# Patient Record
Sex: Female | Born: 1980 | Race: Black or African American | Hispanic: No | Marital: Single | Smoking: Never smoker
Health system: Southern US, Community
[De-identification: ages and names within clinical notes are randomized; demographics above are authoritative.]

## PROBLEM LIST (undated history)

## (undated) DIAGNOSIS — E119 Type 2 diabetes mellitus without complications: Secondary | ICD-10-CM

## (undated) DIAGNOSIS — I509 Heart failure, unspecified: Secondary | ICD-10-CM

## (undated) DIAGNOSIS — N183 Chronic kidney disease, stage 3 unspecified: Secondary | ICD-10-CM

## (undated) DIAGNOSIS — D649 Anemia, unspecified: Secondary | ICD-10-CM

## (undated) DIAGNOSIS — I10 Essential (primary) hypertension: Secondary | ICD-10-CM

## (undated) HISTORY — PX: BREAST SURGERY: SHX581

---

## 2016-06-24 HISTORY — PX: RENAL BIOPSY: SHX156

## 2018-05-24 ENCOUNTER — Other Ambulatory Visit: Payer: Self-pay

## 2018-05-24 ENCOUNTER — Inpatient Hospital Stay (HOSPITAL_COMMUNITY)
Admission: AD | Admit: 2018-05-24 | Discharge: 2018-05-30 | DRG: 291 | Disposition: A | Discharge status: Home / Self Care | Payer: BLUE CROSS/BLUE SHIELD | Source: Other Acute Inpatient Hospital | Attending: Internal Medicine | Admitting: Internal Medicine

## 2018-05-24 ENCOUNTER — Encounter (HOSPITAL_COMMUNITY): Payer: Self-pay

## 2018-05-24 DIAGNOSIS — N269 Renal sclerosis, unspecified: Secondary | ICD-10-CM | POA: Diagnosis present

## 2018-05-24 DIAGNOSIS — Z794 Long term (current) use of insulin: Secondary | ICD-10-CM | POA: Diagnosis not present

## 2018-05-24 DIAGNOSIS — E1322 Other specified diabetes mellitus with diabetic chronic kidney disease: Secondary | ICD-10-CM | POA: Diagnosis present

## 2018-05-24 DIAGNOSIS — M069 Rheumatoid arthritis, unspecified: Secondary | ICD-10-CM | POA: Diagnosis present

## 2018-05-24 DIAGNOSIS — D631 Anemia in chronic kidney disease: Secondary | ICD-10-CM | POA: Diagnosis present

## 2018-05-24 DIAGNOSIS — I130 Hypertensive heart and chronic kidney disease with heart failure and stage 1 through stage 4 chronic kidney disease, or unspecified chronic kidney disease: Secondary | ICD-10-CM | POA: Diagnosis present

## 2018-05-24 DIAGNOSIS — Z8249 Family history of ischemic heart disease and other diseases of the circulatory system: Secondary | ICD-10-CM

## 2018-05-24 DIAGNOSIS — N184 Chronic kidney disease, stage 4 (severe): Secondary | ICD-10-CM | POA: Diagnosis present

## 2018-05-24 DIAGNOSIS — R748 Abnormal levels of other serum enzymes: Secondary | ICD-10-CM | POA: Diagnosis present

## 2018-05-24 DIAGNOSIS — E1122 Type 2 diabetes mellitus with diabetic chronic kidney disease: Secondary | ICD-10-CM | POA: Diagnosis present

## 2018-05-24 DIAGNOSIS — I428 Other cardiomyopathies: Secondary | ICD-10-CM | POA: Diagnosis present

## 2018-05-24 DIAGNOSIS — L97521 Non-pressure chronic ulcer of other part of left foot limited to breakdown of skin: Secondary | ICD-10-CM | POA: Diagnosis present

## 2018-05-24 DIAGNOSIS — D649 Anemia, unspecified: Secondary | ICD-10-CM | POA: Diagnosis present

## 2018-05-24 DIAGNOSIS — E1165 Type 2 diabetes mellitus with hyperglycemia: Secondary | ICD-10-CM | POA: Diagnosis present

## 2018-05-24 DIAGNOSIS — Z833 Family history of diabetes mellitus: Secondary | ICD-10-CM

## 2018-05-24 DIAGNOSIS — Z79899 Other long term (current) drug therapy: Secondary | ICD-10-CM

## 2018-05-24 DIAGNOSIS — I5021 Acute systolic (congestive) heart failure: Secondary | ICD-10-CM | POA: Diagnosis present

## 2018-05-24 DIAGNOSIS — E785 Hyperlipidemia, unspecified: Secondary | ICD-10-CM | POA: Diagnosis present

## 2018-05-24 DIAGNOSIS — N049 Nephrotic syndrome with unspecified morphologic changes: Secondary | ICD-10-CM | POA: Diagnosis present

## 2018-05-24 DIAGNOSIS — Z91041 Radiographic dye allergy status: Secondary | ICD-10-CM

## 2018-05-24 DIAGNOSIS — I890 Lymphedema, not elsewhere classified: Secondary | ICD-10-CM | POA: Diagnosis present

## 2018-05-24 DIAGNOSIS — N179 Acute kidney failure, unspecified: Secondary | ICD-10-CM | POA: Diagnosis present

## 2018-05-24 DIAGNOSIS — K219 Gastro-esophageal reflux disease without esophagitis: Secondary | ICD-10-CM | POA: Diagnosis present

## 2018-05-24 DIAGNOSIS — I509 Heart failure, unspecified: Secondary | ICD-10-CM

## 2018-05-24 DIAGNOSIS — IMO0002 Reserved for concepts with insufficient information to code with codable children: Secondary | ICD-10-CM | POA: Insufficient documentation

## 2018-05-24 DIAGNOSIS — Z6841 Body Mass Index (BMI) 40.0 and over, adult: Secondary | ICD-10-CM | POA: Diagnosis not present

## 2018-05-24 DIAGNOSIS — Z713 Dietary counseling and surveillance: Secondary | ICD-10-CM | POA: Diagnosis not present

## 2018-05-24 DIAGNOSIS — S92919A Unspecified fracture of unspecified toe(s), initial encounter for closed fracture: Secondary | ICD-10-CM

## 2018-05-24 DIAGNOSIS — E039 Hypothyroidism, unspecified: Secondary | ICD-10-CM | POA: Diagnosis present

## 2018-05-24 DIAGNOSIS — I129 Hypertensive chronic kidney disease with stage 1 through stage 4 chronic kidney disease, or unspecified chronic kidney disease: Secondary | ICD-10-CM

## 2018-05-24 DIAGNOSIS — E8809 Other disorders of plasma-protein metabolism, not elsewhere classified: Secondary | ICD-10-CM | POA: Diagnosis present

## 2018-05-24 DIAGNOSIS — E1365 Other specified diabetes mellitus with hyperglycemia: Secondary | ICD-10-CM

## 2018-05-24 HISTORY — DX: Chronic kidney disease, stage 3 unspecified: N18.30

## 2018-05-24 HISTORY — DX: Type 2 diabetes mellitus without complications: E11.9

## 2018-05-24 HISTORY — DX: Chronic kidney disease, stage 3 (moderate): N18.3

## 2018-05-24 HISTORY — DX: Essential (primary) hypertension: I10

## 2018-05-24 LAB — BASIC METABOLIC PANEL
Anion gap: 9 (ref 5–15)
BUN: 33 mg/dL — ABNORMAL HIGH (ref 6–20)
CO2: 18 mmol/L — ABNORMAL LOW (ref 22–32)
Calcium: 8 mg/dL — ABNORMAL LOW (ref 8.9–10.3)
Chloride: 112 mmol/L — ABNORMAL HIGH (ref 98–111)
Creatinine, Ser: 3.05 mg/dL — ABNORMAL HIGH (ref 0.44–1.00)
GFR, EST AFRICAN AMERICAN: 22 mL/min — AB (ref >60)
GFR, EST NON AFRICAN AMERICAN: 19 mL/min — AB (ref >60)
Glucose, Bld: 96 mg/dL (ref 70–99)
Potassium: 4.6 mmol/L (ref 3.5–5.1)
SODIUM: 139 mmol/L (ref 135–145)

## 2018-05-24 LAB — GLUCOSE, CAPILLARY: Glucose-Capillary: 124 mg/dL — ABNORMAL HIGH (ref 70–99)

## 2018-05-24 LAB — TSH: TSH: 5.567 u[IU]/mL — ABNORMAL HIGH (ref 0.350–4.500)

## 2018-05-24 LAB — CBC WITH DIFFERENTIAL/PLATELET
Abs Immature Granulocytes: 0.01 10*3/uL (ref 0.00–0.07)
Basophils Absolute: 0 10*3/uL (ref 0.0–0.1)
Basophils Relative: 1 %
Eosinophils Absolute: 0.5 10*3/uL (ref 0.0–0.5)
Eosinophils Relative: 8 %
HCT: 28.9 % — ABNORMAL LOW (ref 36.0–46.0)
Hemoglobin: 8.8 g/dL — ABNORMAL LOW (ref 12.0–15.0)
IMMATURE GRANULOCYTES: 0 %
Lymphocytes Relative: 36 %
Lymphs Abs: 2.1 10*3/uL (ref 0.7–4.0)
MCH: 27.8 pg (ref 26.0–34.0)
MCHC: 30.4 g/dL (ref 30.0–36.0)
MCV: 91.2 fL (ref 80.0–100.0)
Monocytes Absolute: 0.4 10*3/uL (ref 0.1–1.0)
Monocytes Relative: 7 %
NEUTROS ABS: 2.7 10*3/uL (ref 1.7–7.7)
NRBC: 0 % (ref 0.0–0.2)
Neutrophils Relative %: 48 %
Platelets: 326 10*3/uL (ref 150–400)
RBC: 3.17 MIL/uL — ABNORMAL LOW (ref 3.87–5.11)
RDW: 14.8 % (ref 11.5–15.5)
WBC: 5.7 10*3/uL (ref 4.0–10.5)

## 2018-05-24 LAB — TROPONIN I

## 2018-05-24 MED ORDER — SODIUM CHLORIDE 0.9% FLUSH: 3.0000 mL | INTRAVENOUS | Status: DC | PRN

## 2018-05-24 MED ORDER — SODIUM CHLORIDE 0.9% FLUSH
3.0000 mL | Freq: Two times a day (BID) | INTRAVENOUS | Status: DC
  Administered 2018-05-24 – 2018-05-30 (×12): 3 mL via INTRAVENOUS

## 2018-05-24 MED ORDER — FUROSEMIDE 10 MG/ML IJ SOLN
20.0000 mg | Freq: Two times a day (BID) | INTRAMUSCULAR | Status: DC
  Administered 2018-05-25 – 2018-05-27 (×6): 20 mg via INTRAVENOUS
  Filled 2018-05-24 (×6): qty 2

## 2018-05-24 MED ORDER — ACETAMINOPHEN 325 MG PO TABS
650.0000 mg | ORAL_TABLET | ORAL | Status: DC | PRN
  Administered 2018-05-28 – 2018-05-29 (×3): 650 mg via ORAL
  Filled 2018-05-24 (×3): qty 2

## 2018-05-24 MED ORDER — ONDANSETRON HCL 4 MG/2ML IJ SOLN: 4.0000 mg | Freq: Four times a day (QID) | INTRAMUSCULAR | Status: DC | PRN

## 2018-05-24 MED ORDER — CARVEDILOL 3.125 MG PO TABS
3.1250 mg | ORAL_TABLET | Freq: Two times a day (BID) | ORAL | Status: DC
  Administered 2018-05-25 – 2018-05-26 (×3): 3.125 mg via ORAL
  Filled 2018-05-24 (×3): qty 1

## 2018-05-24 MED ORDER — INSULIN ASPART 100 UNIT/ML ~~LOC~~ SOLN: 0.0000 [IU] | Freq: Every day | SUBCUTANEOUS | Status: DC

## 2018-05-24 MED ORDER — INSULIN ASPART 100 UNIT/ML ~~LOC~~ SOLN
0.0000 [IU] | Freq: Three times a day (TID) | SUBCUTANEOUS | Status: DC
  Administered 2018-05-26 – 2018-05-28 (×4): 1 [IU] via SUBCUTANEOUS
  Administered 2018-05-29 (×2): 2 [IU] via SUBCUTANEOUS

## 2018-05-24 MED ORDER — HEPARIN SODIUM (PORCINE) 5000 UNIT/ML IJ SOLN
5000.0000 [IU] | Freq: Three times a day (TID) | INTRAMUSCULAR | Status: DC
  Administered 2018-05-24 – 2018-05-30 (×16): 5000 [IU] via SUBCUTANEOUS
  Filled 2018-05-24 (×16): qty 1

## 2018-05-24 MED ORDER — SODIUM CHLORIDE 0.9 % IV SOLN: 250.0000 mL | INTRAVENOUS | Status: DC | PRN

## 2018-05-24 NOTE — Progress Notes (Signed)
Patient admitted from [facility]. via [place] for chf and DM. Patient alert and oriented.  Patient oriented to room and call bell. Tele box # [id number] placed to patient, call and verified with Tele and [place]asha, NA.  On room air, no distress noted. Was not transported with 02.  MD notified of pt arrival.

## 2018-05-24 NOTE — H&P (Signed)
History and Physical   Carla Compton PJA:[id number] DOB: 1980-09-29 DOA: 05/24/2018  Referring MD/NP/PA: Benson Norway PCP: No primary care provider on file.    Patient coming from: [facility]  Chief Complaint: Notes of breath  HPI: Carla Compton is a 37 y.o. female with medical history significant of diabetes, hypertension, morbid obesity, chronic kidney disease stage III, who went to the ER at [facility] with complaint of 2 days of progressive shortness of breath, cough and lower extremity edema.  Associated with weakness.  Patient also has some chest heaviness.  Patient was seen and evaluated.  Her creatinine was normally in the twos at baseline but was up to 3.3 this time around.  Associated with new onset congestive heart failure it appears on chest x-ray.  Patient has not had any known cardiac history.  Her BNP was 3518.  She was also noted to have troponin of 0.29.  She has hemoglobin 8.8.  Patient's therefore was diagnosed with new onset CHF and worsening renal function.  With no cardiologist or nephrologist available there she was transferred here for treatment of new onset CHF and worsening renal function..  Review of Systems: As per HPI otherwise 10 point review of systems negative.    Past Medical History:  Diagnosis Date  . Benign essential HTN   . CKD (chronic kidney disease) stage 3, GFR 30-59 ml/min (HCC)   . Diabetes ([place])     History reviewed. No pertinent surgical history.   reports that she has never smoked. She has never used smokeless tobacco. She reports that she does not drink alcohol or use drugs.  Allergies  Allergen Reactions  . Contrast Media [Iodinated Diagnostic Agents]     No family history on file.   Prior to Admission medications   Not on File    Physical Exam: Vitals:   05/24/18 1812 05/24/18 2132  BP: (!) 150/99 (!) 152/87  Pulse: (!) 101 94  Resp: 20 18  Temp: 97.7 F (36.5 C) 98.4 F (36.9 C)  TempSrc: Oral Oral    SpO2: 100% 97%  Weight: 125.1 kg   Height: 5\' 2"  (1.575 m)       Constitutional: NAD, calm, comfortable, morbidly obese Vitals:   05/24/18 1812 05/24/18 2132  BP: (!) 150/99 (!) 152/87  Pulse: (!) 101 94  Resp: 20 18  Temp: 97.7 F (36.5 C) 98.4 F (36.9 C)  TempSrc: Oral Oral  SpO2: 100% 97%  Weight: 125.1 kg   Height: 5\' 2"  (1.575 m)    Eyes: PERRL, lids and conjunctivae normal ENMT: Mucous membranes are moist. Posterior pharynx clear of any exudate or lesions.Normal dentition.  Neck: normal, supple, no masses, no thyromegaly Respiratory: Bilateral lower extremity crackles. Normal respiratory effort. No accessory muscle use.  Cardiovascular: Regular rate and rhythm, no murmurs / rubs / gallops. 1+extremity edema. 2+ pedal pulses. No carotid bruits.  Abdomen: no tenderness, no masses palpated. No hepatosplenomegaly. Bowel sounds positive.  Musculoskeletal: no clubbing / cyanosis. No joint deformity upper and lower extremities. Good ROM, no contractures. Normal muscle tone.  Skin: no rashes, lesions, ulcers. No induration Neurologic: CN 2-12 grossly intact. Sensation intact, DTR normal. Strength 5/5 in all 4.  Psychiatric: Normal judgment and insight. Alert and oriented x 3. Normal mood.     Labs on Admission: I have personally reviewed following labs and imaging studies  CBC: Recent Labs  Lab 05/24/18 1928  WBC 5.7  NEUTROABS 2.7  HGB 8.8*  HCT 28.9*  MCV 91.2  PLT 161   Basic Metabolic Panel: Recent Labs  Lab 05/24/18 1928  NA 139  K 4.6  CL 112*  CO2 18*  GLUCOSE 96  BUN 33*  CREATININE 3.05*  CALCIUM 8.0*   GFR: Estimated Creatinine Clearance: 32.6 mL/min (A) (by C-G formula based on SCr of 3.05 mg/dL (H)). Liver Function Tests: No results for input(s): AST, ALT, ALKPHOS, BILITOT, PROT, ALBUMIN in the last 168 hours. No results for input(s): LIPASE, AMYLASE in the last 168 hours. No results for input(s): AMMONIA in the last 168  hours. Coagulation Profile: No results for input(s): INR, PROTIME in the last 168 hours. Cardiac Enzymes: Recent Labs  Lab 05/24/18 1928  TROPONINI <0.03   BNP (last 3 results) No results for input(s): PROBNP in the last 8760 hours. HbA1C: No results for input(s): HGBA1C in the last 72 hours. CBG: Recent Labs  Lab 05/24/18 2136  GLUCAP 124*   Lipid Profile: No results for input(s): CHOL, HDL, LDLCALC, TRIG, CHOLHDL, LDLDIRECT in the last 72 hours. Thyroid Function Tests: Recent Labs    05/24/18 1928  TSH 5.567*   Anemia Panel: No results for input(s): VITAMINB12, FOLATE, FERRITIN, TIBC, IRON, RETICCTPCT in the last 72 hours. Urine analysis: No results found for: COLORURINE, APPEARANCEUR, LABSPEC, PHURINE, GLUCOSEU, HGBUR, BILIRUBINUR, KETONESUR, PROTEINUR, UROBILINOGEN, NITRITE, LEUKOCYTESUR Sepsis Labs: @LABRCNTIP (procalcitonin:4,lacticidven:4) )No results found for this or any previous visit (from the past 240 hour(s)).   Radiological Exams on Admission: No results found.    Assessment/Plan Active Problems:   CHF (congestive heart failure) (HCC)   Diabetes mellitus without complication (HCC)   Benign essential HTN   ARF (acute renal failure) (HCC)   Normocytic anemia   Acute CHF (congestive heart failure) ([place])     #1 new onset congestive heart failure: Patient will be admitted to telemetry bed.  With her renal function being elevated will be careful with IV diuresis.  Get echocardiogram to evaluate her LV EF.  Consider cardiac consultation after results are obtained.  Cycle her enzymes.  #2 diabetes: Sliding scale insulin with home regimen.  #3 hypertension: Continue with diuresis.  Add beta-blockers.  Resume some home medications nontoxic.  #4 acute on chronic kidney failure: Patient reported known history of chronic kidney disease stage III.  We will therefore not walk it off for now.  Renal function will be monitored if it got worse we will get  nephrology consult.  #5 morbid obesity: Dietary counseling provided.  #6 normocytic anemia: Hemoglobin around 8.8.  Probably due to chronic disease.   DVT prophylaxis: Heparin Code Status: Full code Family Communication: Mother at bedside Disposition Plan: Home most likely Consults called: None but cardiology can be called in the morning Admission status: Inpatient  Severity of Illness: The appropriate patient status for this patient is INPATIENT. Inpatient status is judged to be reasonable and necessary in order to provide the required intensity of service to ensure the patient's safety. The patient's presenting symptoms, physical exam findings, and initial radiographic and laboratory data in the context of their chronic comorbidities is felt to place them at high risk for further clinical deterioration. Furthermore, it is not anticipated that the patient will be medically stable for discharge from the hospital within 2 midnights of admission. The following factors support the patient status of inpatient.   " The patient's presenting symptoms include loss of breath and weakness. " The worrisome physical exam findings include bilateral lung crackles. " The initial radiographic and laboratory data are worrisome because of chest  x-ray showing pulmonary edema. " The chronic co-morbidities include diabetes with hypertension.   * I certify that at the point of admission it is my clinical judgment that the patient will require inpatient hospital care spanning beyond 2 midnights from the point of admission due to high intensity of service, high risk for further deterioration and high frequency of surveillance required.Barbette Merino MD [facility] Pager [phone]  If 7PM-7AM, please contact night-coverage [email or web address] Password [email or web address]  05/24/2018, 11:20 PM

## 2018-05-25 ENCOUNTER — Other Ambulatory Visit (HOSPITAL_COMMUNITY): Payer: Self-pay

## 2018-05-25 ENCOUNTER — Inpatient Hospital Stay (HOSPITAL_COMMUNITY): Payer: BLUE CROSS/BLUE SHIELD

## 2018-05-25 ENCOUNTER — Encounter (HOSPITAL_COMMUNITY): Payer: Self-pay | Admitting: Cardiology

## 2018-05-25 DIAGNOSIS — I10 Essential (primary) hypertension: Secondary | ICD-10-CM

## 2018-05-25 DIAGNOSIS — I5031 Acute diastolic (congestive) heart failure: Secondary | ICD-10-CM

## 2018-05-25 DIAGNOSIS — I509 Heart failure, unspecified: Secondary | ICD-10-CM

## 2018-05-25 DIAGNOSIS — E119 Type 2 diabetes mellitus without complications: Secondary | ICD-10-CM

## 2018-05-25 DIAGNOSIS — N179 Acute kidney failure, unspecified: Secondary | ICD-10-CM

## 2018-05-25 LAB — COMPREHENSIVE METABOLIC PANEL
ALT: 18 U/L (ref 0–44)
AST: 20 U/L (ref 15–41)
Albumin: 2.2 g/dL — ABNORMAL LOW (ref 3.5–5.0)
Alkaline Phosphatase: 136 U/L — ABNORMAL HIGH (ref 38–126)
Anion gap: 7 (ref 5–15)
BUN: 34 mg/dL — ABNORMAL HIGH (ref 6–20)
CHLORIDE: 110 mmol/L (ref 98–111)
CO2: 21 mmol/L — ABNORMAL LOW (ref 22–32)
Calcium: 8 mg/dL — ABNORMAL LOW (ref 8.9–10.3)
Creatinine, Ser: 3.18 mg/dL — ABNORMAL HIGH (ref 0.44–1.00)
GFR calc Af Amer: 21 mL/min — ABNORMAL LOW (ref >60)
GFR calc non Af Amer: 18 mL/min — ABNORMAL LOW (ref >60)
Glucose, Bld: 85 mg/dL (ref 70–99)
Potassium: 4.8 mmol/L (ref 3.5–5.1)
Sodium: 138 mmol/L (ref 135–145)
Total Bilirubin: 0.5 mg/dL (ref 0.3–1.2)
Total Protein: 6.5 g/dL (ref 6.5–8.1)

## 2018-05-25 LAB — GLUCOSE, CAPILLARY
Glucose-Capillary: 87 mg/dL (ref 70–99)
Glucose-Capillary: 108 mg/dL — ABNORMAL HIGH (ref 70–99)
Glucose-Capillary: 98 mg/dL (ref 70–99)
Glucose-Capillary: 107 mg/dL — ABNORMAL HIGH (ref 70–99)

## 2018-05-25 LAB — ECHOCARDIOGRAM COMPLETE
Height: 62 in
Weight: 4385.6 oz

## 2018-05-25 LAB — TROPONIN I

## 2018-05-25 MED ORDER — HYDRALAZINE HCL 25 MG PO TABS
25.0000 mg | ORAL_TABLET | Freq: Three times a day (TID) | ORAL | Status: DC
  Administered 2018-05-25 – 2018-05-28 (×9): 25 mg via ORAL
  Filled 2018-05-25 (×9): qty 1

## 2018-05-25 MED ORDER — ROSUVASTATIN CALCIUM 20 MG PO TABS
40.0000 mg | ORAL_TABLET | Freq: Every day | ORAL | Status: DC
  Administered 2018-05-25 – 2018-05-29 (×5): 40 mg via ORAL
  Filled 2018-05-25 (×5): qty 2

## 2018-05-25 MED ORDER — DM-GUAIFENESIN ER 30-600 MG PO TB12
1.0000 | ORAL_TABLET | Freq: Two times a day (BID) | ORAL | Status: DC
  Administered 2018-05-25 – 2018-05-30 (×11): 1 via ORAL
  Filled 2018-05-25 (×11): qty 1

## 2018-05-25 MED ORDER — CALCITRIOL 0.25 MCG PO CAPS
0.2500 ug | ORAL_CAPSULE | ORAL | Status: DC
  Administered 2018-05-25 – 2018-05-29 (×3): 0.25 ug via ORAL
  Filled 2018-05-25 (×3): qty 1

## 2018-05-25 MED ORDER — ALBUTEROL SULFATE (2.5 MG/3ML) 0.083% IN NEBU: 3.0000 mL | INHALATION_SOLUTION | RESPIRATORY_TRACT | Status: DC | PRN

## 2018-05-25 NOTE — Progress Notes (Signed)
PROGRESS NOTE  Carla Compton  HER:[id number] DOB: 1980/10/24 DOA: 05/24/2018 PCP: No primary care provider on file.   Brief Narrative: Carla Compton is a 37 y.o. female with a history of IDDM, morbid obesity, HTN, and stage III CKD who presented to [facility] ED with progressive swelling of the body, mostly legs for weeks, and dyspnea worsening over a couple days. Per records, creatinine was above baseline (?2-3) at 3.3, and CXR was consistent with CHF, felt to be a new diagnosis. BNP 3518, troponin 0.29, hgb 8.8. She was transferred to [facility] for cardiology consultation.   Assessment & Plan: Active Problems:   CHF (congestive heart failure) (HCC)   Diabetes mellitus without complication (HCC)   Benign essential HTN   ARF (acute renal failure) (HCC)   Normocytic anemia   Acute CHF (congestive heart failure) (HCC) Acute CHF:  - Await echocardiogram to help direct care - Cardiology consulted - Continue diuresis as ordered, had good UOP with 20mg  IV of lasix (on home torsemide and once weekly metolazone), strict I/O, daily weights.  - Started beta blocker, not ACE/ARB/ARNI with renal failure - Anticipate difficult balance with renal failure and CHF, may require nephrology consultation.   AKI on stage III CKD: Followed by nephrology who has discussed eventual HD, though no indications currently.  - Monitor BMP with diuresis - Avoid nephrotoxins as able. - Needs aggressive control of HTN, DM, obesity - Continue calcitriol  IDDM: Dx in 20's, initially on po meds, now on basal-bolus insulin.  - Check HbA1c - Continue bolus insulin with sliding scale qAC/HS - Restart home statin  HTN:  - Resumed home hydralazine since BPs up, defer to cardiology for further management. - Added coreg - Lasix as above  Morbid obesity: BMI 50. - Dietitian consulted  Anemia of chronic disease: No active bleeding noted. - Monitor hgb.   Elevated troponin: Reported at [facility]. Cycled and  undetectable here with no chest pain. Nonischemic ECG (diffuse low voltage t waves, no STE/D on my personal review).  - Per cardiology.   Elevated TSH: Mild, 5.567 - Check free T3, T4  Alkaline phosphatase elevation: Modest, suspect FLD with DM, obesity.  - Monitor  DVT prophylaxis: Heparin Code Status: Full Family Communication: None at bedside Disposition Plan: Uncertain  Consultants:   Cardiology  Procedures:   Echocardiogram ordered  Antimicrobials:  None   Subjective: Feels swelling has gone down some, still very swollen mostly in legs. Short of breath with exertion which is not baseline. No chest pain.   Objective: Vitals:   05/25/18 0051 05/25/18 0100 05/25/18 0617 05/25/18 1245  BP: (!) 167/99  (!) 170/99 (!) 145/75  Pulse: 98  93 94  Resp: 18  18 20   Temp: 98.1 F (36.7 C)  98.7 F (37.1 C) 98.6 F (37 C)  TempSrc: Oral  Oral Oral  SpO2: 94%  96% 96%  Weight:  124.3 kg    Height:        Intake/Output Summary (Last 24 hours) at 05/25/2018 1336 Last data filed at 05/25/2018 0600 Gross per 24 hour  Intake 240 ml  Output 1650 ml  Net -1410 ml   Filed Weights   05/24/18 1812 05/25/18 0100  Weight: 125.1 kg 124.3 kg    Gen: Pleasant, obese female in no distress Pulm: Non-labored tachypnea. Crackles at bases.  CV: Regular rate and rhythm. No murmur, rub, or gallop. Uncertain if JVD, +pitting LE > UE edema. GI: Abdomen soft, non-tender, non-distended, with normoactive bowel sounds. No  organomegaly or masses felt. Ext: Warm, no deformities Skin: No rashes, lesions or ulcers Neuro: Alert and oriented. No focal neurological deficits. Psych: Judgement and insight appear normal. Mood & affect appropriate.   Data Reviewed: I have personally reviewed following labs and imaging studies  CBC: Recent Labs  Lab 05/24/18 1928  WBC 5.7  NEUTROABS 2.7  HGB 8.8*  HCT 28.9*  MCV 91.2  PLT 510   Basic Metabolic Panel: Recent Labs  Lab 05/24/18 1928  05/25/18 0618  NA 139 138  K 4.6 4.8  CL 112* 110  CO2 18* 21*  GLUCOSE 96 85  BUN 33* 34*  CREATININE 3.05* 3.18*  CALCIUM 8.0* 8.0*   GFR: Estimated Creatinine Clearance: 31.1 mL/min (A) (by C-G formula based on SCr of 3.18 mg/dL (H)). Liver Function Tests: Recent Labs  Lab 05/25/18 0618  AST 20  ALT 18  ALKPHOS 136*  BILITOT 0.5  PROT 6.5  ALBUMIN 2.2*   No results for input(s): LIPASE, AMYLASE in the last 168 hours. No results for input(s): AMMONIA in the last 168 hours. Coagulation Profile: No results for input(s): INR, PROTIME in the last 168 hours. Cardiac Enzymes: Recent Labs  Lab 05/24/18 1928 05/25/18 0009 05/25/18 0618  TROPONINI <0.03 <0.03 <0.03   BNP (last 3 results) No results for input(s): PROBNP in the last 8760 hours. HbA1C: No results for input(s): HGBA1C in the last 72 hours. CBG: Recent Labs  Lab 05/24/18 2136 05/25/18 0740 05/25/18 1247  GLUCAP 124* 87 108*   Lipid Profile: No results for input(s): CHOL, HDL, LDLCALC, TRIG, CHOLHDL, LDLDIRECT in the last 72 hours. Thyroid Function Tests: Recent Labs    05/24/18 1928  TSH 5.567*   Anemia Panel: No results for input(s): VITAMINB12, FOLATE, FERRITIN, TIBC, IRON, RETICCTPCT in the last 72 hours. Urine analysis: No results found for: COLORURINE, APPEARANCEUR, LABSPEC, PHURINE, GLUCOSEU, HGBUR, BILIRUBINUR, KETONESUR, PROTEINUR, UROBILINOGEN, NITRITE, LEUKOCYTESUR No results found for this or any previous visit (from the past 240 hour(s)).    Radiology Studies: Dg Chest 2 View  Result Date: 05/25/2018 CLINICAL DATA:  Congestive heart failure. EXAM: CHEST - 2 VIEW COMPARISON:  None. FINDINGS: Heart size is normal. There is pulmonary venous hypertension with mild interstitial edema, fluid in the fissures and a small amount of fluid in the posterior costophrenic angles. No consolidation or collapse. No significant bone finding. IMPRESSION: Congestive heart failure pattern with pulmonary  venous hypertension, interstitial edema and a small pleural fluid. Electronically Signed   By: Nelson Chimes M.D.   On: 05/25/2018 07:05    Scheduled Meds: . carvedilol  3.125 mg Oral BID WC  . furosemide  20 mg Intravenous BID  . heparin  5,000 Units Subcutaneous Q8H  . insulin aspart  0-5 Units Subcutaneous QHS  . insulin aspart  0-9 Units Subcutaneous TID WC  . sodium chloride flush  3 mL Intravenous Q12H   Continuous Infusions: . sodium chloride       LOS: 1 day   Time spent: 25 minutes.  Patrecia Pour, MD [facility] [email or web address] Password [email or web address] 05/25/2018, 1:36 PM

## 2018-05-25 NOTE — Progress Notes (Signed)
*  PRELIMINARY RESULTS* Echocardiogram 2D Echocardiogram has been performed. Definity was offered. Patient wanted reader to determine if it was needed or not?  Carla Compton 05/25/2018, 3:55 PM

## 2018-05-25 NOTE — Care Management Note (Signed)
Case Management Note  Patient Details  Name: Carla Compton MRN: [id number] Date of Birth: 1980-10-03  Subjective/Objective:    CHF               Action/Plan: Patient is independent of all of her ADL's; works full time at [employer]; PCP - [facility] in [place], [place]; has private insurance with [employer]; pharmacy of choice is [facility] on [address] in [place], [place]; pt is agreeable to have the Dietitian talk to her about preparing meals; she has scales at home; CM talked to her about the importance of weighing herself daily. CM will continue to follow for progression of care.  Expected Discharge Date:    possibly 05/28/2018              Expected Discharge Plan:  Home/Self Care  In-House Referral:   Dietitian   Discharge planning Services  CM Consult  Choice offered to:  Patient  Status of Service:  In process, will continue to follow  Sherrilyn Rist [phone] 05/25/2018, 10:21 AM

## 2018-05-25 NOTE — Plan of Care (Signed)

## 2018-05-25 NOTE — Consult Note (Signed)
Cardiology Consultation:   Patient ID: Carla Compton MRN: [id number]; DOB: [birth year]  Admit date: 05/24/2018 Date of Consult: 05/25/2018  Primary Care Provider: No primary care provider on file. Primary Cardiologist: Pixie Casino, MD new Primary Electrophysiologist:  None    Patient Profile:   Carla Compton is a 37 y.o. female with a hx of DM, HTN, RA,  morbid obesity, GERD, lymphedema, CKD-3 who is being seen today for the evaluation of chf at the request of Dr. Bonner Puna.  History of Present Illness:   Carla Compton has hx of DM-2 insulin dependent, RA, HTN, HLD, GERD, lymphedema, CKD-3 and morbid obesity. Pt presented to ER 05/24/18 with progressive dyspnea along with cough and lower ext. Edema.  She did have some chest heaviness.  She had new CHF, Cr was elevated at 3.3 and notmally in the twos at baseline.  Her Pro-BNP was 3518, and troponin T there was 0.29.  Her Hgb was 8.8 --she was transferred to [facility] for further management.    Here her troponins are <0.03 X 3, Cr 3.05, CO2 18, on arrival and today Cr 3.18 and CO2 now 21.  LFTs normal.  Hgb 8.8.  TSH 5.567   EKG SR at 97 and non specific T wave abnormalities.  I personally reviewed.  Telemetry:  Telemetry was personally reviewed and demonstrates:  SR    CXR 2 V with Congestive heart failure pattern with pulmonary venous hypertension, interstitial edema and a small pleural fluid. Echo is pending.   Placed on lasix 20 mg BID and she is negative 1140 and wt down from 275 lbs to 273 lbs.    Home meds of hydralazine 25 mg TID, metolazone 2.5 weekly 30 min prior to torsemide 2 tabs BID and she does not know dose.  And crestor for HLD along with insulin.    Her Hgb in August was 9 but a year ago was normal.      Past Medical History:  Diagnosis Date  . Benign essential HTN   . CKD (chronic kidney disease) stage 3, GFR 30-59 ml/min (HCC)   . Diabetes ([place])     History reviewed. No pertinent surgical history.   Home  Medications:  Prior to Admission medications   Medication Sig Start Date End Date Taking? Authorizing Provider  acetaminophen (TYLENOL) 500 MG tablet Take 1,000 mg by mouth as needed for mild pain or headache.   Yes [provider]  calcitRIOL (ROCALTROL) 0.25 MCG capsule Take 0.25 mcg by mouth 3 (three) times a week. Monday, Wednesday, Friday 04/20/18  Yes [provider]  hydrALAZINE (APRESOLINE) 25 MG tablet Take 25 mg by mouth 3 (three) times daily. 01/22/18  Yes [provider]  Insulin Glargine (LANTUS SOLOSTAR) 100 UNIT/ML Solostar Pen Inject 10-100 Units into the skin daily. Based on BS sliding scale per Patient   Yes [provider]  insulin lispro (HUMALOG) 100 UNIT/ML injection Inject 5 Units into the skin See admin instructions. Taking 5 units twice daily before and after a meal daily   Yes [provider]  metolazone (ZAROXOLYN) 2.5 MG tablet Take 2.5 mg by mouth See admin instructions. Take one tablet once weekly 30 minutes prior to dose of torsemide in th AM 04/30/18  Yes [provider]  PROAIR HFA 108 (90 Base) MCG/ACT inhaler Inhale 2 puffs into the lungs every 4 (four) hours as needed for wheezing. 02/24/18  Yes [provider]  rosuvastatin (CRESTOR) 40 MG tablet Take 40 mg by mouth at  bedtime. 03/23/18  Yes [provider]    Inpatient Medications: Scheduled Meds: . calcitRIOL  0.25 mcg Oral Once per day on Mon Wed Fri  . carvedilol  3.125 mg Oral BID WC  . dextromethorphan-guaiFENesin  1 tablet Oral BID  . furosemide  20 mg Intravenous BID  . heparin  5,000 Units Subcutaneous Q8H  . hydrALAZINE  25 mg Oral TID  . insulin aspart  0-5 Units Subcutaneous QHS  . insulin aspart  0-9 Units Subcutaneous TID WC  . rosuvastatin  40 mg Oral QHS  . sodium chloride flush  3 mL Intravenous Q12H   Continuous Infusions: . sodium chloride     PRN Meds: sodium chloride, acetaminophen, albuterol, ondansetron (ZOFRAN)  IV, sodium chloride flush  Allergies:    Allergies  Allergen Reactions  . Contrast Media [Iodinated Diagnostic Agents]     Social History:   Social History   Socioeconomic History  . Marital status: Single    Spouse name: Not on file  . Number of children: Not on file  . Years of education: Not on file  . Highest education level: Not on file  Occupational History  . Not on file  Social Needs  . Financial resource strain: Not on file  . Food insecurity:    Worry: Not on file    Inability: Not on file  . Transportation needs:    Medical: Not on file    Non-medical: Not on file  Tobacco Use  . Smoking status: Never Smoker  . Smokeless tobacco: Never Used  Substance and Sexual Activity  . Alcohol use: Never    Frequency: Never  . Drug use: Never  . Sexual activity: Not on file  Lifestyle  . Physical activity:    Days per week: Not on file    Minutes per session: Not on file  . Stress: Not on file  Relationships  . Social connections:    Talks on phone: Not on file    Gets together: Not on file    Attends religious service: Not on file    Active member of club or organization: Not on file    Attends meetings of clubs or organizations: Not on file    Relationship status: Not on file  . Intimate partner violence:    Fear of current or ex partner: Not on file    Emotionally abused: Not on file    Physically abused: Not on file    Forced sexual activity: Not on file  Other Topics Concern  . Not on file  Social History Narrative  . Not on file    Family History:    Family History  Problem Relation Age of Onset  . Mitral valve prolapse Mother   . Hypertension Mother   . Hypertension Father   . Diabetes Father   . Diabetes Maternal Grandmother      ROS:  Please see the history of present illness.  General:no colds or fevers, no weight changes chronic lower ext edema Skin:no rashes or ulcers HEENT:no blurred vision, no congestion CV:see HPI PUL:see  HPI GI:no diarrhea constipation or melena, no indigestion GU:no hematuria, no dysuria MS:+ joint pain with RA, no claudication Neuro:no syncope, no lightheadedness Endo:+ diabetes, no thyroid disease  All other ROS reviewed and negative.     Physical Exam/Data:   Vitals:   05/25/18 0051 05/25/18 0100 05/25/18 0617 05/25/18 1245  BP: (!) 167/99  (!) 170/99 (!) 145/75  Pulse: 98  93 94  Resp:  18  18 20   Temp: 98.1 F (36.7 C)  98.7 F (37.1 C) 98.6 F (37 C)  TempSrc: Oral  Oral Oral  SpO2: 94%  96% 96%  Weight:  124.3 kg    Height:        Intake/Output Summary (Last 24 hours) at 05/25/2018 1533 Last data filed at 05/25/2018 1452 Gross per 24 hour  Intake 510 ml  Output 1650 ml  Net -1140 ml   Filed Weights   05/24/18 1812 05/25/18 0100  Weight: 125.1 kg 124.3 kg   Body mass index is 50.13 kg/m.  General:  Well nourished, well developed, in no acute distress HEENT: normal Lymph: no adenopathy Neck: no JVD at 30 degrees Endocrine:  No thryomegaly Vascular: No carotid bruits; pedal pulses 1+ bilaterally  Cardiac:  normal S1, S2; RRR; no murmur gallup rub or click Lungs:  clear to auscultation bilaterally, ant., no wheezing, rhonchi or rales  Abd: obese,soft, nontender, no hepatomegaly  Ext: 2+ lower ext edema with 1+ at hips  Musculoskeletal:  No deformities, BUE and BLE strength normal and equal Skin: warm and dry  Neuro:  CNs 2-12 intact, no focal abnormalities noted Psych:  Normal affect   Relevant CV Studies: Echo pending  Laboratory Data:  Chemistry Recent Labs  Lab 05/24/18 1928 05/25/18 0618  NA 139 138  K 4.6 4.8  CL 112* 110  CO2 18* 21*  GLUCOSE 96 85  BUN 33* 34*  CREATININE 3.05* 3.18*  CALCIUM 8.0* 8.0*  GFRNONAA 19* 18*  GFRAA 22* 21*  ANIONGAP 9 7    Recent Labs  Lab 05/25/18 0618  PROT 6.5  ALBUMIN 2.2*  AST 20  ALT 18  ALKPHOS 136*  BILITOT 0.5   Hematology Recent Labs  Lab 05/24/18 1928  WBC 5.7  RBC 3.17*  HGB  8.8*  HCT 28.9*  MCV 91.2  MCH 27.8  MCHC 30.4  RDW 14.8  PLT 326   Cardiac Enzymes Recent Labs  Lab 05/24/18 1928 05/25/18 0009 05/25/18 0618  TROPONINI <0.03 <0.03 <0.03   No results for input(s): TROPIPOC in the last 168 hours.  BNPNo results for input(s): BNP, PROBNP in the last 168 hours.  DDimer No results for input(s): DDIMER in the last 168 hours.  Radiology/Studies:  Dg Chest 2 View  Result Date: 05/25/2018 CLINICAL DATA:  Congestive heart failure. EXAM: CHEST - 2 VIEW COMPARISON:  None. FINDINGS: Heart size is normal. There is pulmonary venous hypertension with mild interstitial edema, fluid in the fissures and a small amount of fluid in the posterior costophrenic angles. No consolidation or collapse. No significant bone finding. IMPRESSION: Congestive heart failure pattern with pulmonary venous hypertension, interstitial edema and a small pleural fluid. Electronically Signed   By: Nelson Chimes M.D.   On: 05/25/2018 07:05    Assessment and Plan:   1. Acute CHF, echo pending, was more sudden SOB yesterday.  She is neg 1 L but continues with edema but SOB is improved.  May need to increase diuretics was on 2 tabs torsemide BID, unknown dose. Dr. Debara Pickett to see. 2. Elevated troponin T all other troponins neg. 3. DM per IM 4. RA per IM 5. HTN  Elevated to 170/99 to 145/75.        For questions or updates, please contact [place] Please consult [email or web address] for contact info under     Signed, Cecilie Kicks, NP  05/25/2018 3:33 PM

## 2018-05-26 DIAGNOSIS — N184 Chronic kidney disease, stage 4 (severe): Secondary | ICD-10-CM

## 2018-05-26 DIAGNOSIS — E1322 Other specified diabetes mellitus with diabetic chronic kidney disease: Secondary | ICD-10-CM

## 2018-05-26 DIAGNOSIS — I5021 Acute systolic (congestive) heart failure: Secondary | ICD-10-CM

## 2018-05-26 DIAGNOSIS — E1365 Other specified diabetes mellitus with hyperglycemia: Secondary | ICD-10-CM

## 2018-05-26 LAB — BASIC METABOLIC PANEL
Anion gap: 8 (ref 5–15)
BUN: 35 mg/dL — ABNORMAL HIGH (ref 6–20)
CO2: 21 mmol/L — ABNORMAL LOW (ref 22–32)
CREATININE: 3.28 mg/dL — AB (ref 0.44–1.00)
Calcium: 8 mg/dL — ABNORMAL LOW (ref 8.9–10.3)
Chloride: 107 mmol/L (ref 98–111)
GFR calc Af Amer: 20 mL/min — ABNORMAL LOW (ref >60)
GFR calc non Af Amer: 17 mL/min — ABNORMAL LOW (ref >60)
Glucose, Bld: 99 mg/dL (ref 70–99)
Potassium: 4.8 mmol/L (ref 3.5–5.1)
Sodium: 136 mmol/L (ref 135–145)

## 2018-05-26 LAB — HEMOGLOBIN A1C
Hgb A1c MFr Bld: 7.2 % — ABNORMAL HIGH (ref 4.8–5.6)
Mean Plasma Glucose: 159.94 mg/dL

## 2018-05-26 LAB — PROTEIN / CREATININE RATIO, URINE
Creatinine, Urine: 61.55 mg/dL
Protein Creatinine Ratio: 10.4 mg/mg{Cre} — ABNORMAL HIGH (ref 0.00–0.15)
Total Protein, Urine: 640 mg/dL

## 2018-05-26 LAB — GLUCOSE, CAPILLARY
Glucose-Capillary: 80 mg/dL (ref 70–99)
Glucose-Capillary: 134 mg/dL — ABNORMAL HIGH (ref 70–99)
Glucose-Capillary: 96 mg/dL (ref 70–99)
Glucose-Capillary: 136 mg/dL — ABNORMAL HIGH (ref 70–99)

## 2018-05-26 LAB — T4, FREE: Free T4: 0.71 ng/dL — ABNORMAL LOW (ref 0.82–1.77)

## 2018-05-26 MED ORDER — CARVEDILOL 6.25 MG PO TABS
6.2500 mg | ORAL_TABLET | ORAL | Status: AC
  Administered 2018-05-26: 6.25 mg via ORAL
  Filled 2018-05-26: qty 1

## 2018-05-26 MED ORDER — CARVEDILOL 12.5 MG PO TABS
12.5000 mg | ORAL_TABLET | Freq: Two times a day (BID) | ORAL | Status: DC
  Administered 2018-05-26 – 2018-05-30 (×8): 12.5 mg via ORAL
  Filled 2018-05-26 (×8): qty 1

## 2018-05-26 NOTE — Progress Notes (Signed)
Bladder Scan 0 ml

## 2018-05-26 NOTE — Consult Note (Signed)
[name]  HISTORY AND PHYSICAL  Carla Compton is an 37 y.o. female.    Chief Complaint:  SOB and weight gain  HPI: Pt is a 35F with a PMH significant for HTN, HLD, RA, morbid obesity, DM, and CKD with baseline creatinine 2.11 as of 01/2018 who presented with increased LE edema, SOB and weight gain.  Was found to have an acute CHF exacerbation and have AoCKD with creatinine of 3.  We are asked to see to help with management of CKD in the setting of CHF exacerbation.    Since arriving, pt has received Lasix 20 IV BID with 1.6L urine output daily.  Was on torsemide and aldactone as OP.  Does not appear to have been on ACEi/ARB.  TTE with EF 45-50% and G2DD.  Cr 3.0 on admission 12/1, now is 3.2 today.   Weights are decreasing.  She is feeling better.      PMH: Past Medical History:  Diagnosis Date  . Benign essential HTN   . CKD (chronic kidney disease) stage 3, GFR 30-59 ml/min (HCC)   . Diabetes ([place])    PSH: History reviewed. No pertinent surgical history.   Past Medical History:  Diagnosis Date  . Benign essential HTN   . CKD (chronic kidney disease) stage 3, GFR 30-59 ml/min (HCC)   . Diabetes ([place])     Medications:   Scheduled: . calcitRIOL  0.25 mcg Oral Once per day on Mon Wed Fri  . carvedilol  12.5 mg Oral BID WC  . dextromethorphan-guaiFENesin  1 tablet Oral BID  . furosemide  20 mg Intravenous BID  . heparin  5,000 Units Subcutaneous Q8H  . hydrALAZINE  25 mg Oral TID  . insulin aspart  0-5 Units Subcutaneous QHS  . insulin aspart  0-9 Units Subcutaneous TID WC  . rosuvastatin  40 mg Oral QHS  . sodium chloride flush  3 mL Intravenous Q12H    Medications Prior to Admission  Medication Sig Dispense Refill  . acetaminophen (TYLENOL) 500 MG tablet Take 1,000 mg by mouth as needed for mild pain or headache.    . albuterol (PROAIR HFA) 108 (90 Base) MCG/ACT inhaler Inhale 2 puffs into the lungs every 4 (four) hours as needed for wheezing or  shortness of breath.    . hydrALAZINE (APRESOLINE) 25 MG tablet Take 25 mg by mouth 3 (three) times daily.    . metolazone (ZAROXOLYN) 2.5 MG tablet Take 2.5 mg by mouth See admin instructions. Take one tablet once weekly 30 minutes prior to dose of torsemide in th AM  0  . rosuvastatin (CRESTOR) 40 MG tablet Take 40 mg by mouth at bedtime.  1    ALLERGIES:   Allergies  Allergen Reactions  . Contrast Media [Iodinated Diagnostic Agents]     FAM HX: Family History  Problem Relation Age of Onset  . Mitral valve prolapse Mother   . Hypertension Mother   . Hypertension Father   . Diabetes Father   . Diabetes Maternal Grandmother     Social History:   reports that she has never smoked. She has never used smokeless tobacco. She reports that she does not drink alcohol or use drugs.  ROS: ROS: all other systems reviewed and are negative except as per HPI  Blood pressure (!) 171/109, pulse 90, temperature 98.3 F (36.8 C), temperature source Oral, resp. rate 20, height 5\' 2"  (1.575 m), weight 123.2 kg, last menstrual period 04/29/2018, SpO2 99 %. PHYSICAL EXAM: Physical  Exam  GEN morbidly obese, NAD HEENT EOMI PERRL NECK thick neck, unable to appreciate JVD PULM normal WOB, muffled at bases CV RRR no m/r/g ABD obese EXT 3+ LE edema NEURO AAO x 3 nonfocal    Results for orders placed or performed during the hospital encounter of 05/24/18 (from the past 48 hour(s))  CBC WITH DIFFERENTIAL     Status: Abnormal   Collection Time: 05/24/18  7:28 PM  Result Value Ref Range   WBC 5.7 4.0 - 10.5 K/uL   RBC 3.17 (L) 3.87 - 5.11 MIL/uL   Hemoglobin 8.8 (L) 12.0 - 15.0 g/dL   HCT 28.9 (L) 36.0 - 46.0 %   MCV 91.2 80.0 - 100.0 fL   MCH 27.8 26.0 - 34.0 pg   MCHC 30.4 30.0 - 36.0 g/dL   RDW 14.8 11.5 - 15.5 %   Platelets 326 150 - 400 K/uL   nRBC 0.0 0.0 - 0.2 %   Neutrophils Relative % 48 %   Neutro Abs 2.7 1.7 - 7.7 K/uL   Lymphocytes Relative 36 %   Lymphs Abs 2.1 0.7 - 4.0  K/uL   Monocytes Relative 7 %   Monocytes Absolute 0.4 0.1 - 1.0 K/uL   Eosinophils Relative 8 %   Eosinophils Absolute 0.5 0.0 - 0.5 K/uL   Basophils Relative 1 %   Basophils Absolute 0.0 0.0 - 0.1 K/uL   Immature Granulocytes 0 %   Abs Immature Granulocytes 0.01 0.00 - 0.07 K/uL    Comment: Performed at [facility], [address] [place], [place] [address]  TSH     Status: Abnormal   Collection Time: 05/24/18  7:28 PM  Result Value Ref Range   TSH 5.567 (H) 0.350 - 4.500 uIU/mL    Comment: Performed by a 3rd Generation assay with a functional sensitivity of <=0.01 uIU/mL. Performed at [facility], [place] [address] [place], [place] [address]   Troponin I - Now Then Q6H     Status: None   Collection Time: 05/24/18  7:28 PM  Result Value Ref Range   Troponin I <0.03 <0.03 ng/mL    Comment: Performed at [facility] [address], [place], [place] [address]  Basic metabolic panel     Status: Abnormal   Collection Time: 05/24/18  7:28 PM  Result Value Ref Range   Sodium 139 135 - 145 mmol/L   Potassium 4.6 3.5 - 5.1 mmol/L   Chloride 112 (H) 98 - 111 mmol/L   CO2 18 (L) 22 - 32 mmol/L   Glucose, Bld 96 70 - 99 mg/dL   BUN 33 (H) 6 - 20 mg/dL   Creatinine, Ser 3.05 (H) 0.44 - 1.00 mg/dL   Calcium 8.0 (L) 8.9 - 10.3 mg/dL   GFR calc non Af Amer 19 (L) >60 mL/min   GFR calc Af Amer 22 (L) >60 mL/min   Anion gap 9 5 - 15    Comment: Performed at [facility] [address], [place], [place] [address]  Glucose, capillary     Status: Abnormal   Collection Time: 05/24/18  9:36 PM  Result Value Ref Range   Glucose-Capillary 124 (H) 70 - 99 mg/dL  Troponin I - Now Then Q6H     Status: None   Collection Time: 05/25/18 12:09 AM  Result Value Ref Range   Troponin I <0.03 <0.03 ng/mL    Comment: Performed at [facility]  [address] [place], [place] [address]  Comprehensive metabolic panel     Status: Abnormal   Collection Time: 05/25/18  6:18  AM  Result Value Ref Range   Sodium 138 135 - 145 mmol/L   Potassium 4.8 3.5 - 5.1 mmol/L   Chloride 110 98 - 111 mmol/L   CO2 21 (L) 22 - 32 mmol/L   Glucose, Bld 85 70 - 99 mg/dL   BUN 34 (H) 6 - 20 mg/dL   Creatinine, Ser 3.18 (H) 0.44 - 1.00 mg/dL   Calcium 8.0 (L) 8.9 - 10.3 mg/dL   Total Protein 6.5 6.5 - 8.1 g/dL   Albumin 2.2 (L) 3.5 - 5.0 g/dL   AST 20 15 - 41 U/L   ALT 18 0 - 44 U/L   Alkaline Phosphatase 136 (H) 38 - 126 U/L   Total Bilirubin 0.5 0.3 - 1.2 mg/dL   GFR calc non Af Amer 18 (L) >60 mL/min   GFR calc Af Amer 21 (L) >60 mL/min   Anion gap 7 5 - 15    Comment: Performed at [facility], [place] [address] [place], [place] [address]  Troponin I - Now Then Q6H     Status: None   Collection Time: 05/25/18  6:18 AM  Result Value Ref Range   Troponin I <0.03 <0.03 ng/mL    Comment: Performed at [facility] [address] [place], [place] [address]  Glucose, capillary     Status: None   Collection Time: 05/25/18  7:40 AM  Result Value Ref Range   Glucose-Capillary 87 70 - 99 mg/dL  Glucose, capillary     Status: Abnormal   Collection Time: 05/25/18 12:47 PM  Result Value Ref Range   Glucose-Capillary 108 (H) 70 - 99 mg/dL  Glucose, capillary     Status: None   Collection Time: 05/25/18  4:37 PM  Result Value Ref Range   Glucose-Capillary 98 70 - 99 mg/dL  Glucose, capillary     Status: Abnormal   Collection Time: 05/25/18  9:44 PM  Result Value Ref Range   Glucose-Capillary 107 (H) 70 - 99 mg/dL   Comment 1 Notify RN    Comment 2 Document in Chart   Basic metabolic panel     Status: Abnormal   Collection Time: 05/26/18  4:57 AM  Result Value Ref Range   Sodium 136 135 - 145 mmol/L   Potassium 4.8 3.5 - 5.1 mmol/L   Chloride 107 98 - 111 mmol/L   CO2 21 (L) 22 - 32 mmol/L   Glucose, Bld 99 70 - 99 mg/dL   BUN 35 (H) 6 - 20 mg/dL   Creatinine, Ser 3.28 (H) 0.44 - 1.00 mg/dL   Calcium 8.0 (L) 8.9 - 10.3 mg/dL   GFR calc non Af Amer 17 (L)  >60 mL/min   GFR calc Af Amer 20 (L) >60 mL/min   Anion gap 8 5 - 15    Comment: Performed at [facility], [place] [address] [place], [place] [address]  T4, free     Status: Abnormal   Collection Time: 05/26/18  4:57 AM  Result Value Ref Range   Free T4 0.71 (L) 0.82 - 1.77 ng/dL    Comment: (NOTE) Biotin ingestion may interfere with free T4 tests. If the results are inconsistent with the TSH level, previous test results, or the clinical presentation, then consider biotin interference. If needed, order repeat testing after stopping biotin. Performed at [facility], [place] [address], [place], [place] [address]   Hemoglobin A1c     Status: Abnormal   Collection Time: 05/26/18  4:57 AM  Result Value Ref Range   Hgb A1c MFr Bld 7.2 (H) 4.8 - 5.6 %    Comment: (NOTE) Pre diabetes:          5.7%-6.4% Diabetes:              >6.4% Glycemic control for   <7.0% adults with diabetes    Mean Plasma Glucose 159.94 mg/dL    Comment: Performed at [facility] [address] [place], [place] [address]  Glucose, capillary     Status: None   Collection Time: 05/26/18  7:33 AM  Result Value Ref Range   Glucose-Capillary 80 70 - 99 mg/dL  Glucose, capillary     Status: Abnormal   Collection Time: 05/26/18 11:37 AM  Result Value Ref Range   Glucose-Capillary 134 (H) 70 - 99 mg/dL    Dg Chest 2 View  Result Date: 05/25/2018 CLINICAL DATA:  Congestive heart failure. EXAM: CHEST - 2 VIEW COMPARISON:  None. FINDINGS: Heart size is normal. There is pulmonary venous hypertension with mild interstitial edema, fluid in the fissures and a small amount of fluid in the posterior costophrenic angles. No consolidation or collapse. No significant bone finding. IMPRESSION: Congestive heart failure pattern with pulmonary venous hypertension, interstitial edema and a small pleural fluid. Electronically Signed   By: Nelson Chimes M.D.   On: 05/25/2018 07:05    Assessment/Plan 1.  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  She has had evidence before of glomerular hyperfiltration (Scr 0.4-0.6 several years ago).  Most recent episode of AKI likely hemodynamically mediated in setting of acute CHF exacerbation.   Albumin is 2.2 and therefore will likely have third spacing.  I agree with diuretics, if desired can increase to furosemide 40 IV BID for a more robust diuresis-- not imperative as pt's weights are decreasing appropriately with current dosing.  Will likely require dialysis in the near future but I do not anticipate during this hospitalization.    2.  Acute on chronic systolic and diastolic CHF exacerbation: mildy elevated trops, not consistent with ACS, cardiology following, TTE with EF 45-50% and G2DD  3.  HTN: would not start ACEi/ARB in setting of AKI on CKD  4.  Dispo: pending improvement in vol status, Cr  Keena Heesch 05/26/2018, 3:06 PM

## 2018-05-26 NOTE — Progress Notes (Signed)
PROGRESS NOTE  Carla Compton  TUU:[id number] DOB: 09-17-1980 DOA: 05/24/2018 PCP: No primary care provider on file.   Brief Narrative: Carla Compton is a 37 y.o. female with a history of IDDM, morbid obesity, HTN, and stage III CKD who presented to [facility] ED with progressive swelling of the body, mostly legs for weeks, and dyspnea worsening over a couple days. Per records, creatinine was above baseline (?2-3) at 3.3, and CXR was consistent with CHF, felt to be a new diagnosis. BNP 3518, troponin 0.29, hgb 8.8. She was transferred to [facility] for cardiology consultation.   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 Note BUN:Cr remains only about 10:1. Note also some element of edema will be due to hypoalbuminuria. 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  - Nephrology consulted, appreciate their recommendations.  - Monitor BMP with diuresis - Avoid nephrotoxins as able. - Needs aggressive control of HTN, DM, obesity - Continue calcitriol  IDDM: Dx in 20's, initially on po meds, now on basal-bolus insulin. Fair control with HbA1c 7.2%. - Continue bolus insulin with sliding scale qAC/HS. At inpatient goal. - Restart home statin  HTN:  - Resumed home hydralazine since BPs up, defer to cardiology for further management. - Added coreg - Lasix as above  Morbid obesity: BMI 50. -  Dietitian consulted, education provided.  Anemia of chronic disease: No active bleeding noted. - Monitor hgb.   Elevated troponin: Reported at [facility]. Cycled and undetectable here with no chest pain. Nonischemic ECG (diffuse low voltage t waves, no STE/D on my personal review).  - Per cardiology.   Elevated TSH: Mild, 5.567 with slightly low T4. - T3 pending - Could consider low dose synthroid, though this could also be euthyroid sick syndrome. With severity of IDDM, there is a higher change of autoimmune thyroid disease, though she is not displaying symptoms attributable solely to hypothyroidism. Will currently opt to recheck TFTs in about 4 weeks to inform further decision making.    Alkaline phosphatase elevation: Modest, suspect FLD with DM, obesity.  - Monitor  DVT prophylaxis: Heparin Code Status: Full Family Communication: Many Aunt's and uncle at bedside, father updated by speakerphone as well. Disposition Plan: Uncertain, home once euvolemic and renal function stable.  Consultants:   Cardiology  Nephrology  Procedures:   Echocardiogram: - Left ventricle: The cavity size was mildly dilated. There was mild concentric hypertrophy. Systolic function was mildly reduced. The estimated ejection fraction was in the range of 45% to 50%. Diffuse hypokinesis. Features are consistent with a pseudonormal left ventricular filling pattern, with concomitant abnormal relaxation and increased filling pressure (grade 2 diastolic dysfunction). - Left atrium: The atrium was mildly dilated.  Antimicrobials:  None   Subjective: Breathing slightly better, has been urinating a lot, swelling in legs down "about 30%." Short of breath with a few steps. At baseline has no dyspnea on exertion.   Objective: Vitals:   05/26/18  0055 05/26/18 0420 05/26/18 0424 05/26/18 0826  BP: (!) 159/88 (!) 151/79  (!) 171/109  Pulse: 95 97  90  Resp: 18 20    Temp: 98.7 F (37.1 C) 98.3 F  (36.8 C)    TempSrc: Oral Oral    SpO2: 96% 98%  99%  Weight:   123.2 kg   Height:        Intake/Output Summary (Last 24 hours) at 05/26/2018 1704 Last data filed at 05/26/2018 1611 Gross per 24 hour  Intake 440 ml  Output 2500 ml  Net -2060 ml   Filed Weights   05/24/18 1812 05/25/18 0100 05/26/18 0424  Weight: 125.1 kg 124.3 kg 123.2 kg   Gen: Pleasant, obese female in no distress Pulm: Nonlabored breathing room air. Clear, diminished. CV: Regular rate and rhythm. No murmur, rub, or gallop. Unable to determine JVD, 3+ LE and 1+ pitting UE edema. GI: Abdomen soft, non-tender, non-distended, with normoactive bowel sounds.  Ext: Warm, no deformities Skin: No rashes, lesions or ulcers on visualized skin. No definite acanthosis.  Neuro: Alert and oriented. No focal neurological deficits. Psych: Judgement and insight appear fair. Mood euthymic & affect congruent. Behavior is appropriate.    CBC: Recent Labs  Lab 05/24/18 1928  WBC 5.7  NEUTROABS 2.7  HGB 8.8*  HCT 28.9*  MCV 91.2  PLT 878   Basic Metabolic Panel: Recent Labs  Lab 05/24/18 1928 05/25/18 0618 05/26/18 0457  NA 139 138 136  K 4.6 4.8 4.8  CL 112* 110 107  CO2 18* 21* 21*  GLUCOSE 96 85 99  BUN 33* 34* 35*  CREATININE 3.05* 3.18* 3.28*  CALCIUM 8.0* 8.0* 8.0*   Liver Function Tests: Recent Labs  Lab 05/25/18 0618  AST 20  ALT 18  ALKPHOS 136*  BILITOT 0.5  PROT 6.5  ALBUMIN 2.2*   Cardiac Enzymes: Recent Labs  Lab 05/24/18 1928 05/25/18 0009 05/25/18 0618  TROPONINI <0.03 <0.03 <0.03   HbA1C: Recent Labs    05/26/18 0457  HGBA1C 7.2*   CBG: Recent Labs  Lab 05/25/18 1247 05/25/18 1637 05/25/18 2144 05/26/18 0733 05/26/18 1137  GLUCAP 108* 98 107* 80 134*   Thyroid Function Tests: Recent Labs    05/24/18 1928 05/26/18 0457  TSH 5.567*  --   FREET4  --  0.71*   Time spent: 25 minutes.  Patrecia Pour, MD [facility] [email or web address] Password [email or web address] 05/26/2018,  5:04 PM

## 2018-05-26 NOTE — Plan of Care (Signed)
Nutrition Education Note  RD consulted for nutrition education regarding CHF and diabetes.  Lab Results  Component Value Date   HGBA1C 7.2 (H) 05/26/2018     RD provided "Heart Healthy, Consistent Carbohydrate Nutrition Therapy" handout from the Academy of Nutrition and Dietetics. Reviewed patient's dietary recall. Provided examples on ways to decrease sodium intake in diet. Discouraged intake of processed foods and use of salt shaker. Encouraged fresh fruits and vegetables as well as whole grain sources of carbohydrates to maximize fiber intake.   RD discussed why it is important for patient to adhere to diet recommendations, and emphasized the role of fluids, foods to avoid, and importance of weighing self daily.   Discussed different food groups and their effects on blood sugar, emphasizing carbohydrate-containing foods. Provided list of carbohydrates and recommended serving sizes of common foods.  Discussed importance of controlled and consistent carbohydrate intake throughout the day. Provided examples of ways to balance meals/snacks and encouraged intake of high-fiber, whole grain complex carbohydrates. Teach back method used.  Expect  fair to good compliance.  Body mass index is 49.69 kg/m. Pt meets criteria for extreme obesity, class III based on current BMI.  Current diet order is Heart Healthy/ carb Modified, patient is consuming approximately 100% of meals at this time. Labs and medications reviewed. No further nutrition interventions warranted at this time. RD contact information provided. If additional nutrition issues arise, please re-consult RD.  Zubayr Bednarczyk A. Jimmye Norman, RD, LDN, CDE Pager: [phone] After hours Pager: [phone]

## 2018-05-26 NOTE — Progress Notes (Signed)
DAILY PROGRESS NOTE   Patient Name: Carla Compton Date of Encounter: 05/26/2018  Chief Complaint   Breathing is better  Patient Profile   Carla Compton is a 37 y.o. female with a hx of DM, HTN, RA,  morbid obesity, GERD, lymphedema, CKD-3 who is being seen today for the evaluation of chf at the request of Dr. Bonner Puna  Subjective   Diuresed over 1L overnight. Echo yesterday shows mild cardiomyopathy with LVEF 45-50%, global hypokinesis and grade 2 DD. Blood pressure remains elevated.  Objective   Vitals:   05/26/18 0055 05/26/18 0420 05/26/18 0424 05/26/18 0826  BP: (!) 159/88 (!) 151/79  (!) 171/109  Pulse: 95 97  90  Resp: 18 20    Temp: 98.7 F (37.1 C) 98.3 F (36.8 C)    TempSrc: Oral Oral    SpO2: 96% 98%  99%  Weight:   123.2 kg   Height:        Intake/Output Summary (Last 24 hours) at 05/26/2018 0932 Last data filed at 05/26/2018 0800 Gross per 24 hour  Intake 710 ml  Output 2000 ml  Net -1290 ml   Filed Weights   05/24/18 1812 05/25/18 0100 05/26/18 0424  Weight: 125.1 kg 124.3 kg 123.2 kg    Physical Exam   General appearance: alert and no distress Lungs: diminished breath sounds bibasilar Heart: regular rate and rhythm Extremities: edema 1+ ede,a Neurologic: Grossly normal  Inpatient Medications    Scheduled Meds: . calcitRIOL  0.25 mcg Oral Once per day on Mon Wed Fri  . carvedilol  3.125 mg Oral BID WC  . dextromethorphan-guaiFENesin  1 tablet Oral BID  . furosemide  20 mg Intravenous BID  . heparin  5,000 Units Subcutaneous Q8H  . hydrALAZINE  25 mg Oral TID  . insulin aspart  0-5 Units Subcutaneous QHS  . insulin aspart  0-9 Units Subcutaneous TID WC  . rosuvastatin  40 mg Oral QHS  . sodium chloride flush  3 mL Intravenous Q12H    Continuous Infusions: . sodium chloride      PRN Meds: sodium chloride, acetaminophen, albuterol, ondansetron (ZOFRAN) IV, sodium chloride flush   Labs   Results for orders placed or  performed during the hospital encounter of 05/24/18 (from the past 48 hour(s))  CBC WITH DIFFERENTIAL     Status: Abnormal   Collection Time: 05/24/18  7:28 PM  Result Value Ref Range   WBC 5.7 4.0 - 10.5 K/uL   RBC 3.17 (L) 3.87 - 5.11 MIL/uL   Hemoglobin 8.8 (L) 12.0 - 15.0 g/dL   HCT 28.9 (L) 36.0 - 46.0 %   MCV 91.2 80.0 - 100.0 fL   MCH 27.8 26.0 - 34.0 pg   MCHC 30.4 30.0 - 36.0 g/dL   RDW 14.8 11.5 - 15.5 %   Platelets 326 150 - 400 K/uL   nRBC 0.0 0.0 - 0.2 %   Neutrophils Relative % 48 %   Neutro Abs 2.7 1.7 - 7.7 K/uL   Lymphocytes Relative 36 %   Lymphs Abs 2.1 0.7 - 4.0 K/uL   Monocytes Relative 7 %   Monocytes Absolute 0.4 0.1 - 1.0 K/uL   Eosinophils Relative 8 %   Eosinophils Absolute 0.5 0.0 - 0.5 K/uL   Basophils Relative 1 %   Basophils Absolute 0.0 0.0 - 0.1 K/uL   Immature Granulocytes 0 %   Abs Immature Granulocytes 0.01 0.00 - 0.07 K/uL    Comment: Performed at [facility],  [address], [place], [place] [address]  TSH     Status: Abnormal   Collection Time: 05/24/18  7:28 PM  Result Value Ref Range   TSH 5.567 (H) 0.350 - 4.500 uIU/mL    Comment: Performed by a 3rd Generation assay with a functional sensitivity of <=0.01 uIU/mL. Performed at [facility], [place] [address], [place], [place] [address]   Troponin I - Now Then Q6H     Status: None   Collection Time: 05/24/18  7:28 PM  Result Value Ref Range   Troponin I <0.03 <0.03 ng/mL    Comment: Performed at [facility] [address] [place], [place] [address]  Basic metabolic panel     Status: Abnormal   Collection Time: 05/24/18  7:28 PM  Result Value Ref Range   Sodium 139 135 - 145 mmol/L   Potassium 4.6 3.5 - 5.1 mmol/L   Chloride 112 (H) 98 - 111 mmol/L   CO2 18 (L) 22 - 32 mmol/L   Glucose, Bld 96 70 - 99 mg/dL   BUN 33 (H) 6 - 20 mg/dL   Creatinine, Ser 3.05 (H) 0.44 - 1.00 mg/dL   Calcium 8.0 (L) 8.9 - 10.3 mg/dL   GFR calc non Af Amer 19 (L) >60 mL/min   GFR  calc Af Amer 22 (L) >60 mL/min   Anion gap 9 5 - 15    Comment: Performed at [facility] [address] [place], [place] [address]  Glucose, capillary     Status: Abnormal   Collection Time: 05/24/18  9:36 PM  Result Value Ref Range   Glucose-Capillary 124 (H) 70 - 99 mg/dL  Troponin I - Now Then Q6H     Status: None   Collection Time: 05/25/18 12:09 AM  Result Value Ref Range   Troponin I <0.03 <0.03 ng/mL    Comment: Performed at [facility] [address] [place], [place] [address]  Comprehensive metabolic panel     Status: Abnormal   Collection Time: 05/25/18  6:18 AM  Result Value Ref Range   Sodium 138 135 - 145 mmol/L   Potassium 4.8 3.5 - 5.1 mmol/L   Chloride 110 98 - 111 mmol/L   CO2 21 (L) 22 - 32 mmol/L   Glucose, Bld 85 70 - 99 mg/dL   BUN 34 (H) 6 - 20 mg/dL   Creatinine, Ser 3.18 (H) 0.44 - 1.00 mg/dL   Calcium 8.0 (L) 8.9 - 10.3 mg/dL   Total Protein 6.5 6.5 - 8.1 g/dL   Albumin 2.2 (L) 3.5 - 5.0 g/dL   AST 20 15 - 41 U/L   ALT 18 0 - 44 U/L   Alkaline Phosphatase 136 (H) 38 - 126 U/L   Total Bilirubin 0.5 0.3 - 1.2 mg/dL   GFR calc non Af Amer 18 (L) >60 mL/min   GFR calc Af Amer 21 (L) >60 mL/min   Anion gap 7 5 - 15    Comment: Performed at [facility], [place] [address], [place],  [address]  Troponin I - Now Then Q6H     Status: None   Collection Time: 05/25/18  6:18 AM  Result Value Ref Range   Troponin I <0.03 <0.03 ng/mL    Comment: Performed at [facility] [address], [place],  [address]  Glucose, capillary     Status: None   Collection Time: 05/25/18  7:40 AM  Result  Value Ref Range   Glucose-Capillary 87 70 - 99 mg/dL  Glucose, capillary     Status: Abnormal   Collection Time: 05/25/18 12:47 PM  Result Value Ref Range   Glucose-Capillary 108 (H) 70 - 99 mg/dL  Glucose, capillary     Status: None   Collection Time: 05/25/18  4:37 PM  Result Value Ref Range   Glucose-Capillary 98 70 - 99 mg/dL    Glucose, capillary     Status: Abnormal   Collection Time: 05/25/18  9:44 PM  Result Value Ref Range   Glucose-Capillary 107 (H) 70 - 99 mg/dL   Comment 1 Notify RN    Comment 2 Document in Chart   Basic metabolic panel     Status: Abnormal   Collection Time: 05/26/18  4:57 AM  Result Value Ref Range   Sodium 136 135 - 145 mmol/L   Potassium 4.8 3.5 - 5.1 mmol/L   Chloride 107 98 - 111 mmol/L   CO2 21 (L) 22 - 32 mmol/L   Glucose, Bld 99 70 - 99 mg/dL   BUN 35 (H) 6 - 20 mg/dL   Creatinine, Ser 3.28 (H) 0.44 - 1.00 mg/dL   Calcium 8.0 (L) 8.9 - 10.3 mg/dL   GFR calc non Af Amer 17 (L) >60 mL/min   GFR calc Af Amer 20 (L) >60 mL/min   Anion gap 8 5 - 15    Comment: Performed at [facility], [address], [place], [place] [address]  T4, free     Status: Abnormal   Collection Time: 05/26/18  4:57 AM  Result Value Ref Range   Free T4 0.71 (L) 0.82 - 1.77 ng/dL    Comment: (NOTE) Biotin ingestion may interfere with free T4 tests. If the results are inconsistent with the TSH level, previous test results, or the clinical presentation, then consider biotin interference. If needed, order repeat testing after stopping biotin. Performed at [facility], [place] [address] [place], [place] [address]   Hemoglobin A1c     Status: Abnormal   Collection Time: 05/26/18  4:57 AM  Result Value Ref Range   Hgb A1c MFr Bld 7.2 (H) 4.8 - 5.6 %    Comment: (NOTE) Pre diabetes:          5.7%-6.4% Diabetes:              >6.4% Glycemic control for   <7.0% adults with diabetes    Mean Plasma Glucose 159.94 mg/dL    Comment: Performed at [facility] [address], [place], [place] [address]  Glucose, capillary     Status: None   Collection Time: 05/26/18  7:33 AM  Result Value Ref Range   Glucose-Capillary 80 70 - 99 mg/dL    ECG   N/A  Telemetry   (Telemetry system is being serviced)  Radiology    Dg Chest 2 View  Result Date: 05/25/2018 CLINICAL DATA:   Congestive heart failure. EXAM: CHEST - 2 VIEW COMPARISON:  None. FINDINGS: Heart size is normal. There is pulmonary venous hypertension with mild interstitial edema, fluid in the fissures and a small amount of fluid in the posterior costophrenic angles. No consolidation or collapse. No significant bone finding. IMPRESSION: Congestive heart failure pattern with pulmonary venous hypertension, interstitial edema and a small pleural fluid. Electronically Signed   By: Nelson Chimes M.D.   On: 05/25/2018 07:05    Cardiac Studies   LV EF: 45% -   50%  -------------------------------------------------------------------  Indications:      CHF - 428.0.  ------------------------------------------------------------------- History:   PMH:  ARF (acute renal failure) Acquired from the patient and from the patient&'s chart.  PMH:  Morbid Obesity.  Risk factors:  Hypertension. Diabetes mellitus.  ------------------------------------------------------------------- Study Conclusions  - Left ventricle: The cavity size was mildly dilated. There was   mild concentric hypertrophy. Systolic function was mildly   reduced. The estimated ejection fraction was in the range of 45%   to 50%. Diffuse hypokinesis. Features are consistent with a   pseudonormal left ventricular filling pattern, with concomitant   abnormal relaxation and increased filling pressure (grade 2   diastolic dysfunction). - Left atrium: The atrium was mildly dilated.  Assessment   1. Active Problems: 2.   CHF (congestive heart failure) ([place]) 3.   Diabetes mellitus without complication ([place]) 4.   Benign essential HTN 5.   ARF (acute renal failure) ([place]) 6.   Normocytic anemia 7.   Acute CHF (congestive heart failure) ([place]) 8.   Plan   1. Good diuresis overnight - GFR stable. May benefit from renal evaluation - she sees a nephrologist in [place] - no fistula yet, but told she would likely go on dialysis at some point. LVEF  45-50%, global hypokinesis, suspect hypertensive cardiomyopathy. BP not well-controlled- will increase coreg additionally today. Check FLP in am tomorrow - on Crestor, goal LDL <70.  Time Spent Directly with Patient:  I have spent a total of 25 minutes with the patient reviewing hospital notes, telemetry, EKGs, labs and examining the patient as well as establishing an assessment and plan that was discussed personally with the patient.  > 50% of time was spent in direct patient care.  Length of Stay:  LOS: 2 days   Pixie Casino, MD, [facility], [place] Director of the Advanced Lipid Disorders &  Cardiovascular Risk Reduction [facility] Diplomate of the American Board of Clinical Lipidology Attending Cardiologist  Direct Dial: [phone]  Fax: [phone]  Website:  [email or web address] 05/26/2018, 9:32 AM

## 2018-05-27 ENCOUNTER — Inpatient Hospital Stay (HOSPITAL_COMMUNITY): Payer: BLUE CROSS/BLUE SHIELD

## 2018-05-27 LAB — T3, FREE: T3 FREE: 1.5 pg/mL — AB (ref 2.0–4.4)

## 2018-05-27 LAB — BASIC METABOLIC PANEL
Anion gap: 11 (ref 5–15)
BUN: 36 mg/dL — ABNORMAL HIGH (ref 6–20)
CO2: 20 mmol/L — ABNORMAL LOW (ref 22–32)
Calcium: 8.1 mg/dL — ABNORMAL LOW (ref 8.9–10.3)
Chloride: 107 mmol/L (ref 98–111)
Creatinine, Ser: 3.29 mg/dL — ABNORMAL HIGH (ref 0.44–1.00)
GFR calc Af Amer: 20 mL/min — ABNORMAL LOW (ref >60)
GFR calc non Af Amer: 17 mL/min — ABNORMAL LOW (ref >60)
Glucose, Bld: 136 mg/dL — ABNORMAL HIGH (ref 70–99)
Potassium: 4.6 mmol/L (ref 3.5–5.1)
Sodium: 138 mmol/L (ref 135–145)

## 2018-05-27 LAB — GLUCOSE, CAPILLARY
Glucose-Capillary: 85 mg/dL (ref 70–99)
Glucose-Capillary: 124 mg/dL — ABNORMAL HIGH (ref 70–99)
Glucose-Capillary: 119 mg/dL — ABNORMAL HIGH (ref 70–99)
Glucose-Capillary: 192 mg/dL — ABNORMAL HIGH (ref 70–99)

## 2018-05-27 LAB — LIPID PANEL
Cholesterol: 194 mg/dL (ref 0–200)
HDL: 40 mg/dL — ABNORMAL LOW (ref >40)
LDL Cholesterol: 116 mg/dL — ABNORMAL HIGH (ref 0–99)
Total CHOL/HDL Ratio: 4.9 RATIO
Triglycerides: 190 mg/dL — ABNORMAL HIGH (ref <150)
VLDL: 38 mg/dL (ref 0–40)

## 2018-05-27 MED ORDER — FUROSEMIDE 10 MG/ML IJ SOLN
40.0000 mg | Freq: Two times a day (BID) | INTRAMUSCULAR | Status: DC
  Administered 2018-05-27 – 2018-05-28 (×2): 40 mg via INTRAVENOUS
  Filled 2018-05-27 (×2): qty 4

## 2018-05-27 MED ORDER — FUROSEMIDE 10 MG/ML IJ SOLN
20.0000 mg | Freq: Once | INTRAMUSCULAR | Status: AC
  Administered 2018-05-27: 20 mg via INTRAVENOUS
  Filled 2018-05-27: qty 2

## 2018-05-27 MED ORDER — MUPIROCIN CALCIUM 2 % EX CREA
TOPICAL_CREAM | Freq: Every day | CUTANEOUS | Status: DC
  Administered 2018-05-27 – 2018-05-30 (×4): via TOPICAL
  Filled 2018-05-27: qty 15

## 2018-05-27 MED ORDER — MUPIROCIN 2 % EX OINT
TOPICAL_OINTMENT | CUTANEOUS | Status: AC
  Administered 2018-05-27: 1
  Filled 2018-05-27: qty 22

## 2018-05-27 NOTE — Progress Notes (Signed)
PROGRESS NOTE    Carla Compton  SFK:[id number] DOB: 1980/08/01 DOA: 05/24/2018 PCP: No primary care provider on file.    Brief Narrative:  37 y.o. female with a history of IDDM, morbid obesity, HTN, and stage III CKD who presented to [facility] ED with progressive swelling of the body, mostly legs for weeks, and dyspnea worsening over a couple days. Per records, creatinine was above baseline (?2-3) at 3.3, and CXR was consistent with CHF, felt to be a new diagnosis. BNP 3518, troponin 0.29, hgb 8.8. She was transferred to [facility] for cardiology consultation  Assessment & Plan:   Principal Problem:   Acute systolic (congestive) heart failure (HCC) Active Problems:   Uncontrolled secondary diabetes mellitus with stage 4 CKD (GFR 15-29) (HCC)   Secondary DM with CKD stage 4 and hypertension (HCC)   ARF (acute renal failure) (HCC)   Normocytic anemia   CKD (chronic kidney disease) stage 4, GFR 15-29 ml/min (HCC)  Acute HFpEF:  - Cardiology following - Continue lasix as ordered, weights down, having good diuresis with modest bump in creatinine. Note BUN:Cr remains only about 10:1. Note also some element of edema will be due to hypoalbuminuria. 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  - Nephrology consulted, appreciate their recommendations.  - Monitor BMP with diuresis - Avoid nephrotoxins as able. - Needs aggressive control of HTN, DM, obesity - Continue calcitriol -Repeat bmet in AM. Cr remains stable at this time  IDDM: Dx in 20's, initially on po meds, now on basal-bolus insulin. Fair control with HbA1c 7.2%. - Continue bolus insulin with sliding scale qAC/HS. At inpatient goal. - Continue on home statin  HTN:  - Resumed home hydralazine since BPs up,  defer to cardiology for further management. - Continue coreg - Continue with Lasix as above  Morbid obesity: BMI 50. - Dietitian consulted, education provided.  Anemia of chronic disease: No active bleeding noted. - Monitor hgb.   Elevated troponin: Reported at [facility]. Cycled and undetectable here with no chest pain. Nonischemic ECG (diffuse low voltage t waves, no STE/D on my personal review).  - Cardiology following   Elevated TSH: Mild, 5.567 with slightly low T4. - T3 pending - Could consider low dose synthroid, though this could also be euthyroid sick syndrome. -Recommend repeat TSH in 4 weeks from last check. If still c/w hypothyroid, would begin tx at that time  Alkaline phosphatase elevation: Modest, suspect FLD with DM, obesity.  - seems stable at this time  L 5th digit ulcer -Xray ordered and reviewed, no osseous findings -Consulted WOC, input appreciated    DVT prophylaxis: Heparin subQ Code Status: Full Family Communication: Pt in room, family not at bedside Disposition Plan: D/c when fully diuresed  Consultants:   Nephrology  Cardiology  Procedures:     Antimicrobials: Anti-infectives (From admission, onward)   None       Subjective: Feels better, however still with LE swelling  Objective: Vitals:   05/26/18 2035 05/27/18 0340 05/27/18 0830 05/27/18 1208  BP: (!) 147/88 137/86 139/90 126/80  Pulse: 88 84 88 85  Resp: 20 20 18 20   Temp: 97.8 F (36.6 C) 97.8 F (36.6 C)  98.1 F (36.7 C)  TempSrc: Oral Oral  Oral  SpO2: 97% 97% 100% 98%  Weight:  122.5 kg  Height:        Intake/Output Summary (Last 24 hours) at 05/27/2018 1544 Last data filed at 05/27/2018 1500 Gross per 24 hour  Intake 1160 ml  Output 1950 ml  Net -790 ml   Filed Weights   05/25/18 0100 05/26/18 0424 05/27/18 0340  Weight: 124.3 kg 123.2 kg 122.5 kg    Examination:  General exam: Appears calm and comfortable  Respiratory system: Clear to auscultation.  Respiratory effort normal. Cardiovascular system: S1 & S2 heard, RRR Gastrointestinal system: Abdomen is nondistended, soft and nontender. No organomegaly or masses felt. Normal bowel sounds heard. obese Central nervous system: Alert and oriented. No focal neurological deficits. Extremities: Symmetric 5 x 5 power, 1-2+ BLE edema Skin: Perfused, no rash Psychiatry: Judgement and insight appear normal. Mood & affect appropriate.   Data Reviewed: I have personally reviewed following labs and imaging studies  CBC: Recent Labs  Lab 05/24/18 1928  WBC 5.7  NEUTROABS 2.7  HGB 8.8*  HCT 28.9*  MCV 91.2  PLT 295   Basic Metabolic Panel: Recent Labs  Lab 05/24/18 1928 05/25/18 0618 05/26/18 0457 05/27/18 0349  NA 139 138 136 138  K 4.6 4.8 4.8 4.6  CL 112* 110 107 107  CO2 18* 21* 21* 20*  GLUCOSE 96 85 99 136*  BUN 33* 34* 35* 36*  CREATININE 3.05* 3.18* 3.28* 3.29*  CALCIUM 8.0* 8.0* 8.0* 8.1*   GFR: Estimated Creatinine Clearance: 29.8 mL/min (A) (by C-G formula based on SCr of 3.29 mg/dL (H)). Liver Function Tests: Recent Labs  Lab 05/25/18 0618  AST 20  ALT 18  ALKPHOS 136*  BILITOT 0.5  PROT 6.5  ALBUMIN 2.2*   No results for input(s): LIPASE, AMYLASE in the last 168 hours. No results for input(s): AMMONIA in the last 168 hours. Coagulation Profile: No results for input(s): INR, PROTIME in the last 168 hours. Cardiac Enzymes: Recent Labs  Lab 05/24/18 1928 05/25/18 0009 05/25/18 0618  TROPONINI <0.03 <0.03 <0.03   BNP (last 3 results) No results for input(s): PROBNP in the last 8760 hours. HbA1C: Recent Labs    05/26/18 0457  HGBA1C 7.2*   CBG: Recent Labs  Lab 05/26/18 1137 05/26/18 1748 05/26/18 2057 05/27/18 0727 05/27/18 1204  GLUCAP 134* 96 136* 85 124*   Lipid Profile: Recent Labs    05/27/18 0349  CHOL 194  HDL 40*  LDLCALC 116*  TRIG 190*  CHOLHDL 4.9   Thyroid Function Tests: Recent Labs    05/24/18 1928 05/26/18 0457    TSH 5.567*  --   FREET4  --  0.71*  T3FREE  --  1.5*   Anemia Panel: No results for input(s): VITAMINB12, FOLATE, FERRITIN, TIBC, IRON, RETICCTPCT in the last 72 hours. Sepsis Labs: No results for input(s): PROCALCITON, LATICACIDVEN in the last 168 hours.  No results found for this or any previous visit (from the past 240 hour(s)).   Radiology Studies: Dg Foot Complete Left  Result Date: 05/27/2018 CLINICAL DATA:  Possible fifth digit fracture with pain. EXAM: LEFT FOOT - COMPLETE 3+ VIEW COMPARISON:  None. FINDINGS: Irregularity of the fifth digit skin which may be related to history of bandage. There is no soft tissue emphysema, opaque foreign body, erosion, or fracture. Dorsal soft tissue swelling of the forefoot. Osteopenia IMPRESSION: No acute osseous finding. Electronically Signed   By: Monte Fantasia M.D.   On: 05/27/2018 14:39    Scheduled Meds: . calcitRIOL  0.25 mcg Oral Once per day on Mon Wed  Fri  . carvedilol  12.5 mg Oral BID WC  . dextromethorphan-guaiFENesin  1 tablet Oral BID  . furosemide  40 mg Intravenous BID  . heparin  5,000 Units Subcutaneous Q8H  . hydrALAZINE  25 mg Oral TID  . insulin aspart  0-5 Units Subcutaneous QHS  . insulin aspart  0-9 Units Subcutaneous TID WC  . mupirocin cream   Topical Daily  . rosuvastatin  40 mg Oral QHS  . sodium chloride flush  3 mL Intravenous Q12H   Continuous Infusions: . sodium chloride       LOS: 3 days   Marylu Lund, MD [facility] Pager On Amion  If 7PM-7AM, please contact night-coverage 05/27/2018, 3:44 PM

## 2018-05-27 NOTE — Progress Notes (Signed)
DAILY PROGRESS NOTE   Patient Name: Carla Compton Date of Encounter: 05/27/2018  Chief Complaint   Feels better  Patient Profile   Carla Compton is a 37 y.o. female with a hx of DM, HTN, RA,  morbid obesity, GERD, lymphedema, CKD-3 who is being seen today for the evaluation of chf at the request of Dr. Bonner Puna  Subjective   Diuresed another 600 ml overnight - creatinine stable. Lipids are poorly controlled- LDL 116, trigs 190 fasting. A1C 7.2.  Objective   Vitals:   05/26/18 1736 05/26/18 2035 05/27/18 0340 05/27/18 0830  BP: (!) 149/87 (!) 147/88 137/86 139/90  Pulse: 88 88 84 88  Resp: 18 20 20 18   Temp: 98.4 F (36.9 C) 97.8 F (36.6 C) 97.8 F (36.6 C)   TempSrc: Oral Oral Oral   SpO2: 98% 97% 97% 100%  Weight:   122.5 kg   Height:        Intake/Output Summary (Last 24 hours) at 05/27/2018 0850 Last data filed at 05/27/2018 0800 Gross per 24 hour  Intake 680 ml  Output 950 ml  Net -270 ml   Filed Weights   05/25/18 0100 05/26/18 0424 05/27/18 0340  Weight: 124.3 kg 123.2 kg 122.5 kg    Physical Exam   General appearance: alert and no distress Lungs: diminished breath sounds bibasilar Heart: regular rate and rhythm Extremities: edema 1+ edema pitting bilaterally Neurologic: Grossly normal  Inpatient Medications    Scheduled Meds: . calcitRIOL  0.25 mcg Oral Once per day on Mon Wed Fri  . carvedilol  12.5 mg Oral BID WC  . dextromethorphan-guaiFENesin  1 tablet Oral BID  . furosemide  20 mg Intravenous BID  . heparin  5,000 Units Subcutaneous Q8H  . hydrALAZINE  25 mg Oral TID  . insulin aspart  0-5 Units Subcutaneous QHS  . insulin aspart  0-9 Units Subcutaneous TID WC  . rosuvastatin  40 mg Oral QHS  . sodium chloride flush  3 mL Intravenous Q12H    Continuous Infusions: . sodium chloride      PRN Meds: sodium chloride, acetaminophen, albuterol, ondansetron (ZOFRAN) IV, sodium chloride flush   Labs   Results for orders placed or  performed during the hospital encounter of 05/24/18 (from the past 48 hour(s))  Glucose, capillary     Status: Abnormal   Collection Time: 05/25/18 12:47 PM  Result Value Ref Range   Glucose-Capillary 108 (H) 70 - 99 mg/dL  Glucose, capillary     Status: None   Collection Time: 05/25/18  4:37 PM  Result Value Ref Range   Glucose-Capillary 98 70 - 99 mg/dL  Glucose, capillary     Status: Abnormal   Collection Time: 05/25/18  9:44 PM  Result Value Ref Range   Glucose-Capillary 107 (H) 70 - 99 mg/dL   Comment 1 Notify RN    Comment 2 Document in Chart   Basic metabolic panel     Status: Abnormal   Collection Time: 05/26/18  4:57 AM  Result Value Ref Range   Sodium 136 135 - 145 mmol/L   Potassium 4.8 3.5 - 5.1 mmol/L   Chloride 107 98 - 111 mmol/L   CO2 21 (L) 22 - 32 mmol/L   Glucose, Bld 99 70 - 99 mg/dL   BUN 35 (H) 6 - 20 mg/dL   Creatinine, Ser 3.28 (H) 0.44 - 1.00 mg/dL   Calcium 8.0 (L) 8.9 - 10.3 mg/dL   GFR calc non Af Amer 17 (L) >  60 mL/min   GFR calc Af Amer 20 (L) >60 mL/min   Anion gap 8 5 - 15    Comment: Performed at [facility] [address], [place], [place] [address]  T4, free     Status: Abnormal   Collection Time: 05/26/18  4:57 AM  Result Value Ref Range   Free T4 0.71 (L) 0.82 - 1.77 ng/dL    Comment: (NOTE) Biotin ingestion may interfere with free T4 tests. If the results are inconsistent with the TSH level, previous test results, or the clinical presentation, then consider biotin interference. If needed, order repeat testing after stopping biotin. Performed at [facility], [place] [address] [place], [place] [address]   T3, free     Status: Abnormal   Collection Time: 05/26/18  4:57 AM  Result Value Ref Range   T3, Free 1.5 (L) 2.0 - 4.4 pg/mL    Comment: (NOTE) Performed At: [facility] [place], [place] [id number] Rush Farmer MD QQ:[id number]   Hemoglobin A1c     Status: Abnormal   Collection Time:  05/26/18  4:57 AM  Result Value Ref Range   Hgb A1c MFr Bld 7.2 (H) 4.8 - 5.6 %    Comment: (NOTE) Pre diabetes:          5.7%-6.4% Diabetes:              >6.4% Glycemic control for   <7.0% adults with diabetes    Mean Plasma Glucose 159.94 mg/dL    Comment: Performed at [facility] [address], [place], [place] [address]  Glucose, capillary     Status: None   Collection Time: 05/26/18  7:33 AM  Result Value Ref Range   Glucose-Capillary 80 70 - 99 mg/dL  Glucose, capillary     Status: Abnormal   Collection Time: 05/26/18 11:37 AM  Result Value Ref Range   Glucose-Capillary 134 (H) 70 - 99 mg/dL  Protein / creatinine ratio, urine     Status: Abnormal   Collection Time: 05/26/18  3:25 PM  Result Value Ref Range   Creatinine, Urine 61.55 mg/dL   Total Protein, Urine 640 mg/dL    Comment: RESULTS CONFIRMED BY MANUAL DILUTION NO NORMAL RANGE ESTABLISHED FOR THIS TEST    Protein Creatinine Ratio 10.40 (H) 0.00 - 0.15 mg/mg[Cre]    Comment: Performed at [facility], [place] [address] [place], [place] [address]  Glucose, capillary     Status: None   Collection Time: 05/26/18  5:48 PM  Result Value Ref Range   Glucose-Capillary 96 70 - 99 mg/dL  Glucose, capillary     Status: Abnormal   Collection Time: 05/26/18  8:57 PM  Result Value Ref Range   Glucose-Capillary 136 (H) 70 - 99 mg/dL  Basic metabolic panel     Status: Abnormal   Collection Time: 05/27/18  3:49 AM  Result Value Ref Range   Sodium 138 135 - 145 mmol/L   Potassium 4.6 3.5 - 5.1 mmol/L   Chloride 107 98 - 111 mmol/L   CO2 20 (L) 22 - 32 mmol/L   Glucose, Bld 136 (H) 70 - 99 mg/dL   BUN 36 (H) 6 - 20 mg/dL   Creatinine, Ser 3.29 (H) 0.44 - 1.00 mg/dL   Calcium 8.1 (L) 8.9 - 10.3 mg/dL   GFR calc non Af Amer 17 (L) >60 mL/min   GFR calc Af Amer 20 (L) >60 mL/min   Anion  gap 11 5 - 15    Comment: Performed at [facility], [place] [address] [place], [place] [address]  Lipid panel     Status:  Abnormal   Collection Time: 05/27/18  3:49 AM  Result Value Ref Range   Cholesterol 194 0 - 200 mg/dL   Triglycerides 190 (H) <150 mg/dL   HDL 40 (L) >40 mg/dL   Total CHOL/HDL Ratio 4.9 RATIO   VLDL 38 0 - 40 mg/dL   LDL Cholesterol 116 (H) 0 - 99 mg/dL    Comment:        Total Cholesterol/HDL:CHD Risk Coronary Heart Disease Risk Table                     Men   Women  1/2 Average Risk   3.4   3.3  Average Risk       5.0   4.4  2 X Average Risk   9.6   7.1  3 X Average Risk  23.4   11.0        Use the calculated Patient Ratio above and the CHD Risk Table to determine the patient's CHD Risk.        ATP III CLASSIFICATION (LDL):  <100     mg/dL   Optimal  100-129  mg/dL   Near or Above                    Optimal  130-159  mg/dL   Borderline  160-189  mg/dL   High  >190     mg/dL   Very High Performed at [facility] [address] [place],  [address]   Glucose, capillary     Status: None   Collection Time: 05/27/18  7:27 AM  Result Value Ref Range   Glucose-Capillary 85 70 - 99 mg/dL   Comment 1 Notify RN    Comment 2 Document in Chart     ECG   N/A  Telemetry   Sinus rhythm - personally reviewed  Radiology    No results found.  Cardiac Studies   LV EF: 45% -   50%  ------------------------------------------------------------------- Indications:      CHF - 428.0.  ------------------------------------------------------------------- History:   PMH:  ARF (acute renal failure) Acquired from the patient and from the patient&'s chart.  PMH:  Morbid Obesity.  Risk factors:  Hypertension. Diabetes mellitus.  ------------------------------------------------------------------- Study Conclusions  - Left ventricle: The cavity size was mildly dilated. There was   mild concentric hypertrophy. Systolic function was mildly   reduced. The estimated ejection fraction was in the range of 45%   to 50%. Diffuse hypokinesis. Features are consistent with  a   pseudonormal left ventricular filling pattern, with concomitant   abnormal relaxation and increased filling pressure (grade 2   diastolic dysfunction). - Left atrium: The atrium was mildly dilated.  Assessment   Principal Problem:   Acute systolic (congestive) heart failure (HCC) Active Problems:   Uncontrolled secondary diabetes mellitus with stage 4 CKD (GFR 15-29) (HCC)   Secondary DM with CKD stage 4 and hypertension (HCC)   ARF (acute renal failure) (HCC)   Normocytic anemia   CKD (chronic kidney disease) stage 4, GFR 15-29 ml/min ([place])   Plan   1. Diuresis is slowing, but remains volume overloaded. Appreciate nephrology input - will increase lasix to 40 mg IV BID. Reports wound on the left 5th toe - will order WOC evaluation, especially since she  is diabetic.  Time Spent Directly with Patient:  I have spent a total of 25 minutes with the patient reviewing hospital notes, telemetry, EKGs, labs and examining the patient as well as establishing an assessment and plan that was discussed personally with the patient.  > 50% of time was spent in direct patient care.  Length of Stay:  LOS: 3 days   Pixie Casino, MD, [facility], [place] Director of the [facility] Lipid Disorders &  Cardiovascular Risk Reduction Clinic Diplomate of the American Board of Clinical Lipidology Attending Cardiologist  Direct Dial: [phone]  Fax: [phone]  Website:  [email or web address] Osgood Saysha Menta 05/27/2018, 8:50 AM

## 2018-05-27 NOTE — Consult Note (Addendum)
[facility] Nurse wound consult note Reason for Consult: Consult requested for left 5th toe.  Pt states she previously had a callous which developed blistering, then ruptured and drained mod amt bloody liquid. Wound type: wound to left 5th toe, trimmed outer loose peeling skin with scissors, revealing .3X.3X.2cm full thickness wound. Wound bed: red and dry, surrounded by dry peeling calloused skin Drainage (amount, consistency, odor) scant amt yellow drainage, no odor Periwound: Generalized edema and dark purple skin to entire toe Dressing procedure/placement/frequency: Bactroban to promote moist healing and provide antimicrobial benefits, foam dressing to protect from further injury.  Recommend x-ray to r/o fracture since toe is swollen and painful to touch.  Paged primary team to discuss plan of care. Please re-consult if further assistance is needed.  Thank-you,  Julien Girt MSN, [place], [place], [place], [place]

## 2018-05-27 NOTE — Progress Notes (Signed)
  [facility] Progress Note    Assessment/ Plan:   1. 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  Baseline creatinine 2.2 as of 01/2018.  She has had evidence before of glomerular hyperfiltration (Scr 0.4-0.6 several years ago).  Most recent episode of AKI likely hemodynamically mediated in setting of acute CHF exacerbation.   Albumin is 2.2 and therefore will likely have third spacing, and she has nephrotic range proteinruria of 10g on UP/C.  Lasix increased to 40 IV BID.  Will likely require dialysis in the near future but I do not anticipate during this hospitalization.  Of note, gliflozins (SGLT2i) are gaining popularity as agents to slow the progression of diabetic glomerular sclerosis--> however I do not recommend this now as her eGFR is too low and her diuresis is in flux.  2.  Acute on chronic systolic and diastolic CHF exacerbation: mildy elevated trops, not consistent with ACS, cardiology following, TTE with EF 45-50% and G2DD  3.  HTN: would not start ACEi/ARB in setting of AKI on CKD  4.  Dispo: pending improvement in vol status, Cr  Subjective:    Diuresis is slow but progressing, Lasix increased today.  Pt reports EDW is around 255 lbs.   Objective:   BP 139/90   Pulse 88   Temp 97.8 F (36.6 C) (Oral)   Resp 18   Ht '5\' 2"'$  (1.575 m)   Wt 122.5 kg   LMP 04/29/2018   SpO2 100%   BMI 49.40 kg/m   Intake/Output Summary (Last 24 hours) at 05/27/2018 1154 Last data filed at 05/27/2018 1012 Gross per 24 hour  Intake 680 ml  Output 1950 ml  Net -1270 ml   Weight change: -0.726 kg  Physical Exam: GEN morbidly obese, NAD HEENT EOMI PERRL NECK thick neck, unable to appreciate JVD PULM normal WOB, muffled at bases CV RRR no m/r/g ABD obese EXT 3+ LE edema NEURO AAO x 3 nonfocal  Imaging: No results found.  Labs: BMET Recent Labs  Lab 05/24/18 1928 05/25/18 0618 05/26/18 0457  05/27/18 0349  NA 139 138 136 138  K 4.6 4.8 4.8 4.6  CL 112* 110 107 107  CO2 18* 21* 21* 20*  GLUCOSE 96 85 99 136*  BUN 33* 34* 35* 36*  CREATININE 3.05* 3.18* 3.28* 3.29*  CALCIUM 8.0* 8.0* 8.0* 8.1*   CBC Recent Labs  Lab 05/24/18 1928  WBC 5.7  NEUTROABS 2.7  HGB 8.8*  HCT 28.9*  MCV 91.2  PLT 326    Medications:    . calcitRIOL  0.25 mcg Oral Once per day on Mon Wed Fri  . carvedilol  12.5 mg Oral BID WC  . dextromethorphan-guaiFENesin  1 tablet Oral BID  . furosemide  40 mg Intravenous BID  . heparin  5,000 Units Subcutaneous Q8H  . hydrALAZINE  25 mg Oral TID  . insulin aspart  0-5 Units Subcutaneous QHS  . insulin aspart  0-9 Units Subcutaneous TID WC  . mupirocin cream   Topical Daily  . rosuvastatin  40 mg Oral QHS  . sodium chloride flush  3 mL Intravenous Q12H      Madelon Lips, MD 05/27/2018, 11:54 AM

## 2018-05-28 LAB — BASIC METABOLIC PANEL
Anion gap: 11 (ref 5–15)
BUN: 38 mg/dL — ABNORMAL HIGH (ref 6–20)
CALCIUM: 7.8 mg/dL — AB (ref 8.9–10.3)
CO2: 20 mmol/L — AB (ref 22–32)
Chloride: 105 mmol/L (ref 98–111)
Creatinine, Ser: 3.27 mg/dL — ABNORMAL HIGH (ref 0.44–1.00)
GFR calc Af Amer: 20 mL/min — ABNORMAL LOW (ref >60)
GFR calc non Af Amer: 17 mL/min — ABNORMAL LOW (ref >60)
Glucose, Bld: 163 mg/dL — ABNORMAL HIGH (ref 70–99)
Potassium: 4.6 mmol/L (ref 3.5–5.1)
Sodium: 136 mmol/L (ref 135–145)

## 2018-05-28 LAB — GLUCOSE, CAPILLARY
Glucose-Capillary: 124 mg/dL — ABNORMAL HIGH (ref 70–99)
Glucose-Capillary: 121 mg/dL — ABNORMAL HIGH (ref 70–99)
Glucose-Capillary: 114 mg/dL — ABNORMAL HIGH (ref 70–99)
Glucose-Capillary: 138 mg/dL — ABNORMAL HIGH (ref 70–99)

## 2018-05-28 MED ORDER — ALUM & MAG HYDROXIDE-SIMETH 200-200-20 MG/5ML PO SUSP
30.0000 mL | Freq: Once | ORAL | Status: AC
  Administered 2018-05-29: 30 mL via ORAL
  Filled 2018-05-28: qty 30

## 2018-05-28 MED ORDER — HYDRALAZINE HCL 50 MG PO TABS
50.0000 mg | ORAL_TABLET | Freq: Three times a day (TID) | ORAL | Status: DC
  Administered 2018-05-28 – 2018-05-30 (×6): 50 mg via ORAL
  Filled 2018-05-28 (×6): qty 1

## 2018-05-28 MED ORDER — AMOXICILLIN-POT CLAVULANATE 875-125 MG PO TABS
1.0000 | ORAL_TABLET | Freq: Two times a day (BID) | ORAL | Status: DC
  Administered 2018-05-28 – 2018-05-30 (×5): 1 via ORAL
  Filled 2018-05-28 (×5): qty 1

## 2018-05-28 MED ORDER — FUROSEMIDE 10 MG/ML IJ SOLN
40.0000 mg | Freq: Three times a day (TID) | INTRAMUSCULAR | Status: DC
  Administered 2018-05-28 – 2018-05-29 (×3): 40 mg via INTRAVENOUS
  Filled 2018-05-28 (×3): qty 4

## 2018-05-28 NOTE — Plan of Care (Signed)
  Problem: Education: Goal: Knowledge of General Education information will improve Description Including pain rating scale, medication(s)/side effects and non-pharmacologic comfort measures Outcome: Progressing   Problem: Clinical Measurements: Goal: Ability to maintain clinical measurements within normal limits will improve Outcome: Progressing   Problem: Clinical Measurements: Goal: Cardiovascular complication will be avoided Outcome: Progressing   Problem: Activity: Goal: Capacity to carry out activities will improve Outcome: Progressing   Problem: Cardiac: Goal: Ability to achieve and maintain adequate cardiopulmonary perfusion will improve Outcome: Progressing

## 2018-05-28 NOTE — Progress Notes (Signed)
PROGRESS NOTE    Carla Compton  ZOX:[id number] DOB: 11-20-1980 DOA: 05/24/2018 PCP: No primary care provider on file.    Brief Narrative:  37 y.o. female with a history of IDDM, morbid obesity, HTN, and stage III CKD who presented to [facility] ED with progressive swelling of the body, mostly legs for weeks, and dyspnea worsening over a couple days. Per records, creatinine was above baseline (?2-3) at 3.3, and CXR was consistent with CHF, felt to be a new diagnosis. BNP 3518, troponin 0.29, hgb 8.8. She was transferred to [facility] for cardiology consultation  Assessment & Plan:   Principal Problem:   Acute systolic (congestive) heart failure (HCC) Active Problems:   Uncontrolled secondary diabetes mellitus with stage 4 CKD (GFR 15-29) (HCC)   Secondary DM with CKD stage 4 and hypertension (HCC)   ARF (acute renal failure) (HCC)   Normocytic anemia   CKD (chronic kidney disease) stage 4, GFR 15-29 ml/min (HCC)  Acute HFpEF:  - Cardiology following - Continue lasix as ordered, weights down, having good diuresis with modest bump in creatinine. Note BUN:Cr remains only about 10:1. Note also some element of edema will be due to hypoalbuminuria. 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  - Nephrology consulted, appreciate their recommendations.  - Monitor BMP with diuresis - Avoid nephrotoxins as able. - Needs aggressive control of HTN, DM, obesity - Continue calcitriol as tolerated - Repeat bmet in AM. Cr remains stable at this time  IDDM: Dx in 20's, initially on po meds, now on basal-bolus insulin. Fair control with HbA1c 7.2%. - Continue bolus insulin with sliding scale qAC/HS. At inpatient goal. - Continue on home statin  HTN:  - Resumed  home hydralazine since BPs up, defer to cardiology for further management. - Continue coreg - Continue with Lasix as above  Morbid obesity: BMI 50. - Dietitian consulted, education provided.  Anemia of chronic disease: No active bleeding noted. - Monitor hgb.   Elevated troponin: Reported at [facility]. Cycled and undetectable here with no chest pain. Nonischemic ECG (diffuse low voltage t waves, no STE/D on my personal review).  - Cardiology following   Elevated TSH: Mild, 5.567 with slightly low T4. - T3 pending - Could consider low dose synthroid, though this could also be euthyroid sick syndrome. -Recommend repeat TSH in 4 weeks from last check. If still c/w hypothyroid, would begin tx at that time  Alkaline phosphatase elevation: Modest, suspect FLD with DM, obesity.  - seems stable at this time  L 5th digit ulcer -Xray ordered and reviewed, no osseous findings -Consulted WOC, input appreciated   DVT prophylaxis: Heparin subQ Code Status: Full Family Communication: Pt in room, family not at bedside Disposition Plan: D/c when fully diuresed  Consultants:   Nephrology  Cardiology  Procedures:     Antimicrobials: Anti-infectives (From admission, onward)   Start     Dose/Rate Route Frequency Ordered Stop   05/28/18 1045  amoxicillin-clavulanate (AUGMENTIN) 875-125 MG per tablet 1 tablet     1 tablet Oral Every 12 hours 05/28/18 1037        Subjective: Without complaints however still complaining of continued LE edema  Objective: Vitals:   05/27/18 2111 05/28/18 0420 05/28/18 0800 05/28/18 1200  BP: (!) 136/91 (!) 144/85 135/79 127/72  Pulse: 91 93  90 88  Resp: 18 18 (!) 24 19  Temp: 98.4 F (36.9 C) 98.5 F (36.9 C) 98.7 F (37.1 C) 98.1 F (36.7 C)  TempSrc: Oral Oral Oral Oral  SpO2: 94% 96% 97% 98%  Weight:  122.8 kg    Height:        Intake/Output Summary (Last 24 hours) at 05/28/2018 1613 Last data filed at 05/28/2018 1300 Gross per 24 hour    Intake 1259 ml  Output 1550 ml  Net -291 ml   Filed Weights   05/26/18 0424 05/27/18 0340 05/28/18 0420  Weight: 123.2 kg 122.5 kg 122.8 kg    Examination: General exam: Conversant, in no acute distress Respiratory system: normal chest rise, clear, no audible wheezing Cardiovascular system: regular rhythm, s1-s2 Gastrointestinal system: Nondistended, nontender, pos BS Central nervous system: No seizures, no tremors Extremities: No cyanosis, no joint deformities Skin: No rashes, no pallor Psychiatry: Affect normal // no auditory hallucinations   Data Reviewed: I have personally reviewed following labs and imaging studies  CBC: Recent Labs  Lab 05/24/18 1928  WBC 5.7  NEUTROABS 2.7  HGB 8.8*  HCT 28.9*  MCV 91.2  PLT 416   Basic Metabolic Panel: Recent Labs  Lab 05/24/18 1928 05/25/18 0618 05/26/18 0457 05/27/18 0349 05/28/18 0442  NA 139 138 136 138 136  K 4.6 4.8 4.8 4.6 4.6  CL 112* 110 107 107 105  CO2 18* 21* 21* 20* 20*  GLUCOSE 96 85 99 136* 163*  BUN 33* 34* 35* 36* 38*  CREATININE 3.05* 3.18* 3.28* 3.29* 3.27*  CALCIUM 8.0* 8.0* 8.0* 8.1* 7.8*   GFR: Estimated Creatinine Clearance: 30 mL/min (A) (by C-G formula based on SCr of 3.27 mg/dL (H)). Liver Function Tests: Recent Labs  Lab 05/25/18 0618  AST 20  ALT 18  ALKPHOS 136*  BILITOT 0.5  PROT 6.5  ALBUMIN 2.2*   No results for input(s): LIPASE, AMYLASE in the last 168 hours. No results for input(s): AMMONIA in the last 168 hours. Coagulation Profile: No results for input(s): INR, PROTIME in the last 168 hours. Cardiac Enzymes: Recent Labs  Lab 05/24/18 1928 05/25/18 0009 05/25/18 0618  TROPONINI <0.03 <0.03 <0.03   BNP (last 3 results) No results for input(s): PROBNP in the last 8760 hours. HbA1C: Recent Labs    05/26/18 0457  HGBA1C 7.2*   CBG: Recent Labs  Lab 05/27/18 1204 05/27/18 1715 05/27/18 2131 05/28/18 0732 05/28/18 1135  GLUCAP 124* 119* 192* 124* 121*    Lipid Profile: Recent Labs    05/27/18 0349  CHOL 194  HDL 40*  LDLCALC 116*  TRIG 190*  CHOLHDL 4.9   Thyroid Function Tests: Recent Labs    05/26/18 0457  FREET4 0.71*  T3FREE 1.5*   Anemia Panel: No results for input(s): VITAMINB12, FOLATE, FERRITIN, TIBC, IRON, RETICCTPCT in the last 72 hours. Sepsis Labs: No results for input(s): PROCALCITON, LATICACIDVEN in the last 168 hours.  No results found for this or any previous visit (from the past 240 hour(s)).   Radiology Studies: Dg Foot Complete Left  Result Date: 05/27/2018 CLINICAL DATA:  Possible fifth digit fracture with pain. EXAM: LEFT FOOT - COMPLETE 3+ VIEW COMPARISON:  None. FINDINGS: Irregularity of the fifth digit skin which may be related to history of bandage. There is no soft tissue emphysema, opaque foreign body, erosion, or fracture. Dorsal soft tissue swelling of the forefoot. Osteopenia IMPRESSION: No acute osseous finding. Electronically Signed   By: Neva Seat.D.  On: 05/27/2018 14:39    Scheduled Meds: . amoxicillin-clavulanate  1 tablet Oral Q12H  . calcitRIOL  0.25 mcg Oral Once per day on Mon Wed Fri  . carvedilol  12.5 mg Oral BID WC  . dextromethorphan-guaiFENesin  1 tablet Oral BID  . furosemide  40 mg Intravenous TID  . heparin  5,000 Units Subcutaneous Q8H  . hydrALAZINE  50 mg Oral TID  . insulin aspart  0-5 Units Subcutaneous QHS  . insulin aspart  0-9 Units Subcutaneous TID WC  . mupirocin cream   Topical Daily  . rosuvastatin  40 mg Oral QHS  . sodium chloride flush  3 mL Intravenous Q12H   Continuous Infusions: . sodium chloride       LOS: 4 days   Marylu Lund, MD [facility] Pager On Amion  If 7PM-7AM, please contact night-coverage 05/28/2018, 4:13 PM

## 2018-05-28 NOTE — Progress Notes (Signed)
Progress Note  Patient Name: Carla Compton Date of Encounter: 05/28/2018  Primary Cardiologist: Pixie Casino, MD   Subjective   Continues to note improvement in breathing. No complaints of chest pain or palpitations.   Inpatient Medications    Scheduled Meds: . amoxicillin-clavulanate  1 tablet Oral Q12H  . calcitRIOL  0.25 mcg Oral Once per day on Mon Wed Fri  . carvedilol  12.5 mg Oral BID WC  . dextromethorphan-guaiFENesin  1 tablet Oral BID  . furosemide  40 mg Intravenous BID  . heparin  5,000 Units Subcutaneous Q8H  . hydrALAZINE  25 mg Oral TID  . insulin aspart  0-5 Units Subcutaneous QHS  . insulin aspart  0-9 Units Subcutaneous TID WC  . mupirocin cream   Topical Daily  . rosuvastatin  40 mg Oral QHS  . sodium chloride flush  3 mL Intravenous Q12H   Continuous Infusions: . sodium chloride     PRN Meds: sodium chloride, acetaminophen, albuterol, ondansetron (ZOFRAN) IV, sodium chloride flush   Vital Signs    Vitals:   05/27/18 1709 05/27/18 2111 05/28/18 0420 05/28/18 0800  BP: 138/90 (!) 136/91 (!) 144/85 135/79  Pulse: 86 91 93 90  Resp: 18 18 18  (!) 24  Temp:  98.4 F (36.9 C) 98.5 F (36.9 C) 98.7 F (37.1 C)  TempSrc:  Oral Oral Oral  SpO2: 100% 94% 96% 97%  Weight:   122.8 kg   Height:        Intake/Output Summary (Last 24 hours) at 05/28/2018 1058 Last data filed at 05/28/2018 0420 Gross per 24 hour  Intake 720 ml  Output 900 ml  Net -180 ml   Filed Weights   05/26/18 0424 05/27/18 0340 05/28/18 0420  Weight: 123.2 kg 122.5 kg 122.8 kg    Telemetry    NSR - Personally Reviewed  Physical Exam   GEN: Sitting on the edge of the hospital bed in no acute distress.   Neck: difficult to assess JVD due to body habitus, no carotid bruits Cardiac: RRR, no murmurs, rubs, or gallops.  Respiratory: crackles at lung bases GI: NABS, Soft, obese, nontender, non-distended  MS: 2-3+ b/l LE edema; No deformity. Neuro:  Nonfocal, moving  all extremities spontaneously Psych: Normal affect   Labs    Chemistry Recent Labs  Lab 05/25/18 0618 05/26/18 0457 05/27/18 0349 05/28/18 0442  NA 138 136 138 136  K 4.8 4.8 4.6 4.6  CL 110 107 107 105  CO2 21* 21* 20* 20*  GLUCOSE 85 99 136* 163*  BUN 34* 35* 36* 38*  CREATININE 3.18* 3.28* 3.29* 3.27*  CALCIUM 8.0* 8.0* 8.1* 7.8*  PROT 6.5  --   --   --   ALBUMIN 2.2*  --   --   --   AST 20  --   --   --   ALT 18  --   --   --   ALKPHOS 136*  --   --   --   BILITOT 0.5  --   --   --   GFRNONAA 18* 17* 17* 17*  GFRAA 21* 20* 20* 20*  ANIONGAP 7 8 11 11      Hematology Recent Labs  Lab 05/24/18 1928  WBC 5.7  RBC 3.17*  HGB 8.8*  HCT 28.9*  MCV 91.2  MCH 27.8  MCHC 30.4  RDW 14.8  PLT 326    Cardiac Enzymes Recent Labs  Lab 05/24/18 1928 05/25/18 0009 05/25/18 4696  TROPONINI <0.03 <0.03 <0.03   No results for input(s): TROPIPOC in the last 168 hours.   BNPNo results for input(s): BNP, PROBNP in the last 168 hours.   DDimer No results for input(s): DDIMER in the last 168 hours.   Radiology    Dg Foot Complete Left  Result Date: 05/27/2018 CLINICAL DATA:  Possible fifth digit fracture with pain. EXAM: LEFT FOOT - COMPLETE 3+ VIEW COMPARISON:  None. FINDINGS: Irregularity of the fifth digit skin which may be related to history of bandage. There is no soft tissue emphysema, opaque foreign body, erosion, or fracture. Dorsal soft tissue swelling of the forefoot. Osteopenia IMPRESSION: No acute osseous finding. Electronically Signed   By: Monte Fantasia M.D.   On: 05/27/2018 14:39    Cardiac Studies   Echocardiogram 05/25/18: Study Conclusions  - Left ventricle: The cavity size was mildly dilated. There was mild concentric hypertrophy. Systolic function was mildly reduced. The estimated ejection fraction was in the range of 45% to 50%. Diffuse hypokinesis. Features are consistent with a pseudonormal left ventricular filling pattern, with  concomitant abnormal relaxation and increased filling pressure (grade 2 diastolic dysfunction). - Left atrium: The atrium was mildly dilated.  Patient Profile     37 y.o.femalewith a hx of DM, HTN,RA,morbid obesity, GERD, lymphedema,CKD-3who is being followed by cardiology for the evaluation of CHF.  Assessment & Plan    1. Acute systolic CHF: patient presented with SOB. CXR with pulmonary edema. BNP 3500. Echo with EF 45-50%, G2DD, and diffuse hypokinesis. She was started on IV lasix 40mg  BID with UOP net -85mL in the past 24 hours and net -3.9L this admission. Weight 275lbs on admission to 270lbs today.  She was started on a carvedilol but unable to add ACEi/ARB due to CKD. Hypoalbuminemia likely contributing to edema as well. Cr stable at 3.2 today. Still with crackles at lung bases and 2+ LE edema on exam today - Continue IV lasix 40mg  BID - Continue to monitor strict I&Os and daily weights - Continue carvedilol 12.5mg  BID  2. HTN: BP stable but not at goal of <130/80 despite addition of carvedilol this admission. Likely poorly controlled HTN is contributing to #1 - Will increase hydralazine to 50mg  TID for better BP control - Continue carvedilol 12.5mg  BID  3. DM type 2: Hgb A1C 7.2 on insulin - Continue management per primary team   4. Acute on chronic renal insufficiency stage 3: Cr stable at 3.2 today. Baseline appears to be about 2 as of 01/2018.  - Continue to monitor closely with IV diuresis.   For questions or updates, please contact [place] Please consult [email or web address] for contact info under Cardiology/STEMI.      Signed, Abigail Butts, PA-C  05/28/2018, 10:58 AM   [phone]

## 2018-05-28 NOTE — Progress Notes (Signed)
[facility] Progress Note    Assessment/ Plan:   1. 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  Baseline creatinine 2.2 as of 01/2018.  She has had evidence before of glomerular hyperfiltration (Scr 0.4-0.6 several years ago).  Most recent episode of AKI likely hemodynamically mediated in setting of acute CHF exacerbation.   Albumin is 2.2 and therefore will likely have third spacing, and she has nephrotic range proteinruria of 10g on UP/C.  Lasix increased to 40 IV BID, would continue and ensure she's adhering to fluid restriction.  Will likely require dialysis in the near future but I do not anticipate during this hospitalization.  Of note, gliflozins (SGLT2i) are gaining popularity as agents to slow the progression of diabetic glomerular sclerosis--> however I do not recommend this now as her eGFR is too low and her diuresis is in flux.  2.  Acute on chronic systolic and diastolic CHF exacerbation: mildy elevated trops, not consistent with ACS, cardiology following, TTE with EF 45-50% and G2DD  3.  HTN: would not start ACEi/ARB in setting of AKI on CKD  4.  Dispo: pending improvement in vol status, Cr  Subjective:    Continues diuresis   Objective:   BP 127/72 (BP Location: Left Arm)   Pulse 88   Temp 98.1 F (36.7 C) (Oral)   Resp 19   Ht _0  (1.575 m)   Wt 122.8 kg   LMP 04/29/2018   SpO2 98%   BMI 49.53 kg/m   Intake/Output Summary (Last 24 hours) at 05/28/2018 1313 Last data filed at 05/28/2018 1300 Gross per 24 hour  Intake 1259 ml  Output 1550 ml  Net -291 ml   Weight change: 0.318 kg  Physical Exam: GEN morbidly obese, NAD HEENT EOMI PERRL NECK thick neck, unable to appreciate JVD PULM normal WOB, muffled at bases with few crackles CV RRR no m/r/g ABD obese EXT 3+ LE edema NEURO AAO x 3 nonfocal  Imaging: Dg Foot Complete Left  Result Date: 05/27/2018 CLINICAL DATA:  Possible  fifth digit fracture with pain. EXAM: LEFT FOOT - COMPLETE 3+ VIEW COMPARISON:  None. FINDINGS: Irregularity of the fifth digit skin which may be related to history of bandage. There is no soft tissue emphysema, opaque foreign body, erosion, or fracture. Dorsal soft tissue swelling of the forefoot. Osteopenia IMPRESSION: No acute osseous finding. Electronically Signed   By: Monte Fantasia M.D.   On: 05/27/2018 14:39    Labs: BMET Recent Labs  Lab 05/24/18 1928 05/25/18 0618 05/26/18 0457 05/27/18 0349 05/28/18 0442  NA 139 138 136 138 136  K 4.6 4.8 4.8 4.6 4.6  CL 112* 110 107 107 105  CO2 18* 21* 21* 20* 20*  GLUCOSE 96 85 99 136* 163*  BUN 33* 34* 35* 36* 38*  CREATININE 3.05* 3.18* 3.28* 3.29* 3.27*  CALCIUM 8.0* 8.0* 8.0* 8.1* 7.8*   CBC Recent Labs  Lab 05/24/18 1928  WBC 5.7  NEUTROABS 2.7  HGB 8.8*  HCT 28.9*  MCV 91.2  PLT 326    Medications:    . amoxicillin-clavulanate  1 tablet Oral Q12H  . calcitRIOL  0.25 mcg Oral Once per day on Mon Wed Fri  . carvedilol  12.5 mg Oral BID WC  . dextromethorphan-guaiFENesin  1 tablet Oral BID  . furosemide  40 mg Intravenous TID  . heparin  5,000 Units Subcutaneous Q8H  . hydrALAZINE  50 mg Oral TID  .  insulin aspart  0-5 Units Subcutaneous QHS  . insulin aspart  0-9 Units Subcutaneous TID WC  . mupirocin cream   Topical Daily  . rosuvastatin  40 mg Oral QHS  . sodium chloride flush  3 mL Intravenous Q12H      Madelon Lips, MD 05/28/2018, 1:13 PM

## 2018-05-29 DIAGNOSIS — I129 Hypertensive chronic kidney disease with stage 1 through stage 4 chronic kidney disease, or unspecified chronic kidney disease: Secondary | ICD-10-CM

## 2018-05-29 LAB — BASIC METABOLIC PANEL
Anion gap: 10 (ref 5–15)
BUN: 38 mg/dL — ABNORMAL HIGH (ref 6–20)
CO2: 21 mmol/L — ABNORMAL LOW (ref 22–32)
Calcium: 7.9 mg/dL — ABNORMAL LOW (ref 8.9–10.3)
Chloride: 107 mmol/L (ref 98–111)
Creatinine, Ser: 3.88 mg/dL — ABNORMAL HIGH (ref 0.44–1.00)
GFR calc Af Amer: 16 mL/min — ABNORMAL LOW (ref >60)
GFR calc non Af Amer: 14 mL/min — ABNORMAL LOW (ref >60)
GLUCOSE: 112 mg/dL — AB (ref 70–99)
Potassium: 4.5 mmol/L (ref 3.5–5.1)
Sodium: 138 mmol/L (ref 135–145)

## 2018-05-29 LAB — GLUCOSE, CAPILLARY
Glucose-Capillary: 115 mg/dL — ABNORMAL HIGH (ref 70–99)
Glucose-Capillary: 123 mg/dL — ABNORMAL HIGH (ref 70–99)
Glucose-Capillary: 124 mg/dL — ABNORMAL HIGH (ref 70–99)
Glucose-Capillary: 112 mg/dL — ABNORMAL HIGH (ref 70–99)

## 2018-05-29 MED ORDER — FUROSEMIDE 10 MG/ML IJ SOLN: 40.0000 mg | Freq: Two times a day (BID) | INTRAMUSCULAR | Status: DC

## 2018-05-29 MED ORDER — TORSEMIDE 20 MG PO TABS
40.0000 mg | ORAL_TABLET | Freq: Every day | ORAL | Status: DC
  Administered 2018-05-30: 40 mg via ORAL
  Filled 2018-05-29: qty 2

## 2018-05-29 NOTE — Progress Notes (Signed)
Progress Note  Patient Name: Carla Compton Date of Encounter: 05/29/2018  Primary Cardiologist: Pixie Casino, MD   Subjective   No complaints this morning. She feels like she has more energy to move around today. Denies chest pain, SOB, or palpitations.   Inpatient Medications    Scheduled Meds: . amoxicillin-clavulanate  1 tablet Oral Q12H  . calcitRIOL  0.25 mcg Oral Once per day on Mon Wed Fri  . carvedilol  12.5 mg Oral BID WC  . dextromethorphan-guaiFENesin  1 tablet Oral BID  . furosemide  40 mg Intravenous BID  . heparin  5,000 Units Subcutaneous Q8H  . hydrALAZINE  50 mg Oral TID  . insulin aspart  0-5 Units Subcutaneous QHS  . insulin aspart  0-9 Units Subcutaneous TID WC  . mupirocin cream   Topical Daily  . rosuvastatin  40 mg Oral QHS  . sodium chloride flush  3 mL Intravenous Q12H   Continuous Infusions: . sodium chloride     PRN Meds: sodium chloride, acetaminophen, albuterol, ondansetron (ZOFRAN) IV, sodium chloride flush   Vital Signs    Vitals:   05/28/18 2059 05/28/18 2142 05/29/18 0215 05/29/18 0613  BP: (!) 146/93 131/78  (!) 144/86  Pulse: 89   84  Resp: 19   18  Temp: 98.4 F (36.9 C)   97.9 F (36.6 C)  TempSrc: Oral   Oral  SpO2: 96%   99%  Weight:   121.7 kg   Height:        Intake/Output Summary (Last 24 hours) at 05/29/2018 0940 Last data filed at 05/29/2018 0900 Gross per 24 hour  Intake 1600 ml  Output 3325 ml  Net -1725 ml   Filed Weights   05/27/18 0340 05/28/18 0420 05/29/18 0215  Weight: 122.5 kg 122.8 kg 121.7 kg    Telemetry    NSR - Personally Reviewed  Physical Exam   GEN: Sitting on the edge of the hospital bed in no acute distress.   Neck:  no carotid bruits Cardiac: RRR, no murmurs, rubs, or gallops.  Respiratory: mild crackles at lung bases GI: NABS, Soft, obese, nontender, non-distended  MS: 2+ L>R LE edema; No deformity. Neuro:  Nonfocal, moving all extremities spontaneously Psych: Normal  affect   Labs    Chemistry Recent Labs  Lab 05/25/18 0618  05/27/18 0349 05/28/18 0442 05/29/18 0550  NA 138   < > 138 136 138  K 4.8   < > 4.6 4.6 4.5  CL 110   < > 107 105 107  CO2 21*   < > 20* 20* 21*  GLUCOSE 85   < > 136* 163* 112*  BUN 34*   < > 36* 38* 38*  CREATININE 3.18*   < > 3.29* 3.27* 3.88*  CALCIUM 8.0*   < > 8.1* 7.8* 7.9*  PROT 6.5  --   --   --   --   ALBUMIN 2.2*  --   --   --   --   AST 20  --   --   --   --   ALT 18  --   --   --   --   ALKPHOS 136*  --   --   --   --   BILITOT 0.5  --   --   --   --   GFRNONAA 18*   < > 17* 17* 14*  GFRAA 21*   < > 20* 20* 16*  ANIONGAP 7   < >  11 11 10    < > = values in this interval not displayed.     Hematology Recent Labs  Lab 05/24/18 1928  WBC 5.7  RBC 3.17*  HGB 8.8*  HCT 28.9*  MCV 91.2  MCH 27.8  MCHC 30.4  RDW 14.8  PLT 326    Cardiac Enzymes Recent Labs  Lab 05/24/18 1928 05/25/18 0009 05/25/18 0618  TROPONINI <0.03 <0.03 <0.03   No results for input(s): TROPIPOC in the last 168 hours.   BNPNo results for input(s): BNP, PROBNP in the last 168 hours.   DDimer No results for input(s): DDIMER in the last 168 hours.   Radiology    Dg Foot Complete Left  Result Date: 05/27/2018 CLINICAL DATA:  Possible fifth digit fracture with pain. EXAM: LEFT FOOT - COMPLETE 3+ VIEW COMPARISON:  None. FINDINGS: Irregularity of the fifth digit skin which may be related to history of bandage. There is no soft tissue emphysema, opaque foreign body, erosion, or fracture. Dorsal soft tissue swelling of the forefoot. Osteopenia IMPRESSION: No acute osseous finding. Electronically Signed   By: Monte Fantasia M.D.   On: 05/27/2018 14:39    Cardiac Studies   Echocardiogram 05/25/18: Study Conclusions  - Left ventricle: The cavity size was mildly dilated. There was mild concentric hypertrophy. Systolic function was mildly reduced. The estimated ejection fraction was in the range of 45% to 50%.  Diffuse hypokinesis. Features are consistent with a pseudonormal left ventricular filling pattern, with concomitant abnormal relaxation and increased filling pressure (grade 2 diastolic dysfunction). - Left atrium: The atrium was mildly dilated.  Patient Profile     37 y.o.femalewith a hx of DM, HTN,RA,morbid obesity, GERD, lymphedema,CKD-3who is being followed by cardiology for the evaluation of CHF.  Assessment & Plan    1. Acute systolic CHF: patient presented with SOB. CXR with pulmonary edema. BNP 3500. Echo with EF 45-50%, G2DD, and diffuse hypokinesis. Lasix was increased to 40mg  TID with UOP net -1.5L in the past 24 hours and net -5.4L this admission. Weight 275lbs on admission to 268lbs today.  She was started on a carvedilol but unable to add ACEi/ARB due to CKD. Hypoalbuminemia likely contributing to edema as well. Cr bumped to 3.88 today. Crackles improved from yesterday but still present - Will decrease lasix to 40mg  BID given bump in Cr - possible transition to po tomorrow - continue to monitor strict I&Os and daily weights. - Limit fluid intake to 1.5L  - Continue carvedilol 12.5mg  BID  2. HTN: BP continues to be elevated but is overall stable after increasing hydralazine to 50mg  TID yesterday.  - Continue hydralazine and carvedilol at current dose for now  3. DM type 2: Hgb A1C 7.2. On insulin - Continue management per primary team  4. Acute on chronic renal insufficiency stage 3: Cr up to 3.88 today (baseline ~2). Nephrology following for nephrotic syndrome 2/2 diabetic glomerulosclerosis. Nephro anticipates need for dialysis in the near future but likely not this admission.  - Continue to monitor Cr closely with IV diuresis.   For questions or updates, please contact [place] Please consult [email or web address] for contact info under Cardiology/STEMI.      Signed, Abigail Butts, PA-C  05/29/2018, 9:40 AM   [phone]

## 2018-05-29 NOTE — Progress Notes (Signed)
PROGRESS NOTE    Carla Compton  XLK:[id number] DOB: [birth year] DOA: 05/24/2018 PCP: No primary care provider on file.    Brief Narrative:  37 y.o. female with a history of IDDM, morbid obesity, HTN, and stage III CKD who presented to [facility] ED with progressive swelling of the body, mostly legs for weeks, and dyspnea worsening over a couple days. Per records, creatinine was above baseline (?2-3) at 3.3, and CXR was consistent with CHF, felt to be a new diagnosis. BNP 3518, troponin 0.29, hgb 8.8. She was transferred to [facility] for cardiology consultation  Assessment & Plan:   Principal Problem:   Acute systolic (congestive) heart failure (HCC) Active Problems:   Uncontrolled secondary diabetes mellitus with stage 4 CKD (GFR 15-29) (HCC)   Secondary DM with CKD stage 4 and hypertension (HCC)   ARF (acute renal failure) (HCC)   Normocytic anemia   CKD (chronic kidney disease) stage 4, GFR 15-29 ml/min (HCC)  Acute HFpEF:  - Cardiology following - Continue lasix as ordered, weights down, having good diuresis with modest bump in creatinine. Note BUN:Cr remains only about 10:1. Note also some element of edema will be due to hypoalbuminuria. 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  - Nephrology consulted, appreciate their recommendations.  - Monitor BMP with diuresis - Avoid nephrotoxins as able. - Needs aggressive control of HTN, DM, obesity - Continue calcitriol as tolerated - Repeat bmet in AM. Cr remains stable at this time  IDDM: Dx in 20's, initially on po meds, now on basal-bolus insulin. Fair control with HbA1c 7.2%. - Continue bolus insulin with sliding scale qAC/HS. At inpatient goal. - Continue on home statin  HTN:  - Resumed home hydralazine  since BPs up, defer to cardiology for further management. - Continue coreg - Continue with Lasix as above  Morbid obesity: BMI 50. - Dietitian consulted, education provided.  Anemia of chronic disease: No active bleeding noted. - Monitor hgb.   Elevated troponin: Reported at [facility]. Cycled and undetectable here with no chest pain. Nonischemic ECG (diffuse low voltage t waves, no STE/D on my personal review).  - Cardiology following   Elevated TSH: Mild, 5.567 with slightly low T4. - T3 pending - Could consider low dose synthroid, though this could also be euthyroid sick syndrome. -Recommend repeat TSH in 4 weeks from last check. If still c/w hypothyroid, would begin tx at that time  Alkaline phosphatase elevation: Modest, suspect FLD with DM, obesity.  - seems stable at this time  L 5th digit ulcer -Xray ordered and reviewed, no osseous findings -Consulted WOC, input appreciated -Patient reports one month hx of recurring ulcer -Would treat with augmentin x 7-10 days. If no improvement, consider imaging at that time. Patient has outpatient wound clinic visit scheduled for later this week   DVT prophylaxis: Heparin subQ Code Status: Full Family Communication: Pt in room, family not at bedside Disposition Plan: D/c when fully diuresed  Consultants:   Nephrology  Cardiology  Procedures:     Antimicrobials: Anti-infectives (From admission, onward)   Start     Dose/Rate Route Frequency Ordered Stop   05/28/18 1045  amoxicillin-clavulanate (AUGMENTIN) 875-125 MG per tablet 1 tablet     1 tablet Oral Every 12 hours 05/28/18 1037        Subjective: No complaints  this AM. Eager to go home soon  Objective: Vitals:   05/28/18 2142 05/29/18 0215 05/29/18 0613 05/29/18 1327  BP: 131/78  (!) 144/86 (!) 158/99  Pulse:   84 81  Resp:   18 18  Temp:   97.9 F (36.6 C) 97.6 F (36.4 C)  TempSrc:   Oral Oral  SpO2:   99% 97%  Weight:  121.7 kg    Height:         Intake/Output Summary (Last 24 hours) at 05/29/2018 1449 Last data filed at 05/29/2018 1300 Gross per 24 hour  Intake 1240 ml  Output 2675 ml  Net -1435 ml   Filed Weights   05/27/18 0340 05/28/18 0420 05/29/18 0215  Weight: 122.5 kg 122.8 kg 121.7 kg    Examination: General exam: Awake, laying in bed, in nad Respiratory system: Normal respiratory effort, no wheezing Cardiovascular system: regular rate, s1, s2 Gastrointestinal system: Soft, nondistended, positive BS Central nervous system: CN2-12 grossly intact, strength intact Extremities: Perfused, no clubbing Skin: Normal skin turgor, no rashes Psychiatry: Mood normal // no visual hallucinations   Data Reviewed: I have personally reviewed following labs and imaging studies  CBC: Recent Labs  Lab 05/24/18 1928  WBC 5.7  NEUTROABS 2.7  HGB 8.8*  HCT 28.9*  MCV 91.2  PLT 262   Basic Metabolic Panel: Recent Labs  Lab 05/25/18 0618 05/26/18 0457 05/27/18 0349 05/28/18 0442 05/29/18 0550  NA 138 136 138 136 138  K 4.8 4.8 4.6 4.6 4.5  CL 110 107 107 105 107  CO2 21* 21* 20* 20* 21*  GLUCOSE 85 99 136* 163* 112*  BUN 34* 35* 36* 38* 38*  CREATININE 3.18* 3.28* 3.29* 3.27* 3.88*  CALCIUM 8.0* 8.0* 8.1* 7.8* 7.9*   GFR: Estimated Creatinine Clearance: 25.1 mL/min (A) (by C-G formula based on SCr of 3.88 mg/dL (H)). Liver Function Tests: Recent Labs  Lab 05/25/18 0618  AST 20  ALT 18  ALKPHOS 136*  BILITOT 0.5  PROT 6.5  ALBUMIN 2.2*   No results for input(s): LIPASE, AMYLASE in the last 168 hours. No results for input(s): AMMONIA in the last 168 hours. Coagulation Profile: No results for input(s): INR, PROTIME in the last 168 hours. Cardiac Enzymes: Recent Labs  Lab 05/24/18 1928 05/25/18 0009 05/25/18 0618  TROPONINI <0.03 <0.03 <0.03   BNP (last 3 results) No results for input(s): PROBNP in the last 8760 hours. HbA1C: No results for input(s): HGBA1C in the last 72 hours. CBG: Recent  Labs  Lab 05/28/18 1135 05/28/18 1703 05/28/18 2107 05/29/18 0804 05/29/18 1123  GLUCAP 121* 114* 138* 115* 123*   Lipid Profile: Recent Labs    05/27/18 0349  CHOL 194  HDL 40*  LDLCALC 116*  TRIG 190*  CHOLHDL 4.9   Thyroid Function Tests: No results for input(s): TSH, T4TOTAL, FREET4, T3FREE, THYROIDAB in the last 72 hours. Anemia Panel: No results for input(s): VITAMINB12, FOLATE, FERRITIN, TIBC, IRON, RETICCTPCT in the last 72 hours. Sepsis Labs: No results for input(s): PROCALCITON, LATICACIDVEN in the last 168 hours.  No results found for this or any previous visit (from the past 240 hour(s)).   Radiology Studies: No results found.  Scheduled Meds: . amoxicillin-clavulanate  1 tablet Oral Q12H  . calcitRIOL  0.25 mcg Oral Once per day on Mon Wed Fri  . carvedilol  12.5 mg Oral BID WC  . dextromethorphan-guaiFENesin  1 tablet Oral BID  . heparin  5,000 Units Subcutaneous Q8H  . hydrALAZINE  50 mg Oral TID  . insulin aspart  0-5 Units Subcutaneous QHS  . insulin aspart  0-9 Units Subcutaneous TID WC  . mupirocin cream   Topical Daily  . rosuvastatin  40 mg Oral QHS  . sodium chloride flush  3 mL Intravenous Q12H  . [START ON 05/30/2018] torsemide  40 mg Oral Daily   Continuous Infusions: . sodium chloride       LOS: 5 days   Marylu Lund, MD [facility] Pager On Amion  If 7PM-7AM, please contact night-coverage 05/29/2018, 2:49 PM

## 2018-05-29 NOTE — Progress Notes (Signed)
[facility] Progress Note    Assessment/ Plan:   1. 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  Baseline creatinine 2.2 as of 01/2018.  She has had evidence before of glomerular hyperfiltration (Scr 0.4-0.6 several years ago).  Most recent episode of AKI likely hemodynamically mediated in setting of acute CHF exacerbation.   Albumin is 2.2 and therefore will likely have third spacing, and she has nephrotic range proteinruria of 10g on UP/C.  Lasix was increased to 40 IV BID--> weights are down and I agree with transition to torsemide tomorrow.     Will likely require dialysis in the near future but I do not anticipate during this hospitalization.  Of note, gliflozins (SGLT2i) are gaining popularity as agents to slow the progression of diabetic glomerular sclerosis--> however I do not recommend this now as her eGFR is too low and her diuresis is in flux.  I expect Cr to fluctuate a bit as we diurese.  2.  Acute on chronic systolic and diastolic CHF exacerbation: mildy elevated trops, not consistent with ACS, cardiology following, TTE with EF 45-50% and G2DD  3.  HTN: would not start ACEi/ARB in setting of AKI on CKD  4.  Dispo: pending improvement in vol status, Cr  Subjective:    No complaints this AM.  Had 3.3L urine out with IV Lasix.     Objective:   BP (!) 158/99 (BP Location: Left Arm)   Pulse 81   Temp 97.6 F (36.4 C) (Oral)   Resp 18   Ht _0  (1.575 m)   Wt 121.7 kg   LMP 04/29/2018   SpO2 97%   BMI 49.05 kg/m   Intake/Output Summary (Last 24 hours) at 05/29/2018 1401 Last data filed at 05/29/2018 1300 Gross per 24 hour  Intake 1240 ml  Output 2675 ml  Net -1435 ml   Weight change: -1.179 kg  Physical Exam: GEN morbidly obese, NAD HEENT EOMI PERRL NECK thick neck, unable to appreciate JVD PULM normal WOB, crackles improved CV RRR no m/r/g ABD obese EXT 2+ LE edema, improving NEURO AAO  x 3 nonfocal  Imaging: No results found.  Labs: BMET Recent Labs  Lab 05/24/18 1928 05/25/18 0618 05/26/18 0457 05/27/18 0349 05/28/18 0442 05/29/18 0550  NA 139 138 136 138 136 138  K 4.6 4.8 4.8 4.6 4.6 4.5  CL 112* 110 107 107 105 107  CO2 18* 21* 21* 20* 20* 21*  GLUCOSE 96 85 99 136* 163* 112*  BUN 33* 34* 35* 36* 38* 38*  CREATININE 3.05* 3.18* 3.28* 3.29* 3.27* 3.88*  CALCIUM 8.0* 8.0* 8.0* 8.1* 7.8* 7.9*   CBC Recent Labs  Lab 05/24/18 1928  WBC 5.7  NEUTROABS 2.7  HGB 8.8*  HCT 28.9*  MCV 91.2  PLT 326    Medications:    . amoxicillin-clavulanate  1 tablet Oral Q12H  . calcitRIOL  0.25 mcg Oral Once per day on Mon Wed Fri  . carvedilol  12.5 mg Oral BID WC  . dextromethorphan-guaiFENesin  1 tablet Oral BID  . heparin  5,000 Units Subcutaneous Q8H  . hydrALAZINE  50 mg Oral TID  . insulin aspart  0-5 Units Subcutaneous QHS  . insulin aspart  0-9 Units Subcutaneous TID WC  . mupirocin cream   Topical Daily  . rosuvastatin  40 mg Oral QHS  . sodium chloride flush  3 mL Intravenous Q12H  . [START ON 05/30/2018] torsemide  40 mg Oral Daily      Madelon Lips, MD 05/29/2018, 2:01 PM

## 2018-05-30 DIAGNOSIS — N171 Acute kidney failure with acute cortical necrosis: Secondary | ICD-10-CM

## 2018-05-30 LAB — BASIC METABOLIC PANEL
ANION GAP: 11 (ref 5–15)
BUN: 39 mg/dL — ABNORMAL HIGH (ref 6–20)
CHLORIDE: 107 mmol/L (ref 98–111)
CO2: 20 mmol/L — ABNORMAL LOW (ref 22–32)
Calcium: 8 mg/dL — ABNORMAL LOW (ref 8.9–10.3)
Creatinine, Ser: 3.98 mg/dL — ABNORMAL HIGH (ref 0.44–1.00)
GFR calc Af Amer: 16 mL/min — ABNORMAL LOW (ref >60)
GFR calc non Af Amer: 14 mL/min — ABNORMAL LOW (ref >60)
Glucose, Bld: 90 mg/dL (ref 70–99)
Potassium: 4.6 mmol/L (ref 3.5–5.1)
Sodium: 138 mmol/L (ref 135–145)

## 2018-05-30 LAB — RNA QUALITATIVE: HIV 1 RNA Qualitative: 1

## 2018-05-30 LAB — GLUCOSE, CAPILLARY
GLUCOSE-CAPILLARY: 76 mg/dL (ref 70–99)
Glucose-Capillary: 92 mg/dL (ref 70–99)

## 2018-05-30 LAB — HIV 1/2 AB DIFFERENTIATION
HIV 1 Ab: NEGATIVE
HIV 2 Ab: NEGATIVE
Note: NEGATIVE

## 2018-05-30 LAB — HIV ANTIBODY (ROUTINE TESTING W REFLEX): HIV Screen 4th Generation wRfx: REACTIVE — AB

## 2018-05-30 MED ORDER — AMOXICILLIN-POT CLAVULANATE 875-125 MG PO TABS: 1.0000 | ORAL_TABLET | Freq: Two times a day (BID) | ORAL | 0 refills | Status: DC

## 2018-05-30 MED ORDER — TORSEMIDE 20 MG PO TABS: 40.0000 mg | ORAL_TABLET | Freq: Every day | ORAL | 0 refills | Status: DC

## 2018-05-30 MED ORDER — CALCITRIOL 0.25 MCG PO CAPS: 0.2500 ug | ORAL_CAPSULE | ORAL | 0 refills | Status: AC

## 2018-05-30 MED ORDER — CARVEDILOL 12.5 MG PO TABS: 12.5000 mg | ORAL_TABLET | Freq: Two times a day (BID) | ORAL | 0 refills | Status: DC

## 2018-05-30 MED ORDER — HYDRALAZINE HCL 50 MG PO TABS: 50.0000 mg | ORAL_TABLET | Freq: Three times a day (TID) | ORAL | 0 refills | Status: DC

## 2018-05-30 NOTE — Progress Notes (Signed)
[facility] Progress Note    Assessment/ Plan:   1. 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  Baseline creatinine 2.2 as of 01/2018.  She has had evidence before of glomerular hyperfiltration (Scr 0.4-0.6 several years ago).  Most recent episode of AKI likely hemodynamically mediated in setting of acute CHF exacerbation.   Albumin is 2.2 and therefore will likely have third spacing, and she has nephrotic range proteinruria of 10g on UP/C.  Switched to torsemide 40 mg daily.   Will likely require dialysis in the near future, encouraged her to have close followup with her nephrologist.  Of note, gliflozins (SGLT2i) are gaining popularity as agents to slow the progression of diabetic glomerular sclerosis--> however I do not recommend this now as her eGFR is too low.  2.  Acute on chronic systolic and diastolic CHF exacerbation: mildy elevated trops, not consistent with ACS, cardiology following, TTE with EF 45-50% and G2DD  3.  HTN: would not start ACEi/ARB in setting of AKI on CKD  4.  Dispo: on oral torsemide, OK for d/c today from renal perspective.  Subjective:    For d/c today but hasn't walked in the halls yet.     Objective:   BP (!) 149/90 (BP Location: Left Arm)   Pulse 87   Temp 98.4 F (36.9 C) (Oral)   Resp 18   Ht '5\' 2"'  (1.575 m)   Wt 121.1 kg Comment: Scale C  LMP 04/29/2018   SpO2 98%   BMI 48.82 kg/m   Intake/Output Summary (Last 24 hours) at 05/30/2018 1245 Last data filed at 05/30/2018 1003 Gross per 24 hour  Intake 723 ml  Output 1400 ml  Net -677 ml   Weight change: -0.59 kg  Physical Exam: GEN morbidly obese, NAD HEENT EOMI PERRL NECK thick neck, unable to appreciate JVD PULM normal WOB, crackles improved and nearly gone CV RRR no m/r/g ABD obese EXT 1+ LE edema, improving NEURO AAO x 3 nonfocal  Imaging: No results found.  Labs: BMET Recent Labs  Lab 05/24/18 1928  05/25/18 0618 05/26/18 0457 05/27/18 0349 05/28/18 0442 05/29/18 0550 05/30/18 0521  NA 139 138 136 138 136 138 138  K 4.6 4.8 4.8 4.6 4.6 4.5 4.6  CL 112* 110 107 107 105 107 107  CO2 18* 21* 21* 20* 20* 21* 20*  GLUCOSE 96 85 99 136* 163* 112* 90  BUN 33* 34* 35* 36* 38* 38* 39*  CREATININE 3.05* 3.18* 3.28* 3.29* 3.27* 3.88* 3.98*  CALCIUM 8.0* 8.0* 8.0* 8.1* 7.8* 7.9* 8.0*   CBC Recent Labs  Lab 05/24/18 1928  WBC 5.7  NEUTROABS 2.7  HGB 8.8*  HCT 28.9*  MCV 91.2  PLT 326    Medications:    . amoxicillin-clavulanate  1 tablet Oral Q12H  . calcitRIOL  0.25 mcg Oral Once per day on Mon Wed Fri  . carvedilol  12.5 mg Oral BID WC  . dextromethorphan-guaiFENesin  1 tablet Oral BID  . heparin  5,000 Units Subcutaneous Q8H  . hydrALAZINE  50 mg Oral TID  . insulin aspart  0-5 Units Subcutaneous QHS  . insulin aspart  0-9 Units Subcutaneous TID WC  . mupirocin cream   Topical Daily  . rosuvastatin  40 mg Oral QHS  . sodium chloride flush  3 mL Intravenous Q12H  . torsemide  40 mg Oral Daily      Madelon Lips, MD 05/30/2018, 12:45 PM

## 2018-05-30 NOTE — Progress Notes (Signed)
Progress Note  Patient Name: Carla Compton Date of Encounter: 05/30/2018  Primary Cardiologist: Pixie Casino, MD   Subjective   Feels like a lot of fluid has come off, feels much better than when she came in. Wants to know if she can go home.  Inpatient Medications    Scheduled Meds: . amoxicillin-clavulanate  1 tablet Oral Q12H  . calcitRIOL  0.25 mcg Oral Once per day on Mon Wed Fri  . carvedilol  12.5 mg Oral BID WC  . dextromethorphan-guaiFENesin  1 tablet Oral BID  . heparin  5,000 Units Subcutaneous Q8H  . hydrALAZINE  50 mg Oral TID  . insulin aspart  0-5 Units Subcutaneous QHS  . insulin aspart  0-9 Units Subcutaneous TID WC  . mupirocin cream   Topical Daily  . rosuvastatin  40 mg Oral QHS  . sodium chloride flush  3 mL Intravenous Q12H  . torsemide  40 mg Oral Daily   Continuous Infusions: . sodium chloride     PRN Meds: sodium chloride, acetaminophen, albuterol, ondansetron (ZOFRAN) IV, sodium chloride flush   Vital Signs    Vitals:   05/29/18 0613 05/29/18 1327 05/29/18 2017 05/30/18 0539  BP: (!) 144/86 (!) 158/99 133/77 139/75  Pulse: 84 81 86 83  Resp: 18 18 19 14   Temp: 97.9 F (36.6 C) 97.6 F (36.4 C) 97.8 F (36.6 C) 98 F (36.7 C)  TempSrc: Oral Oral Oral Oral  SpO2: 99% 97% 98% 98%  Weight:    121.1 kg  Height:        Intake/Output Summary (Last 24 hours) at 05/30/2018 0929 Last data filed at 05/29/2018 2248 Gross per 24 hour  Intake 483 ml  Output 1400 ml  Net -917 ml   Filed Weights   05/28/18 0420 05/29/18 0215 05/30/18 0539  Weight: 122.8 kg 121.7 kg 121.1 kg    Telemetry    Sinus rhythm - Personally Reviewed  Physical Exam   GEN: Resting in bed, breathing comfortably, in NAD Neck:  JVD difficult to appreciate, no carotid bruits Cardiac: regular S1 and S2, no murmurs, rubs, or gallops.  Respiratory: quiet breat sounds throughout, faint crackles at bilateral bases GI: NABS, Soft, obese, nontender, non-distended    MS: 1+ bilateral LE edema; R lateral toe/foot wound dresses Neuro:  Nonfocal, moving all extremities spontaneously Psych: Normal affect   Labs    Chemistry Recent Labs  Lab 05/25/18 0618  05/28/18 0442 05/29/18 0550 05/30/18 0521  NA 138   < > 136 138 138  K 4.8   < > 4.6 4.5 4.6  CL 110   < > 105 107 107  CO2 21*   < > 20* 21* 20*  GLUCOSE 85   < > 163* 112* 90  BUN 34*   < > 38* 38* 39*  CREATININE 3.18*   < > 3.27* 3.88* 3.98*  CALCIUM 8.0*   < > 7.8* 7.9* 8.0*  PROT 6.5  --   --   --   --   ALBUMIN 2.2*  --   --   --   --   AST 20  --   --   --   --   ALT 18  --   --   --   --   ALKPHOS 136*  --   --   --   --   BILITOT 0.5  --   --   --   --   GFRNONAA 18*   < >  17* 14* 14*  GFRAA 21*   < > 20* 16* 16*  ANIONGAP 7   < > 11 10 11    < > = values in this interval not displayed.     Hematology Recent Labs  Lab 05/24/18 1928  WBC 5.7  RBC 3.17*  HGB 8.8*  HCT 28.9*  MCV 91.2  MCH 27.8  MCHC 30.4  RDW 14.8  PLT 326    Cardiac Enzymes Recent Labs  Lab 05/24/18 1928 05/25/18 0009 05/25/18 0618  TROPONINI <0.03 <0.03 <0.03   No results for input(s): TROPIPOC in the last 168 hours.   BNPNo results for input(s): BNP, PROBNP in the last 168 hours.   DDimer No results for input(s): DDIMER in the last 168 hours.   Radiology    No results found.  Cardiac Studies   Echocardiogram 05/25/18: Study Conclusions  - Left ventricle: The cavity size was mildly dilated. There was mild concentric hypertrophy. Systolic function was mildly reduced. The estimated ejection fraction was in the range of 45% to 50%. Diffuse hypokinesis. Features are consistent with a pseudonormal left ventricular filling pattern, with concomitant abnormal relaxation and increased filling pressure (grade 2 diastolic dysfunction). - Left atrium: The atrium was mildly dilated.  Patient Profile     37 y.o.femalewith a hx of DM, HTN,RA,morbid obesity, GERD,  lymphedema,CKD-3who is being followed by cardiology for the evaluation of heart failure.  Assessment & Plan    1. Acute systolic CHF: patient presented with SOB. CXR with pulmonary edema. BNP 3500. Echo with EF 45-50%, G2DD, and diffuse hypokinesis.  -she has some fluid remaining, but reports significant improvement.  -net negative 6.2 L, weight from 125.1 kg to 121.1 kg -now on torsemide PO, had elevations in creatinine with diuresis. Will need BMET followed as outpatient.  -on carvedilol 12.5 mg BID -cannot do ACEi/ARB/ARNI/MRA due to acute on chronic renal insufficiency - continue to monitor strict I&Os and daily weights. - Limit fluid intake to 1.5L, low sodium diet - on hydralazine, consider adding isordil as outpatient if she cannot be on ACEi/ARB long term  2. HTN: BP improving. - Continue hydralazine and carvedilol, medication titration as above  3. DM type 2: Hgb A1C 7.2. On insulin - Continue management per primary team  4. Acute on chronic renal insufficiency stage 3: Cr up to 3.98 today (baseline ~2). Nephrology following for nephrotic syndrome 2/2 diabetic glomerulosclerosis. Nephro anticipates need for dialysis in the near future but likely not this admission.  - Continue to monitor Cr closely with IV diuresis.   [facility] will sign off in anticipation of discharge.   Medication Recommendations:  Carvedilol, hydralazine, torsemide as above. Would hold home metolazone until her creatinine stabilizes as an outpatient Other recommendations (labs, testing, etc):  BMET within a week Follow up as an outpatient:  Dr. Debara Pickett, will need nephrology assistance with diuretic management  TIME SPENT WITH PATIENT: 25 minutes of direct patient care. More than 50% of that time was spent on coordination of care and counseling regarding heart failure education, medication review.  Buford Dresser, MD, PhD [facility]   For questions or updates, please contact  Mark Please consult [email or web address] for contact info under Cardiology/STEMI.     Signed, Buford Dresser, MD  05/30/2018, 9:29 AM   [phone]

## 2018-05-30 NOTE — Discharge Summary (Signed)
Physician Discharge Summary  Carla Compton [id number]:[id number] DOB: 10-Mar-1981 DOA: 05/24/2018  PCP: No primary care provider on file.  Admit date: 05/24/2018 Discharge date: 05/30/2018  Admitted From: Home Disposition:  Home  Recommendations for Outpatient Follow-up:  1. Follow up with PCP in 1-2 weeks 2. Follow up with Nephrology as scheduled 3. Recommend repeat bmet in one week 4. Recommend repeat TSH in 4 weeks  Discharge Condition:Improved CODE STATUS:Full Diet recommendation:  Diabetic, heart healthy  Brief/Interim Summary: 37 y.o.femalewith a history of IDDM, morbid obesity, HTN, and stage III CKD who presented to [facility] ED with progressive swelling of the body, mostly legs for weeks, and dyspnea worsening over a couple days. Per records, creatinine was above baseline (?2-3) at 3.3, and CXR was consistent with CHF, felt to be a new diagnosis. BNP 3518, troponin 0.29, hgb 8.8. 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 Note BUN:Cr remains only about 10:1. Note also some element of edema will be due to hypoalbuminuria. - Started beta blocker, not ACE/ARB/ARNI with renal failure -Good urine output documented, transitioned to PO torsemide 12/7  AKI on stage III FGH:WEXHB diabetic nephrosclerosis followed by nephrology who has discussed eventual HD, though no indications currently. - Nephrology consulted, appreciate their recommendations. - Monitor BMP with diuresis -  Avoid nephrotoxins as able. - Needs aggressive control of HTN, DM, obesity - Continue calcitriol as tolerated - Repeat bmet in AM. Cr remains stable at this time  IDDM: Dx in 20's, initially on po meds, now on basal-bolus insulin.Fair control with HbA1c 7.2%. - Continue bolus insulin with sliding scale qAC/HS. At inpatient goal. - Continue on home statin  HTN:  - Resumed home hydralazine since BPs up, defer to cardiology for further management. - Continue coreg - Continued with Lasix as above  Morbid obesity: BMI 50. - Dietitian consulted, education provided.  Anemia of chronic disease: No active bleeding noted.  Elevated troponin: Reported at [facility]. Cycled and undetectable here with no chest pain. Nonischemic ECG noted - Cardiology had been following   Elevated TSH: Mild, 5.567with slightly low T4. - T3 pending -Could consider low dose synthroid, though this could also be euthyroid sick syndrome. -Recommend repeat TSH in 4 weeks from last check. If still c/w hypothyroid, would begin tx at that time  Alkaline phosphatase elevation: Modest, suspect FLD with DM, obesity.  - seems stable at this time  L 5th digit ulcer -Xray ordered and reviewed, no osseous findings -Consulted WOC, input appreciated -Patient reports one month hx of recurring ulcer -Would treat with augmentin x 7-10 days. If no improvement, consider imaging at that time. Patient has outpatient wound clinic visit scheduled for later this week   Discharge Instructions   Allergies as of 05/30/2018      Reactions   Contrast Media [iodinated Diagnostic Agents]       Medication List    STOP taking these medications   metolazone 2.5 MG tablet Commonly known as:  ZAROXOLYN     TAKE these medications  acetaminophen 500 MG tablet Commonly known as:  TYLENOL Take 1,000 mg by mouth as needed for mild pain or headache.   amoxicillin-clavulanate 875-125 MG tablet Commonly known as:  AUGMENTIN Take 1  tablet by mouth every 12 (twelve) hours for 8 days.   calcitRIOL 0.25 MCG capsule Commonly known as:  ROCALTROL Take 1 capsule (0.25 mcg total) by mouth 3 (three) times a week. Start taking on:  06/01/2018 What changed:  additional instructions   carvedilol 12.5 MG tablet Commonly known as:  COREG Take 1 tablet (12.5 mg total) by mouth 2 (two) times daily with a meal.   hydrALAZINE 50 MG tablet Commonly known as:  APRESOLINE Take 1 tablet (50 mg total) by mouth 3 (three) times daily. What changed:    medication strength  how much to take   PROAIR HFA 108 (90 Base) MCG/ACT inhaler Generic drug:  albuterol Inhale 2 puffs into the lungs every 4 (four) hours as needed for wheezing or shortness of breath.   rosuvastatin 40 MG tablet Commonly known as:  CRESTOR Take 40 mg by mouth at bedtime.   torsemide 20 MG tablet Commonly known as:  DEMADEX Take 2 tablets (40 mg total) by mouth daily. Start taking on:  05/31/2018      Follow-up Information    Hilty, Nadean Corwin, MD. Schedule an appointment as soon as possible for a visit in 1 week(s).   Specialty:  Cardiology Contact information: [place] [address] [phone]        Follow up with your Nephrologist as scheduled. Schedule an appointment as soon as possible for a visit.          Allergies  Allergen Reactions  . Contrast Media [Iodinated Diagnostic Agents]     Consultations:  Cardiology  Nephrology  Procedures/Studies: Dg Chest 2 View  Result Date: 05/25/2018 CLINICAL DATA:  Congestive heart failure. EXAM: CHEST - 2 VIEW COMPARISON:  None. FINDINGS: Heart size is normal. There is pulmonary venous hypertension with mild interstitial edema, fluid in the fissures and a small amount of fluid in the posterior costophrenic angles. No consolidation or collapse. No significant bone finding. IMPRESSION: Congestive heart failure pattern with pulmonary venous hypertension, interstitial  edema and a small pleural fluid. Electronically Signed   By: Nelson Chimes M.D.   On: 05/25/2018 07:05   Dg Foot Complete Left  Result Date: 05/27/2018 CLINICAL DATA:  Possible fifth digit fracture with pain. EXAM: LEFT FOOT - COMPLETE 3+ VIEW COMPARISON:  None. FINDINGS: Irregularity of the fifth digit skin which may be related to history of bandage. There is no soft tissue emphysema, opaque foreign body, erosion, or fracture. Dorsal soft tissue swelling of the forefoot. Osteopenia IMPRESSION: No acute osseous finding. Electronically Signed   By: Monte Fantasia M.D.   On: 05/27/2018 14:39    Subjective: Eager to go home today  Discharge Exam: Vitals:   05/30/18 0539 05/30/18 1231  BP: 139/75 (!) 149/90  Pulse: 83 87  Resp: 14 18  Temp: 98 F (36.7 C) 98.4 F (36.9 C)  SpO2: 98% 98%   Vitals:   05/29/18 1327 05/29/18 2017 05/30/18 0539 05/30/18 1231  BP: (!) 158/99 133/77 139/75 (!) 149/90  Pulse: 81 86 83 87  Resp: 18 19 14 18   Temp: 97.6 F (36.4 C) 97.8 F (36.6 C) 98 F (36.7 C) 98.4 F (36.9 C)  TempSrc: Oral Oral Oral Oral  SpO2: 97% 98% 98% 98%  Weight:   121.1 kg  Height:        General: Pt is alert, awake, not in acute distress Cardiovascular: RRR, S1/S2 +, no rubs, no gallops Respiratory: CTA bilaterally, no wheezing, no rhonchi Abdominal: Soft, NT, ND, bowel sounds + Extremities: no edema, no cyanosis   The results of significant diagnostics from this hospitalization (including imaging, microbiology, ancillary and laboratory) are listed below for reference.     Microbiology: No results found for this or any previous visit (from the past 240 hour(s)).   Labs: BNP (last 3 results) No results for input(s): BNP in the last 8760 hours. Basic Metabolic Panel: Recent Labs  Lab 05/26/18 0457 05/27/18 0349 05/28/18 0442 05/29/18 0550 05/30/18 0521  NA 136 138 136 138 138  K 4.8 4.6 4.6 4.5 4.6  CL 107 107 105 107 107  CO2 21* 20* 20* 21* 20*   GLUCOSE 99 136* 163* 112* 90  BUN 35* 36* 38* 38* 39*  CREATININE 3.28* 3.29* 3.27* 3.88* 3.98*  CALCIUM 8.0* 8.1* 7.8* 7.9* 8.0*   Liver Function Tests: Recent Labs  Lab 05/25/18 0618  AST 20  ALT 18  ALKPHOS 136*  BILITOT 0.5  PROT 6.5  ALBUMIN 2.2*   No results for input(s): LIPASE, AMYLASE in the last 168 hours. No results for input(s): AMMONIA in the last 168 hours. CBC: Recent Labs  Lab 05/24/18 1928  WBC 5.7  NEUTROABS 2.7  HGB 8.8*  HCT 28.9*  MCV 91.2  PLT 326   Cardiac Enzymes: Recent Labs  Lab 05/24/18 1928 05/25/18 0009 05/25/18 0618  TROPONINI <0.03 <0.03 <0.03   BNP: Invalid input(s): POCBNP CBG: Recent Labs  Lab 05/29/18 1123 05/29/18 1630 05/29/18 2104 05/30/18 0734 05/30/18 1152  GLUCAP 123* 124* 112* 76 92   D-Dimer No results for input(s): DDIMER in the last 72 hours. Hgb A1c No results for input(s): HGBA1C in the last 72 hours. Lipid Profile No results for input(s): CHOL, HDL, LDLCALC, TRIG, CHOLHDL, LDLDIRECT in the last 72 hours. Thyroid function studies No results for input(s): TSH, T4TOTAL, T3FREE, THYROIDAB in the last 72 hours.  Invalid input(s): FREET3 Anemia work up No results for input(s): VITAMINB12, FOLATE, FERRITIN, TIBC, IRON, RETICCTPCT in the last 72 hours. Urinalysis No results found for: COLORURINE, APPEARANCEUR, LABSPEC, [place], GLUCOSEU, HGBUR, BILIRUBINUR, KETONESUR, PROTEINUR, UROBILINOGEN, NITRITE, LEUKOCYTESUR Sepsis Labs Invalid input(s): PROCALCITONIN,  WBC,  LACTICIDVEN Microbiology No results found for this or any previous visit (from the past 240 hour(s)).  Time spent: 30 min  SIGNED:   Marylu Lund, MD  [facility] 05/30/2018, 12:32 PM  If 7PM-7AM, please contact night-coverage

## 2018-05-30 NOTE — Plan of Care (Signed)
  Problem: Education: Goal: Knowledge of General Education information will improve Description Including pain rating scale, medication(s)/side effects and non-pharmacologic comfort measures Outcome: Progressing   Problem: Clinical Measurements: Goal: Ability to maintain clinical measurements within normal limits will improve Outcome: Progressing   Problem: Clinical Measurements: Goal: Cardiovascular complication will be avoided Outcome: Progressing   Problem: Education: Goal: Individualized Educational Video(s) Outcome: Progressing   Problem: Activity: Goal: Capacity to carry out activities will improve Outcome: Progressing   Problem: Cardiac: Goal: Ability to achieve and maintain adequate cardiopulmonary perfusion will improve Outcome: Progressing

## 2018-05-30 NOTE — Progress Notes (Signed)
Discharge instructions reviewed with the patient using teach back. All questions answered. A discharge packet was given to her including her prescriptions. She is waiting on her sister to pick her up.

## 2018-06-05 ENCOUNTER — Inpatient Hospital Stay (HOSPITAL_COMMUNITY)
Admission: AD | Admit: 2018-06-05 | Discharge: 2018-06-14 | DRG: 291 | Disposition: A | Discharge status: Home Health | Payer: BLUE CROSS/BLUE SHIELD | Source: Other Acute Inpatient Hospital | Attending: Internal Medicine | Admitting: Internal Medicine

## 2018-06-05 DIAGNOSIS — N049 Nephrotic syndrome with unspecified morphologic changes: Secondary | ICD-10-CM | POA: Diagnosis present

## 2018-06-05 DIAGNOSIS — N184 Chronic kidney disease, stage 4 (severe): Secondary | ICD-10-CM | POA: Diagnosis present

## 2018-06-05 DIAGNOSIS — I130 Hypertensive heart and chronic kidney disease with heart failure and stage 1 through stage 4 chronic kidney disease, or unspecified chronic kidney disease: Principal | ICD-10-CM | POA: Diagnosis present

## 2018-06-05 DIAGNOSIS — I5043 Acute on chronic combined systolic (congestive) and diastolic (congestive) heart failure: Secondary | ICD-10-CM | POA: Diagnosis present

## 2018-06-05 DIAGNOSIS — E1365 Other specified diabetes mellitus with hyperglycemia: Secondary | ICD-10-CM | POA: Diagnosis not present

## 2018-06-05 DIAGNOSIS — R609 Edema, unspecified: Secondary | ICD-10-CM

## 2018-06-05 DIAGNOSIS — Z833 Family history of diabetes mellitus: Secondary | ICD-10-CM

## 2018-06-05 DIAGNOSIS — L97529 Non-pressure chronic ulcer of other part of left foot with unspecified severity: Secondary | ICD-10-CM | POA: Diagnosis present

## 2018-06-05 DIAGNOSIS — E11621 Type 2 diabetes mellitus with foot ulcer: Secondary | ICD-10-CM | POA: Diagnosis present

## 2018-06-05 DIAGNOSIS — Z6841 Body Mass Index (BMI) 40.0 and over, adult: Secondary | ICD-10-CM

## 2018-06-05 DIAGNOSIS — IMO0002 Reserved for concepts with insufficient information to code with codable children: Secondary | ICD-10-CM | POA: Diagnosis present

## 2018-06-05 DIAGNOSIS — N269 Renal sclerosis, unspecified: Secondary | ICD-10-CM | POA: Diagnosis present

## 2018-06-05 DIAGNOSIS — E785 Hyperlipidemia, unspecified: Secondary | ICD-10-CM | POA: Diagnosis present

## 2018-06-05 DIAGNOSIS — D638 Anemia in other chronic diseases classified elsewhere: Secondary | ICD-10-CM | POA: Diagnosis present

## 2018-06-05 DIAGNOSIS — M069 Rheumatoid arthritis, unspecified: Secondary | ICD-10-CM | POA: Diagnosis present

## 2018-06-05 DIAGNOSIS — E1165 Type 2 diabetes mellitus with hyperglycemia: Secondary | ICD-10-CM | POA: Diagnosis present

## 2018-06-05 DIAGNOSIS — E8770 Fluid overload, unspecified: Secondary | ICD-10-CM | POA: Diagnosis present

## 2018-06-05 DIAGNOSIS — E43 Unspecified severe protein-calorie malnutrition: Secondary | ICD-10-CM | POA: Diagnosis present

## 2018-06-05 DIAGNOSIS — Z79899 Other long term (current) drug therapy: Secondary | ICD-10-CM

## 2018-06-05 DIAGNOSIS — M869 Osteomyelitis, unspecified: Secondary | ICD-10-CM | POA: Diagnosis present

## 2018-06-05 DIAGNOSIS — L0390 Cellulitis, unspecified: Secondary | ICD-10-CM | POA: Diagnosis not present

## 2018-06-05 DIAGNOSIS — E1122 Type 2 diabetes mellitus with diabetic chronic kidney disease: Secondary | ICD-10-CM | POA: Diagnosis present

## 2018-06-05 DIAGNOSIS — R52 Pain, unspecified: Secondary | ICD-10-CM

## 2018-06-05 DIAGNOSIS — E1322 Other specified diabetes mellitus with diabetic chronic kidney disease: Secondary | ICD-10-CM | POA: Diagnosis present

## 2018-06-05 DIAGNOSIS — E1142 Type 2 diabetes mellitus with diabetic polyneuropathy: Secondary | ICD-10-CM

## 2018-06-05 DIAGNOSIS — Z8249 Family history of ischemic heart disease and other diseases of the circulatory system: Secondary | ICD-10-CM

## 2018-06-05 DIAGNOSIS — E1169 Type 2 diabetes mellitus with other specified complication: Secondary | ICD-10-CM | POA: Diagnosis present

## 2018-06-05 DIAGNOSIS — E875 Hyperkalemia: Secondary | ICD-10-CM | POA: Diagnosis present

## 2018-06-05 DIAGNOSIS — N179 Acute kidney failure, unspecified: Secondary | ICD-10-CM

## 2018-06-05 DIAGNOSIS — K219 Gastro-esophageal reflux disease without esophagitis: Secondary | ICD-10-CM | POA: Diagnosis present

## 2018-06-05 DIAGNOSIS — E872 Acidosis: Secondary | ICD-10-CM | POA: Diagnosis present

## 2018-06-05 MED ORDER — SODIUM CHLORIDE 0.9% FLUSH
3.0000 mL | Freq: Two times a day (BID) | INTRAVENOUS | Status: DC
  Administered 2018-06-06 – 2018-06-14 (×17): 3 mL via INTRAVENOUS

## 2018-06-05 MED ORDER — SODIUM CHLORIDE 0.9 % IV SOLN
250.0000 mL | INTRAVENOUS | Status: DC | PRN
  Administered 2018-06-07: 250 mL via INTRAVENOUS

## 2018-06-05 MED ORDER — SODIUM CHLORIDE 0.9% FLUSH
3.0000 mL | INTRAVENOUS | Status: DC | PRN
  Administered 2018-06-10: 3 mL via INTRAVENOUS
  Filled 2018-06-05: qty 3

## 2018-06-05 MED ORDER — HEPARIN SODIUM (PORCINE) 5000 UNIT/ML IJ SOLN
5000.0000 [IU] | Freq: Three times a day (TID) | INTRAMUSCULAR | Status: DC
  Administered 2018-06-05 – 2018-06-14 (×20): 5000 [IU] via SUBCUTANEOUS
  Filled 2018-06-05 (×24): qty 1

## 2018-06-05 MED ORDER — ACETAMINOPHEN 325 MG PO TABS
650.0000 mg | ORAL_TABLET | ORAL | Status: DC | PRN
  Administered 2018-06-06 – 2018-06-13 (×5): 650 mg via ORAL
  Filled 2018-06-05 (×5): qty 2

## 2018-06-05 MED ORDER — ONDANSETRON HCL 4 MG/2ML IJ SOLN: 4.0000 mg | Freq: Four times a day (QID) | INTRAMUSCULAR | Status: DC | PRN

## 2018-06-06 ENCOUNTER — Other Ambulatory Visit: Payer: Self-pay

## 2018-06-06 ENCOUNTER — Encounter (HOSPITAL_COMMUNITY): Payer: Self-pay | Admitting: *Deleted

## 2018-06-06 ENCOUNTER — Inpatient Hospital Stay (HOSPITAL_COMMUNITY): Payer: BLUE CROSS/BLUE SHIELD

## 2018-06-06 DIAGNOSIS — N184 Chronic kidney disease, stage 4 (severe): Secondary | ICD-10-CM

## 2018-06-06 DIAGNOSIS — E1322 Other specified diabetes mellitus with diabetic chronic kidney disease: Secondary | ICD-10-CM

## 2018-06-06 DIAGNOSIS — E1365 Other specified diabetes mellitus with hyperglycemia: Secondary | ICD-10-CM

## 2018-06-06 LAB — BASIC METABOLIC PANEL
ANION GAP: 7 (ref 5–15)
BUN: 36 mg/dL — ABNORMAL HIGH (ref 6–20)
CO2: 21 mmol/L — ABNORMAL LOW (ref 22–32)
Calcium: 8.2 mg/dL — ABNORMAL LOW (ref 8.9–10.3)
Chloride: 111 mmol/L (ref 98–111)
Creatinine, Ser: 3.69 mg/dL — ABNORMAL HIGH (ref 0.44–1.00)
GFR calc Af Amer: 17 mL/min — ABNORMAL LOW (ref >60)
GFR calc non Af Amer: 15 mL/min — ABNORMAL LOW (ref >60)
Glucose, Bld: 92 mg/dL (ref 70–99)
Potassium: 4.9 mmol/L (ref 3.5–5.1)
Sodium: 139 mmol/L (ref 135–145)

## 2018-06-06 LAB — CBC WITH DIFFERENTIAL/PLATELET
Abs Immature Granulocytes: 0.02 10*3/uL (ref 0.00–0.07)
Basophils Absolute: 0 10*3/uL (ref 0.0–0.1)
Basophils Relative: 0 %
Eosinophils Absolute: 0.4 10*3/uL (ref 0.0–0.5)
Eosinophils Relative: 6 %
HCT: 27.2 % — ABNORMAL LOW (ref 36.0–46.0)
Hemoglobin: 8.4 g/dL — ABNORMAL LOW (ref 12.0–15.0)
Immature Granulocytes: 0 %
Lymphocytes Relative: 27 %
Lymphs Abs: 1.5 10*3/uL (ref 0.7–4.0)
MCH: 28.1 pg (ref 26.0–34.0)
MCHC: 30.9 g/dL (ref 30.0–36.0)
MCV: 91 fL (ref 80.0–100.0)
Monocytes Absolute: 0.4 10*3/uL (ref 0.1–1.0)
Monocytes Relative: 7 %
NEUTROS PCT: 60 %
Neutro Abs: 3.4 10*3/uL (ref 1.7–7.7)
Platelets: 308 10*3/uL (ref 150–400)
RBC: 2.99 MIL/uL — AB (ref 3.87–5.11)
RDW: 14.3 % (ref 11.5–15.5)
WBC: 5.7 10*3/uL (ref 4.0–10.5)
nRBC: 0 % (ref 0.0–0.2)

## 2018-06-06 LAB — GLUCOSE, CAPILLARY
GLUCOSE-CAPILLARY: 84 mg/dL (ref 70–99)
Glucose-Capillary: 73 mg/dL (ref 70–99)
Glucose-Capillary: 89 mg/dL (ref 70–99)
Glucose-Capillary: 85 mg/dL (ref 70–99)
Glucose-Capillary: 79 mg/dL (ref 70–99)

## 2018-06-06 LAB — COMPREHENSIVE METABOLIC PANEL
ALT: 11 U/L (ref 0–44)
AST: 14 U/L — ABNORMAL LOW (ref 15–41)
Albumin: 2.1 g/dL — ABNORMAL LOW (ref 3.5–5.0)
Alkaline Phosphatase: 72 U/L (ref 38–126)
Anion gap: 10 (ref 5–15)
BUN: 36 mg/dL — ABNORMAL HIGH (ref 6–20)
CO2: 17 mmol/L — ABNORMAL LOW (ref 22–32)
Calcium: 8.2 mg/dL — ABNORMAL LOW (ref 8.9–10.3)
Chloride: 111 mmol/L (ref 98–111)
Creatinine, Ser: 3.77 mg/dL — ABNORMAL HIGH (ref 0.44–1.00)
GFR calc Af Amer: 17 mL/min — ABNORMAL LOW (ref >60)
GFR calc non Af Amer: 15 mL/min — ABNORMAL LOW (ref >60)
Glucose, Bld: 98 mg/dL (ref 70–99)
Potassium: 5 mmol/L (ref 3.5–5.1)
SODIUM: 138 mmol/L (ref 135–145)
Total Bilirubin: 0.6 mg/dL (ref 0.3–1.2)
Total Protein: 6.2 g/dL — ABNORMAL LOW (ref 6.5–8.1)

## 2018-06-06 LAB — BRAIN NATRIURETIC PEPTIDE: B Natriuretic Peptide: 369.8 pg/mL — ABNORMAL HIGH (ref 0.0–100.0)

## 2018-06-06 MED ORDER — OXYCODONE HCL 5 MG PO TABS
5.0000 mg | ORAL_TABLET | Freq: Three times a day (TID) | ORAL | Status: DC | PRN
  Administered 2018-06-06 – 2018-06-08 (×2): 5 mg via ORAL
  Filled 2018-06-06 (×2): qty 1

## 2018-06-06 MED ORDER — INSULIN ASPART 100 UNIT/ML ~~LOC~~ SOLN
0.0000 [IU] | Freq: Three times a day (TID) | SUBCUTANEOUS | Status: DC
  Administered 2018-06-08: 2 [IU] via SUBCUTANEOUS
  Administered 2018-06-09 – 2018-06-12 (×2): 1 [IU] via SUBCUTANEOUS

## 2018-06-06 MED ORDER — CALCITRIOL 0.25 MCG PO CAPS
0.2500 ug | ORAL_CAPSULE | ORAL | Status: DC
  Administered 2018-06-08 – 2018-06-12 (×3): 0.25 ug via ORAL
  Filled 2018-06-06 (×3): qty 1

## 2018-06-06 MED ORDER — ALBUTEROL SULFATE (2.5 MG/3ML) 0.083% IN NEBU: 3.0000 mL | INHALATION_SOLUTION | RESPIRATORY_TRACT | Status: DC | PRN

## 2018-06-06 MED ORDER — HYDRALAZINE HCL 50 MG PO TABS
50.0000 mg | ORAL_TABLET | Freq: Three times a day (TID) | ORAL | Status: DC
  Administered 2018-06-06 – 2018-06-08 (×8): 50 mg via ORAL
  Filled 2018-06-06 (×8): qty 1

## 2018-06-06 MED ORDER — AMOXICILLIN-POT CLAVULANATE 875-125 MG PO TABS
1.0000 | ORAL_TABLET | Freq: Two times a day (BID) | ORAL | Status: DC
  Administered 2018-06-06: 1 via ORAL
  Filled 2018-06-06: qty 1

## 2018-06-06 MED ORDER — INSULIN ASPART 100 UNIT/ML ~~LOC~~ SOLN: 0.0000 [IU] | Freq: Every day | SUBCUTANEOUS | Status: DC

## 2018-06-06 MED ORDER — TORSEMIDE 20 MG PO TABS
20.0000 mg | ORAL_TABLET | Freq: Every day | ORAL | Status: DC
  Administered 2018-06-06 – 2018-06-07 (×2): 20 mg via ORAL
  Filled 2018-06-06 (×2): qty 1

## 2018-06-06 MED ORDER — ROSUVASTATIN CALCIUM 20 MG PO TABS
40.0000 mg | ORAL_TABLET | Freq: Every day | ORAL | Status: DC
  Administered 2018-06-06 – 2018-06-13 (×8): 40 mg via ORAL
  Filled 2018-06-06 (×8): qty 2

## 2018-06-06 MED ORDER — AMOXICILLIN-POT CLAVULANATE 500-125 MG PO TABS
1.0000 | ORAL_TABLET | Freq: Two times a day (BID) | ORAL | Status: DC
  Administered 2018-06-06 – 2018-06-07 (×2): 500 mg via ORAL
  Filled 2018-06-06 (×3): qty 1

## 2018-06-06 MED ORDER — CARVEDILOL 12.5 MG PO TABS
12.5000 mg | ORAL_TABLET | Freq: Two times a day (BID) | ORAL | Status: DC
  Administered 2018-06-06 – 2018-06-10 (×10): 12.5 mg via ORAL
  Filled 2018-06-06: qty 1
  Filled 2018-06-06: qty 2
  Filled 2018-06-06 (×8): qty 1

## 2018-06-06 MED ORDER — SENNOSIDES-DOCUSATE SODIUM 8.6-50 MG PO TABS
1.0000 | ORAL_TABLET | Freq: Two times a day (BID) | ORAL | Status: DC
  Administered 2018-06-06 – 2018-06-12 (×6): 1 via ORAL
  Filled 2018-06-06 (×16): qty 1

## 2018-06-06 NOTE — Progress Notes (Signed)
Patient complaining of 8 out of 10 Right sided ear pain- radiating into the jaw.  Pt stated started last night ( while here).  Gave tylenol and Paged MD.

## 2018-06-06 NOTE — H&P (Signed)
History and Physical    Carla Compton JJK:[id number] DOB: [birth year] DOA: 06/05/2018  PCP: No primary care provider on file.  Patient coming from: OSH transfer  I have personally briefly reviewed patient's old medical records in [place]  Chief Complaint: AKI, fluid overload  HPI: Carla Compton is a 37 y.o. female with medical history significant of DM2, CKD stage 4-5, HTN.  Patient was recently admitted to our service from Dec 1-7 with CHF exacerbation.  2d echo showed EF 37-16%, grade 2 diastolic dysfunction.  Of concern creat was ranging between 3-3.98 during that admission.  Patient was diuresed and discharged on torsemide 40mg  daily.  Patient came to ED at OSH for indigestion symptoms but was admitted for creat 4.34 (BNP 340 on admit).  Torsemide held, gently hydrated with NS at 50 cc/hr her creat has trended down to 3.91 with BUN 40.  But she then started to look slightly fluid overloaded so IVF stopped.  She has been transferred to [facility] for further care.  Currently patient feels fine, denies respiratory distress, feels "a little" fluid overloaded but not very much.   Review of Systems: As per HPI otherwise 10 point review of systems negative.   Past Medical History:  Diagnosis Date  . Benign essential HTN   . CKD (chronic kidney disease) stage 3, GFR 30-59 ml/min (HCC)   . Diabetes ([place])     No past surgical history on file.   reports that she has never smoked. She has never used smokeless tobacco. She reports that she does not drink alcohol or use drugs.  Allergies  Allergen Reactions  . Contrast Media [Iodinated Diagnostic Agents]     Family History  Problem Relation Age of Onset  . Mitral valve prolapse Mother   . Hypertension Mother   . Hypertension Father   . Diabetes Father   . Diabetes Maternal Grandmother      Prior to Admission medications   Medication Sig Start Date End Date Taking? Authorizing Provider  acetaminophen (TYLENOL) 500 MG  tablet Take 1,000 mg by mouth as needed for mild pain or headache.    [provider]  albuterol (PROAIR HFA) 108 (90 Base) MCG/ACT inhaler Inhale 2 puffs into the lungs every 4 (four) hours as needed for wheezing or shortness of breath.    [provider]  amoxicillin-clavulanate (AUGMENTIN) 875-125 MG tablet Take 1 tablet by mouth every 12 (twelve) hours for 8 days. 05/30/18 06/07/18  Donne Hazel, MD  calcitRIOL (ROCALTROL) 0.25 MCG capsule Take 1 capsule (0.25 mcg total) by mouth 3 (three) times a week. 06/01/18 07/01/18  Donne Hazel, MD  carvedilol (COREG) 12.5 MG tablet Take 1 tablet (12.5 mg total) by mouth 2 (two) times daily with a meal. 05/30/18 06/29/18  Donne Hazel, MD  hydrALAZINE (APRESOLINE) 50 MG tablet Take 1 tablet (50 mg total) by mouth 3 (three) times daily. 05/30/18 06/29/18  Donne Hazel, MD  rosuvastatin (CRESTOR) 40 MG tablet Take 40 mg by mouth at bedtime. 03/23/18   [provider]  torsemide (DEMADEX) 20 MG tablet Take 2 tablets (40 mg total) by mouth daily. 05/31/18 06/30/18  Donne Hazel, MD    Physical Exam: Vitals:   06/05/18 2150  BP: 131/77  Pulse: 87  Resp: 18  Temp: 98.4 F (36.9 C)  TempSrc: Oral  SpO2: 98%    Constitutional: NAD, calm, comfortable Eyes: PERRL, lids and conjunctivae normal ENMT: Mucous membranes are moist. Posterior pharynx clear of any  exudate or lesions.Normal dentition.  Neck: normal, supple, no masses, no thyromegaly Respiratory: clear to auscultation bilaterally, no wheezing, no crackles. Normal respiratory effort. No accessory muscle use.  Cardiovascular: Regular rate and rhythm, no murmurs / rubs / gallops. No extremity edema. 2+ pedal pulses. No carotid bruits.  Abdomen: no tenderness, no masses palpated. No hepatosplenomegaly. Bowel sounds positive.  Musculoskeletal: no clubbing / cyanosis. No joint deformity upper and lower extremities. Good ROM, no contractures. Normal muscle tone.  Skin: no  rashes, lesions, ulcers. No induration Neurologic: CN 2-12 grossly intact. Sensation intact, DTR normal. Strength 5/5 in all 4.  Psychiatric: Normal judgment and insight. Alert and oriented x 3. Normal mood.    Labs on Admission: I have personally reviewed following labs and imaging studies  CBC: Recent Labs  Lab 06/05/18 2335  WBC 5.7  NEUTROABS 3.4  HGB 8.4*  HCT 27.2*  MCV 91.0  PLT 294   Basic Metabolic Panel: Recent Labs  Lab 05/30/18 0521 06/05/18 2335  NA 138 138  K 4.6 5.0  CL 107 111  CO2 20* 17*  GLUCOSE 90 98  BUN 39* 36*  CREATININE 3.98* 3.77*  CALCIUM 8.0* 8.2*   GFR: Estimated Creatinine Clearance: 25.8 mL/min (A) (by C-G formula based on SCr of 3.77 mg/dL (H)). Liver Function Tests: Recent Labs  Lab 06/05/18 2335  AST 14*  ALT 11  ALKPHOS 72  BILITOT 0.6  PROT 6.2*  ALBUMIN 2.1*   No results for input(s): LIPASE, AMYLASE in the last 168 hours. No results for input(s): AMMONIA in the last 168 hours. Coagulation Profile: No results for input(s): INR, PROTIME in the last 168 hours. Cardiac Enzymes: No results for input(s): CKTOTAL, CKMB, CKMBINDEX, TROPONINI in the last 168 hours. BNP (last 3 results) No results for input(s): PROBNP in the last 8760 hours. HbA1C: No results for input(s): HGBA1C in the last 72 hours. CBG: Recent Labs  Lab 05/30/18 0734 05/30/18 1152  GLUCAP 76 92   Lipid Profile: No results for input(s): CHOL, HDL, LDLCALC, TRIG, CHOLHDL, LDLDIRECT in the last 72 hours. Thyroid Function Tests: No results for input(s): TSH, T4TOTAL, FREET4, T3FREE, THYROIDAB in the last 72 hours. Anemia Panel: No results for input(s): VITAMINB12, FOLATE, FERRITIN, TIBC, IRON, RETICCTPCT in the last 72 hours. Urine analysis: No results found for: COLORURINE, APPEARANCEUR, LABSPEC, PHURINE, GLUCOSEU, HGBUR, BILIRUBINUR, KETONESUR, PROTEINUR, UROBILINOGEN, NITRITE, LEUKOCYTESUR  Radiological Exams on Admission: No results found.  EKG:  Independently reviewed.  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 Acute on chronic CHF - 1. CXR pending, BNP noted to be 369.8 (was 340 on admit to OSH) 2. Looks like OSH wanted to try torsemide 20mg  daily on discharge.  Seems reasonable to try, will go ahead and order this. 3. BMP daily 4. Strict intake and output 2. CKD stage 4-5 - 1. Nephrology consult in AM, think she is pretty close to needing dialysis at this point. 2. Continue non-diuretic meds 3. DM2 - 1. Resume sensitive SSI AC/HS which was apparently having good control for patient during last admit (according to DC summary). 4. L 5th toe ulcer - continue   DVT prophylaxis: Heparin [place] Code Status: Full Family Communication: No family in room Disposition Plan: Home after admit Consults called: None, call  nephrology in AM Admission status: Admit to inpatient - transferred from OSH  Severity of Illness: The appropriate patient status for this patient is INPATIENT. Inpatient status is judged to be reasonable and necessary in order to provide the required intensity of service to ensure the patient's safety. The patient's presenting symptoms, physical exam findings, and initial radiographic and laboratory data in the context of their chronic comorbidities is felt to place them at high risk for further clinical deterioration. Furthermore, it is not anticipated that the patient will be medically stable for discharge from the hospital within 2 midnights of admission. The following factors support the patient status of inpatient.   " The patient's presenting symptoms include Fluid overload in setting of advanced CKD. " The chronic co-morbidities include DM2, CKD stage 4-5.   * I certify  that at the point of admission it is my clinical judgment that the patient will require inpatient hospital care spanning beyond 2 midnights from the point of admission due to high intensity of service, high risk for further deterioration and high frequency of surveillance required.Etta Quill DO [facility] Pager [phone] Only works nights!  If 7AM-7PM, please contact the primary day team physician taking care of patient  [email or web address] Password [email or web address]  06/06/2018, 2:43 AM

## 2018-06-06 NOTE — Progress Notes (Signed)
PHARMACY NOTE:  ANTIMICROBIAL RENAL DOSAGE ADJUSTMENT  Current antimicrobial regimen includes a mismatch between antimicrobial dosage and estimated renal function.  As per policy approved by the Pharmacy & Therapeutics and Medical Executive Committees, the antimicrobial dosage will be adjusted accordingly.  Current antimicrobial dosage:  Augmentin 875-125 mg PO q12h  Indication: L 5th digit ulcer  Renal Function:   Estimated Creatinine Clearance: 26.4 mL/min (A) (by C-G formula based on SCr of 3.69 mg/dL (H)).    Antimicrobial dosage has been changed to:  Augmentin 500-125 mg PO q12h   Thank you for allowing pharmacy to be a part of this patient's care.  Jackson Latino, PharmD PGY1 Pharmacy Resident Phone [phone] 06/06/2018     2:55 PM

## 2018-06-06 NOTE — Progress Notes (Signed)
Carla Compton is a 37 y.o. female with medical history significant of DM2, CKD stage 4-5, HTN.  Patient was recently admitted to our service from Dec 1-7 with CHF exacerbation.  2d echo showed EF 50-09%, grade 2 diastolic dysfunction.  Of concern creat was ranging between 3-3.98 during that admission.  Patient was diuresed and discharged on torsemide 40mg  daily.  Patient came to ED at OSH for indigestion symptoms but was admitted for creat 4.34 (BNP 340 on admit).  06/06/18: Patient seen and examined her bedside.  Reports pleuritic pain worse when she takes a deep breath.  No palpitations.  Currently being diuresed.  Please refer to H&P dictated by Dr. Alcario Compton on 06/06/2018 for further details of the assessment and plan.

## 2018-06-06 NOTE — Care Management Note (Signed)
Case Management Note  Patient Details  Name: Carla Compton MRN: [id number] Date of Birth: 05/01/1981  Subjective/Objective:    CHF               Action/Plan: 06/06/2018 - Patient is known to me from previous admission; CM will continue to follow for progression of care; B Pennie Rushing  05/25/2018 - Action/Plan: Patient is independent of all of her ADL's; works full time at [employer]; PCP - [facility] in [place], [place]; has private insurance with [employer]; pharmacy of choice is [facility] on [address] in [place], [place]; pt is agreeable to have the Dietitian talk to her about preparing meals; she has scales at home; CM talked to her about the importance of weighing herself daily. CM will continue to follow for progression of care. Mindi Slicker [facility]  Expected Discharge Date:    possibly 06/10/2018              Expected Discharge Plan:  Home/Self Care  Discharge planning Services  CM Consult     Status of Service:  In process, will continue to follow  Sherrilyn Rist [phone] 06/06/2018, 8:45 AM

## 2018-06-06 NOTE — Plan of Care (Signed)
  Problem: Education: Goal: Ability to demonstrate management of disease process will improve Outcome: Progressing   Problem: Education: Goal: Ability to verbalize understanding of medication therapies will improve Outcome: Progressing   

## 2018-06-07 ENCOUNTER — Inpatient Hospital Stay (HOSPITAL_COMMUNITY): Payer: BLUE CROSS/BLUE SHIELD

## 2018-06-07 LAB — BASIC METABOLIC PANEL
Anion gap: 11 (ref 5–15)
BUN: 35 mg/dL — ABNORMAL HIGH (ref 6–20)
CO2: 18 mmol/L — ABNORMAL LOW (ref 22–32)
Calcium: 8.1 mg/dL — ABNORMAL LOW (ref 8.9–10.3)
Chloride: 109 mmol/L (ref 98–111)
Creatinine, Ser: 3.79 mg/dL — ABNORMAL HIGH (ref 0.44–1.00)
GFR calc Af Amer: 17 mL/min — ABNORMAL LOW (ref >60)
GFR, EST NON AFRICAN AMERICAN: 15 mL/min — AB (ref >60)
Glucose, Bld: 70 mg/dL (ref 70–99)
Potassium: 5.1 mmol/L (ref 3.5–5.1)
SODIUM: 138 mmol/L (ref 135–145)

## 2018-06-07 LAB — SEDIMENTATION RATE: Sed Rate: 135 mm/hr — ABNORMAL HIGH (ref 0–22)

## 2018-06-07 LAB — GLUCOSE, CAPILLARY
Glucose-Capillary: 64 mg/dL — ABNORMAL LOW (ref 70–99)
Glucose-Capillary: 66 mg/dL — ABNORMAL LOW (ref 70–99)
Glucose-Capillary: 83 mg/dL (ref 70–99)

## 2018-06-07 LAB — C-REACTIVE PROTEIN: CRP: 14.8 mg/dL — ABNORMAL HIGH (ref <1.0)

## 2018-06-07 LAB — RPR: RPR: NONREACTIVE

## 2018-06-07 MED ORDER — VANCOMYCIN HCL 10 G IV SOLR
1500.0000 mg | INTRAVENOUS | Status: DC
  Filled 2018-06-07: qty 1500

## 2018-06-07 MED ORDER — SODIUM CHLORIDE 0.9 % IV SOLN
1.0000 g | INTRAVENOUS | Status: DC
  Administered 2018-06-07 – 2018-06-08 (×2): 1 g via INTRAVENOUS
  Filled 2018-06-07 (×3): qty 1

## 2018-06-07 MED ORDER — VANCOMYCIN HCL 10 G IV SOLR
2000.0000 mg | Freq: Once | INTRAVENOUS | Status: AC
  Administered 2018-06-07: 2000 mg via INTRAVENOUS
  Filled 2018-06-07: qty 2000

## 2018-06-07 NOTE — Plan of Care (Signed)
  Problem: Activity: Goal: Capacity to carry out activities will improve Outcome: Progressing   Problem: Activity: Goal: Risk for activity intolerance will decrease Outcome: Progressing   Problem: Coping: Goal: Level of anxiety will decrease Outcome: Progressing   

## 2018-06-07 NOTE — Progress Notes (Signed)
Pharmacy Antibiotic Note  Carla Compton is a 37 y.o. female admitted on 06/05/2018 with multiple complaints found to have likely osteomyelitis on L toe.  Pharmacy has been consulted for vancomycin and cefepime dosing. Pt afebrile, WBC wnl, SCr elevated but appears close to recent baseline.  Plan: -Vancomycin 2000mg  x1 then 1500mg  q48h -Cefepime 1g q24h -Monitor LOT, cultures, vancomycin levels as needed -Will need to follow renal function closely  Height: 5\' 2"  (157.5 cm) Weight: 269 lb 3.2 oz (122.1 kg)(scale b) IBW/kg (Calculated) : 50.1  Temp (24hrs), Avg:98.3 F (36.8 C), Min:97.7 F (36.5 C), Max:99 F (37.2 C)  Recent Labs  Lab 06/05/18 2335 06/06/18 0624 06/07/18 0419  WBC 5.7  --   --   CREATININE 3.77* 3.69* 3.79*    Estimated Creatinine Clearance: 25.8 mL/min (A) (by C-G formula based on SCr of 3.79 mg/dL (H)).    Allergies  Allergen Reactions  . Contrast Media [Iodinated Diagnostic Agents]     Antimicrobials this admission: Augmentin 12/14 >> 12/15 Cefepime 12/15 >>  Vancomycin 12/15 >>  Dose adjustments this admission: none  Microbiology results: none  Thank you for allowing pharmacy to be a part of this patient's care.  Arrie Senate, PharmD, BCPS Clinical Pharmacist Please check AMION for all [facility] Pharmacy numbers 06/07/2018

## 2018-06-07 NOTE — Progress Notes (Signed)
PROGRESS NOTE  Carla Compton OZH:[id number] DOB: 03-Apr-1981 DOA: 06/05/2018 PCP: No primary care provider on file.  HPI/Recap of past 24 hours:  Carla Abdullahis a 37 y.o.femalewith medical history significant ofDM2, CKD stage 4-5, HTN. Patient was recently admitted to our service from Dec 1-7 with CHF exacerbation. 2d echo showed EF 62-95%, grade 2 diastolic dysfunction. Of concern creat was ranging between 3-3.98 during that admission. Patient was diuresed and discharged on torsemide 2m daily.  Patient came to ED at OSH for indigestion symptoms but was admitted for creat 4.34 (BNP 340 on admit).  06/06/18: Patient seen and examined her bedside.  Reports pleuritic pain worse when she takes a deep breath.  No palpitations.  Currently being diuresed.  06/08/2018: Patient seen and examined at her bedside.  Reports right foot pain which appears edematous and tender on palpation.  X-ray of right foot revealed possible osteomyelitis.  Started on IV vancomycin and IV cefepime for presumed right foot osteomyelitis.  MRI right foot, CRP and ESR ordered.  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 (GFR 15-29) (HCC)   CKD (chronic kidney disease) stage 4, GFR 15-29 ml/min (HCC)  Fluid overload suspect multifactorial secondary to worsening advanced renal failure versus acute on chronic combined diastolic and systolic CHF Currently on torsemide due to acute on chronic systolic Monitor urine output Continue strict I's and O's Start renal dialysis diet Obtain nephrology consult Monitor fluid status  Suspected right foot osteomyelitis X-ray right foot revealed possible osteomyelitis involving third through fifth metatarsals lateral cuneiform and cuboid Obtain CRP and ESR Obtain MRI with no contrast right foot Start IV  vancomycin and IV cefepime empirically Obtain general surgery consult  AKI on CKD 4 Baseline appears to be 3.0 with GFR of 22 Worsening creatinine 3.79 from 3.69 Avoid nephrotoxic agents Monitor urine output Repeat BMP in the morning Obtain renal ultrasound Obtain nephrology consult  Acute on chronic combined diastolic and systolic CHF Last 2D echo done on 05/25/2018 revealed LVEF 45 to 50% with diffuse hypokinesis and grade 2 diastolic dysfunction BNP greater than 300 Strict I's and O's and daily weight Continue cardiac medications  Anemia of chronic disease Hemoglobin stable at 8.4 No sign of overt bleeding Obtain iron studies  Severe morbid obesity BMI 49 Recommend weight loss outpatient with healthy dieting and regular exercise  Intermittent right ear pain unclear etiology No drainage Pain management in place  DVT prophylaxis: Heparin [place] 3 times daily Code Status: Full Family Communication: No family in room Disposition Plan:  Home when nephrology and general surgery signed off Consults called:  Nephrology and general surgery    Objective: Vitals:   06/06/18 2318 06/07/18 0516 06/07/18 0802 06/07/18 1141  BP: 112/66 118/72 98/62 119/73  Pulse: 89 88 83 81  Resp: '18 18  18  ' Temp: 98.7 F (37.1 C) 98.2 F (36.8 C)  97.7 F (36.5 C)  TempSrc: Oral Oral  Oral  SpO2: 98% 98% 98% 99%  Weight:  122.1 kg    Height:        Intake/Output Summary (Last 24 hours) at 06/07/2018 1614 Last data filed at 06/07/2018 0949 Gross per 24 hour  Intake 1320 ml  Output 400 ml  Net 920 ml   Filed Weights   06/05/18 2150 06/06/18 0452 06/07/18 0516  Weight: 122.5 kg 121.7 kg 122.1 kg    Exam:  .  General: 37 y.o. year-old female well developed well nourished.  In no acute distress.  Alert and oriented x3. . Cardiovascular: Regular rate and rhythm with no rubs or gallops.  No thyromegaly or JVD noted.   Marland Kitchen Respiratory: Mild rales at bases with no wheezes. Good inspiratory  effort. . Abdomen: Soft nontender nondistended with normal bowel sounds x4 quadrants. . Musculoskeletal: 1+ pitting edema in lower extremities. . Skin: Right lateral foot blister with swelling. Marland Kitchen Psychiatry: Mood is appropriate for condition and setting   Data Reviewed: CBC: Recent Labs  Lab 06/05/18 2335  WBC 5.7  NEUTROABS 3.4  HGB 8.4*  HCT 27.2*  MCV 91.0  PLT 976   Basic Metabolic Panel: Recent Labs  Lab 06/05/18 2335 06/06/18 0624 06/07/18 0419  NA 138 139 138  K 5.0 4.9 5.1  CL 111 111 109  CO2 17* 21* 18*  GLUCOSE 98 92 70  BUN 36* 36* 35*  CREATININE 3.77* 3.69* 3.79*  CALCIUM 8.2* 8.2* 8.1*   GFR: Estimated Creatinine Clearance: 25.8 mL/min (A) (by C-G formula based on SCr of 3.79 mg/dL (H)). Liver Function Tests: Recent Labs  Lab 06/05/18 2335  AST 14*  ALT 11  ALKPHOS 72  BILITOT 0.6  PROT 6.2*  ALBUMIN 2.1*   No results for input(s): LIPASE, AMYLASE in the last 168 hours. No results for input(s): AMMONIA in the last 168 hours. Coagulation Profile: No results for input(s): INR, PROTIME in the last 168 hours. Cardiac Enzymes: No results for input(s): CKTOTAL, CKMB, CKMBINDEX, TROPONINI in the last 168 hours. BNP (last 3 results) No results for input(s): PROBNP in the last 8760 hours. HbA1C: No results for input(s): HGBA1C in the last 72 hours. CBG: Recent Labs  Lab 06/06/18 1632 06/06/18 2049 06/07/18 0727 06/07/18 0752 06/07/18 0827  GLUCAP 85 79 64* 66* 83   Lipid Profile: No results for input(s): CHOL, HDL, LDLCALC, TRIG, CHOLHDL, LDLDIRECT in the last 72 hours. Thyroid Function Tests: No results for input(s): TSH, T4TOTAL, FREET4, T3FREE, THYROIDAB in the last 72 hours. Anemia Panel: No results for input(s): VITAMINB12, FOLATE, FERRITIN, TIBC, IRON, RETICCTPCT in the last 72 hours. Urine analysis: No results found for: COLORURINE, APPEARANCEUR, LABSPEC, PHURINE, GLUCOSEU, HGBUR, BILIRUBINUR, KETONESUR, PROTEINUR, UROBILINOGEN,  NITRITE, LEUKOCYTESUR Sepsis Labs: '@LABRCNTIP' (procalcitonin:4,lacticidven:4)  )No results found for this or any previous visit (from the past 240 hour(s)).    Studies: Dg Foot Complete Right  Result Date: 06/07/2018 CLINICAL DATA:  Diabetic patient with a wound on the lateral aspect of the right foot. EXAM: RIGHT FOOT COMPLETE - 3+ VIEW COMPARISON:  None. FINDINGS: Bony destructive change and periosteal reaction are seen in approximately the proximal 7 cm of the fifth metatarsal, almost entire fourth metatarsal and base of the third metatarsal. Bony destructive change is also seen in the cuboid and lateral cuneiform. Focal soft tissue prominence is seen adjacent to the fifth MTP joint which could be an abscess or blister. Soft tissues of the foot are markedly swollen. Flattening of the head of the second metatarsal is likely due to remote microfracture. IMPRESSION: Findings consistent with osteomyelitis in the third through fifth metatarsals, lateral cuneiform and cuboid. Diffuse soft tissue swelling about the foot. More focal swelling adjacent to the fifth MTP joint could be an abscess or blister. Electronically Signed   By: Inge Rise M.D.   On: 06/07/2018 15:11    Scheduled Meds: . amoxicillin-clavulanate  1 tablet Oral Q12H  . [START ON 06/08/2018] calcitRIOL  0.25 mcg Oral Once per  day on Mon Wed Fri  . carvedilol  12.5 mg Oral BID WC  . heparin  5,000 Units Subcutaneous Q8H  . hydrALAZINE  50 mg Oral TID  . insulin aspart  0-5 Units Subcutaneous QHS  . insulin aspart  0-9 Units Subcutaneous TID WC  . rosuvastatin  40 mg Oral QHS  . senna-docusate  1 tablet Oral BID  . sodium chloride flush  3 mL Intravenous Q12H  . torsemide  20 mg Oral Daily    Continuous Infusions: . sodium chloride       LOS: 2 days     Carla Memos, MD [facility] Pager [phone]  If 7PM-7AM, please contact night-coverage [email or web address] Password [email or web address] 06/07/2018, 4:14 PM

## 2018-06-08 ENCOUNTER — Inpatient Hospital Stay (HOSPITAL_COMMUNITY): Payer: BLUE CROSS/BLUE SHIELD

## 2018-06-08 ENCOUNTER — Encounter (HOSPITAL_COMMUNITY): Payer: Self-pay | Admitting: Nurse Practitioner

## 2018-06-08 LAB — BASIC METABOLIC PANEL
Anion gap: 10 (ref 5–15)
BUN: 35 mg/dL — ABNORMAL HIGH (ref 6–20)
CO2: 17 mmol/L — ABNORMAL LOW (ref 22–32)
Calcium: 8 mg/dL — ABNORMAL LOW (ref 8.9–10.3)
Chloride: 110 mmol/L (ref 98–111)
Creatinine, Ser: 3.98 mg/dL — ABNORMAL HIGH (ref 0.44–1.00)
GFR calc Af Amer: 16 mL/min — ABNORMAL LOW (ref >60)
GFR calc non Af Amer: 14 mL/min — ABNORMAL LOW (ref >60)
Glucose, Bld: 132 mg/dL — ABNORMAL HIGH (ref 70–99)
POTASSIUM: 5.4 mmol/L — AB (ref 3.5–5.1)
Sodium: 137 mmol/L (ref 135–145)

## 2018-06-08 LAB — GLUCOSE, CAPILLARY
Glucose-Capillary: 71 mg/dL (ref 70–99)
Glucose-Capillary: 104 mg/dL — ABNORMAL HIGH (ref 70–99)
Glucose-Capillary: 132 mg/dL — ABNORMAL HIGH (ref 70–99)
Glucose-Capillary: 186 mg/dL — ABNORMAL HIGH (ref 70–99)
Glucose-Capillary: 105 mg/dL — ABNORMAL HIGH (ref 70–99)
Glucose-Capillary: 111 mg/dL — ABNORMAL HIGH (ref 70–99)
Glucose-Capillary: 154 mg/dL — ABNORMAL HIGH (ref 70–99)

## 2018-06-08 LAB — CBC WITH DIFFERENTIAL/PLATELET
ABS IMMATURE GRANULOCYTES: 0.02 10*3/uL (ref 0.00–0.07)
BASOS PCT: 1 %
Basophils Absolute: 0 10*3/uL (ref 0.0–0.1)
Eosinophils Absolute: 0.5 10*3/uL (ref 0.0–0.5)
Eosinophils Relative: 10 %
HCT: 26.1 % — ABNORMAL LOW (ref 36.0–46.0)
Hemoglobin: 7.8 g/dL — ABNORMAL LOW (ref 12.0–15.0)
Immature Granulocytes: 0 %
Lymphocytes Relative: 29 %
Lymphs Abs: 1.4 10*3/uL (ref 0.7–4.0)
MCH: 27.4 pg (ref 26.0–34.0)
MCHC: 29.9 g/dL — ABNORMAL LOW (ref 30.0–36.0)
MCV: 91.6 fL (ref 80.0–100.0)
Monocytes Absolute: 0.4 10*3/uL (ref 0.1–1.0)
Monocytes Relative: 9 %
Neutro Abs: 2.6 10*3/uL (ref 1.7–7.7)
Neutrophils Relative %: 51 %
PLATELETS: 343 10*3/uL (ref 150–400)
RBC: 2.85 MIL/uL — ABNORMAL LOW (ref 3.87–5.11)
RDW: 14.1 % (ref 11.5–15.5)
WBC: 5 10*3/uL (ref 4.0–10.5)
nRBC: 0 % (ref 0.0–0.2)

## 2018-06-08 LAB — HSV 1 ANTIBODY, IGG: HSV 1 Glycoprotein G Ab, IgG: <0.91 index (ref 0.00–0.90)

## 2018-06-08 LAB — HSV 2 ANTIBODY, IGG: HSV 2 Glycoprotein G Ab, IgG: <0.91 index (ref 0.00–0.90)

## 2018-06-08 LAB — POTASSIUM: Potassium: 4.9 mmol/L (ref 3.5–5.1)

## 2018-06-08 MED ORDER — DARBEPOETIN ALFA 200 MCG/0.4ML IJ SOSY
200.0000 ug | PREFILLED_SYRINGE | INTRAMUSCULAR | Status: DC
  Administered 2018-06-10: 200 ug via SUBCUTANEOUS
  Filled 2018-06-08 (×2): qty 0.4

## 2018-06-08 MED ORDER — CALCIUM GLUCONATE-NACL 1-0.675 GM/50ML-% IV SOLN
1.0000 g | Freq: Once | INTRAVENOUS | Status: AC
  Administered 2018-06-08: 1000 mg via INTRAVENOUS
  Filled 2018-06-08: qty 50

## 2018-06-08 MED ORDER — SODIUM POLYSTYRENE SULFONATE 15 GM/60ML PO SUSP
30.0000 g | Freq: Once | ORAL | Status: AC
  Administered 2018-06-08: 30 g via ORAL
  Filled 2018-06-08: qty 120

## 2018-06-08 MED ORDER — TORSEMIDE 20 MG PO TABS
40.0000 mg | ORAL_TABLET | Freq: Every day | ORAL | Status: DC
  Administered 2018-06-09 – 2018-06-10 (×2): 40 mg via ORAL
  Filled 2018-06-08 (×2): qty 2

## 2018-06-08 NOTE — Consult Note (Signed)
[facility] Nurse wound consult note Patient receiving care in [place].  Female visitor present at the time of my evaluation. Reason for Consult: Left 5th toe wound and right foot blister Wound type: Right foot is infectious and osteomyelitis confirmed by CT today.  Under the care of [name] for the right foot.  The left lateral 5th toe has a very small callus without any drainage expressed when palpated.  The patient states she has been cleaning it daily with saline and placing a dry gauze to the area.  This care order has been entered. Monitor the wound area(s) for worsening of condition such as: Signs/symptoms of infection,  Increase in size,  Development of or worsening of odor, Development of pain, or increased pain at the affected locations.  Notify the medical team if any of these develop.  Thank you for the consult.  Discussed plan of care with the patient and bedside nurse.  [place] nurse will not follow at this time.  Please re-consult the [place] team if needed.  Val Riles, RN, MSN, CWOCN, CNS-BC, pager [phone]

## 2018-06-08 NOTE — Consult Note (Signed)
[facility] Renal Consultation Note  Requesting MD: [place] Crane Indication for Consultation: advanced CKD- volume overload   HPI:  Carla Compton is a 37 y.o. female with past medical history significant for type 2 diabetes mellitus, hypertension, combined congestive heart failure with recent EF 45-50 with grade 2 diastolic dysfunction as well as stage IV/V CKD.  Patient was hospitalized from 12/1 to 12/7 with CHF exacerbation-she was diuresed and discharged on torsemide 40 mg daily.  She then returned to medical attention at an outside hospital  with unrelated symptoms but found to have an elevated creatinine so admitted.  Torsemide held, given IV fluids -then developed volume overloaded so was transferred to [facility] on 12/14.  Has been diuresed.  Creatinine really stable last several days.  Then was noted to develop right foot swelling, x-ray consistent with osteomyelitis-elevated ESR/CRP so started on vancomycin and cefepime.  Again, creatinine pretty stable but not good at 3.98.  900 of urine output recorded today-is not currently on Lasix.  Initial potassium today was 5.4 with recheck 4.9.  Also of note hemoglobin 7.8.  Does not appear uremic-followed by a nephrologist near [place].  She tells me that 6 months ago she believes her kidney function was normal, she just had proteinuria.  Other evidence states creatinine was 2.2 in August 2019 - a biopsy only shows diabetic glomerulosclerosis  Creatinine, Ser  Date/Time Value Ref Range Status  06/08/2018 04:23 AM 3.98 (H) 0.44 - 1.00 mg/dL Final  06/07/2018 04:19 AM 3.79 (H) 0.44 - 1.00 mg/dL Final  06/06/2018 06:24 AM 3.69 (H) 0.44 - 1.00 mg/dL Final  06/05/2018 11:35 PM 3.77 (H) 0.44 - 1.00 mg/dL Final  05/30/2018 05:21 AM 3.98 (H) 0.44 - 1.00 mg/dL Final  05/29/2018 05:50 AM 3.88 (H) 0.44 - 1.00 mg/dL Final  05/28/2018 04:42 AM 3.27 (H) 0.44 - 1.00 mg/dL Final  05/27/2018 03:49 AM 3.29 (H) 0.44 - 1.00 mg/dL Final  05/26/2018  04:57 AM 3.28 (H) 0.44 - 1.00 mg/dL Final  05/25/2018 06:18 AM 3.18 (H) 0.44 - 1.00 mg/dL Final  05/24/2018 07:28 PM 3.05 (H) 0.44 - 1.00 mg/dL Final     PMHx:   Past Medical History:  Diagnosis Date  . Benign essential HTN   . CKD (chronic kidney disease) stage 3, GFR 30-59 ml/min (HCC)   . Diabetes ([place])     History reviewed. No pertinent surgical history.  Family Hx:  Family History  Problem Relation Age of Onset  . Mitral valve prolapse Mother   . Hypertension Mother   . Hypertension Father   . Diabetes Father   . Diabetes Maternal Grandmother     Social History:  reports that she has never smoked. She has never used smokeless tobacco. She reports that she does not drink alcohol or use drugs.  Allergies:  Allergies  Allergen Reactions  . Contrast Media [Iodinated Diagnostic Agents]     Medications: Prior to Admission medications   Medication Sig Start Date End Date Taking? Authorizing Provider  acetaminophen (TYLENOL) 500 MG tablet Take 1,000 mg by mouth as needed for mild pain or headache.    [provider]  albuterol (PROAIR HFA) 108 (90 Base) MCG/ACT inhaler Inhale 2 puffs into the lungs every 4 (four) hours as needed for wheezing or shortness of breath.    [provider]  calcitRIOL (ROCALTROL) 0.25 MCG capsule Take 1 capsule (0.25 mcg total) by mouth 3 (three) times a week. 06/01/18 07/01/18  Donne Hazel, MD  carvedilol ([place]) 12.5  MG tablet Take 1 tablet (12.5 mg total) by mouth 2 (two) times daily with a meal. 05/30/18 06/29/18  Donne Hazel, MD  hydrALAZINE (APRESOLINE) 50 MG tablet Take 1 tablet (50 mg total) by mouth 3 (three) times daily. 05/30/18 06/29/18  Donne Hazel, MD  rosuvastatin (CRESTOR) 40 MG tablet Take 40 mg by mouth at bedtime. 03/23/18   [provider]  torsemide (DEMADEX) 20 MG tablet Take 2 tablets (40 mg total) by mouth daily. 05/31/18 06/30/18  Donne Hazel, MD    I have reviewed the patient's current  medications.  Labs:  Results for orders placed or performed during the hospital encounter of 06/05/18 (from the past 48 hour(s))  Glucose, capillary     Status: None   Collection Time: 06/06/18  4:32 PM  Result Value Ref Range   Glucose-Capillary 85 70 - 99 mg/dL  Glucose, capillary     Status: None   Collection Time: 06/06/18  8:49 PM  Result Value Ref Range   Glucose-Capillary 79 70 - 99 mg/dL  Basic metabolic panel     Status: Abnormal   Collection Time: 06/07/18  4:19 AM  Result Value Ref Range   Sodium 138 135 - 145 mmol/L   Potassium 5.1 3.5 - 5.1 mmol/L   Chloride 109 98 - 111 mmol/L   CO2 18 (L) 22 - 32 mmol/L   Glucose, Bld 70 70 - 99 mg/dL   BUN 35 (H) 6 - 20 mg/dL   Creatinine, Ser 3.79 (H) 0.44 - 1.00 mg/dL   Calcium 8.1 (L) 8.9 - 10.3 mg/dL   GFR calc non Af Amer 15 (L) >60 mL/min   GFR calc Af Amer 17 (L) >60 mL/min   Anion gap 11 5 - 15    Comment: Performed at [facility], [address] [place], [place] [address]  RPR     Status: None   Collection Time: 06/07/18  7:23 AM  Result Value Ref Range   RPR Ser Ql Non Reactive Non Reactive    Comment: (NOTE) Performed At: [facility] [place], [place] [id number] Rush Farmer MD [id number]   HSV 1 antibody, IgG     Status: None   Collection Time: 06/07/18  7:23 AM  Result Value Ref Range   HSV 1 Glycoprotein G Ab, IgG <0.91 0.00 - 0.90 index    Comment: (NOTE)                                 Negative        <0.91                                 Equivocal 0.91 - 1.09                                 Positive        >1.09 Note: Negative indicates no antibodies detected to HSV-1. Equivocal may suggest early infection.  If clinically appropriate, retest at later date. Positive indicates antibodies detected to HSV-1. Performed At: [facility] [place], [place] [id number] Rush Farmer MD [id number]   HSV 2 antibody, IgG     Status: None   Collection  Time: 06/07/18  7:23 AM  Result Value Ref Range  HSV 2 Glycoprotein G Ab, IgG <0.91 0.00 - 0.90 index    Comment: (NOTE)                                 Negative        <0.91                                 Equivocal 0.91 - 1.09                                 Positive        >1.09 Note: Negative indicates no antibodies detected to HSV-2. Equivocal may suggest early infection.  If clinically appropriate, retest at later date. Positive indicates antibodies detected to HSV-2. Performed At: [facility] [place], [place] [id number] Rush Farmer MD [id number]   Glucose, capillary     Status: Abnormal   Collection Time: 06/07/18  7:27 AM  Result Value Ref Range   Glucose-Capillary 64 (L) 70 - 99 mg/dL  Glucose, capillary     Status: Abnormal   Collection Time: 06/07/18  7:52 AM  Result Value Ref Range   Glucose-Capillary 66 (L) 70 - 99 mg/dL  Glucose, capillary     Status: None   Collection Time: 06/07/18  8:27 AM  Result Value Ref Range   Glucose-Capillary 83 70 - 99 mg/dL  Glucose, capillary     Status: None   Collection Time: 06/07/18 11:37 AM  Result Value Ref Range   Glucose-Capillary 71 70 - 99 mg/dL  C-reactive protein     Status: Abnormal   Collection Time: 06/07/18  4:41 PM  Result Value Ref Range   CRP 14.8 (H) <1.0 mg/dL    Comment: Performed at [facility], [place] [address], [place], [place] [address]  Glucose, capillary     Status: Abnormal   Collection Time: 06/07/18  4:41 PM  Result Value Ref Range   Glucose-Capillary 104 (H) 70 - 99 mg/dL  Sedimentation rate     Status: Abnormal   Collection Time: 06/07/18  5:55 PM  Result Value Ref Range   Sed Rate 135 (H) 0 - 22 mm/hr    Comment: Performed at [facility], [place] [address], [place], [place] [address]  Glucose, capillary     Status: Abnormal   Collection Time: 06/07/18  9:37 PM  Result Value Ref Range   Glucose-Capillary 132 (H) 70 - 99 mg/dL  Basic metabolic panel      Status: Abnormal   Collection Time: 06/08/18  4:23 AM  Result Value Ref Range   Sodium 137 135 - 145 mmol/L   Potassium 5.4 (H) 3.5 - 5.1 mmol/L   Chloride 110 98 - 111 mmol/L   CO2 17 (L) 22 - 32 mmol/L   Glucose, Bld 132 (H) 70 - 99 mg/dL   BUN 35 (H) 6 - 20 mg/dL   Creatinine, Ser 3.98 (H) 0.44 - 1.00 mg/dL   Calcium 8.0 (L) 8.9 - 10.3 mg/dL   GFR calc non Af Amer 14 (L) >60 mL/min   GFR calc Af Amer 16 (L) >60 mL/min   Anion gap 10 5 - 15    Comment: Performed at [facility] [address], [place], [place] [address]  CBC with  Differential/Platelet     Status: Abnormal   Collection Time: 06/08/18  4:23 AM  Result Value Ref Range   WBC 5.0 4.0 - 10.5 K/uL   RBC 2.85 (L) 3.87 - 5.11 MIL/uL   Hemoglobin 7.8 (L) 12.0 - 15.0 g/dL   HCT 26.1 (L) 36.0 - 46.0 %   MCV 91.6 80.0 - 100.0 fL   MCH 27.4 26.0 - 34.0 pg   MCHC 29.9 (L) 30.0 - 36.0 g/dL   RDW 14.1 11.5 - 15.5 %   Platelets 343 150 - 400 K/uL   nRBC 0.0 0.0 - 0.2 %   Neutrophils Relative % 51 %   Neutro Abs 2.6 1.7 - 7.7 K/uL   Lymphocytes Relative 29 %   Lymphs Abs 1.4 0.7 - 4.0 K/uL   Monocytes Relative 9 %   Monocytes Absolute 0.4 0.1 - 1.0 K/uL   Eosinophils Relative 10 %   Eosinophils Absolute 0.5 0.0 - 0.5 K/uL   Basophils Relative 1 %   Basophils Absolute 0.0 0.0 - 0.1 K/uL   Immature Granulocytes 0 %   Abs Immature Granulocytes 0.02 0.00 - 0.07 K/uL    Comment: Performed at [facility], [address], [place], [place] [address]  Glucose, capillary     Status: Abnormal   Collection Time: 06/08/18  7:22 AM  Result Value Ref Range   Glucose-Capillary 186 (H) 70 - 99 mg/dL  Glucose, capillary     Status: Abnormal   Collection Time: 06/08/18 12:25 PM  Result Value Ref Range   Glucose-Capillary 105 (H) 70 - 99 mg/dL  Potassium     Status: None   Collection Time: 06/08/18  2:29 PM  Result Value Ref Range   Potassium 4.9 3.5 - 5.1 mmol/L    Comment: Performed at [facility], [place]  [address] [place], [place] [address]  Glucose, capillary     Status: Abnormal   Collection Time: 06/08/18  4:22 PM  Result Value Ref Range   Glucose-Capillary 111 (H) 70 - 99 mg/dL     ROS:  Pertinent items are noted in HPI.  Physical Exam: Vitals:   06/07/18 1925 06/08/18 0453  BP: 121/72 126/73  Pulse: 86 85  Resp: 20 20  Temp: 97.8 F (36.6 C) 98.1 F (36.7 C)  SpO2: 97% 99%     General: Obese, alert black female in no acute distress HEENT: Pupils are equally round and reactive to light, extraocular motions are intact, mucous membranes are moist Neck: No appreciable JVD Heart: Regular rate and rhythm Lungs: Decreased breath sounds at the bases Abdomen: Obese, soft, nontender  Extremities: Pitting edema bilaterally Skin: Warm and dry Neuro: Alert and nonfocal  Assessment/Plan: 37 year old black female with biopsy-proven diabetic glomerulosclerosis with nephrotic syndrome who has had rocky course of late with episodes of AKI and volume overload 1.Renal-patient appears to have severe diabetic glomerulosclerosis.  As a result she has had fairly rapidly worsening renal function.  I suspect her rising creatinine with diuresis is just an unmasking of what her true GFR is and not necessarily worsening of it-in any event creatinine has been fairly stable albeit poor.  There are no indications for dialysis at this time.  I will continue to follow her closely 2. Hypertension/volume  -she appears volume overloaded.  I will resume her diuretics at torsemide 40-because her blood pressure is good I am going to discontinue her hydralazine as that can also cause edema 3.  Osteomyelitis-new diagnosis.  Be careful  with the vancomycin levels-also on Maxipime 4. Anemia  -this is not helping her third spacing situation.  I will check iron stores and begin an ESA 5.  Bones-check PTH.  I see that she is already on calcitriol likely from her nephrologist 6.  Hyperkalemia-had been on supplementation in the  past.  Status post 1 dose of Kayexalate today with an improved level.  I suspect it will go down with diuresis   Louis Meckel 06/08/2018, 4:26 PM

## 2018-06-08 NOTE — Plan of Care (Signed)
  Problem: Activity: Goal: Capacity to carry out activities will improve Outcome: Progressing   Problem: Activity: Goal: Risk for activity intolerance will decrease Outcome: Progressing   Problem: Coping: Goal: Level of anxiety will decrease Outcome: Progressing   

## 2018-06-08 NOTE — Consult Note (Signed)
[facility] Nurse wound consult note Attempted to see patient in [place], however, patient was out of the room. Val Riles, RN, MSN, CWOCN, CNS-BC, pager [phone]

## 2018-06-08 NOTE — Progress Notes (Signed)
Wound care done to left 5th digit as ordered. Site pink, no discharge.

## 2018-06-08 NOTE — Progress Notes (Addendum)
PROGRESS NOTE  Carla Compton ZOX:[id number] DOB: 10-06-1980 DOA: 06/05/2018 PCP: No primary care provider on file.  HPI/Recap of past 24 hours:  Carla Compton a 37 y.o.femalewith medical history significant ofDM2, CKD stage 4-5, HTN. Patient was recently admitted to our service from Dec 1-7 with CHF exacerbation. 2d echo showed EF 81-19%, grade 2 diastolic dysfunction. Of concern creat was ranging between 3-3.98 during that admission. Patient was diuresed and discharged on torsemide 49m daily.  Patient came to ED at OSH for indigestion symptoms but was admitted for creat 4.34 (BNP 340 on admit).  06/06/18: Currently being diuresed.  06/08/2018: Right foot swelling.  X-ray of right foot revealed possible osteomyelitis.  Started on IV vancomycin and IV cefepime for presumed right foot osteomyelitis.  MRI right foot, CRP and ESR ordered.  06/09/18: Patient seen and examined at her bedside.  No acute events overnight.  Elevated ESR and CRP consistent with osteomyelitis.  MRI right foot reveals osteomyelitis of the bases of the second through fifth metatarsals and of the lateral cuneiform and cuboid with possible soft tissue abscess adjacent to the plantar aspect of the fifth MTP joint.  On IV antibiotics empirically.  Orthopedic surgery consulted and following.  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 (GFR 15-29) (HCC)   CKD (chronic kidney disease) stage 4, GFR 15-29 ml/min (HCC)  Fluid overload suspect multifactorial secondary to worsening advanced renal failure versus acute on chronic combined diastolic and systolic CHF Holding of torsemide due to worsening AKI on CKD 4 Continue monitor urine output Continue strict I's and O's Start renal dialysis diet Nephrology consulted and will see the patient  Right  foot osteomyelitis X-ray right foot revealed possible osteomyelitis involving third through fifth metatarsals lateral cuneiform and cuboid confirmed by MRI CRP 14 and ESR 135  Continue IV vancomycin and IV cefepime empirically  Orthopedic surgery consulted following  Hyperkalemia Suspect secondary to advanced renal failure Treated with IV calcium gluconate 1 g once and 30 g of p.o. Kayexalate once Repeat BMP in the morning  Left fifth toe wound, present admission Wound care specialist consulted.  Appreciate recommendations  Type 2 diabetes with hyperglycemia Last A1c 7.2 on 05/26/2018 Continue insulin sliding scale  Diabetes polyneuropathy Obtain ABI  AKI on CKD 4 Baseline appears to be 3.0 with GFR of 22 Creatinine worsening to 3.99 from 3.79 from 3.69 600 cc urine output recorded in last 24 hours Continue to avoid nephrotoxic agent Holding torsemide for now Nephrology consulted  Acute on chronic combined diastolic and systolic CHF Last 2D echo done on 05/25/2018 revealed LVEF 45 to 50% with diffuse hypokinesis and grade 2 diastolic dysfunction BNP greater than 300 Continue strict I's and O's and daily weight Continue cardiac medications Here to monitor volume status  Anemia of chronic disease Hemoglobin 7.8 from 8.4 No sign of overt bleeding Obtain iron studies Obtain FOBT  Severe morbid obesity BMI 49 Recommend weight loss outpatient with healthy dieting and regular exercise  Resolving intermittent right ear pain unclear etiology No drainage Pain management in place  DVT prophylaxis: Heparin [place] 3 times daily Code Status: Full Family Communication: No family in room Disposition Plan:  Home when nephrology and general surgery signed off Consults called:  Nephrology and general surgery    Objective: Vitals:   06/07/18 1141 06/07/18 1826 06/07/18 1925 06/08/18 0453  BP: 119/73 135/84 121/72  126/73  Pulse: 81 81 86 85  Resp: '18  20 20  ' Temp: 97.7 F (36.5  C)  97.8 F (36.6 C) 98.1 F (36.7 C)  TempSrc: Oral  Oral Oral  SpO2: 99%  97% 99%  Weight:    122.4 kg  Height:        Intake/Output Summary (Last 24 hours) at 06/08/2018 1440 Last data filed at 06/08/2018 1205 Gross per 24 hour  Intake 1093.56 ml  Output 600 ml  Net 493.56 ml   Filed Weights   06/06/18 0452 06/07/18 0516 06/08/18 0453  Weight: 121.7 kg 122.1 kg 122.4 kg    Exam:  . General: 36 y.o. year-old female well-developed well-nourished in no acute distress.  Alert and oriented x3.   . Cardiovascular: Regular rate and rhythm with no rubs or gallops.  No JVD or thyromegaly noted . Respiratory: Mild rales at bases with no wheezes.  Good inspiratory effort.. . Abdomen: Soft nontender nondistended with normal bowel sounds x4 quadrants. . Musculoskeletal: 1+ pitting edema in lower extremities. . Skin: Right lateral foot blister with swelling.  Left fifth toe wound. Marland Kitchen Psychiatry: Mood is appropriate for condition and setting   Data Reviewed: CBC: Recent Labs  Lab 06/05/18 2335 06/08/18 0423  WBC 5.7 5.0  NEUTROABS 3.4 2.6  HGB 8.4* 7.8*  HCT 27.2* 26.1*  MCV 91.0 91.6  PLT 308 962   Basic Metabolic Panel: Recent Labs  Lab 06/05/18 2335 06/06/18 0624 06/07/18 0419 06/08/18 0423  NA 138 139 138 137  K 5.0 4.9 5.1 5.4*  CL 111 111 109 110  CO2 17* 21* 18* 17*  GLUCOSE 98 92 70 132*  BUN 36* 36* 35* 35*  CREATININE 3.77* 3.69* 3.79* 3.98*  CALCIUM 8.2* 8.2* 8.1* 8.0*   GFR: Estimated Creatinine Clearance: 24.6 mL/min (A) (by C-G formula based on SCr of 3.98 mg/dL (H)). Liver Function Tests: Recent Labs  Lab 06/05/18 2335  AST 14*  ALT 11  ALKPHOS 72  BILITOT 0.6  PROT 6.2*  ALBUMIN 2.1*   No results for input(s): LIPASE, AMYLASE in the last 168 hours. No results for input(s): AMMONIA in the last 168 hours. Coagulation Profile: No results for input(s): INR, PROTIME in the last 168 hours. Cardiac Enzymes: No results for input(s): CKTOTAL,  CKMB, CKMBINDEX, TROPONINI in the last 168 hours. BNP (last 3 results) No results for input(s): PROBNP in the last 8760 hours. HbA1C: No results for input(s): HGBA1C in the last 72 hours. CBG: Recent Labs  Lab 06/07/18 1137 06/07/18 1641 06/07/18 2137 06/08/18 0722 06/08/18 1225  GLUCAP 71 104* 132* 186* 105*   Lipid Profile: No results for input(s): CHOL, HDL, LDLCALC, TRIG, CHOLHDL, LDLDIRECT in the last 72 hours. Thyroid Function Tests: No results for input(s): TSH, T4TOTAL, FREET4, T3FREE, THYROIDAB in the last 72 hours. Anemia Panel: No results for input(s): VITAMINB12, FOLATE, FERRITIN, TIBC, IRON, RETICCTPCT in the last 72 hours. Urine analysis: No results found for: COLORURINE, APPEARANCEUR, LABSPEC, PHURINE, GLUCOSEU, HGBUR, BILIRUBINUR, KETONESUR, PROTEINUR, UROBILINOGEN, NITRITE, LEUKOCYTESUR Sepsis Labs: '@LABRCNTIP' (procalcitonin:4,lacticidven:4)  )No results found for this or any previous visit (from the past 240 hour(s)).    Studies: Mr Foot Right Wo Contrast  Result Date: 06/08/2018 CLINICAL DATA:  Osteomyelitis. EXAM: MRI OF THE RIGHT FOREFOOT WITHOUT CONTRAST TECHNIQUE: Multiplanar, multisequence MR imaging of the right forefoot was performed. No intravenous contrast was administered due to impaired renal function. COMPARISON:  Radiographs dated 04/07/2018 FINDINGS: Bones/Joint/Cartilage There is abnormal signal and bone destruction and cortical  thickening and irregularity of the bases of second, third, fourth, and fifth metatarsals. Is abnormal signal and bone destruction involving the cuboid and lateral cuneiform. There is fragmentation of the base of the fifth metatarsal. There has focal erosion of the base of the first metatarsal at the insertion of the peroneus longus tendon. There is an effusion at the IP joint of the great toe without bone destruction. There are small ankle joint and subtalar joint effusions as well as a small effusion at the talonavicular  joint. Muscles and Tendons No acute abnormality. Soft tissues There is a focal subcutaneous fluid collection adjacent to the plantar aspect of the fifth MTP joint measuring 22 x 18 x 11 mm which may represent an abscess. There is prominent dorsal subcutaneous edema, nonspecific. IMPRESSION: 1. 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 2. Possible soft tissue abscess adjacent to the plantar aspect of the fifth MTP joint. 3. Subcutaneous edema of the forefoot, nonspecific but this could represent cellulitis. 4. Multiple joint effusions, nonspecific. Electronically Signed   By: Lorriane Shire M.D.   On: 06/08/2018 11:40    Scheduled Meds: . calcitRIOL  0.25 mcg Oral Once per day on Mon Wed Fri  . carvedilol  12.5 mg Oral BID WC  . heparin  5,000 Units Subcutaneous Q8H  . hydrALAZINE  50 mg Oral TID  . insulin aspart  0-5 Units Subcutaneous QHS  . insulin aspart  0-9 Units Subcutaneous TID WC  . rosuvastatin  40 mg Oral QHS  . senna-docusate  1 tablet Oral BID  . sodium chloride flush  3 mL Intravenous Q12H    Continuous Infusions: . sodium chloride Stopped (06/08/18 0826)  . ceFEPime (MAXIPIME) IV 200 mL/hr at 06/07/18 1833  . [START ON 06/09/2018] vancomycin       LOS: 3 days     Kayleen Memos, MD [facility] Pager [phone]  If 7PM-7AM, please contact night-coverage [email or web address] Password [email or web address] 06/08/2018, 2:40 PM

## 2018-06-08 NOTE — Consult Note (Signed)
Reason for Consult:Right foot osteo Referring Physician: [name] is an 37 y.o. female.  HPI: Carla Compton was admitted several days ago for a CHF exacerbation. Around that time she had a blister come up on her right foot. Prior to that she was not having any issues with it. She denies prior hx/o foot problems although does admit to neuropathy in her feet. She denies fevers, chills, sweats, N/V, pain with ambulation, or hx/o gout.  Past Medical History:  Diagnosis Date  . Benign essential HTN   . CKD (chronic kidney disease) stage 3, GFR 30-59 ml/min (HCC)   . Diabetes ([place])     History reviewed. No pertinent surgical history.  Family History  Problem Relation Age of Onset  . Mitral valve prolapse Mother   . Hypertension Mother   . Hypertension Father   . Diabetes Father   . Diabetes Maternal Grandmother     Social History:  reports that she has never smoked. She has never used smokeless tobacco. She reports that she does not drink alcohol or use drugs.  Allergies:  Allergies  Allergen Reactions  . Contrast Media [Iodinated Diagnostic Agents]     Medications: I have reviewed the patient's current medications.  Results for orders placed or performed during the hospital encounter of 06/05/18 (from the past 48 hour(s))  Glucose, capillary     Status: None   Collection Time: 06/06/18 11:43 AM  Result Value Ref Range   Glucose-Capillary 89 70 - 99 mg/dL  Glucose, capillary     Status: None   Collection Time: 06/06/18  4:32 PM  Result Value Ref Range   Glucose-Capillary 85 70 - 99 mg/dL  Glucose, capillary     Status: None   Collection Time: 06/06/18  8:49 PM  Result Value Ref Range   Glucose-Capillary 79 70 - 99 mg/dL  Basic metabolic panel     Status: Abnormal   Collection Time: 06/07/18  4:19 AM  Result Value Ref Range   Sodium 138 135 - 145 mmol/L   Potassium 5.1 3.5 - 5.1 mmol/L   Chloride 109 98 - 111 mmol/L   CO2 18 (L) 22 - 32 mmol/L   Glucose, Bld  70 70 - 99 mg/dL   BUN 35 (H) 6 - 20 mg/dL   Creatinine, Ser 3.79 (H) 0.44 - 1.00 mg/dL   Calcium 8.1 (L) 8.9 - 10.3 mg/dL   GFR calc non Af Amer 15 (L) >60 mL/min   GFR calc Af Amer 17 (L) >60 mL/min   Anion gap 11 5 - 15    Comment: Performed at [facility], [address] [place],  [address]  RPR     Status: None   Collection Time: 06/07/18  7:23 AM  Result Value Ref Range   RPR Ser Ql Non Reactive Non Reactive    Comment: (NOTE) Performed At: [facility] [address] [place], [place] [id number] Rush Farmer MD OA:[id number]   Glucose, capillary     Status: Abnormal   Collection Time: 06/07/18  7:27 AM  Result Value Ref Range   Glucose-Capillary 64 (L) 70 - 99 mg/dL  Glucose, capillary     Status: Abnormal   Collection Time: 06/07/18  7:52 AM  Result Value Ref Range   Glucose-Capillary 66 (L) 70 - 99 mg/dL  Glucose, capillary     Status: None   Collection Time: 06/07/18  8:27 AM  Result Value Ref Range   Glucose-Capillary 83 70 - 99  mg/dL  Glucose, capillary     Status: None   Collection Time: 06/07/18 11:37 AM  Result Value Ref Range   Glucose-Capillary 71 70 - 99 mg/dL  Carla-reactive protein     Status: Abnormal   Collection Time: 06/07/18  4:41 PM  Result Value Ref Range   CRP 14.8 (H) <1.0 mg/dL    Comment: Performed at [facility] [address] [place], [place] [address]  Glucose, capillary     Status: Abnormal   Collection Time: 06/07/18  4:41 PM  Result Value Ref Range   Glucose-Capillary 104 (H) 70 - 99 mg/dL  Sedimentation rate     Status: Abnormal   Collection Time: 06/07/18  5:55 PM  Result Value Ref Range   Sed Rate 135 (H) 0 - 22 mm/hr    Comment: Performed at [facility], [place] [address] [place], [place] [address]  Glucose, capillary     Status: Abnormal   Collection Time: 06/07/18  9:37 PM  Result Value Ref Range   Glucose-Capillary 132 (H) 70 - 99 mg/dL  Basic metabolic panel     Status: Abnormal    Collection Time: 06/08/18  4:23 AM  Result Value Ref Range   Sodium 137 135 - 145 mmol/L   Potassium 5.4 (H) 3.5 - 5.1 mmol/L   Chloride 110 98 - 111 mmol/L   CO2 17 (L) 22 - 32 mmol/L   Glucose, Bld 132 (H) 70 - 99 mg/dL   BUN 35 (H) 6 - 20 mg/dL   Creatinine, Ser 3.98 (H) 0.44 - 1.00 mg/dL   Calcium 8.0 (L) 8.9 - 10.3 mg/dL   GFR calc non Af Amer 14 (L) >60 mL/min   GFR calc Af Amer 16 (L) >60 mL/min   Anion gap 10 5 - 15    Comment: Performed at [facility] [address] [place], [place] [address]  CBC with Differential/Platelet     Status: Abnormal   Collection Time: 06/08/18  4:23 AM  Result Value Ref Range   WBC 5.0 4.0 - 10.5 K/uL   RBC 2.85 (L) 3.87 - 5.11 MIL/uL   Hemoglobin 7.8 (L) 12.0 - 15.0 g/dL   HCT 26.1 (L) 36.0 - 46.0 %   MCV 91.6 80.0 - 100.0 fL   MCH 27.4 26.0 - 34.0 pg   MCHC 29.9 (L) 30.0 - 36.0 g/dL   RDW 14.1 11.5 - 15.5 %   Platelets 343 150 - 400 K/uL   nRBC 0.0 0.0 - 0.2 %   Neutrophils Relative % 51 %   Neutro Abs 2.6 1.7 - 7.7 K/uL   Lymphocytes Relative 29 %   Lymphs Abs 1.4 0.7 - 4.0 K/uL   Monocytes Relative 9 %   Monocytes Absolute 0.4 0.1 - 1.0 K/uL   Eosinophils Relative 10 %   Eosinophils Absolute 0.5 0.0 - 0.5 K/uL   Basophils Relative 1 %   Basophils Absolute 0.0 0.0 - 0.1 K/uL   Immature Granulocytes 0 %   Abs Immature Granulocytes 0.02 0.00 - 0.07 K/uL    Comment: Performed at [facility] [address] [place], [place] [address]  Glucose, capillary     Status: Abnormal   Collection Time: 06/08/18  7:22 AM  Result Value Ref Range   Glucose-Capillary 186 (H) 70 - 99 mg/dL    Dg Foot Complete Right  Result Date: 06/07/2018 CLINICAL DATA:  Diabetic patient with a wound on the lateral aspect of  the right foot. EXAM: RIGHT FOOT COMPLETE - 3+ VIEW COMPARISON:  None. FINDINGS: Bony destructive change and periosteal reaction are seen in approximately the proximal 7 cm of the fifth metatarsal, almost entire fourth  metatarsal and base of the third metatarsal. Bony destructive change is also seen in the cuboid and lateral cuneiform. Focal soft tissue prominence is seen adjacent to the fifth MTP joint which could be an abscess or blister. Soft tissues of the foot are markedly swollen. Flattening of the head of the second metatarsal is likely due to remote microfracture. IMPRESSION: Findings consistent with osteomyelitis in the third through fifth metatarsals, lateral cuneiform and cuboid. Diffuse soft tissue swelling about the foot. More focal swelling adjacent to the fifth MTP joint could be an abscess or blister. Electronically Signed   By: Inge Rise M.D.   On: 06/07/2018 15:11    Review of Systems  Constitutional: Negative for chills, fever and weight loss.  HENT: Negative for ear discharge, ear pain, hearing loss and tinnitus.   Eyes: Negative for blurred vision, double vision, photophobia and pain.  Respiratory: Negative for cough, sputum production and shortness of breath.   Cardiovascular: Negative for chest pain.  Gastrointestinal: Negative for abdominal pain, nausea and vomiting.  Genitourinary: Negative for dysuria, flank pain, frequency and urgency.  Musculoskeletal: Positive for joint pain (Blister on right foot). Negative for back pain, falls, myalgias and neck pain.  Neurological: Negative for dizziness, tingling, sensory change, focal weakness, loss of consciousness and headaches.  Endo/Heme/Allergies: Does not bruise/bleed easily.  Psychiatric/Behavioral: Negative for depression, memory loss and substance abuse. The patient is not nervous/anxious.    Blood pressure 126/73, pulse 85, temperature 98.1 F (36.7 Carla), temperature source Oral, resp. rate 20, height 5\' 2"  (1.575 m), weight 122.4 kg, last menstrual period 05/29/2018, SpO2 99 %. Physical Exam  Constitutional: She appears well-developed and well-nourished. No distress.  HENT:  Head: Normocephalic and atraumatic.  Eyes:  Conjunctivae are normal. Right eye exhibits no discharge. Left eye exhibits no discharge. No scleral icterus.  Neck: Normal range of motion.  Cardiovascular: Normal rate and regular rhythm.  Respiratory: Effort normal. No respiratory distress.  Musculoskeletal:     Comments: RLE No traumatic wounds, ecchymosis, or rash  1cm bulla lateral 5th MTP joint, mild TTP  No knee or ankle effusion  Knee stable to varus/ valgus and anterior/posterior stress  Sens SPN paresthetic, DPN, TN absent  Motor EHL, ext, flex, evers 5/5  DP 1+, PT 0, 1+ edema  Neurological: She is alert.  Skin: Skin is warm and dry. She is not diaphoretic.  Psychiatric: She has a normal mood and affect. Her behavior is normal.    Assessment/Plan: Right foot osteo -- Given x-ray appearance and elevated inflammatory markers this seems likely. Will await MRI, obtain ABI in meantime. [name] to evaluate.    Carla Abu, PA-[facility] [phone] 06/08/2018, 8:56 AM

## 2018-06-09 ENCOUNTER — Inpatient Hospital Stay (HOSPITAL_COMMUNITY): Payer: BLUE CROSS/BLUE SHIELD

## 2018-06-09 DIAGNOSIS — I5043 Acute on chronic combined systolic (congestive) and diastolic (congestive) heart failure: Secondary | ICD-10-CM

## 2018-06-09 DIAGNOSIS — E1142 Type 2 diabetes mellitus with diabetic polyneuropathy: Secondary | ICD-10-CM

## 2018-06-09 DIAGNOSIS — E43 Unspecified severe protein-calorie malnutrition: Secondary | ICD-10-CM

## 2018-06-09 DIAGNOSIS — L0390 Cellulitis, unspecified: Secondary | ICD-10-CM

## 2018-06-09 LAB — CBC WITH DIFFERENTIAL/PLATELET
Abs Immature Granulocytes: 0 10*3/uL (ref 0.00–0.07)
Basophils Absolute: 0 10*3/uL (ref 0.0–0.1)
Basophils Relative: 0 %
Eosinophils Absolute: 0.5 10*3/uL (ref 0.0–0.5)
Eosinophils Relative: 11 %
HCT: 26.2 % — ABNORMAL LOW (ref 36.0–46.0)
Hemoglobin: 7.9 g/dL — ABNORMAL LOW (ref 12.0–15.0)
Immature Granulocytes: 0 %
Lymphocytes Relative: 39 %
Lymphs Abs: 1.8 10*3/uL (ref 0.7–4.0)
MCH: 27.4 pg (ref 26.0–34.0)
MCHC: 30.2 g/dL (ref 30.0–36.0)
MCV: 91 fL (ref 80.0–100.0)
Monocytes Absolute: 0.3 10*3/uL (ref 0.1–1.0)
Monocytes Relative: 7 %
Neutro Abs: 2 10*3/uL (ref 1.7–7.7)
Neutrophils Relative %: 43 %
Platelets: 325 10*3/uL (ref 150–400)
RBC: 2.88 MIL/uL — AB (ref 3.87–5.11)
RDW: 13.8 % (ref 11.5–15.5)
WBC: 4.5 10*3/uL (ref 4.0–10.5)
nRBC: 0 % (ref 0.0–0.2)

## 2018-06-09 LAB — GLUCOSE, CAPILLARY
Glucose-Capillary: 90 mg/dL (ref 70–99)
Glucose-Capillary: 90 mg/dL (ref 70–99)
Glucose-Capillary: 149 mg/dL — ABNORMAL HIGH (ref 70–99)
Glucose-Capillary: 71 mg/dL (ref 70–99)
Glucose-Capillary: 91 mg/dL (ref 70–99)

## 2018-06-09 LAB — IRON AND TIBC
IRON: 42 ug/dL (ref 28–170)
Saturation Ratios: 21 % (ref 10.4–31.8)
TIBC: 204 ug/dL — ABNORMAL LOW (ref 250–450)
UIBC: 162 ug/dL

## 2018-06-09 LAB — BASIC METABOLIC PANEL
Anion gap: 11 (ref 5–15)
BUN: 31 mg/dL — ABNORMAL HIGH (ref 6–20)
CO2: 15 mmol/L — ABNORMAL LOW (ref 22–32)
Calcium: 8.1 mg/dL — ABNORMAL LOW (ref 8.9–10.3)
Chloride: 112 mmol/L — ABNORMAL HIGH (ref 98–111)
Creatinine, Ser: 3.83 mg/dL — ABNORMAL HIGH (ref 0.44–1.00)
GFR calc Af Amer: 17 mL/min — ABNORMAL LOW (ref >60)
GFR calc non Af Amer: 14 mL/min — ABNORMAL LOW (ref >60)
Glucose, Bld: 104 mg/dL — ABNORMAL HIGH (ref 70–99)
Potassium: 4.7 mmol/L (ref 3.5–5.1)
Sodium: 138 mmol/L (ref 135–145)

## 2018-06-09 LAB — PHOSPHORUS: Phosphorus: 6.2 mg/dL — ABNORMAL HIGH (ref 2.5–4.6)

## 2018-06-09 LAB — FERRITIN: Ferritin: 73 ng/mL (ref 11–307)

## 2018-06-09 LAB — URIC ACID: Uric Acid, Serum: 8.7 mg/dL — ABNORMAL HIGH (ref 2.5–7.1)

## 2018-06-09 MED ORDER — COLCHICINE 0.6 MG PO TABS
0.6000 mg | ORAL_TABLET | Freq: Two times a day (BID) | ORAL | Status: DC
  Administered 2018-06-09 – 2018-06-12 (×7): 0.6 mg via ORAL
  Filled 2018-06-09 (×7): qty 1

## 2018-06-09 MED ORDER — SODIUM BICARBONATE 650 MG PO TABS
650.0000 mg | ORAL_TABLET | Freq: Two times a day (BID) | ORAL | Status: DC
  Administered 2018-06-09 – 2018-06-14 (×11): 650 mg via ORAL
  Filled 2018-06-09 (×11): qty 1

## 2018-06-09 MED ORDER — SODIUM CHLORIDE 0.9 % IV SOLN
250.0000 mg | Freq: Every day | INTRAVENOUS | Status: AC
  Administered 2018-06-09 – 2018-06-12 (×4): 250 mg via INTRAVENOUS
  Filled 2018-06-09 (×5): qty 20

## 2018-06-09 NOTE — Evaluation (Signed)
Physical Therapy Evaluation & Discharge Patient Details Name: Carla Compton MRN: [id number] DOB: 1980-07-16 Today's Date: 06/09/2018   History of Present Illness  Pt is a 37 y.o. female admitted with c/o indigestion symptoms; found to have creatinine 4.34 and BNP 340. Worked up for CHF. Hospital course complicated by questionable R foot osteomyelitis incidentally found on R foot imaging. PMH includes uncontrolled DM, CKD4, diabetic polyneuropathy, DM2, morbid obesity, bilateral knee arthritis.    Clinical Impression  Patient evaluated by Physical Therapy with no further acute PT needs identified. PTA, pt indep and lives alone with supportive family nearby; works in [employer], structural health support. Today, pt mod indep with RW; denies R foot complaints, but does report intermittent bilateral knee soreness secondary to arthritis. Encouraged use of RW as needed to offload knees. SpO2 >92% on RA throughout. All education has been completed and the patient has no further questions. Acute PT is signing off. Thank you for this referral.    Follow Up Recommendations No PT follow up;Supervision - Intermittent    Equipment Recommendations  None recommended by PT    Recommendations for Other Services       Precautions / Restrictions Precautions Precautions: Fall Restrictions Weight Bearing Restrictions: No LLE Weight Bearing: Weight bearing as tolerated      Mobility  Bed Mobility Overal bed mobility: Modified Independent             General bed mobility comments: HOB elevated  Transfers Overall transfer level: Modified independent               General transfer comment: Reliant on momentum to power up into standing with RW  Ambulation/Gait Ambulation/Gait assistance: Modified independent (Device/Increase time) Gait Distance (Feet): 250 Feet Assistive device: Rolling walker (2 wheeled) Gait Pattern/deviations: Step-through pattern;Decreased stride length Gait velocity:  Decreased Gait velocity interpretation: 1.31 - 2.62 ft/sec, indicative of limited community ambulator General Gait Details: Steady ambulation mod indep with RW; 1x standing rest break secondary to c/o BLE fatigue. DOE 2/4, SpO2 94-96% on RA  Stairs            Wheelchair Mobility    Modified Rankin (Stroke Patients Only)       Balance Overall balance assessment: Needs assistance   Sitting balance-Leahy Scale: Fair       Standing balance-Leahy Scale: Fair                               Pertinent Vitals/Pain Pain Assessment: No/denies pain    Home Living Family/patient expects to be discharged to:: Private residence Living Arrangements: Alone Available Help at Discharge: Family;Available 24 hours/day Type of Home: House Home Access: Ramped entrance     Home Layout: One level Home Equipment: Walker - 2 wheels;Crutches      Prior Function Level of Independence: Independent with assistive device(s)         Comments: Uses crutches or walker when bilateral knees hurting     Hand Dominance        Extremity/Trunk Assessment   Upper Extremity Assessment Upper Extremity Assessment: Overall WFL for tasks assessed    Lower Extremity Assessment Lower Extremity Assessment: Overall WFL for tasks assessed       Communication   Communication: No difficulties  Cognition Arousal/Alertness: Awake/alert Behavior During Therapy: WFL for tasks assessed/performed Overall Cognitive Status: Within Functional Limits for tasks assessed  General Comments General comments (skin integrity, edema, etc.): Sister present    Exercises     Assessment/Plan    PT Assessment Patent does not need any further PT services  PT Problem List         PT Treatment Interventions      PT Goals (Current goals can be found in the Care Plan section)  Acute Rehab PT Goals PT Goal Formulation: All assessment and  education complete, DC therapy    Frequency     Barriers to discharge        Co-evaluation               AM-PAC PT "6 Clicks" Mobility  Outcome Measure Help needed turning from your back to your side while in a flat bed without using bedrails?: None Help needed moving from lying on your back to sitting on the side of a flat bed without using bedrails?: None Help needed moving to and from a bed to a chair (including a wheelchair)?: None Help needed standing up from a chair using your arms (e.g., wheelchair or bedside chair)?: None Help needed to walk in hospital room?: None Help needed climbing 3-5 steps with a railing? : A Little 6 Click Score: 23    End of Session   Activity Tolerance: Patient tolerated treatment well Patient left: in bed;with call bell/phone within reach;with family/visitor present Nurse Communication: Mobility status PT Visit Diagnosis: Other abnormalities of gait and mobility (R26.89)    Time: 6629-4765 PT Time Calculation (min) (ACUTE ONLY): 11 min   Charges:   PT Evaluation $PT Eval Moderate Complexity: [place], PT, DPT Acute Rehabilitation Services  Pager [phone] Office [place] 06/09/2018, 12:00 PM

## 2018-06-09 NOTE — Plan of Care (Signed)
  Problem: Activity: Goal: Capacity to carry out activities will improve Outcome: Progressing   Problem: Clinical Measurements: Goal: Will remain free from infection Outcome: Progressing   Problem: Activity: Goal: Risk for activity intolerance will decrease Outcome: Progressing   Problem: Coping: Goal: Level of anxiety will decrease Outcome: Completed/Met

## 2018-06-09 NOTE — Progress Notes (Signed)
Subjective:  1300 of UOP crt down a little - no new c/o's  Objective Vital signs in last 24 hours: Vitals:   06/07/18 1925 06/08/18 0453 06/08/18 1942 06/09/18 0554  BP: 121/72 126/73 119/62 136/79  Pulse: 86 85 86 87  Resp: 20 20  20   Temp: 97.8 F (36.6 C) 98.1 F (36.7 C) 97.8 F (36.6 C) 98.1 F (36.7 C)  TempSrc: Oral Oral Oral Oral  SpO2: 97% 99% 98% 98%  Weight:  122.4 kg  122.7 kg  Height:       Weight change: 0.272 kg  Intake/Output Summary (Last 24 hours) at 06/09/2018 1044 Last data filed at 06/09/2018 1017 Gross per 24 hour  Intake 1473 ml  Output 1300 ml  Net 173 ml    Assessment/Plan: 37 year old black female with biopsy-proven diabetic glomerulosclerosis with nephrotic syndrome who has had rocky course of late with episodes of AKI and volume overload 1.Renal-patient appears to have severe diabetic glomerulosclerosis.  As a result she has had fairly rapidly worsening renal function.  I suspect her rising creatinine with diuresis is just an unmasking of what her true GFR is and not necessarily worsening of it-in any event creatinine has been fairly stable albeit poor.  There are no indications for dialysis.  I will continue to follow her closely 2. Hypertension/volume  -she appears volume overloaded.  I  resumed her diuretics at torsemide 40- because her blood pressure is good I stopped her hydralazine as that can also cause edema 3.  Osteomyelitis-new diagnosis. Now ortho thinking charcot joint vs gout- investigation ongoing.  Be careful with the vancomycin levels-also on Maxipime 4. Anemia  -this is not helping her third spacing situation.   iron stores low, will replete and have started an ESA 5.  Bones-check PTH.  I see that she is already on calcitriol likely from her nephrologist in [place] 6.  Hyperkalemia-had been on supplementation in the past.  Status post 1 dose of Kayexalate 12/16 with an improved level.  I suspect it will go down with diuresis 7.  Metabolic acidosis- will give bicarb   Louis Meckel    Labs: Basic Metabolic Panel: Recent Labs  Lab 06/07/18 0419 06/08/18 0423 06/08/18 1429 06/09/18 0514  NA 138 137  --  138  K 5.1 5.4* 4.9 4.7  CL 109 110  --  112*  CO2 18* 17*  --  15*  GLUCOSE 70 132*  --  104*  BUN 35* 35*  --  31*  CREATININE 3.79* 3.98*  --  3.83*  CALCIUM 8.1* 8.0*  --  8.1*  PHOS  --   --   --  6.2*   Liver Function Tests: Recent Labs  Lab 06/05/18 2335  AST 14*  ALT 11  ALKPHOS 72  BILITOT 0.6  PROT 6.2*  ALBUMIN 2.1*   No results for input(s): LIPASE, AMYLASE in the last 168 hours. No results for input(s): AMMONIA in the last 168 hours. CBC: Recent Labs  Lab 06/05/18 2335 06/08/18 0423 06/09/18 1003  WBC 5.7 5.0 4.5  NEUTROABS 3.4 2.6 2.0  HGB 8.4* 7.8* 7.9*  HCT 27.2* 26.1* 26.2*  MCV 91.0 91.6 91.0  PLT 308 343 325   Cardiac Enzymes: No results for input(s): CKTOTAL, CKMB, CKMBINDEX, TROPONINI in the last 168 hours. CBG: Recent Labs  Lab 06/08/18 0722 06/08/18 1225 06/08/18 1622 06/08/18 2110 06/09/18 0752  GLUCAP 186* 105* 111* 154* 90    Iron Studies:  Recent Labs  06/09/18 0514  IRON 42  TIBC 204*  FERRITIN 73   Studies/Results: Mr Foot Right Wo Contrast  Result Date: 06/08/2018 CLINICAL DATA:  Osteomyelitis. EXAM: MRI OF THE RIGHT FOREFOOT WITHOUT CONTRAST TECHNIQUE: Multiplanar, multisequence MR imaging of the right forefoot was performed. No intravenous contrast was administered due to impaired renal function. COMPARISON:  Radiographs dated 04/07/2018 FINDINGS: Bones/Joint/Cartilage There is abnormal signal and bone destruction and cortical thickening and irregularity of the bases of second, third, fourth, and fifth metatarsals. Is abnormal signal and bone destruction involving the cuboid and lateral cuneiform. There is fragmentation of the base of the fifth metatarsal. There has focal erosion of the base of the first metatarsal at the  insertion of the peroneus longus tendon. There is an effusion at the IP joint of the great toe without bone destruction. There are small ankle joint and subtalar joint effusions as well as a small effusion at the talonavicular joint. Muscles and Tendons No acute abnormality. Soft tissues There is a focal subcutaneous fluid collection adjacent to the plantar aspect of the fifth MTP joint measuring 22 x 18 x 11 mm which may represent an abscess. There is prominent dorsal subcutaneous edema, nonspecific. IMPRESSION: 1. 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 2. Possible soft tissue abscess adjacent to the plantar aspect of the fifth MTP joint. 3. Subcutaneous edema of the forefoot, nonspecific but this could represent cellulitis. 4. Multiple joint effusions, nonspecific. Electronically Signed   By: Lorriane Shire M.D.   On: 06/08/2018 11:40   Dg Foot Complete Right  Result Date: 06/07/2018 CLINICAL DATA:  Diabetic patient with a wound on the lateral aspect of the right foot. EXAM: RIGHT FOOT COMPLETE - 3+ VIEW COMPARISON:  None. FINDINGS: Bony destructive change and periosteal reaction are seen in approximately the proximal 7 cm of the fifth metatarsal, almost entire fourth metatarsal and base of the third metatarsal. Bony destructive change is also seen in the cuboid and lateral cuneiform. Focal soft tissue prominence is seen adjacent to the fifth MTP joint which could be an abscess or blister. Soft tissues of the foot are markedly swollen. Flattening of the head of the second metatarsal is likely due to remote microfracture. IMPRESSION: Findings consistent with osteomyelitis in the third through fifth metatarsals, lateral cuneiform and cuboid. Diffuse soft tissue swelling about the foot. More focal swelling adjacent to the fifth MTP joint could be an abscess or blister. Electronically Signed   By: Inge Rise M.D.    On: 06/07/2018 15:11   Medications: Infusions: . sodium chloride Stopped (06/08/18 0826)  . ceFEPime (MAXIPIME) IV 1 g (06/08/18 1741)  . vancomycin      Scheduled Medications: . calcitRIOL  0.25 mcg Oral Once per day on Mon Wed Fri  . carvedilol  12.5 mg Oral BID WC  . darbepoetin (ARANESP) injection - NON-DIALYSIS  200 mcg Subcutaneous Q Tue-1800  . heparin  5,000 Units Subcutaneous Q8H  . insulin aspart  0-5 Units Subcutaneous QHS  . insulin aspart  0-9 Units Subcutaneous TID WC  . rosuvastatin  40 mg Oral QHS  . senna-docusate  1 tablet Oral BID  . sodium chloride flush  3 mL Intravenous Q12H  . torsemide  40 mg Oral Daily    have reviewed scheduled and prn medications.  Physical Exam: General: NAD- obese and limited mobility  Heart: RRR Lungs: dec BS at bases Abdomen: obese, soft, non tender Extremities:  pitting edema    06/09/2018,10:44 AM  LOS: 4 days

## 2018-06-09 NOTE — Progress Notes (Signed)
ABI's have been completed. Preliminary results can be found in CV Proc through chart review.   06/09/18 3:21 PM Carla Compton RVT

## 2018-06-09 NOTE — Consult Note (Signed)
ORTHOPAEDIC CONSULTATION  REQUESTING PHYSICIAN: Kayleen Memos, DO  Chief Complaint: Acute blister right foot fifth metatarsal head.  HPI: Carla Compton is a 37 y.o. 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  Past Medical History:  Diagnosis Date  . Benign essential HTN   . CKD (chronic kidney disease) stage 3, GFR 30-59 ml/min (HCC)   . Diabetes ([place])    History reviewed. No pertinent surgical history. Social History   Socioeconomic History  . Marital status: Single    Spouse name: Not on file  . Number of children: Not on file  . Years of education: Not on file  . Highest education level: Not on file  Occupational History  . Not on file  Social Needs  . Financial resource strain: Not on file  . Food insecurity:    Worry: Not on file    Inability: Not on file  . Transportation needs:    Medical: Not on file    Non-medical: Not on file  Tobacco Use  . Smoking status: Never Smoker  . Smokeless tobacco: Never Used  Substance and Sexual Activity  . Alcohol use: Never    Frequency: Never  . Drug use: Never  . Sexual activity: Not on file  Lifestyle  . Physical activity:    Days per week: Not on file    Minutes per session: Not on file  . Stress: Not on file  Relationships  . Social connections:    Talks on phone: Not on file    Gets together: Not on file    Attends religious service: Not on file    Active member of club or organization: Not on file    Attends meetings of clubs or organizations: Not on file    Relationship status: Not on file  Other Topics Concern  . Not on file  Social History Narrative  . Not on file   Family History  Problem Relation Age of Onset  . Mitral valve prolapse Mother   . Hypertension Mother   . Hypertension Father   . Diabetes Father   . Diabetes Maternal  Grandmother    - negative except otherwise stated in the family history section Allergies  Allergen Reactions  . Contrast Media [Iodinated Diagnostic Agents]    Prior to Admission medications   Medication Sig Start Date End Date Taking? Authorizing Provider  acetaminophen (TYLENOL) 500 MG tablet Take 1,000 mg by mouth as needed for mild pain or headache.    [provider]  albuterol (PROAIR HFA) 108 (90 Base) MCG/ACT inhaler Inhale 2 puffs into the lungs every 4 (four) hours as needed for wheezing or shortness of breath.    [provider]  calcitRIOL (ROCALTROL) 0.25 MCG capsule Take 1 capsule (0.25 mcg total) by mouth 3 (three) times a week. 06/01/18 07/01/18  Donne Hazel, MD  carvedilol (COREG) 12.5 MG tablet Take 1 tablet (12.5 mg total) by mouth 2 (two) times daily with a meal. 05/30/18 06/29/18  Donne Hazel, MD  hydrALAZINE (APRESOLINE) 50 MG tablet Take 1 tablet (50 mg total) by mouth 3 (three) times daily. 05/30/18 06/29/18  Donne Hazel, MD  rosuvastatin (CRESTOR) 40 MG tablet Take 40 mg by mouth at bedtime. 03/23/18   [provider]  torsemide (DEMADEX) 20 MG tablet Take 2 tablets (40 mg total) by mouth daily.  05/31/18 06/30/18  Donne Hazel, MD   Mr Foot Right Wo Contrast  Result Date: 06/08/2018 CLINICAL DATA:  Osteomyelitis. EXAM: MRI OF THE RIGHT FOREFOOT WITHOUT CONTRAST TECHNIQUE: Multiplanar, multisequence MR imaging of the right forefoot was performed. No intravenous contrast was administered due to impaired renal function. COMPARISON:  Radiographs dated 04/07/2018 FINDINGS: Bones/Joint/Cartilage There is abnormal signal and bone destruction and cortical thickening and irregularity of the bases of second, third, fourth, and fifth metatarsals. Is abnormal signal and bone destruction involving the cuboid and lateral cuneiform. There is fragmentation of the base of the fifth metatarsal. There has focal erosion of the base of the first metatarsal at the  insertion of the peroneus longus tendon. There is an effusion at the IP joint of the great toe without bone destruction. There are small ankle joint and subtalar joint effusions as well as a small effusion at the talonavicular joint. Muscles and Tendons No acute abnormality. Soft tissues There is a focal subcutaneous fluid collection adjacent to the plantar aspect of the fifth MTP joint measuring 22 x 18 x 11 mm which may represent an abscess. There is prominent dorsal subcutaneous edema, nonspecific. IMPRESSION: 1. 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 2. Possible soft tissue abscess adjacent to the plantar aspect of the fifth MTP joint. 3. Subcutaneous edema of the forefoot, nonspecific but this could represent cellulitis. 4. Multiple joint effusions, nonspecific. Electronically Signed   By: Lorriane Shire M.D.   On: 06/08/2018 11:40   Dg Foot Complete Right  Result Date: 06/07/2018 CLINICAL DATA:  Diabetic patient with a wound on the lateral aspect of the right foot. EXAM: RIGHT FOOT COMPLETE - 3+ VIEW COMPARISON:  None. FINDINGS: Bony destructive change and periosteal reaction are seen in approximately the proximal 7 cm of the fifth metatarsal, almost entire fourth metatarsal and base of the third metatarsal. Bony destructive change is also seen in the cuboid and lateral cuneiform. Focal soft tissue prominence is seen adjacent to the fifth MTP joint which could be an abscess or blister. Soft tissues of the foot are markedly swollen. Flattening of the head of the second metatarsal is likely due to remote microfracture. IMPRESSION: Findings consistent with osteomyelitis in the third through fifth metatarsals, lateral cuneiform and cuboid. Diffuse soft tissue swelling about the foot. More focal swelling adjacent to the fifth MTP joint could be an abscess or blister. Electronically Signed   By: Inge Rise M.D.    On: 06/07/2018 15:11   - pertinent xrays, CT, MRI studies were reviewed and independently interpreted  Positive ROS: All other systems have been reviewed and were otherwise negative with the exception of those mentioned in the HPI and as above.  Physical Exam: General: Alert, no acute distress Psychiatric: Patient is competent for consent with normal mood and affect Lymphatic: No axillary or cervical lymphadenopathy Cardiovascular: No pedal edema Respiratory: No cyanosis, no use of accessory musculature GI: No organomegaly, abdomen is soft and non-tender    Images:  @ENCIMAGES @  Labs:  Lab Results  Component Value Date   HGBA1C 7.2 (H) 05/26/2018   ESRSEDRATE 135 (H) 06/07/2018   CRP 14.8 (H) 06/07/2018    Lab Results  Component Value Date   ALBUMIN 2.1 (L) 06/05/2018   ALBUMIN 2.2 (L) 05/25/2018    Neurologic: Patient does not have protective sensation bilateral lower extremities.   MUSCULOSKELETAL:   Skin: Examination patient has swelling in both  lower extremities but no venous ulcers no ulcers or cellulitis of either foot.  There is no Charcot deformity.  Patient has a superficial blister over the fifth metatarsal head right foot most likely due to abrasion or her compression stockings.  Patient has a strong dorsalis pedis pulse bilaterally.  Hemoglobin A1c 7.2 albumin 2.1 with severe protein caloric malnutrition.  Review of the radiographs shows cystic changes through the midfoot of the right foot.  There is no Charcot collapse.  Review the MRI scan shows bony edema at this location.  Clinically this edema is not osteomyelitis.  This may be due to her rheumatoid arthritis possible early gout possible early Charcot changes.  Assessment: Assessment: Diabetic insensate neuropathy with severe protein caloric malnutrition with destructive bony changes through the midfoot.  Clinically this is not osteomyelitis this may be due to Charcot arthropathy may be due to her  rheumatoid arthritis or may be due to gout.  Plan: Plan: I will request a uric acid level I will follow-up in the office for routine follow-up.  No surgical intervention necessary for the fifth metatarsal head blister no surgical intervention needed for the midfoot bony changes.  Thank you for the consult and the opportunity to see Ms. Kevan Rosebush, [place] [phone] 6:32 AM

## 2018-06-09 NOTE — Progress Notes (Signed)
PROGRESS NOTE  Carla Compton TOI:[id number] DOB: December 24, 1980 DOA: 06/05/2018 PCP: No primary care provider on file.  HPI/Recap of past 24 hours: Carla Abdullahis a 37 y.o.femalewith medical history significant ofDM2, CKD stage 4, HTN. Patient was recently admitted to our service from Dec 1-7 with CHF exacerbation. 2d echo showed EF 83-38%, grade 2 diastolic dysfunction. Of concern creat was ranging between 3-3.98 during that admission. Patient was diuresed and discharged on torsemide 87m daily.  Patient came to ED at OSH for indigestion symptoms but was admitted for creat 4.34 (BNP 340 on admit).  Hospital course complicated by questionable right foot osteomyelitis incidentally found on right foot x-ray and on MRI.  No trauma or puncture wound.  Discussed with orthopedic surgery [name] suspects gout and will follow in the office post discharge within a week.  Started on colchicine 0.6 mg twice daily.  Allopurinol will be started outpatient in Dr. [name].   06/09/2018: Patient seen and examined with her sister at bedside.  No new complaints.  Questionable right foot osteomyelitis.  DC IV antibiotics.    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 (GFR 15-29) (HCC)   CKD (chronic kidney disease) stage 4, GFR 15-29 ml/min (HCC)   Diabetic polyneuropathy associated with type 2 diabetes mellitus (HCC)   Severe protein-calorie malnutrition (HCC)  Fluid overload suspect multifactorial secondary to worsening advanced renal failure versus acute on chronic combined diastolic and systolic CHF Resume torsemide per nephrology Continue monitor urine output Continue strict I's and O's Continue renal dialysis diet Nephrology consulted and will see the patient  Hyperphosphatemia PTH ordered by nephrology Management by  nephrology  Non-anion gap metabolic acidosis suspect secondary to renal failure Bicarb 15 and creatinine 3.83 with a uric acid of 8.7 Isotonic bicarb started by nephrology Repeat chemistry panel in the morning  Questionable right foot osteomyelitis X-ray right foot revealed possible osteomyelitis involving third through fifth metatarsals lateral cuneiform and cuboid confirmed by MRI CRP 14 and ESR 135  DC IV vancomycin and IV cefepime empirically  Follow-up with Dr. DSharol Givenoutpatient within a week  Hyperkalemia, resolving Suspect secondary to advanced renal failure Treated with IV calcium gluconate 1 g once and 30 g of p.o. Kayexalate once  Left fifth toe wound, present admission Wound care specialist consulted.  Appreciate recommendations  Type 2 diabetes with hyperglycemia Last A1c 7.2 on 05/26/2018 Continue insulin sliding scale  Diabetes polyneuropathy Obtain ABI  AKI on CKD 4 Baseline appears to be 3.0 with GFR of 22 Creatinine improving 3.83 from 3.98 from 3.79 from 3.69 1300 cc urine output recorded in last 24 hours Continue to avoid nephrotoxic agent Torsemide resumed by nephrology Repeat BMP in the morning  Acute on chronic combined diastolic and systolic CHF Last 2D echo done on 05/25/2018 revealed LVEF 45 to 50% with diffuse hypokinesis and grade 2 diastolic dysfunction BNP greater than 300 Continue strict I's and O's and daily weight Continue cardiac medications  Anemia of chronic disease Hemoglobin 7.9 from 7.8 from 8.4 No sign of overt bleeding Aranesp and ferric gluconate per nephrology Repeat CBC in the morning  Severe morbid obesity BMI 49 Recommend weight loss outpatient Healthy dieting and regular exercise  Resolving intermittent right ear pain unclear etiology No drainage Pain management in place   DVT prophylaxis: Heparin [place] 3 times daily Code Status: Full Family Communication: No family in  room Disposition Plan:  Home when nephrology and  general surgery signed off Consults called:  Nephrology and general surgery    Objective: Vitals:   06/07/18 1925 06/08/18 0453 06/08/18 1942 06/09/18 0554  BP: 121/72 126/73 119/62 136/79  Pulse: 86 85 86 87  Resp: _0 Temp: 97.8 F (36.6 C) 98.1 F (36.7 C) 97.8 F (36.6 C) 98.1 F (36.7 C)  TempSrc: Oral Oral Oral Oral  SpO2: 97% 99% 98% 98%  Weight:  122.4 kg  122.7 kg  Height:        Intake/Output Summary (Last 24 hours) at 06/09/2018 1112 Last data filed at 06/09/2018 1017 Gross per 24 hour  Intake 1473 ml  Output 1300 ml  Net 173 ml   Filed Weights   06/07/18 0516 06/08/18 0453 06/09/18 0554  Weight: 122.1 kg 122.4 kg 122.7 kg    Exam:  . General: 37 y.o. year-old female well-developed well-nourished no acute distress.  Alert and oriented x3.   . Cardiovascular: Regular rate and rhythm with no rubs or gallops.  No JVD or thyromegaly noted.   Marland Kitchen Respiratory: Clear to auscultation with no wheezes or rales.  Good inspiratory effort. . Abdomen: Soft nontender nondistended with normal bowel sounds x4 quadrants. . Musculoskeletal: 1+ pitting edema in lower extremities. . Skin: Right lateral foot blister with swelling.  Left fifth toe wound. Marland Kitchen Psychiatry: Mood is appropriate for condition and setting   Data Reviewed: CBC: Recent Labs  Lab 06/05/18 2335 06/08/18 0423 06/09/18 1003  WBC 5.7 5.0 4.5  NEUTROABS 3.4 2.6 2.0  HGB 8.4* 7.8* 7.9*  HCT 27.2* 26.1* 26.2*  MCV 91.0 91.6 91.0  PLT 308 343 607   Basic Metabolic Panel: Recent Labs  Lab 06/05/18 2335 06/06/18 0624 06/07/18 0419 06/08/18 0423 06/08/18 1429 06/09/18 0514  NA 138 139 138 137  --  138  K 5.0 4.9 5.1 5.4* 4.9 4.7  CL 111 111 109 110  --  112*  CO2 17* 21* 18* 17*  --  15*  GLUCOSE 98 92 70 132*  --  104*  BUN 36* 36* 35* 35*  --  31*  CREATININE 3.77* 3.69* 3.79* 3.98*  --  3.83*  CALCIUM 8.2* 8.2* 8.1* 8.0*  --  8.1*  PHOS  --   --   --   --   --  6.2*    GFR: Estimated Creatinine Clearance: 25.6 mL/min (A) (by C-G formula based on SCr of 3.83 mg/dL (H)). Liver Function Tests: Recent Labs  Lab 06/05/18 2335  AST 14*  ALT 11  ALKPHOS 72  BILITOT 0.6  PROT 6.2*  ALBUMIN 2.1*   No results for input(s): LIPASE, AMYLASE in the last 168 hours. No results for input(s): AMMONIA in the last 168 hours. Coagulation Profile: No results for input(s): INR, PROTIME in the last 168 hours. Cardiac Enzymes: No results for input(s): CKTOTAL, CKMB, CKMBINDEX, TROPONINI in the last 168 hours. BNP (last 3 results) No results for input(s): PROBNP in the last 8760 hours. HbA1C: No results for input(s): HGBA1C in the last 72 hours. CBG: Recent Labs  Lab 06/08/18 0722 06/08/18 1225 06/08/18 1622 06/08/18 2110 06/09/18 0752  GLUCAP 186* 105* 111* 154* 90   Lipid Profile: No results for input(s): CHOL, HDL, LDLCALC, TRIG, CHOLHDL, LDLDIRECT in the last 72 hours. Thyroid Function Tests: No results for input(s): TSH, T4TOTAL, FREET4, T3FREE, THYROIDAB in the last 72 hours. Anemia Panel: Recent Labs    06/09/18 0514  FERRITIN 73  TIBC 204*  IRON 42   Urine analysis: No results found for: COLORURINE, APPEARANCEUR, LABSPEC, PHURINE, GLUCOSEU, HGBUR, BILIRUBINUR, KETONESUR, PROTEINUR, UROBILINOGEN, NITRITE, LEUKOCYTESUR Sepsis Labs: @LABRCNTIP(procalcitonin:4,lacticidven:4)  )No results found for this or any previous visit (from the past 240 hour(s)).    Studies: Mr Foot Right Wo Contrast  Result Date: 06/08/2018 CLINICAL DATA:  Osteomyelitis. EXAM: MRI OF THE RIGHT FOREFOOT WITHOUT CONTRAST TECHNIQUE: Multiplanar, multisequence MR imaging of the right forefoot was performed. No intravenous contrast was administered due to impaired renal function. COMPARISON:  Radiographs dated 04/07/2018 FINDINGS: Bones/Joint/Cartilage There is abnormal signal and bone destruction and cortical thickening and irregularity of the bases of second, third,  fourth, and fifth metatarsals. Is abnormal signal and bone destruction involving the cuboid and lateral cuneiform. There is fragmentation of the base of the fifth metatarsal. There has focal erosion of the base of the first metatarsal at the insertion of the peroneus longus tendon. There is an effusion at the IP joint of the great toe without bone destruction. There are small ankle joint and subtalar joint effusions as well as a small effusion at the talonavicular joint. Muscles and Tendons No acute abnormality. Soft tissues There is a focal subcutaneous fluid collection adjacent to the plantar aspect of the fifth MTP joint measuring 22 x 18 x 11 mm which may represent an abscess. There is prominent dorsal subcutaneous edema, nonspecific. IMPRESSION: 1. 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 2. Possible soft tissue abscess adjacent to the plantar aspect of the fifth MTP joint. 3. Subcutaneous edema of the forefoot, nonspecific but this could represent cellulitis. 4. Multiple joint effusions, nonspecific. Electronically Signed   By: James  Maxwell M.D.   On: 06/08/2018 11:40    Scheduled Meds: . calcitRIOL  0.25 mcg Oral Once per day on Mon Wed Fri  . carvedilol  12.5 mg Oral BID WC  . colchicine  0.6 mg Oral BID  . darbepoetin (ARANESP) injection - NON-DIALYSIS  200 mcg Subcutaneous Q Tue-1800  . heparin  5,000 Units Subcutaneous Q8H  . insulin aspart  0-5 Units Subcutaneous QHS  . insulin aspart  0-9 Units Subcutaneous TID WC  . rosuvastatin  40 mg Oral QHS  . senna-docusate  1 tablet Oral BID  . sodium bicarbonate  650 mg Oral BID  . sodium chloride flush  3 mL Intravenous Q12H  . torsemide  40 mg Oral Daily    Continuous Infusions: . sodium chloride Stopped (06/08/18 0826)  . ferric gluconate (FERRLECIT/NULECIT) IV       LOS: 4 days      N , MD [facility] Pager  [phone]  If 7PM-7AM, please contact night-coverage [email or web address] Password [email or web address] 06/09/2018, 11:12 AM    

## 2018-06-10 DIAGNOSIS — N179 Acute kidney failure, unspecified: Secondary | ICD-10-CM

## 2018-06-10 LAB — GLUCOSE, CAPILLARY
GLUCOSE-CAPILLARY: 103 mg/dL — AB (ref 70–99)
Glucose-Capillary: 84 mg/dL (ref 70–99)
Glucose-Capillary: 106 mg/dL — ABNORMAL HIGH (ref 70–99)
Glucose-Capillary: 96 mg/dL (ref 70–99)

## 2018-06-10 LAB — CBC WITH DIFFERENTIAL/PLATELET
Abs Immature Granulocytes: 0.02 10*3/uL (ref 0.00–0.07)
Basophils Absolute: 0 10*3/uL (ref 0.0–0.1)
Basophils Relative: 1 %
Eosinophils Absolute: 0.4 10*3/uL (ref 0.0–0.5)
Eosinophils Relative: 9 %
HCT: 25.2 % — ABNORMAL LOW (ref 36.0–46.0)
Hemoglobin: 8.1 g/dL — ABNORMAL LOW (ref 12.0–15.0)
Immature Granulocytes: 1 %
LYMPHS ABS: 1.7 10*3/uL (ref 0.7–4.0)
Lymphocytes Relative: 39 %
MCH: 29.2 pg (ref 26.0–34.0)
MCHC: 32.1 g/dL (ref 30.0–36.0)
MCV: 91 fL (ref 80.0–100.0)
MONOS PCT: 8 %
Monocytes Absolute: 0.3 10*3/uL (ref 0.1–1.0)
Neutro Abs: 1.9 10*3/uL (ref 1.7–7.7)
Neutrophils Relative %: 42 %
Platelets: 334 10*3/uL (ref 150–400)
RBC: 2.77 MIL/uL — ABNORMAL LOW (ref 3.87–5.11)
RDW: 13.8 % (ref 11.5–15.5)
WBC: 4.4 10*3/uL (ref 4.0–10.5)
nRBC: 0 % (ref 0.0–0.2)

## 2018-06-10 LAB — BASIC METABOLIC PANEL
Anion gap: 8 (ref 5–15)
BUN: 29 mg/dL — ABNORMAL HIGH (ref 6–20)
CO2: 20 mmol/L — AB (ref 22–32)
CREATININE: 3.86 mg/dL — AB (ref 0.44–1.00)
Calcium: 8.3 mg/dL — ABNORMAL LOW (ref 8.9–10.3)
Chloride: 111 mmol/L (ref 98–111)
GFR calc Af Amer: 17 mL/min — ABNORMAL LOW (ref >60)
GFR calc non Af Amer: 14 mL/min — ABNORMAL LOW (ref >60)
Glucose, Bld: 81 mg/dL (ref 70–99)
Potassium: 4.6 mmol/L (ref 3.5–5.1)
Sodium: 139 mmol/L (ref 135–145)

## 2018-06-10 LAB — PARATHYROID HORMONE, INTACT (NO CA): PTH: 48 pg/mL (ref 15–65)

## 2018-06-10 LAB — C-REACTIVE PROTEIN: CRP: 5.2 mg/dL — ABNORMAL HIGH (ref <1.0)

## 2018-06-10 LAB — SEDIMENTATION RATE: Sed Rate: 138 mm/hr — ABNORMAL HIGH (ref 0–22)

## 2018-06-10 MED ORDER — TORSEMIDE 20 MG PO TABS
40.0000 mg | ORAL_TABLET | Freq: Once | ORAL | Status: AC
  Administered 2018-06-10: 40 mg via ORAL
  Filled 2018-06-10: qty 2

## 2018-06-10 MED ORDER — TORSEMIDE 20 MG PO TABS
80.0000 mg | ORAL_TABLET | Freq: Every day | ORAL | Status: DC
  Administered 2018-06-11 – 2018-06-14 (×4): 80 mg via ORAL
  Filled 2018-06-10 (×4): qty 4

## 2018-06-10 MED ORDER — CARVEDILOL 25 MG PO TABS
25.0000 mg | ORAL_TABLET | Freq: Two times a day (BID) | ORAL | Status: DC
  Administered 2018-06-11 – 2018-06-14 (×7): 25 mg via ORAL
  Filled 2018-06-10 (×7): qty 1

## 2018-06-10 NOTE — Progress Notes (Signed)
PROGRESS NOTE  Carla Compton YQM:[id number] DOB: [birth year] DOA: 06/05/2018 PCP: No primary care provider on file.  HPI/Recap of past 24 hours: Carla Abdullahis a 37 y.o.femalewith medical history significant ofDM2, CKD stage 4, HTN. Patient was recently admitted to our service from Dec 1-7 with CHF exacerbation. 2d echo showed EF 52-84%, grade 2 diastolic dysfunction. Of concern creat was ranging between 3-3.98 during that admission. Patient was diuresed and discharged on torsemide 59m daily.  Patient came to ED at OSH for indigestion symptoms but was admitted for creat 4.34 (BNP 340 on admit).  Hospital course complicated by questionable right foot osteomyelitis incidentally found on right foot x-ray and on MRI.  No trauma or puncture wound.  Discussed with orthopedic surgery [name] suspects gout and will follow in the office post discharge within a week.  Started on colchicine 0.6 mg twice daily.  Allopurinol will be started outpatient in Dr. [name].   06/09/2018: Patient seen and examined with her sister at bedside.  No new complaints.  Questionable right foot osteomyelitis.  DC IV antibiotics.    06/10/2018: Patient seen and examined with her sister at her bedside.  No new complaints.  No acute events overnight.  Nephrology following and diuresing due to fluid overload.   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 (GFR 15-29) (HCC)   CKD (chronic kidney disease) stage 4, GFR 15-29 ml/min (HCC)   Diabetic polyneuropathy associated with type 2 diabetes mellitus (HCC)   Severe protein-calorie malnutrition (HCC)  Fluid overload suspect multifactorial secondary to worsening advanced renal failure versus acute on chronic combined diastolic and systolic CHF Resume torsemide per nephrology Continue monitor  urine output Continue strict I's and O's Continue renal dialysis diet Torsemide increased to 80 mg daily per nephrology  Probable complex cyst in the periphery of the left mid kidney measuring 2.4 cm CT abdomen pelvis without contrast ordered to further assess  Hyperphosphatemia PTH 45 Management by nephrology  Non-anion gap metabolic acidosis suspect secondary to renal failure Bicarb 15 and creatinine 3.83 with a uric acid of 8.7 Isotonic bicarb started by nephrology Repeat chemistry panel in the morning  Questionable right foot osteomyelitis X-ray right foot revealed possible osteomyelitis involving third through fifth metatarsals lateral cuneiform and cuboid confirmed by MRI CRP 14 and ESR 135  DC IV vancomycin and IV cefepime empirically  Follow-up with Dr. DSharol Givenoutpatient within a week ABI negative  History of rheumatoid arthritis Self-reports receive injection every 8 weeks for rheumatoid arthritis Follow-up with rheumatology outpatient  Hyperkalemia, resolving Suspect secondary to advanced renal failure Treated with IV calcium gluconate 1 g once and 30 g of p.o. Kayexalate once  Left fifth toe wound, present admission Wound care specialist consulted.  Appreciate recommendations  Type 2 diabetes with hyperglycemia Last A1c 7.2 on 05/26/2018 Continue insulin sliding scale  Diabetes polyneuropathy ABI negative  AKI on CKD 4 Baseline appears to be 3.0 with GFR of 22 Creatinine today 3.86 1700 cc urine output recorded in last 24 hours Continue to avoid nephrotoxic agent Torsemide increased to 80 mg daily Continue to monitor urine output and electrolytes  Acute on chronic combined diastolic and systolic CHF Last 2D echo done on 05/25/2018 revealed LVEF 45 to 50% with diffuse hypokinesis and grade 2 diastolic dysfunction BNP greater than 300 Continue strict I's and O's and daily weight Continue cardiac medications  Anemia of chronic disease Hemoglobin improving  8.1 from 7.9 from 7.8  No sign of overt bleeding Aranesp and ferric gluconate per nephrology Repeat CBC in the morning  Severe morbid obesity BMI 49 Recommend weight loss outpatient Healthy dieting and regular exercise  Resolving intermittent right ear pain unclear etiology No drainage Pain management in place   DVT prophylaxis: Heparin [place] 3 times daily Code Status: Full Family Communication:  Sister in the room.  All questions answered to her satisfaction. Disposition Plan:  Home when nephrology and general surgery signed off Consults called:  Nephrology and general surgery    Objective: Vitals:   06/09/18 1931 06/10/18 0119 06/10/18 0533 06/10/18 1449  BP: (!) 168/98  (!) 145/90 (!) 143/84  Pulse: 84  85 83  Resp: 20   20  Temp: (!) 97.1 F (36.2 C)  98.2 F (36.8 C) 97.7 F (36.5 C)  TempSrc: Oral  Oral Oral  SpO2: 98%  99% 100%  Weight:  121.4 kg    Height:        Intake/Output Summary (Last 24 hours) at 06/10/2018 1513 Last data filed at 06/10/2018 1032 Gross per 24 hour  Intake 1900 ml  Output 1700 ml  Net 200 ml   Filed Weights   06/08/18 0453 06/09/18 0554 06/10/18 0119  Weight: 122.4 kg 122.7 kg 121.4 kg    Exam:  . General: 37 y.o. year-old female developed well-nourished in no acute distress.  Alert and oriented x3.   . Cardiovascular: Regular rate and rhythm with no rubs or gallops.  No JVD or thyromegaly noted. Marland Kitchen Respiratory: Clear to auscultation with no wheezes or rales.  Good inspiratory effort.. . Abdomen: Soft nontender nondistended with normal bowel sounds x4 quadrants. . Musculoskeletal: 1+ pitting edema in lower extremities. . Skin: Right lateral foot blister with swelling.  Left fifth toe wound. Marland Kitchen Psychiatry: Mood is appropriate for condition and setting   Data Reviewed: CBC: Recent Labs  Lab 06/05/18 2335 06/08/18 0423 06/09/18 1003 06/10/18 0502  WBC 5.7 5.0 4.5 4.4  NEUTROABS 3.4 2.6 2.0 1.9  HGB 8.4* 7.8* 7.9* 8.1*    HCT 27.2* 26.1* 26.2* 25.2*  MCV 91.0 91.6 91.0 91.0  PLT 308 343 325 166   Basic Metabolic Panel: Recent Labs  Lab 06/06/18 0624 06/07/18 0419 06/08/18 0423 06/08/18 1429 06/09/18 0514 06/10/18 0502  NA 139 138 137  --  138 139  K 4.9 5.1 5.4* 4.9 4.7 4.6  CL 111 109 110  --  112* 111  CO2 21* 18* 17*  --  15* 20*  GLUCOSE 92 70 132*  --  104* 81  BUN 36* 35* 35*  --  31* 29*  CREATININE 3.69* 3.79* 3.98*  --  3.83* 3.86*  CALCIUM 8.2* 8.1* 8.0*  --  8.1* 8.3*  PHOS  --   --   --   --  6.2*  --    GFR: Estimated Creatinine Clearance: 25.2 mL/min (A) (by C-G formula based on SCr of 3.86 mg/dL (H)). Liver Function Tests: Recent Labs  Lab 06/05/18 2335  AST 14*  ALT 11  ALKPHOS 72  BILITOT 0.6  PROT 6.2*  ALBUMIN 2.1*   No results for input(s): LIPASE, AMYLASE in the last 168 hours. No results for input(s): AMMONIA in the last 168 hours. Coagulation Profile: No results for input(s): INR, PROTIME in the last 168 hours. Cardiac Enzymes: No results for input(s): CKTOTAL, CKMB, CKMBINDEX, TROPONINI in the last 168 hours. BNP (last 3  results) No results for input(s): PROBNP in the last 8760 hours. HbA1C: No results for input(s): HGBA1C in the last 72 hours. CBG: Recent Labs  Lab 06/09/18 1717 06/09/18 2106 06/09/18 2213 06/10/18 0734 06/10/18 1153  GLUCAP 149* 71 91 84 103*   Lipid Profile: No results for input(s): CHOL, HDL, LDLCALC, TRIG, CHOLHDL, LDLDIRECT in the last 72 hours. Thyroid Function Tests: No results for input(s): TSH, T4TOTAL, FREET4, T3FREE, THYROIDAB in the last 72 hours. Anemia Panel: Recent Labs    06/09/18 0514  FERRITIN 73  TIBC 204*  IRON 42   Urine analysis: No results found for: COLORURINE, APPEARANCEUR, LABSPEC, PHURINE, GLUCOSEU, HGBUR, BILIRUBINUR, KETONESUR, PROTEINUR, UROBILINOGEN, NITRITE, LEUKOCYTESUR Sepsis Labs: '@LABRCNTIP' (procalcitonin:4,lacticidven:4)  )No results found for this or any previous visit (from the  past 240 hour(s)).    Studies: US Renal  Result Date: 06/09/2018 CLINICAL DATA:  Acute renal insufficiency, chronic renal disease EXAM: RENAL / URINARY TRACT ULTRASOUND COMPLETE COMPARISON:  None. FINDINGS: Right Kidney: Renal measurements: 13.0 x 4.9 x 6.4 cm = volume: 213 mL. No hydronephrosis is seen. No solid or cystic renal mass is evident. The echogenicity of the renal parenchyma is slightly increased consistent with chronic renal medical disease. Left Kidney: Renal measurements: 12.2 x 6.2 x 6.1 cm = volume: 243 mL. No hydronephrosis is seen. The echogenicity of the renal parenchyma is increased. There is a hypoechoic structure in the periphery the left mid kidney of 2.4 cm probably representing complex cyst but difficult to assess on this study. Bladder: The urinary bladder is not well distended but is grossly unremarkable. IMPRESSION: 1. No hydronephrosis. 2. Somewhat echogenic renal parenchyma consistent with chronic renal medical disease. 3. Probable complex cyst in the periphery of the left mid kidney of 2.4 cm. If necessary consider CT or MRI to assess further. Electronically Signed   By: Ivar Drape M.D.   On: 06/09/2018 15:53   Vas [place] Burnard Bunting With/wo Tbi  Result Date: 06/09/2018 LOWER EXTREMITY DOPPLER STUDY Indications: Ulceration.  Performing Technologist: Carlos Levering Rvt  Examination Guidelines: A complete evaluation includes at minimum, Doppler waveform signals and systolic blood pressure reading at the level of bilateral brachial, anterior tibial, and posterior tibial arteries, when vessel segments are accessible. Bilateral testing is considered an integral part of a complete examination. Photoelectric Plethysmograph (PPG) waveforms and toe systolic pressure readings are included as required and additional duplex testing as needed. Limited examinations for reoccurring indications may be performed as noted.  ABI Findings: +---------+------------------+-----+---------+--------+ Right     Rt Pressure (mmHg)IndexWaveform Comment  +---------+------------------+-----+---------+--------+ Brachial 164                    triphasic         +---------+------------------+-----+---------+--------+ PTA      198               1.14 triphasic         +---------+------------------+-----+---------+--------+ DP       198               1.14 triphasic         +---------+------------------+-----+---------+--------+ Great Toe139               0.80                   +---------+------------------+-----+---------+--------+ +---------+------------------+-----+---------+-------+ Left     Lt Pressure (mmHg)IndexWaveform Comment +---------+------------------+-----+---------+-------+ Brachial 173  triphasic        +---------+------------------+-----+---------+-------+ PTA      199               1.15 triphasic        +---------+------------------+-----+---------+-------+ DP       200               1.16 triphasic        +---------+------------------+-----+---------+-------+ Great Toe139               0.80                  +---------+------------------+-----+---------+-------+ +-------+-----------+-----------+------------+------------+ ABI/TBIToday's ABIToday's TBIPrevious ABIPrevious TBI +-------+-----------+-----------+------------+------------+ Right  1.14       0.8                                 +-------+-----------+-----------+------------+------------+ Left   1.16       0.8                                 +-------+-----------+-----------+------------+------------+  Summary: Right: Resting right ankle-brachial index is within normal range. No evidence of significant right lower extremity arterial disease. The right toe-brachial index is normal. Left: Resting left ankle-brachial index is within normal range. No evidence of significant left lower extremity arterial disease. The left toe-brachial index is normal.  *See table(s) above  for measurements and observations.  Electronically signed by Deitra Mayo MD on 06/09/2018 at 4:32:04 PM.   Final     Scheduled Meds: . calcitRIOL  0.25 mcg Oral Once per day on Mon Wed Fri  . carvedilol  12.5 mg Oral BID WC  . colchicine  0.6 mg Oral BID  . darbepoetin (ARANESP) injection - NON-DIALYSIS  200 mcg Subcutaneous Q Tue-1800  . heparin  5,000 Units Subcutaneous Q8H  . insulin aspart  0-5 Units Subcutaneous QHS  . insulin aspart  0-9 Units Subcutaneous TID WC  . rosuvastatin  40 mg Oral QHS  . senna-docusate  1 tablet Oral BID  . sodium bicarbonate  650 mg Oral BID  . sodium chloride flush  3 mL Intravenous Q12H  . torsemide  40 mg Oral Once  . [START ON 06/11/2018] torsemide  80 mg Oral Daily    Continuous Infusions: . sodium chloride Stopped (06/08/18 0826)  . ferric gluconate (FERRLECIT/NULECIT) IV 250 mg (06/10/18 1417)     LOS: 5 days     Kayleen Memos, MD [facility] Pager [phone]  If 7PM-7AM, please contact night-coverage [email or web address] Password [email or web address] 06/10/2018, 3:13 PM

## 2018-06-10 NOTE — Progress Notes (Signed)
Subjective:  1700 of UOP crt stable - no new c/o's  Objective Vital signs in last 24 hours: Vitals:   06/09/18 1210 06/09/18 1931 06/10/18 0119 06/10/18 0533  BP: (!) 141/85 (!) 168/98  (!) 145/90  Pulse: 84 84  85  Resp: 18 20    Temp: 98.3 F (36.8 C) (!) 97.1 F (36.2 C)  98.2 F (36.8 C)  TempSrc: Oral Oral  Oral  SpO2: 98% 98%  99%  Weight:   121.4 kg   Height:       Weight change: -1.315 kg  Intake/Output Summary (Last 24 hours) at 06/10/2018 1050 Last data filed at 06/10/2018 1032 Gross per 24 hour  Intake 2140 ml  Output 1700 ml  Net 440 ml    Assessment/Plan: 37 year old black female with biopsy-proven diabetic glomerulosclerosis with nephrotic syndrome who has had rocky course of late with episodes of AKI and volume overload 1.Renal-patient appears to have severe diabetic glomerulosclerosis.  As a result she has had fairly rapidly worsening renal function.  I suspect her rising creatinine with diuresis is just an unmasking of what her true GFR is and not necessarily worsening of it-in any event creatinine has been fairly stable albeit poor.  There are no indications for dialysis.  I will continue to follow her closely 2. Hypertension/volume  -she appears volume overloaded.  I  resumed her diuretics at torsemide 40- because her blood pressure is good I stopped her hydralazine as that can also cause edema- now BP is up- weight is not that different and still edema- inc torsemide- she said was taking total of 80 a day as OP- was supposed to take 20 qid, doubt she could keep up with that dosing- will do 80 daily ( give another 40 today later) 3.  Osteomyelitis-new diagnosis but now in question. Now ortho thinking char[place] joint vs gout- investigation ongoing.  Be careful with the vancomycin levels-also on Maxipime 4. Anemia  -this is not helping her third spacing situation.   iron stores low, repleting and have started an ESA 5.  Bones-PTH 45.  I see that she is already on  calcitriol likely from her nephrologist in [place] 6.  Hyperkalemia-had been on supplementation in the past.  Status post 1 dose of Kayexalate 12/16 with an improved level.  I suspect it will go down with diuresis 7. Metabolic acidosis- giving bicarb   Carla Compton    Labs: Basic Metabolic Panel: Recent Labs  Lab 06/08/18 0423 06/08/18 1429 06/09/18 0514 06/10/18 0502  NA 137  --  138 139  K 5.4* 4.9 4.7 4.6  CL 110  --  112* 111  CO2 17*  --  15* 20*  GLUCOSE 132*  --  104* 81  BUN 35*  --  31* 29*  CREATININE 3.98*  --  3.83* 3.86*  CALCIUM 8.0*  --  8.1* 8.3*  PHOS  --   --  6.2*  --    Liver Function Tests: Recent Labs  Lab 06/05/18 2335  AST 14*  ALT 11  ALKPHOS 72  BILITOT 0.6  PROT 6.2*  ALBUMIN 2.1*   No results for input(s): LIPASE, AMYLASE in the last 168 hours. No results for input(s): AMMONIA in the last 168 hours. CBC: Recent Labs  Lab 06/05/18 2335 06/08/18 0423 06/09/18 1003 06/10/18 0502  WBC 5.7 5.0 4.5 4.4  NEUTROABS 3.4 2.6 2.0 1.9  HGB 8.4* 7.8* 7.9* 8.1*  HCT 27.2* 26.1* 26.2* 25.2*  MCV 91.0 91.6 91.0  91.0  PLT 308 343 325 334   Cardiac Enzymes: No results for input(s): CKTOTAL, CKMB, CKMBINDEX, TROPONINI in the last 168 hours. CBG: Recent Labs  Lab 06/09/18 1208 06/09/18 1717 06/09/18 2106 06/09/18 2213 06/10/18 0734  GLUCAP 90 149* 71 91 84    Iron Studies:  Recent Labs    06/09/18 0514  IRON 42  TIBC 204*  FERRITIN 73   Studies/Results: US Renal  Result Date: 06/09/2018 CLINICAL DATA:  Acute renal insufficiency, chronic renal disease EXAM: RENAL / URINARY TRACT ULTRASOUND COMPLETE COMPARISON:  None. FINDINGS: Right Kidney: Renal measurements: 13.0 x 4.9 x 6.4 cm = volume: 213 mL. No hydronephrosis is seen. No solid or cystic renal mass is evident. The echogenicity of the renal parenchyma is slightly increased consistent with chronic renal medical disease. Left Kidney: Renal measurements: 12.2 x 6.2 x 6.1  cm = volume: 243 mL. No hydronephrosis is seen. The echogenicity of the renal parenchyma is increased. There is a hypoechoic structure in the periphery the left mid kidney of 2.4 cm probably representing complex cyst but difficult to assess on this study. Bladder: The urinary bladder is not well distended but is grossly unremarkable. IMPRESSION: 1. No hydronephrosis. 2. Somewhat echogenic renal parenchyma consistent with chronic renal medical disease. 3. Probable complex cyst in the periphery of the left mid kidney of 2.4 cm. If necessary consider CT or MRI to assess further. Electronically Signed   By: Carla Compton M.D.   On: 06/09/2018 15:53   Mr Foot Right Wo Contrast  Result Date: 06/08/2018 CLINICAL DATA:  Osteomyelitis. EXAM: MRI OF THE RIGHT FOREFOOT WITHOUT CONTRAST TECHNIQUE: Multiplanar, multisequence MR imaging of the right forefoot was performed. No intravenous contrast was administered due to impaired renal function. COMPARISON:  Radiographs dated 04/07/2018 FINDINGS: Bones/Joint/Cartilage There is abnormal signal and bone destruction and cortical thickening and irregularity of the bases of second, third, fourth, and fifth metatarsals. Is abnormal signal and bone destruction involving the cuboid and lateral cuneiform. There is fragmentation of the base of the fifth metatarsal. There has focal erosion of the base of the first metatarsal at the insertion of the peroneus longus tendon. There is an effusion at the IP joint of the great toe without bone destruction. There are small ankle joint and subtalar joint effusions as well as a small effusion at the talonavicular joint. Muscles and Tendons No acute abnormality. Soft tissues There is a focal subcutaneous fluid collection adjacent to the plantar aspect of the fifth MTP joint measuring 22 x 18 x 11 mm which may represent an abscess. There is prominent dorsal subcutaneous edema, nonspecific. IMPRESSION: 1. Findings consistent with osteomyelitis of the  bases of the second through fifth metatarsals and of the lateral cuneiform and cuboid. 2. Possible soft tissue abscess adjacent to the plantar aspect of the fifth MTP joint. 3. Subcutaneous edema of the forefoot, nonspecific but this could represent cellulitis. 4. Multiple joint effusions, nonspecific. Electronically Signed   By: Carla Compton M.D.   On: 06/08/2018 11:40   Vas [place] Burnard Bunting With/wo Tbi  Result Date: 06/09/2018 LOWER EXTREMITY DOPPLER STUDY Indications: Ulceration.  Performing Technologist: Carla Compton Rvt  Examination Guidelines: A complete evaluation includes at minimum, Doppler waveform signals and systolic blood pressure reading at the level of bilateral brachial, anterior tibial, and posterior tibial arteries, when vessel segments are accessible. Bilateral testing is considered an integral part of a complete examination. Photoelectric Plethysmograph (PPG) waveforms and toe systolic pressure readings are included as required and additional  duplex testing as needed. Limited examinations for reoccurring indications may be performed as noted.  ABI Findings: +---------+------------------+-----+---------+--------+ Right    Rt Pressure (mmHg)IndexWaveform Comment  +---------+------------------+-----+---------+--------+ Brachial 164                    triphasic         +---------+------------------+-----+---------+--------+ PTA      198               1.14 triphasic         +---------+------------------+-----+---------+--------+ DP       198               1.14 triphasic         +---------+------------------+-----+---------+--------+ Great Toe139               0.80                   +---------+------------------+-----+---------+--------+ +---------+------------------+-----+---------+-------+ Left     Lt Pressure (mmHg)IndexWaveform Comment +---------+------------------+-----+---------+-------+ Brachial 173                    triphasic         +---------+------------------+-----+---------+-------+ PTA      199               1.15 triphasic        +---------+------------------+-----+---------+-------+ DP       200               1.16 triphasic        +---------+------------------+-----+---------+-------+ Great Toe139               0.80                  +---------+------------------+-----+---------+-------+ +-------+-----------+-----------+------------+------------+ ABI/TBIToday's ABIToday's TBIPrevious ABIPrevious TBI +-------+-----------+-----------+------------+------------+ Right  1.14       0.8                                 +-------+-----------+-----------+------------+------------+ Left   1.16       0.8                                 +-------+-----------+-----------+------------+------------+  Summary: Right: Resting right ankle-brachial index is within normal range. No evidence of significant right lower extremity arterial disease. The right toe-brachial index is normal. Left: Resting left ankle-brachial index is within normal range. No evidence of significant left lower extremity arterial disease. The left toe-brachial index is normal.  *See table(s) above for measurements and observations.  Electronically signed by Carla Mayo MD on 06/09/2018 at 4:32:04 PM.   Final    Medications: Infusions: . sodium chloride Stopped (06/08/18 0826)  . ferric gluconate (FERRLECIT/NULECIT) IV 250 mg (06/09/18 1659)    Scheduled Medications: . calcitRIOL  0.25 mcg Oral Once per day on Mon Wed Fri  . carvedilol  12.5 mg Oral BID WC  . colchicine  0.6 mg Oral BID  . darbepoetin (ARANESP) injection - NON-DIALYSIS  200 mcg Subcutaneous Q Tue-1800  . heparin  5,000 Units Subcutaneous Q8H  . insulin aspart  0-5 Units Subcutaneous QHS  . insulin aspart  0-9 Units Subcutaneous TID WC  . rosuvastatin  40 mg Oral QHS  . senna-docusate  1 tablet Oral BID  . sodium bicarbonate  650 mg Oral BID  . sodium chloride  flush  3 mL Intravenous Q12H  .  torsemide  40 mg Oral Daily    have reviewed scheduled and prn medications.  Physical Exam: General: NAD- obese and limited mobility  Heart: RRR Lungs: dec BS at bases Abdomen: obese, soft, non tender Extremities: pitting edema    06/10/2018,10:50 AM  LOS: 5 days

## 2018-06-10 NOTE — Plan of Care (Signed)
  Problem: Activity: Goal: Capacity to carry out activities will improve Outcome: Progressing   Problem: Activity: Goal: Risk for activity intolerance will decrease Outcome: Progressing  Pt. Ambulatory in hallway with walker.

## 2018-06-10 NOTE — Consult Note (Signed)
Cardiology Consultation:   Patient ID: [name] MRN: [id number]; DOB: [birth year]  Admit date: 06/05/2018 Date of Consult: 06/10/2018  Primary Care Provider: No primary care provider on file. Primary Cardiologist: Pixie Casino, MD  Primary Electrophysiologist:  None    Patient Profile:   Carla Compton is a 37 y.o. female with a hx of seropositive erosive rheumatoid arthritis, CKD, poorly controlled diabetes with diabetic neuropathy, history of diabetic myonecrosis right thigh 2017, hypertension, hyperlipidemia, GERD and obesity who is being seen today for the evaluation of CHF at the request of Dr. Nevada Crane.  Patient wants to follow-up with Dr. Oval Linsey in the office after discharge.  History of Present Illness:   Carla Compton  was recently admitted 12/1-12/7 for CHF exacerbation. BNP was 3518 with mildly elevated troponin 0.29.  Echocardiogram showed EF 45-50% with grade 2 diastolic dysfunction.  She was diuresed and serum creatinine ranged 3-3.98 during that admission.  She was discharged home on torsemide 40 mg daily.  Patient presented to outside hospital for indigestion symptoms and was admitted for creatinine of 4.34, BNP 340.  Torsemide was held and she was gently hydrated with IV fluids.  Creatinine trended down but she then appeared volume overloaded so IV fluids were stopped.  She was transferred to [facility] for further care on 06/05/2018.   Nephrology has been consulted.  Patient known to have biopsy-proven diabetic glomerulosclerosis with nephrotic syndrome with episodes of AKI and volume overload.  Diuretics have been resumed with torsemide 40 mg.  Hydralazine was stopped as it can cause edema. She was given Kayexalate for hyperkalemia with hopes that potassium will go down with diuresis. She was also given bicarb for metabolic acidosis.  She was also found to have a blister on her right foot with orthopedics consulted finding Charcot foot versus gout.    Upon my  evaluation the patient tells me that she has chronic lower extremity edema, now improved since admission.  She has bilateral knee pain due to rheumatoid arthritis.  Her knee pain and edema limit her mobility.  She has to use a crutch and finds that she gets very fatigued and short of breath with any kind of activity.  She works full-time in [employer], academic.  She denies any chest pain/pressure/tightness, orthopnea, PND, palpitations, lightheadedness or syncope.   She has history a few years back in [place] for heart failure.  She says she was hospitalized for a boil and was given IV fluids which then led to volume overload.  She says she had follow-up with cardiology as an outpatient and was told that everything was fine and she did not need to come back.  She denies any prior stress test or cardiac catheterization.  Past Medical History:  Diagnosis Date  . Benign essential HTN   . CKD (chronic kidney disease) stage 3, GFR 30-59 ml/min (HCC)   . Diabetes ([place])     History reviewed. No pertinent surgical history.   Home Medications:  Prior to Admission medications   Medication Sig Start Date End Date Taking? Authorizing Provider  acetaminophen (TYLENOL) 500 MG tablet Take 1,000 mg by mouth as needed for mild pain or headache.    [provider]  albuterol (PROAIR HFA) 108 (90 Base) MCG/ACT inhaler Inhale 2 puffs into the lungs every 4 (four) hours as needed for wheezing or shortness of breath.    [provider]  calcitRIOL (ROCALTROL) 0.25 MCG capsule Take 1 capsule (0.25 mcg total) by mouth 3 (three) times a week.  06/01/18 07/01/18  Donne Hazel, MD  carvedilol (COREG) 12.5 MG tablet Take 1 tablet (12.5 mg total) by mouth 2 (two) times daily with a meal. 05/30/18 06/29/18  Donne Hazel, MD  hydrALAZINE (APRESOLINE) 50 MG tablet Take 1 tablet (50 mg total) by mouth 3 (three) times daily. 05/30/18 06/29/18  Donne Hazel, MD  rosuvastatin (CRESTOR) 40 MG tablet Take 40 mg by  mouth at bedtime. 03/23/18   [provider]  torsemide (DEMADEX) 20 MG tablet Take 2 tablets (40 mg total) by mouth daily. 05/31/18 06/30/18  Donne Hazel, MD    Inpatient Medications: Scheduled Meds: . calcitRIOL  0.25 mcg Oral Once per day on Mon Wed Fri  . carvedilol  12.5 mg Oral BID WC  . colchicine  0.6 mg Oral BID  . darbepoetin (ARANESP) injection - NON-DIALYSIS  200 mcg Subcutaneous Q Tue-1800  . heparin  5,000 Units Subcutaneous Q8H  . insulin aspart  0-5 Units Subcutaneous QHS  . insulin aspart  0-9 Units Subcutaneous TID WC  . rosuvastatin  40 mg Oral QHS  . senna-docusate  1 tablet Oral BID  . sodium bicarbonate  650 mg Oral BID  . sodium chloride flush  3 mL Intravenous Q12H  . torsemide  40 mg Oral Once  . [START ON 06/11/2018] torsemide  80 mg Oral Daily   Continuous Infusions: . sodium chloride Stopped (06/08/18 0826)  . ferric gluconate (FERRLECIT/NULECIT) IV 250 mg (06/10/18 1417)   PRN Meds: sodium chloride, acetaminophen, albuterol, ondansetron (ZOFRAN) IV, oxyCODONE, sodium chloride flush  Allergies:    Allergies  Allergen Reactions  . Contrast Media [Iodinated Diagnostic Agents]     Social History:   Social History   Socioeconomic History  . Marital status: Single    Spouse name: Not on file  . Number of children: Not on file  . Years of education: Not on file  . Highest education level: Not on file  Occupational History  . Not on file  Social Needs  . Financial resource strain: Not on file  . Food insecurity:    Worry: Not on file    Inability: Not on file  . Transportation needs:    Medical: Not on file    Non-medical: Not on file  Tobacco Use  . Smoking status: Never Smoker  . Smokeless tobacco: Never Used  Substance and Sexual Activity  . Alcohol use: Never    Frequency: Never  . Drug use: Never  . Sexual activity: Not on file  Lifestyle  . Physical activity:    Days per week: Not on file    Minutes per session: Not on  file  . Stress: Not on file  Relationships  . Social connections:    Talks on phone: Not on file    Gets together: Not on file    Attends religious service: Not on file    Active member of club or organization: Not on file    Attends meetings of clubs or organizations: Not on file    Relationship status: Not on file  . Intimate partner violence:    Fear of current or ex partner: Not on file    Emotionally abused: Not on file    Physically abused: Not on file    Forced sexual activity: Not on file  Other Topics Concern  . Not on file  Social History Narrative  . Not on file    Family History:    Family History  Problem Relation  Age of Onset  . Mitral valve prolapse Mother   . Hypertension Mother   . Hypertension Father   . Diabetes Father   . Diabetes Maternal Grandmother      ROS:  Please see the history of present illness.   All other ROS reviewed and negative.     Physical Exam/Data:   Vitals:   06/09/18 1931 06/10/18 0119 06/10/18 0533 06/10/18 1449  BP: (!) 168/98  (!) 145/90 (!) 143/84  Pulse: 84  85 83  Resp: 20   20  Temp: (!) 97.1 F (36.2 C)  98.2 F (36.8 C) 97.7 F (36.5 C)  TempSrc: Oral  Oral Oral  SpO2: 98%  99% 100%  Weight:  121.4 kg    Height:        Intake/Output Summary (Last 24 hours) at 06/10/2018 1455 Last data filed at 06/10/2018 1032 Gross per 24 hour  Intake 2140 ml  Output 1700 ml  Net 440 ml   Filed Weights   06/08/18 0453 06/09/18 0554 06/10/18 0119  Weight: 122.4 kg 122.7 kg 121.4 kg   Body mass index is 48.94 kg/m.  General: Obese female, in no acute distress HEENT: normal Lymph: no adenopathy Neck: Difficult to assess JVD due to body habitus Vascular: No carotid bruits; FA pulses 2+ bilaterally without bruits  Cardiac:  normal S1, S2; RRR; no murmur  Lungs:  clear to auscultation bilaterally, no wheezing, rhonchi or rales  Abd: soft, nontender, no hepatomegaly  Ext: 1+ lower leg edema Musculoskeletal:  blister  on lateral aspect of right foot, BUE and BLE strength normal and equal, right toes pointing downward Skin: warm and dry  Neuro:  CNs 2-12 intact, no focal abnormalities noted Psych:  Normal affect   EKG:  The EKG was personally reviewed and demonstrates:  Normal sinus rhythm Telemetry:  Telemetry was personally reviewed and demonstrates: Sinus rhythm in the 80s  Relevant CV Studies:  Echocardiogram 05/25/2018 Study Conclusions - Left ventricle: The cavity size was mildly dilated. There was   mild concentric hypertrophy. Systolic function was mildly   reduced. The estimated ejection fraction was in the range of 45%   to 50%. Diffuse hypokinesis. Features are consistent with a   pseudonormal left ventricular filling pattern, with concomitant   abnormal relaxation and increased filling pressure (grade 2   diastolic dysfunction). - Left atrium: The atrium was mildly dilated.   Laboratory Data:  Chemistry Recent Labs  Lab 06/08/18 0423 06/08/18 1429 06/09/18 0514 06/10/18 0502  NA 137  --  138 139  K 5.4* 4.9 4.7 4.6  CL 110  --  112* 111  CO2 17*  --  15* 20*  GLUCOSE 132*  --  104* 81  BUN 35*  --  31* 29*  CREATININE 3.98*  --  3.83* 3.86*  CALCIUM 8.0*  --  8.1* 8.3*  GFRNONAA 14*  --  14* 14*  GFRAA 16*  --  17* 17*  ANIONGAP 10  --  11 8    Recent Labs  Lab 06/05/18 2335  PROT 6.2*  ALBUMIN 2.1*  AST 14*  ALT 11  ALKPHOS 72  BILITOT 0.6   Hematology Recent Labs  Lab 06/08/18 0423 06/09/18 1003 06/10/18 0502  WBC 5.0 4.5 4.4  RBC 2.85* 2.88* 2.77*  HGB 7.8* 7.9* 8.1*  HCT 26.1* 26.2* 25.2*  MCV 91.6 91.0 91.0  MCH 27.4 27.4 29.2  MCHC 29.9* 30.2 32.1  RDW 14.1 13.8 13.8  PLT 343 325 334  Cardiac EnzymesNo results for input(s): TROPONINI in the last 168 hours. No results for input(s): TROPIPOC in the last 168 hours.  BNP Recent Labs  Lab 06/05/18 2335  BNP 369.8*    DDimer No results for input(s): DDIMER in the last 168  hours.  Radiology/Studies:  US Renal  Result Date: June 11, 2018 CLINICAL DATA:  Acute renal insufficiency, chronic renal disease EXAM: RENAL / URINARY TRACT ULTRASOUND COMPLETE COMPARISON:  None. FINDINGS: Right Kidney: Renal measurements: 13.0 x 4.9 x 6.4 cm = volume: 213 mL. No hydronephrosis is seen. No solid or cystic renal mass is evident. The echogenicity of the renal parenchyma is slightly increased consistent with chronic renal medical disease. Left Kidney: Renal measurements: 12.2 x 6.2 x 6.1 cm = volume: 243 mL. No hydronephrosis is seen. The echogenicity of the renal parenchyma is increased. There is a hypoechoic structure in the periphery the left mid kidney of 2.4 cm probably representing complex cyst but difficult to assess on this study. Bladder: The urinary bladder is not well distended but is grossly unremarkable. IMPRESSION: 1. No hydronephrosis. 2. Somewhat echogenic renal parenchyma consistent with chronic renal medical disease. 3. Probable complex cyst in the periphery of the left mid kidney of 2.4 cm. If necessary consider CT or MRI to assess further. Electronically Signed   By: Ivar Drape M.D.   On: 11-Jun-2018 15:53   Mr Foot Right Wo Contrast  Result Date: 06/08/2018 CLINICAL DATA:  Osteomyelitis. EXAM: MRI OF THE RIGHT FOREFOOT WITHOUT CONTRAST TECHNIQUE: Multiplanar, multisequence MR imaging of the right forefoot was performed. No intravenous contrast was administered due to impaired renal function. COMPARISON:  Radiographs dated 04/07/2018 FINDINGS: Bones/Joint/Cartilage There is abnormal signal and bone destruction and cortical thickening and irregularity of the bases of second, third, fourth, and fifth metatarsals. Is abnormal signal and bone destruction involving the cuboid and lateral cuneiform. There is fragmentation of the base of the fifth metatarsal. There has focal erosion of the base of the first metatarsal at the insertion of the peroneus longus tendon. There is an  effusion at the IP joint of the great toe without bone destruction. There are small ankle joint and subtalar joint effusions as well as a small effusion at the talonavicular joint. Muscles and Tendons No acute abnormality. Soft tissues There is a focal subcutaneous fluid collection adjacent to the plantar aspect of the fifth MTP joint measuring 22 x 18 x 11 mm which may represent an abscess. There is prominent dorsal subcutaneous edema, nonspecific. IMPRESSION: 1. 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 2. Possible soft tissue abscess adjacent to the plantar aspect of the fifth MTP joint. 3. Subcutaneous edema of the forefoot, nonspecific but this could represent cellulitis. 4. Multiple joint effusions, nonspecific. Electronically Signed   By: Lorriane Shire M.D.   On: 06/08/2018 11:40   Dg Foot Complete Right  Result Date: 06/07/2018 CLINICAL DATA:  Diabetic patient with a wound on the lateral aspect of the right foot. EXAM: RIGHT FOOT COMPLETE - 3+ VIEW COMPARISON:  None. FINDINGS: Bony destructive change and periosteal reaction are seen in approximately the proximal 7 cm of the fifth metatarsal, almost entire fourth metatarsal and base of the third metatarsal. Bony destructive change is also seen in the cuboid and lateral cuneiform. Focal soft tissue prominence is seen adjacent to the fifth MTP joint which could be an abscess or blister. Soft tissues of the foot are markedly swollen. Flattening  of the head of the second metatarsal is likely due to remote microfracture. IMPRESSION: Findings consistent with osteomyelitis in the third through fifth metatarsals, lateral cuneiform and cuboid. Diffuse soft tissue swelling about the foot. More focal swelling adjacent to the fifth MTP joint could be an abscess or blister. Electronically Signed   By: Inge Rise M.D.   On: 06/07/2018 15:11   Vas [place] Burnard Bunting With/wo  Tbi  Result Date: 06/09/2018 LOWER EXTREMITY DOPPLER STUDY Indications: Ulceration.  Performing Technologist: Carlos Levering Rvt  Examination Guidelines: A complete evaluation includes at minimum, Doppler waveform signals and systolic blood pressure reading at the level of bilateral brachial, anterior tibial, and posterior tibial arteries, when vessel segments are accessible. Bilateral testing is considered an integral part of a complete examination. Photoelectric Plethysmograph (PPG) waveforms and toe systolic pressure readings are included as required and additional duplex testing as needed. Limited examinations for reoccurring indications may be performed as noted.  ABI Findings: +---------+------------------+-----+---------+--------+ Right    Rt Pressure (mmHg)IndexWaveform Comment  +---------+------------------+-----+---------+--------+ Brachial 164                    triphasic         +---------+------------------+-----+---------+--------+ PTA      198               1.14 triphasic         +---------+------------------+-----+---------+--------+ DP       198               1.14 triphasic         +---------+------------------+-----+---------+--------+ Great Toe139               0.80                   +---------+------------------+-----+---------+--------+ +---------+------------------+-----+---------+-------+ Left     Lt Pressure (mmHg)IndexWaveform Comment +---------+------------------+-----+---------+-------+ Brachial 173                    triphasic        +---------+------------------+-----+---------+-------+ PTA      199               1.15 triphasic        +---------+------------------+-----+---------+-------+ DP       200               1.16 triphasic        +---------+------------------+-----+---------+-------+ Great Toe139               0.80                  +---------+------------------+-----+---------+-------+  +-------+-----------+-----------+------------+------------+ ABI/TBIToday's ABIToday's TBIPrevious ABIPrevious TBI +-------+-----------+-----------+------------+------------+ Right  1.14       0.8                                 +-------+-----------+-----------+------------+------------+ Left   1.16       0.8                                 +-------+-----------+-----------+------------+------------+  Summary: Right: Resting right ankle-brachial index is within normal range. No evidence of significant right lower extremity arterial disease. The right toe-brachial index is normal. Left: Resting left ankle-brachial index is within normal range. No evidence of significant left lower extremity arterial disease. The left toe-brachial index is normal.  *See table(s) above for measurements and observations.  Electronically signed by Deitra Mayo MD on 06/09/2018 at 4:32:04 PM.   Final     Assessment and Plan:   1. Acute on chronic combined systolic and diastolic heart failure -Patient presented with indigestion symptoms and found to have creatinine of 4.34, BNP 340.  Initially given IV fluids for renal function and became volume overloaded.  She has a history of similar presentation a few years ago after being given IV fluids during hospitalization.  She denies any prior stress test or cardiac cath. -EF 45-50%, diffuse hypokinesis, grade 2 diastolic dysfunction.  No prior echo found in epic or care everywhere. -Patient has chronic lower extremity edema, denies orthopnea or PND.  On torsemide 40 mg daily at home.  She does have dyspnea on exertion and fatigue that she relates to knee pain limiting her activity. -Nephrology is on board for diuretic therapy in setting of worsening kidney disease.  Has been started on torsemide 40 mg p.o. daily with increase to 80 mg starting tomorrow. -Weight appears to be down about 2-1/2 pounds.  She had good urine output yesterday, 1.7 L.  Volume status is  +2.4 L since admission. -Continue current management and try to rechallenge patient with hydralazine for heart failure and blood pressure control once improved fluid status. -Can consider stress testing at a future date, as an outpatient.  2. 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 Patient known to have biopsy-proven diabetic glomerulosclerosis (01/2018) with nephrotic syndrome with episodes of AKI and volume overload.  -Nephrology is guiding diuretic therapy  3. Diabetes type 2 -A1c 7.2 on 05/26/2018.  Patient with known diabetic neuropathy.  Patient reports that her last A1c was around 9.  4. Right foot pain -Orthopedics has evaluated.  Not felt to be osteomyelitis.  Possible Charcot arthropathy versus gout.  Colchicine started.  5.   Hypertension -Has been managed with carvedilol 12.5 mg and hydralazine.  6.   Hyperlipidemia -On Crestor 40 mg daily.  LDL was 116 on 05/27/2018.  Patient needs better control of cholesterol.  Unclear if she was compliant with her statin at the time of blood draw.  7.  Morbid obesity -Body mass index is 48.94 kg/m. -Wt loss would help most of her health issues  For questions or updates, please contact [place] Please consult [email or web address] for contact info under     Signed, [name], NP  06/10/2018 2:55 PM

## 2018-06-11 ENCOUNTER — Inpatient Hospital Stay (HOSPITAL_COMMUNITY): Payer: BLUE CROSS/BLUE SHIELD

## 2018-06-11 DIAGNOSIS — I5043 Acute on chronic combined systolic (congestive) and diastolic (congestive) heart failure: Secondary | ICD-10-CM

## 2018-06-11 LAB — BASIC METABOLIC PANEL
Anion gap: 9 (ref 5–15)
BUN: 29 mg/dL — ABNORMAL HIGH (ref 6–20)
CO2: 20 mmol/L — ABNORMAL LOW (ref 22–32)
Calcium: 8.3 mg/dL — ABNORMAL LOW (ref 8.9–10.3)
Chloride: 111 mmol/L (ref 98–111)
Creatinine, Ser: 3.58 mg/dL — ABNORMAL HIGH (ref 0.44–1.00)
GFR calc Af Amer: 18 mL/min — ABNORMAL LOW (ref >60)
GFR, EST NON AFRICAN AMERICAN: 16 mL/min — AB (ref >60)
Glucose, Bld: 89 mg/dL (ref 70–99)
Potassium: 4.4 mmol/L (ref 3.5–5.1)
Sodium: 140 mmol/L (ref 135–145)

## 2018-06-11 LAB — RNA QUALITATIVE: HIV 1 RNA Qualitative: 1

## 2018-06-11 LAB — HIV 1/2 AB DIFFERENTIATION
HIV 1 AB: NEGATIVE
HIV 2 Ab: NEGATIVE
Note: NEGATIVE

## 2018-06-11 LAB — GLUCOSE, CAPILLARY
Glucose-Capillary: 94 mg/dL (ref 70–99)
Glucose-Capillary: 86 mg/dL (ref 70–99)
Glucose-Capillary: 98 mg/dL (ref 70–99)
Glucose-Capillary: 96 mg/dL (ref 70–99)

## 2018-06-11 LAB — HIV ANTIBODY (ROUTINE TESTING W REFLEX): HIV Screen 4th Generation wRfx: REACTIVE — AB

## 2018-06-11 MED ORDER — TECHNETIUM TC 99M TETROFOSMIN IV KIT
30.0000 | PACK | Freq: Once | INTRAVENOUS | Status: AC | PRN
  Administered 2018-06-11: 30 via INTRAVENOUS

## 2018-06-11 NOTE — Progress Notes (Signed)
Progress Note  Patient Name: Carla Compton Date of Encounter: 06/11/2018  Primary Cardiologist: Pixie Casino, MD   Subjective   Feeling better.  Legs feel weak, like they will give out when she walks.    Inpatient Medications    Scheduled Meds: . calcitRIOL  0.25 mcg Oral Once per day on Mon Wed Fri  . carvedilol  25 mg Oral BID WC  . colchicine  0.6 mg Oral BID  . darbepoetin (ARANESP) injection - NON-DIALYSIS  200 mcg Subcutaneous Q Tue-1800  . heparin  5,000 Units Subcutaneous Q8H  . insulin aspart  0-5 Units Subcutaneous QHS  . insulin aspart  0-9 Units Subcutaneous TID WC  . rosuvastatin  40 mg Oral QHS  . senna-docusate  1 tablet Oral BID  . sodium bicarbonate  650 mg Oral BID  . sodium chloride flush  3 mL Intravenous Q12H  . torsemide  80 mg Oral Daily   Continuous Infusions: . sodium chloride Stopped (06/08/18 0826)  . ferric gluconate (FERRLECIT/NULECIT) IV 250 mg (06/10/18 1417)   PRN Meds: sodium chloride, acetaminophen, albuterol, ondansetron (ZOFRAN) IV, oxyCODONE, sodium chloride flush   Vital Signs    Vitals:   06/10/18 1449 06/10/18 1954 06/11/18 0300 06/11/18 0504  BP: (!) 143/84 (!) 177/102  129/76  Pulse: 83 87  89  Resp: 20 18  18   Temp: 97.7 F (36.5 C) 97.6 F (36.4 C)  98.7 F (37.1 C)  TempSrc: Oral Oral  Oral  SpO2: 100% 96%  98%  Weight:   119.8 kg   Height:        Intake/Output Summary (Last 24 hours) at 06/11/2018 1032 Last data filed at 06/11/2018 0945 Gross per 24 hour  Intake 1040 ml  Output 1350 ml  Net -310 ml   Filed Weights   06/09/18 0554 06/10/18 0119 06/11/18 0300  Weight: 122.7 kg 121.4 kg 119.8 kg    Telemetry    Sinus rhythm - Personally Reviewed  ECG    n/a - Personally Reviewed  Physical Exam   VS:  BP 129/76 (BP Location: Right Arm)   Pulse 89   Temp 98.7 F (37.1 C) (Oral)   Resp 18   Ht 5\' 2"  (1.575 m)   Wt 119.8 kg   LMP 05/29/2018   SpO2 98%   BMI 48.30 kg/m  , BMI Body mass  index is 48.3 kg/m. GENERAL:  Well appearing HEENT: Pupils equal round and reactive, fundi not visualized, oral mucosa unremarkable NECK:  No jugular venous distention, waveform within normal limits, carotid upstroke brisk and symmetric, no bruits, no thyromegaly LUNGS:  Clear to auscultation bilaterally HEART:  RRR.  PMI not displaced or sustained,S1 and S2 within normal limits, no S3, no S4, no clicks, no rubs, no murmurs ABD:  Flat, positive bowel sounds normal in frequency in pitch, no bruits, no rebound, no guarding, no midline pulsatile mass, no hepatomegaly, no splenomegaly EXT:  2 plus pulses throughout, 1+ LE edema, no cyanosis no clubbing SKIN:  No rashes no nodules NEURO:  Cranial nerves II through XII grossly intact, motor grossly intact throughout [facility]:  Cognitively intact, oriented to person place and time  Labs    Chemistry Recent Labs  Lab 06/05/18 2335  06/09/18 0514 06/10/18 0502 06/11/18 0339  NA 138   < > 138 139 140  K 5.0   < > 4.7 4.6 4.4  CL 111   < > 112* 111 111  CO2 17*   < >  15* 20* 20*  GLUCOSE 98   < > 104* 81 89  BUN 36*   < > 31* 29* 29*  CREATININE 3.77*   < > 3.83* 3.86* 3.58*  CALCIUM 8.2*   < > 8.1* 8.3* 8.3*  PROT 6.2*  --   --   --   --   ALBUMIN 2.1*  --   --   --   --   AST 14*  --   --   --   --   ALT 11  --   --   --   --   ALKPHOS 72  --   --   --   --   BILITOT 0.6  --   --   --   --   GFRNONAA 15*   < > 14* 14* 16*  GFRAA 17*   < > 17* 17* 18*  ANIONGAP 10   < > 11 8 9    < > = values in this interval not displayed.     Hematology Recent Labs  Lab 06/08/18 0423 06/09/18 1003 06/10/18 0502  WBC 5.0 4.5 4.4  RBC 2.85* 2.88* 2.77*  HGB 7.8* 7.9* 8.1*  HCT 26.1* 26.2* 25.2*  MCV 91.6 91.0 91.0  MCH 27.4 27.4 29.2  MCHC 29.9* 30.2 32.1  RDW 14.1 13.8 13.8  PLT 343 325 334    Cardiac EnzymesNo results for input(s): TROPONINI in the last 168 hours. No results for input(s): TROPIPOC in the last 168 hours.   BNP Recent  Labs  Lab 06/05/18 2335  BNP 369.8*     DDimer No results for input(s): DDIMER in the last 168 hours.   Radiology    Ct Abdomen Pelvis Wo Contrast  Result Date: 06/11/2018 CLINICAL DATA:  Renal cyst. EXAM: CT ABDOMEN AND PELVIS WITHOUT CONTRAST TECHNIQUE: Multidetector CT imaging of the abdomen and pelvis was performed following the standard protocol without IV contrast. COMPARISON:  Ultrasound of June 09, 2018. FINDINGS: Lower chest: No acute abnormality. Hepatobiliary: No focal liver abnormality is seen. No gallstones, gallbladder wall thickening, or biliary dilatation. Pancreas: Unremarkable. No pancreatic ductal dilatation or surrounding inflammatory changes. Spleen: Normal in size without focal abnormality. Adrenals/Urinary Tract: Adrenal glands are unremarkable. Kidneys are normal, without renal calculi, focal lesion, or hydronephrosis. Bladder is unremarkable. Stomach/Bowel: Stomach is within normal limits. Appendix appears normal. No evidence of bowel wall thickening, distention, or inflammatory changes. Vascular/Lymphatic: No significant vascular findings are present. No enlarged abdominal or pelvic lymph nodes. Reproductive: 3.9 cm uterine fibroid is noted. No adnexal abnormality is noted. Other: No abdominal wall hernia or abnormality. No abdominopelvic ascites. Musculoskeletal: No acute or significant osseous findings. IMPRESSION: 4 cm uterine fibroid is noted. No other definite abnormality seen in the abdomen or pelvis. Left renal abnormality noted on prior ultrasound is not visualized on this unenhanced study. Therefore, possible mass or neoplasm can not be excluded on the basis of this study. Further evaluation with MRI or follow-up ultrasound in 3 months is recommended. Electronically Signed   By: Marijo Conception, M.D.   On: 06/11/2018 10:02   US Renal  Result Date: 06/09/2018 CLINICAL DATA:  Acute renal insufficiency, chronic renal disease EXAM: RENAL / URINARY TRACT ULTRASOUND  COMPLETE COMPARISON:  None. FINDINGS: Right Kidney: Renal measurements: 13.0 x 4.9 x 6.4 cm = volume: 213 mL. No hydronephrosis is seen. No solid or cystic renal mass is evident. The echogenicity of the renal parenchyma is slightly increased consistent with chronic renal medical disease.  Left Kidney: Renal measurements: 12.2 x 6.2 x 6.1 cm = volume: 243 mL. No hydronephrosis is seen. The echogenicity of the renal parenchyma is increased. There is a hypoechoic structure in the periphery the left mid kidney of 2.4 cm probably representing complex cyst but difficult to assess on this study. Bladder: The urinary bladder is not well distended but is grossly unremarkable. IMPRESSION: 1. No hydronephrosis. 2. Somewhat echogenic renal parenchyma consistent with chronic renal medical disease. 3. Probable complex cyst in the periphery of the left mid kidney of 2.4 cm. If necessary consider CT or MRI to assess further. Electronically Signed   By: Ivar Drape M.D.   On: 06/09/2018 15:53   Vas [place] Burnard Bunting With/wo Tbi  Result Date: 06/09/2018 LOWER EXTREMITY DOPPLER STUDY Indications: Ulceration.  Performing Technologist: Carlos Levering Rvt  Examination Guidelines: A complete evaluation includes at minimum, Doppler waveform signals and systolic blood pressure reading at the level of bilateral brachial, anterior tibial, and posterior tibial arteries, when vessel segments are accessible. Bilateral testing is considered an integral part of a complete examination. Photoelectric Plethysmograph (PPG) waveforms and toe systolic pressure readings are included as required and additional duplex testing as needed. Limited examinations for reoccurring indications may be performed as noted.  ABI Findings: +---------+------------------+-----+---------+--------+ Right    Rt Pressure (mmHg)IndexWaveform Comment  +---------+------------------+-----+---------+--------+ Brachial 164                    triphasic          +---------+------------------+-----+---------+--------+ PTA      198               1.14 triphasic         +---------+------------------+-----+---------+--------+ DP       198               1.14 triphasic         +---------+------------------+-----+---------+--------+ Great Toe139               0.80                   +---------+------------------+-----+---------+--------+ +---------+------------------+-----+---------+-------+ Left     Lt Pressure (mmHg)IndexWaveform Comment +---------+------------------+-----+---------+-------+ Brachial 173                    triphasic        +---------+------------------+-----+---------+-------+ PTA      199               1.15 triphasic        +---------+------------------+-----+---------+-------+ DP       200               1.16 triphasic        +---------+------------------+-----+---------+-------+ Great Toe139               0.80                  +---------+------------------+-----+---------+-------+ +-------+-----------+-----------+------------+------------+ ABI/TBIToday's ABIToday's TBIPrevious ABIPrevious TBI +-------+-----------+-----------+------------+------------+ Right  1.14       0.8                                 +-------+-----------+-----------+------------+------------+ Left   1.16       0.8                                 +-------+-----------+-----------+------------+------------+  Summary: Right: Resting right ankle-brachial  index is within normal range. No evidence of significant right lower extremity arterial disease. The right toe-brachial index is normal. Left: Resting left ankle-brachial index is within normal range. No evidence of significant left lower extremity arterial disease. The left toe-brachial index is normal.  *See table(s) above for measurements and observations.  Electronically signed by Deitra Mayo MD on 06/09/2018 at 4:32:04 PM.   Final     Cardiac Studies   Echo  05/25/18: Study Conclusions  - Left ventricle: The cavity size was mildly dilated. There was   mild concentric hypertrophy. Systolic function was mildly   reduced. The estimated ejection fraction was in the range of 45%   to 50%. Diffuse hypokinesis. Features are consistent with a   pseudonormal left ventricular filling pattern, with concomitant   abnormal relaxation and increased filling pressure (grade 2   diastolic dysfunction). - Left atrium: The atrium was mildly dilated.  Patient Profile     Ms. Koppel is a 15F with RA, CKD (diabetic glomerulosclerosis), diabetes, diabetic neuropathy, hypertension, hyperlipidemia, and GERD here with acute on chronic systolic and diastolic heart failure.   Assessment & Plan    # Acute on chronic systolic and diastolic heart failure: LVEF 45-50%.  She never had an ischemic work up.  Will plan for [facility] tomorrow.  She was net -578mL, though her weight is reportedly down 2kg?Marland Kitchen  Torsemide was increased per nephrology.  Agree, as she remains volume overloaded.    # Hypertension: Hydralazine was discontinued 2/2 edema.  BP was elevated yesterday and well-controlled this am before morning medication.  Carvedilol was increased this admission.  Will continue to monitor.  Could add clonidine if needed.   # Hyperlipidemia: Continue rosuvastatin.    For questions or updates, please contact Why Please consult [email or web address] for contact info under        Signed, Skeet Latch, MD  06/11/2018, 10:32 AM

## 2018-06-11 NOTE — Progress Notes (Signed)
PROGRESS NOTE  Carla Compton WUJ:[id number] DOB: [birth year] DOA: 06/05/2018 PCP: No primary care provider on file.  HPI/Recap of past 24 hours: Carla Compton a 37 y.o.femalewith medical history significant ofDM2, CKD stage 4, HTN. Patient was recently admitted to our service from Dec 1-7 with CHF exacerbation. 2d echo showed EF 95-62%, grade 2 diastolic dysfunction. Of concern creat was ranging between 3-3.98 during that admission. Patient was diuresed and discharged on torsemide 52m daily.  Patient came to ED at OSH for indigestion symptoms but was admitted for creat 4.34 (BNP 340 on admit).  Hospital course complicated by questionable right foot osteomyelitis incidentally found on right foot x-ray and on MRI.  No trauma or puncture wound.  Discussed with orthopedic surgery [name] suspects gout and will follow in the office post discharge within a week.  Started on colchicine 0.6 mg twice daily.  Allopurinol will be started outpatient in Dr. [name].   06/09/2018: Patient seen and examined with her sister at bedside.  No new complaints.  Questionable right foot osteomyelitis.  DC IV antibiotics.    06/10/2018: Patient seen and examined with her sister at her bedside.  No new complaints.  No acute events overnight.  Nephrology following and diuresing due to fluid overload.  06/11/18: Patient seen and examined with her sister at her bedside.  No new complaints.  Plan for ischemic work-up by cardiology.   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 (GFR 15-29) (HCC)   CKD (chronic kidney disease) stage 4, GFR 15-29 ml/min (HCC)   Diabetic polyneuropathy associated with type 2 diabetes mellitus (HCC)   Severe protein-calorie malnutrition (HCC)   AKI (acute kidney injury) (HCC)  Fluid overload suspect  multifactorial secondary to worsening advanced renal failure versus acute on chronic combined diastolic and systolic CHF Continue monitor urine output Continue strict I's and O's Torsemide increased to 80 mg daily per nephrology  Probable complex cyst in the periphery of the left mid kidney measuring 2.4 cm CT abdomen pelvis without contrast ordered to further assess possible mass or neoplasm cannot be excluded.  Recommendation for further evaluation with MRI or follow-up ultrasound in 3 months.  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 DSharol Givenoutpatient within a week ABI negative  History of rheumatoid arthritis Self-reports receive injection every 8 weeks for rheumatoid arthritis Follow-up with rheumatology outpatient  Hyperkalemia, resolving Suspect secondary to advanced renal failure Treated with IV calcium gluconate 1 g once and 30 g of p.o. Kayexalate once  Left fifth toe wound, present admission Wound care specialist consulted.  Appreciate recommendations  Type 2 diabetes with hyperglycemia Last A1c 7.2 on 05/26/2018 Continue insulin sliding scale  Diabetes polyneuropathy  ABI negative  AKI on CKD 4 Baseline  appears to be 3.0 with GFR of 22 Creatinine improving from 3.86 to 3.58 Continue to avoid nephrotoxic agent Torsemide increased to 80 mg daily Continue to monitor urine output and electrolytes  Anemia of chronic disease Hemoglobin improving 8.1 from 7.9 from 7.8  No sign of overt bleeding Aranesp and ferric gluconate per nephrology Repeat CBC in the morning  Severe morbid obesity BMI 49 Recommend weight loss outpatient Healthy dieting and regular exercise  Resolving intermittent right ear pain unclear etiology No drainage Pain management in place   DVT prophylaxis: Heparin [place] 3 times daily Code Status: Full Family Communication:  Sister in the room.  All questions answered to her satisfaction. Disposition Plan:  Home when nephrology and general surgery signed off Consults called:  Nephrology and general surgery    Objective: Vitals:   06/10/18 1954 06/11/18 0300 06/11/18 0504 06/11/18 1122  BP: (!) 177/102  129/76 (!) 154/93  Pulse: 87  89 83  Resp: '18  18 18  ' Temp: 97.6 F (36.4 C)  98.7 F (37.1 C)   TempSrc: Oral  Oral   SpO2: 96%  98% 99%  Weight:  119.8 kg    Height:        Intake/Output Summary (Last 24 hours) at 06/11/2018 1516 Last data filed at 06/11/2018 1100 Gross per 24 hour  Intake 943 ml  Output 1100 ml  Net -157 ml   Filed Weights   06/09/18 0554 06/10/18 0119 06/11/18 0300  Weight: 122.7 kg 121.4 kg 119.8 kg    Exam:  . General: 37 y.o. year-old female well-developed well-nourished in no acute distress.  Alert oriented x3.   . Cardiovascular: Regular rate and rhythm with no rubs or gallops.  No JVD or thyromegaly noted.   Marland Kitchen Respiratory: Clear to auscultation with no wheezes or rales.  Good inspiratory effort. . Abdomen: Soft nontender nondistended with normal bowel sounds x4 quadrants. . Musculoskeletal: 1+ pitting edema in lower extremities. . Skin: Right lateral foot blister with swelling.  Left fifth toe wound. Marland Kitchen Psychiatry: Mood is  appropriate for condition and setting   Data Reviewed: CBC: Recent Labs  Lab 06/05/18 2335 06/08/18 0423 06/09/18 1003 06/10/18 0502  WBC 5.7 5.0 4.5 4.4  NEUTROABS 3.4 2.6 2.0 1.9  HGB 8.4* 7.8* 7.9* 8.1*  HCT 27.2* 26.1* 26.2* 25.2*  MCV 91.0 91.6 91.0 91.0  PLT 308 343 325 700   Basic Metabolic Panel: Recent Labs  Lab 06/07/18 0419 06/08/18 0423 06/08/18 1429 06/09/18 0514 06/10/18 0502 06/11/18 0339  NA 138 137  --  138 139 140  K 5.1 5.4* 4.9 4.7 4.6 4.4  CL 109 110  --  112* 111 111  CO2 18* 17*  --  15* 20* 20*  GLUCOSE 70 132*  --  104* 81 89  BUN 35* 35*  --  31* 29* 29*  CREATININE 3.79* 3.98*  --  3.83* 3.86* 3.58*  CALCIUM 8.1* 8.0*  --  8.1* 8.3* 8.3*  PHOS  --   --   --  6.2*  --   --    GFR: Estimated Creatinine Clearance: 27 mL/min (A) (by C-G formula based on SCr of 3.58 mg/dL (H)). Liver Function Tests: Recent Labs  Lab 06/05/18 2335  AST 14*  ALT 11  ALKPHOS 72  BILITOT 0.6  PROT 6.2*  ALBUMIN 2.1*   No results for input(s): LIPASE, AMYLASE in the last 168 hours. No results for input(s): AMMONIA  in the last 168 hours. Coagulation Profile: No results for input(s): INR, PROTIME in the last 168 hours. Cardiac Enzymes: No results for input(s): CKTOTAL, CKMB, CKMBINDEX, TROPONINI in the last 168 hours. BNP (last 3 results) No results for input(s): PROBNP in the last 8760 hours. HbA1C: No results for input(s): HGBA1C in the last 72 hours. CBG: Recent Labs  Lab 06/10/18 1153 06/10/18 1619 06/10/18 2115 06/11/18 0723 06/11/18 1124  GLUCAP 103* 106* 96 94 86   Lipid Profile: No results for input(s): CHOL, HDL, LDLCALC, TRIG, CHOLHDL, LDLDIRECT in the last 72 hours. Thyroid Function Tests: No results for input(s): TSH, T4TOTAL, FREET4, T3FREE, THYROIDAB in the last 72 hours. Anemia Panel: Recent Labs    06/09/18 0514  FERRITIN 73  TIBC 204*  IRON 42   Urine analysis: No results found for: COLORURINE, APPEARANCEUR, LABSPEC,  PHURINE, GLUCOSEU, HGBUR, BILIRUBINUR, KETONESUR, PROTEINUR, UROBILINOGEN, NITRITE, LEUKOCYTESUR Sepsis Labs: '@LABRCNTIP' (procalcitonin:4,lacticidven:4)  )No results found for this or any previous visit (from the past 240 hour(s)).    Studies: Ct Abdomen Pelvis Wo Contrast  Result Date: 06/11/2018 CLINICAL DATA:  Renal cyst. EXAM: CT ABDOMEN AND PELVIS WITHOUT CONTRAST TECHNIQUE: Multidetector CT imaging of the abdomen and pelvis was performed following the standard protocol without IV contrast. COMPARISON:  Ultrasound of June 09, 2018. FINDINGS: Lower chest: No acute abnormality. Hepatobiliary: No focal liver abnormality is seen. No gallstones, gallbladder wall thickening, or biliary dilatation. Pancreas: Unremarkable. No pancreatic ductal dilatation or surrounding inflammatory changes. Spleen: Normal in size without focal abnormality. Adrenals/Urinary Tract: Adrenal glands are unremarkable. Kidneys are normal, without renal calculi, focal lesion, or hydronephrosis. Bladder is unremarkable. Stomach/Bowel: Stomach is within normal limits. Appendix appears normal. No evidence of bowel wall thickening, distention, or inflammatory changes. Vascular/Lymphatic: No significant vascular findings are present. No enlarged abdominal or pelvic lymph nodes. Reproductive: 3.9 cm uterine fibroid is noted. No adnexal abnormality is noted. Other: No abdominal wall hernia or abnormality. No abdominopelvic ascites. Musculoskeletal: No acute or significant osseous findings. IMPRESSION: 4 cm uterine fibroid is noted. No other definite abnormality seen in the abdomen or pelvis. Left renal abnormality noted on prior ultrasound is not visualized on this unenhanced study. Therefore, possible mass or neoplasm can not be excluded on the basis of this study. Further evaluation with MRI or follow-up ultrasound in 3 months is recommended. Electronically Signed   By: Marijo Conception, M.D.   On: 06/11/2018 10:02    Scheduled  Meds: . calcitRIOL  0.25 mcg Oral Once per day on Mon Wed Fri  . carvedilol  25 mg Oral BID WC  . colchicine  0.6 mg Oral BID  . darbepoetin (ARANESP) injection - NON-DIALYSIS  200 mcg Subcutaneous Q Tue-1800  . heparin  5,000 Units Subcutaneous Q8H  . insulin aspart  0-5 Units Subcutaneous QHS  . insulin aspart  0-9 Units Subcutaneous TID WC  . rosuvastatin  40 mg Oral QHS  . senna-docusate  1 tablet Oral BID  . sodium bicarbonate  650 mg Oral BID  . sodium chloride flush  3 mL Intravenous Q12H  . torsemide  80 mg Oral Daily    Continuous Infusions: . sodium chloride Stopped (06/08/18 0826)  . ferric gluconate (FERRLECIT/NULECIT) IV 250 mg (06/11/18 1100)     LOS: 6 days     Kayleen Memos, MD [facility] Pager [phone]  If 7PM-7AM, please contact night-coverage [email or web address] Password [email or web address] 06/11/2018, 3:16 PM

## 2018-06-11 NOTE — Progress Notes (Signed)
Subjective:  Only 750 of UOP recorded but she reports more-  crt slightly better - no new c/o's  Objective Vital signs in last 24 hours: Vitals:   06/10/18 1449 06/10/18 1954 06/11/18 0300 06/11/18 0504  BP: (!) 143/84 (!) 177/102  129/76  Pulse: 83 87  89  Resp: 20 18  18   Temp: 97.7 F (36.5 C) 97.6 F (36.4 C)  98.7 F (37.1 C)  TempSrc: Oral Oral  Oral  SpO2: 100% 96%  98%  Weight:   119.8 kg   Height:       Weight change: -1.588 kg  Intake/Output Summary (Last 24 hours) at 06/11/2018 4098 Last data filed at 06/11/2018 1191 Gross per 24 hour  Intake 1000 ml  Output 1350 ml  Net -350 ml    Assessment/Plan: 37 year old black female with biopsy-proven diabetic glomerulosclerosis with nephrotic syndrome who has had rocky course of late with episodes of AKI and volume overload 1.Renal-patient appears to have severe diabetic glomerulosclerosis.  As a result she has had fairly rapidly worsening renal function.  I suspect her rising creatinine with diuresis is just an unmasking of what her true GFR is and not necessarily worsening of it-in any event creatinine has been fairly stable albeit poor and did trend better today.  There are no indications for dialysis.  I will continue to follow her  2. Hypertension/volume  -she is volume overloaded.  I  increased her diuretics to torsemide 80 daily- and stopped her hydralazine as that can also cause edema- weight is down 3.  Osteomyelitis-new diagnosis but now in question. Now ortho thinking charcot joint vs gout- investigation ongoing.  Be careful with the vancomycin levels-also on Maxipime 4. Anemia  -this is not helping her third spacing situation.   iron stores low, repleting and have started an ESA 5.  Bones-PTH 45.  I see that she is already on calcitriol likely from her nephrologist in [place] 6.  Hyperkalemia- had been on supplementation in the past.  Status post 1 dose of Kayexalate 12/16 with an improved level.  I suspect it will go  down with diuresis- stable today  7. Metabolic acidosis- giving bicarb   Louis Meckel    Labs: Basic Metabolic Panel: Recent Labs  Lab 06/09/18 0514 06/10/18 0502 06/11/18 0339  NA 138 139 140  K 4.7 4.6 4.4  CL 112* 111 111  CO2 15* 20* 20*  GLUCOSE 104* 81 89  BUN 31* 29* 29*  CREATININE 3.83* 3.86* 3.58*  CALCIUM 8.1* 8.3* 8.3*  PHOS 6.2*  --   --    Liver Function Tests: Recent Labs  Lab 06/05/18 2335  AST 14*  ALT 11  ALKPHOS 72  BILITOT 0.6  PROT 6.2*  ALBUMIN 2.1*   No results for input(s): LIPASE, AMYLASE in the last 168 hours. No results for input(s): AMMONIA in the last 168 hours. CBC: Recent Labs  Lab 06/05/18 2335 06/08/18 0423 06/09/18 1003 06/10/18 0502  WBC 5.7 5.0 4.5 4.4  NEUTROABS 3.4 2.6 2.0 1.9  HGB 8.4* 7.8* 7.9* 8.1*  HCT 27.2* 26.1* 26.2* 25.2*  MCV 91.0 91.6 91.0 91.0  PLT 308 343 325 334   Cardiac Enzymes: No results for input(s): CKTOTAL, CKMB, CKMBINDEX, TROPONINI in the last 168 hours. CBG: Recent Labs  Lab 06/10/18 0734 06/10/18 1153 06/10/18 1619 06/10/18 2115 06/11/18 0723  GLUCAP 84 103* 106* 96 94    Iron Studies:  Recent Labs    06/09/18 0514  IRON 42  TIBC 204*  FERRITIN 73   Studies/Results: US Renal  Result Date: 06/09/2018 CLINICAL DATA:  Acute renal insufficiency, chronic renal disease EXAM: RENAL / URINARY TRACT ULTRASOUND COMPLETE COMPARISON:  None. FINDINGS: Right Kidney: Renal measurements: 13.0 x 4.9 x 6.4 cm = volume: 213 mL. No hydronephrosis is seen. No solid or cystic renal mass is evident. The echogenicity of the renal parenchyma is slightly increased consistent with chronic renal medical disease. Left Kidney: Renal measurements: 12.2 x 6.2 x 6.1 cm = volume: 243 mL. No hydronephrosis is seen. The echogenicity of the renal parenchyma is increased. There is a hypoechoic structure in the periphery the left mid kidney of 2.4 cm probably representing complex cyst but difficult to assess  on this study. Bladder: The urinary bladder is not well distended but is grossly unremarkable. IMPRESSION: 1. No hydronephrosis. 2. Somewhat echogenic renal parenchyma consistent with chronic renal medical disease. 3. Probable complex cyst in the periphery of the left mid kidney of 2.4 cm. If necessary consider CT or MRI to assess further. Electronically Signed   By: Ivar Drape M.D.   On: 06/09/2018 15:53   Vas [place] Burnard Bunting With/wo Tbi  Result Date: 06/09/2018 LOWER EXTREMITY DOPPLER STUDY Indications: Ulceration.  Performing Technologist: Carlos Levering Rvt  Examination Guidelines: A complete evaluation includes at minimum, Doppler waveform signals and systolic blood pressure reading at the level of bilateral brachial, anterior tibial, and posterior tibial arteries, when vessel segments are accessible. Bilateral testing is considered an integral part of a complete examination. Photoelectric Plethysmograph (PPG) waveforms and toe systolic pressure readings are included as required and additional duplex testing as needed. Limited examinations for reoccurring indications may be performed as noted.  ABI Findings: +---------+------------------+-----+---------+--------+ Right    Rt Pressure (mmHg)IndexWaveform Comment  +---------+------------------+-----+---------+--------+ Brachial 164                    triphasic         +---------+------------------+-----+---------+--------+ PTA      198               1.14 triphasic         +---------+------------------+-----+---------+--------+ DP       198               1.14 triphasic         +---------+------------------+-----+---------+--------+ Great Toe139               0.80                   +---------+------------------+-----+---------+--------+ +---------+------------------+-----+---------+-------+ Left     Lt Pressure (mmHg)IndexWaveform Comment +---------+------------------+-----+---------+-------+ Brachial 173                     triphasic        +---------+------------------+-----+---------+-------+ PTA      199               1.15 triphasic        +---------+------------------+-----+---------+-------+ DP       200               1.16 triphasic        +---------+------------------+-----+---------+-------+ Great Toe139               0.80                  +---------+------------------+-----+---------+-------+ +-------+-----------+-----------+------------+------------+ ABI/TBIToday's ABIToday's TBIPrevious ABIPrevious TBI +-------+-----------+-----------+------------+------------+ Right  1.14       0.8                                 +-------+-----------+-----------+------------+------------+  Left   1.16       0.8                                 +-------+-----------+-----------+------------+------------+  Summary: Right: Resting right ankle-brachial index is within normal range. No evidence of significant right lower extremity arterial disease. The right toe-brachial index is normal. Left: Resting left ankle-brachial index is within normal range. No evidence of significant left lower extremity arterial disease. The left toe-brachial index is normal.  *See table(s) above for measurements and observations.  Electronically signed by Deitra Mayo MD on 06/09/2018 at 4:32:04 PM.   Final    Medications: Infusions: . sodium chloride Stopped (06/08/18 0826)  . ferric gluconate (FERRLECIT/NULECIT) IV 250 mg (06/10/18 1417)    Scheduled Medications: . calcitRIOL  0.25 mcg Oral Once per day on Mon Wed Fri  . carvedilol  25 mg Oral BID WC  . colchicine  0.6 mg Oral BID  . darbepoetin (ARANESP) injection - NON-DIALYSIS  200 mcg Subcutaneous Q Tue-1800  . heparin  5,000 Units Subcutaneous Q8H  . insulin aspart  0-5 Units Subcutaneous QHS  . insulin aspart  0-9 Units Subcutaneous TID WC  . rosuvastatin  40 mg Oral QHS  . senna-docusate  1 tablet Oral BID  . sodium bicarbonate  650 mg Oral BID  .  sodium chloride flush  3 mL Intravenous Q12H  . torsemide  80 mg Oral Daily    have reviewed scheduled and prn medications.  Physical Exam: General: NAD- obese and limited mobility  Heart: RRR Lungs: dec BS at bases Abdomen: obese, soft, non tender Extremities: pitting edema    06/11/2018,9:22 AM  LOS: 6 days

## 2018-06-11 NOTE — Plan of Care (Signed)
  Problem: Education: Goal: Ability to demonstrate management of disease process will improve Outcome: Progressing Goal: Ability to verbalize understanding of medication therapies will improve Outcome: Progressing   Problem: Activity: Goal: Capacity to carry out activities will improve Outcome: Progressing   Problem: Cardiac: Goal: Ability to achieve and maintain adequate cardiopulmonary perfusion will improve Outcome: Progressing   Problem: Health Behavior/Discharge Planning: Goal: Ability to manage health-related needs will improve Outcome: Progressing   Problem: Clinical Measurements: Goal: Ability to maintain clinical measurements within normal limits will improve Outcome: Progressing Goal: Will remain free from infection Outcome: Progressing Goal: Diagnostic test results will improve Outcome: Progressing Goal: Respiratory complications will improve Outcome: Progressing Goal: Cardiovascular complication will be avoided Outcome: Progressing   Problem: Activity: Goal: Risk for activity intolerance will decrease Outcome: Progressing   Problem: Nutrition: Goal: Adequate nutrition will be maintained Outcome: Progressing   Problem: Elimination: Goal: Will not experience complications related to bowel motility Outcome: Progressing Goal: Will not experience complications related to urinary retention Outcome: Progressing

## 2018-06-12 DIAGNOSIS — R609 Edema, unspecified: Secondary | ICD-10-CM

## 2018-06-12 LAB — RENAL FUNCTION PANEL
Albumin: 2.2 g/dL — ABNORMAL LOW (ref 3.5–5.0)
Anion gap: 10 (ref 5–15)
BUN: 28 mg/dL — ABNORMAL HIGH (ref 6–20)
CHLORIDE: 110 mmol/L (ref 98–111)
CO2: 19 mmol/L — ABNORMAL LOW (ref 22–32)
CREATININE: 3.79 mg/dL — AB (ref 0.44–1.00)
Calcium: 8.3 mg/dL — ABNORMAL LOW (ref 8.9–10.3)
GFR calc Af Amer: 17 mL/min — ABNORMAL LOW (ref >60)
GFR calc non Af Amer: 15 mL/min — ABNORMAL LOW (ref >60)
Glucose, Bld: 79 mg/dL (ref 70–99)
Phosphorus: 5.9 mg/dL — ABNORMAL HIGH (ref 2.5–4.6)
Potassium: 4.1 mmol/L (ref 3.5–5.1)
Sodium: 139 mmol/L (ref 135–145)

## 2018-06-12 LAB — NM MYOCAR MULTI W/SPECT W/WALL MOTION / EF
Exercise duration (min): 5 min
Exercise duration (sec): 13 s
Peak HR: 101 {beats}/min
Rest HR: 85 {beats}/min

## 2018-06-12 LAB — GLUCOSE, CAPILLARY
Glucose-Capillary: 72 mg/dL (ref 70–99)
Glucose-Capillary: 124 mg/dL — ABNORMAL HIGH (ref 70–99)
Glucose-Capillary: 111 mg/dL — ABNORMAL HIGH (ref 70–99)
Glucose-Capillary: 113 mg/dL — ABNORMAL HIGH (ref 70–99)

## 2018-06-12 MED ORDER — TECHNETIUM TC 99M TETROFOSMIN IV KIT
30.0000 | PACK | Freq: Once | INTRAVENOUS | Status: AC | PRN
  Administered 2018-06-12: 30 via INTRAVENOUS

## 2018-06-12 MED ORDER — REGADENOSON 0.4 MG/5ML IV SOLN
INTRAVENOUS | Status: AC
  Filled 2018-06-12: qty 5

## 2018-06-12 MED ORDER — REGADENOSON 0.4 MG/5ML IV SOLN
0.4000 mg | Freq: Once | INTRAVENOUS | Status: AC
  Administered 2018-06-12: 0.4 mg via INTRAVENOUS
  Filled 2018-06-12: qty 5

## 2018-06-12 MED ORDER — CLONIDINE HCL 0.1 MG PO TABS
0.1000 mg | ORAL_TABLET | Freq: Two times a day (BID) | ORAL | Status: DC
  Administered 2018-06-12 – 2018-06-13 (×3): 0.1 mg via ORAL
  Filled 2018-06-12 (×3): qty 1

## 2018-06-12 NOTE — Progress Notes (Addendum)
   I have informed pt of abnormal stress test. Informed Dr. Oval Linsey. Will need to be reviewed by cardiologist tomorrow and decision on further workup.   [name], AGNP-C [facility] 06/12/2018  8:44 PM

## 2018-06-12 NOTE — Progress Notes (Signed)
    Patient presented for 2nd day of 2 day Lexiscan nuclear stress test. Tolerated procedure well. Pending final stress imaging result.  [name], AGNP-C 06/12/2018  11:25 AM Pager: [phone]

## 2018-06-12 NOTE — Progress Notes (Signed)
lexiscan complete

## 2018-06-12 NOTE — Progress Notes (Signed)
Subjective:  1400 UOP recorded-  Weight down- crt really stable - not uremic Objective Vital signs in last 24 hours: Vitals:   06/11/18 0504 06/11/18 1122 06/11/18 1718 06/12/18 0907  BP: 129/76 (!) 154/93 (!) 164/101 (!) 152/90  Pulse: 89 83 84 87  Resp: 18 18  18   Temp: 98.7 F (37.1 C)     TempSrc: Oral     SpO2: 98% 99%  99%  Weight:      Height:       Weight change:   Intake/Output Summary (Last 24 hours) at 06/12/2018 0957 Last data filed at 06/12/2018 0910 Gross per 24 hour  Intake 609 ml  Output 800 ml  Net -191 ml    Assessment/Plan: 37 year old black female with biopsy-proven diabetic glomerulosclerosis with nephrotic syndrome who has had rocky course of late with episodes of AKI and volume overload 1.Renal-patient appears to have severe diabetic glomerulosclerosis.  As a result she has had fairly rapidly worsening renal function.  I suspect her rising creatinine with diuresis is just an unmasking of what her true GFR is and not necessarily worsening of it-in any event creatinine has been fairly stable albeit poor.  There are no indications for dialysis.    ? Renal mass- CT did not show, rec repeat U/S in 3 mos  2. Hypertension/volume  -she is volume overloaded.  I  increased her diuretics to torsemide 80 daily- and stopped her hydralazine as that can also cause edema- weight is coming down- continue as dont feel is dry  3.  Osteomyelitis-new diagnosis but now in question. Now ortho thinking charcot joint vs gout- investigation ongoing.  Abx stopped, follow up with Dr Duda 4. Anemia  -this is not helping her third spacing situation.   iron stores low, repleting and have started an ESA 5.  Bones-PTH 45.  I see that she is already on calcitriol likely from her nephrologist in [place] 5.9- no action yet  6.  Hyperkalemia- had been on supplementation in the past.   I suspect it will go down with diuresis- stable today  7. Metabolic acidosis- giving bicarb 8. Dispo-  renal is OK with discharge- on torsemide 80 mg PO daily- would cont to hold other BP meds as I anticipate as volume status improves BP will improve.  to follow up with her primary nephrologist in [place]: Basic Metabolic Panel: Recent Labs  Lab 06/09/18 0514 06/10/18 0502 06/11/18 0339 06/12/18 0553  NA 138 139 140 139  K 4.7 4.6 4.4 4.1  CL 112* 111 111 110  CO2 15* 20* 20* 19*  GLUCOSE 104* 81 89 79  BUN 31* 29* 29* 28*  CREATININE 3.83* 3.86* 3.58* 3.79*  CALCIUM 8.1* 8.3* 8.3* 8.3*  PHOS 6.2*  --   --  5.9*   Liver Function Tests: Recent Labs  Lab 06/05/18 2335 06/12/18 0553  AST 14*  --   ALT 11  --   ALKPHOS 72  --   BILITOT 0.6  --   PROT 6.2*  --   ALBUMIN 2.1* 2.2*   No results for input(s): LIPASE, AMYLASE in the last 168 hours. No results for input(s): AMMONIA in the last 168 hours. CBC: Recent Labs  Lab 06/05/18 2335 06/08/18 0423 06/09/18 1003 06/10/18 0502  WBC 5.7 5.0 4.5 4.4  NEUTROABS 3.4 2.6 2.0 1.9  HGB 8.4* 7.8* 7.9* 8.1*  HCT 27.2* 26.1* 26.2* 25.2*  MCV 91.0 91.6 91.0  91.0  PLT 308 343 325 334   Cardiac Enzymes: No results for input(s): CKTOTAL, CKMB, CKMBINDEX, TROPONINI in the last 168 hours. CBG: Recent Labs  Lab 06/11/18 0723 06/11/18 1124 06/11/18 1635 06/11/18 2115 06/12/18 0727  GLUCAP 94 86 98 96 72    Iron Studies:  No results for input(s): IRON, TIBC, TRANSFERRIN, FERRITIN in the last 72 hours. Studies/Results: Ct Abdomen Pelvis Wo Contrast  Result Date: 06/11/2018 CLINICAL DATA:  Renal cyst. EXAM: CT ABDOMEN AND PELVIS WITHOUT CONTRAST TECHNIQUE: Multidetector CT imaging of the abdomen and pelvis was performed following the standard protocol without IV contrast. COMPARISON:  Ultrasound of June 09, 2018. FINDINGS: Lower chest: No acute abnormality. Hepatobiliary: No focal liver abnormality is seen. No gallstones, gallbladder wall thickening, or biliary dilatation. Pancreas:  Unremarkable. No pancreatic ductal dilatation or surrounding inflammatory changes. Spleen: Normal in size without focal abnormality. Adrenals/Urinary Tract: Adrenal glands are unremarkable. Kidneys are normal, without renal calculi, focal lesion, or hydronephrosis. Bladder is unremarkable. Stomach/Bowel: Stomach is within normal limits. Appendix appears normal. No evidence of bowel wall thickening, distention, or inflammatory changes. Vascular/Lymphatic: No significant vascular findings are present. No enlarged abdominal or pelvic lymph nodes. Reproductive: 3.9 cm uterine fibroid is noted. No adnexal abnormality is noted. Other: No abdominal wall hernia or abnormality. No abdominopelvic ascites. Musculoskeletal: No acute or significant osseous findings. IMPRESSION: 4 cm uterine fibroid is noted. No other definite abnormality seen in the abdomen or pelvis. Left renal abnormality noted on prior ultrasound is not visualized on this unenhanced study. Therefore, possible mass or neoplasm can not be excluded on the basis of this study. Further evaluation with MRI or follow-up ultrasound in 3 months is recommended. Electronically Signed   By: Marijo Conception, M.D.   On: 06/11/2018 10:02   Medications: Infusions: . sodium chloride Stopped (06/08/18 0826)  . ferric gluconate (FERRLECIT/NULECIT) IV 250 mg (06/11/18 1100)    Scheduled Medications: . calcitRIOL  0.25 mcg Oral Once per day on Mon Wed Fri  . carvedilol  25 mg Oral BID WC  . cloNIDine  0.1 mg Oral BID  . colchicine  0.6 mg Oral BID  . darbepoetin (ARANESP) injection - NON-DIALYSIS  200 mcg Subcutaneous Q Tue-1800  . heparin  5,000 Units Subcutaneous Q8H  . insulin aspart  0-5 Units Subcutaneous QHS  . insulin aspart  0-9 Units Subcutaneous TID WC  . rosuvastatin  40 mg Oral QHS  . senna-docusate  1 tablet Oral BID  . sodium bicarbonate  650 mg Oral BID  . sodium chloride flush  3 mL Intravenous Q12H  . torsemide  80 mg Oral Daily    have  reviewed scheduled and prn medications.  Physical Exam: General: NAD- obese and limited mobility  Heart: RRR Lungs: dec BS at bases Abdomen: obese, soft, non tender Extremities: pitting edema still    06/12/2018,9:57 AM  LOS: 7 days

## 2018-06-12 NOTE — Progress Notes (Signed)
Progress Note  Patient Name: Carla Compton Date of Encounter: 06/12/2018  Primary Cardiologist: Pixie Casino, MD   Subjective   Patient seen during stress test. No chest pain or shortness of breath.   Inpatient Medications    Scheduled Meds: . regadenoson      . calcitRIOL  0.25 mcg Oral Once per day on Mon Wed Fri  . carvedilol  25 mg Oral BID WC  . cloNIDine  0.1 mg Oral BID  . colchicine  0.6 mg Oral BID  . darbepoetin (ARANESP) injection - NON-DIALYSIS  200 mcg Subcutaneous Q Tue-1800  . heparin  5,000 Units Subcutaneous Q8H  . insulin aspart  0-5 Units Subcutaneous QHS  . insulin aspart  0-9 Units Subcutaneous TID WC  . rosuvastatin  40 mg Oral QHS  . senna-docusate  1 tablet Oral BID  . sodium bicarbonate  650 mg Oral BID  . sodium chloride flush  3 mL Intravenous Q12H  . torsemide  80 mg Oral Daily   Continuous Infusions: . sodium chloride Stopped (06/08/18 0826)  . ferric gluconate (FERRLECIT/NULECIT) IV 250 mg (06/11/18 1100)   PRN Meds: sodium chloride, acetaminophen, albuterol, ondansetron (ZOFRAN) IV, oxyCODONE, sodium chloride flush   Vital Signs    Vitals:   06/12/18 1104 06/12/18 1110 06/12/18 1112 06/12/18 1114  BP: (!) 183/106 (!) 192/88 (!) 160/90 (!) 169/94  Pulse: 85 99 (!) 101 99  Resp:      Temp:      TempSrc:      SpO2:      Weight:      Height:        Intake/Output Summary (Last 24 hours) at 06/12/2018 1122 Last data filed at 06/12/2018 0910 Gross per 24 hour  Intake 606 ml  Output 800 ml  Net -194 ml   Filed Weights   06/09/18 0554 06/10/18 0119 06/11/18 0300  Weight: 122.7 kg 121.4 kg 119.8 kg    Telemetry    NSR - Personally Reviewed  ECG    No new tracings, NSR on stress test - Personally Reviewed  Physical Exam   GEN: Obese female, No acute distress.   Neck: No JVD Cardiac: RRR, no murmurs, rubs, or gallops.  Respiratory: Clear to auscultation bilaterally. GI: Soft, nontender, non-distended  MS: Trace  lower leg edema Neuro:  Nonfocal  Psych: Normal affect   Labs    Chemistry Recent Labs  Lab 06/05/18 2335  06/10/18 0502 06/11/18 0339 06/12/18 0553  NA 138   < > 139 140 139  K 5.0   < > 4.6 4.4 4.1  CL 111   < > 111 111 110  CO2 17*   < > 20* 20* 19*  GLUCOSE 98   < > 81 89 79  BUN 36*   < > 29* 29* 28*  CREATININE 3.77*   < > 3.86* 3.58* 3.79*  CALCIUM 8.2*   < > 8.3* 8.3* 8.3*  PROT 6.2*  --   --   --   --   ALBUMIN 2.1*  --   --   --  2.2*  AST 14*  --   --   --   --   ALT 11  --   --   --   --   ALKPHOS 72  --   --   --   --   BILITOT 0.6  --   --   --   --   GFRNONAA 15*   < >  14* 16* 15*  GFRAA 17*   < > 17* 18* 17*  ANIONGAP 10   < > 8 9 10    < > = values in this interval not displayed.     Hematology Recent Labs  Lab 06/08/18 0423 06/09/18 1003 06/10/18 0502  WBC 5.0 4.5 4.4  RBC 2.85* 2.88* 2.77*  HGB 7.8* 7.9* 8.1*  HCT 26.1* 26.2* 25.2*  MCV 91.6 91.0 91.0  MCH 27.4 27.4 29.2  MCHC 29.9* 30.2 32.1  RDW 14.1 13.8 13.8  PLT 343 325 334    Cardiac EnzymesNo results for input(s): TROPONINI in the last 168 hours. No results for input(s): TROPIPOC in the last 168 hours.   BNP Recent Labs  Lab 06/05/18 2335  BNP 369.8*     DDimer No results for input(s): DDIMER in the last 168 hours.   Radiology    Ct Abdomen Pelvis Wo Contrast  Result Date: 06/11/2018 CLINICAL DATA:  Renal cyst. EXAM: CT ABDOMEN AND PELVIS WITHOUT CONTRAST TECHNIQUE: Multidetector CT imaging of the abdomen and pelvis was performed following the standard protocol without IV contrast. COMPARISON:  Ultrasound of June 09, 2018. FINDINGS: Lower chest: No acute abnormality. Hepatobiliary: No focal liver abnormality is seen. No gallstones, gallbladder wall thickening, or biliary dilatation. Pancreas: Unremarkable. No pancreatic ductal dilatation or surrounding inflammatory changes. Spleen: Normal in size without focal abnormality. Adrenals/Urinary Tract: Adrenal glands are  unremarkable. Kidneys are normal, without renal calculi, focal lesion, or hydronephrosis. Bladder is unremarkable. Stomach/Bowel: Stomach is within normal limits. Appendix appears normal. No evidence of bowel wall thickening, distention, or inflammatory changes. Vascular/Lymphatic: No significant vascular findings are present. No enlarged abdominal or pelvic lymph nodes. Reproductive: 3.9 cm uterine fibroid is noted. No adnexal abnormality is noted. Other: No abdominal wall hernia or abnormality. No abdominopelvic ascites. Musculoskeletal: No acute or significant osseous findings. IMPRESSION: 4 cm uterine fibroid is noted. No other definite abnormality seen in the abdomen or pelvis. Left renal abnormality noted on prior ultrasound is not visualized on this unenhanced study. Therefore, possible mass or neoplasm can not be excluded on the basis of this study. Further evaluation with MRI or follow-up ultrasound in 3 months is recommended. Electronically Signed   By: Marijo Conception, M.D.   On: 06/11/2018 10:02    Cardiac Studies   Lexiscan Myoview in progress.   Echo 05/25/18: Study Conclusions  - Left ventricle: The cavity size was mildly dilated. There was mild concentric hypertrophy. Systolic function was mildly reduced. The estimated ejection fraction was in the range of 45% to 50%. Diffuse hypokinesis. Features are consistent with a pseudonormal left ventricular filling pattern, with concomitant abnormal relaxation and increased filling pressure (grade 2 diastolic dysfunction). - Left atrium: The atrium was mildly dilated.   Patient Profile     37 y.o. female RA, CKD (diabetic glomerulosclerosis), diabetes, diabetic neuropathy, hypertension, hyperlipidemia, and GERD here with acute on chronic systolic and diastolic heart failure.   Assessment & Plan    Acute on chronic systolic and diastolic heart failure -LVEF 45-50%.  Patient has had no prior ischemic work-up.  She is  currently undergoing the second day of a 2-day nuclear stress test day. -Torsemide is being managed by nephrology, now 80 mg p.o. daily. -1.4 L urine output yesterday.  Weight is down another 1.5 kg -Volume status appears to be improving  Hypertension -Hydralazine was discontinued due to possible relationship to edema -Carvedilol was continued and increased to 25 mg twice daily yesterday. -Blood pressure is elevated  today.  Clonidine was added.  Hyperlipidemia -Continue rosuvastatin  For questions or updates, please contact [place] Please consult [email or web address] for contact info under        Signed, [name], NP  06/12/2018, 11:22 AM

## 2018-06-12 NOTE — Progress Notes (Signed)
PROGRESS NOTE  Carla Compton IOE:[id number] DOB: 1981/04/07 DOA: 06/05/2018 PCP: No primary care provider on file.  HPI/Recap of past 24 hours: Carla Compton a 37 y.o.femalewith medical history significant ofDM2, CKD stage 4, HTN. Patient was recently admitted to our service from Dec 1-7 with CHF exacerbation. 2d echo showed EF 18-29%, grade 2 diastolic dysfunction. Of concern creat was ranging between 3-3.98 during that admission. Patient was diuresed and discharged on torsemide 80m daily.  Patient came to ED at OSH for indigestion symptoms but was admitted for creat 4.34 (BNP 340 on admit).  Hospital course complicated by questionable right foot osteomyelitis incidentally found on right foot x-ray and on MRI.  No trauma or puncture wound.  Discussed with orthopedic surgery [name] suspects gout and will follow in the office post discharge within a week.  Started on colchicine 0.6 mg twice daily.  Allopurinol will be started outpatient in Dr. [name].   06/09/2018: Questionable right foot osteomyelitis.  DC IV antibiotics.   06/10/2018: Nephrology following and diuresing due to fluid overload. 06/11/18: Plan for ischemic work-up by cardiology.  06/12/18: Seen and examined with her sister her bedside.  No acute events overnight.  Second part of stress test planned today.  Uncontrolled hypertension.  Clonidine added by cardiology.   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 (GFR 15-29) (HCC)   CKD (chronic kidney disease) stage 4, GFR 15-29 ml/min (HCC)   Diabetic polyneuropathy associated with type 2 diabetes mellitus (HCC)   Severe protein-calorie malnutrition (HCC)   AKI (acute kidney injury) (HCC)   Swelling  Fluid overload suspect multifactorial secondary to worsening advanced renal failure versus  acute on chronic combined diastolic and systolic CHF Continue monitor urine output Continue strict I's and O's Torsemide 80 mg daily per nephrology  Uncontrolled hypertension Continue torsemide 80 mg daily Start clonidine 0.1 mg twice daily per cardiology Continue to closely monitor blood pressure and other vital signs  AKI on CKD 4 Baseline appears to be 3.0 with GFR of 22 Continue to avoid nephrotoxic agent Creatinine 3.79 from 3.58 yesterday Repeat BMP in the morning Monitor urine output  Non-anion gap metabolic acidosis suspect secondary to advanced renal failure Bicarb 19 with worsening renal function Nephrology following  Probable complex cyst in the periphery of the left mid kidney measuring 2.4 cm CT abdomen pelvis without contrast ordered to further assess possible mass or neoplasm cannot be excluded.  Recommendation for further evaluation with MRI or follow-up ultrasound in 3 months.  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 DSharol Givenoutpatient within a week ABI negative  History of rheumatoid arthritis Self-reports receive injection every 8 weeks for rheumatoid arthritis Follow-up with  rheumatology  outpatient  Hyperkalemia, resolving Suspect secondary to advanced renal failure Treated with IV calcium gluconate 1 g once and 30 g of p.o. Kayexalate once  Left fifth toe wound, present admission Wound care specialist consulted.  Appreciate recommendations  Type 2 diabetes with hyperglycemia Last A1c 7.2 on 05/26/2018 Continue insulin sliding scale  Diabetes polyneuropathy ABI negative  Anemia of chronic disease Hemoglobin improving 8.1 from 7.9 from 7.8  No sign of overt bleeding Aranesp and ferric gluconate per nephrology Repeat CBC in the morning  Severe morbid obesity BMI 49 Recommend weight loss outpatient Healthy dieting and regular exercise  Resolving intermittent right ear pain unclear etiology No drainage Pain management in place   DVT prophylaxis: Heparin [place] 3 times daily Code Status: Full Family Communication:  Sister in the room.  All questions answered to her satisfaction. Disposition Plan:  Home when nephrology and general surgery signed off Consults called:  Nephrology and general surgery    Objective: Vitals:   06/12/18 1110 06/12/18 1112 06/12/18 1114 06/12/18 1238  BP: (!) 192/88 (!) 160/90 (!) 169/94 (!) 154/89  Pulse: 99 (!) 101 99 86  Resp:      Temp:    98 F (36.7 C)  TempSrc:    Oral  SpO2:    96%  Weight:      Height:        Intake/Output Summary (Last 24 hours) at 06/12/2018 1403 Last data filed at 06/12/2018 0910 Gross per 24 hour  Intake 606 ml  Output 800 ml  Net -194 ml   Filed Weights   06/09/18 0554 06/10/18 0119 06/11/18 0300  Weight: 122.7 kg 121.4 kg 119.8 kg    Exam:  . General: 37 y.o. year-old female well-developed well-nourished in no acute distress.  Alert and oriented x3. . Cardiovascular: Regular rate and rhythm with no rubs or gallops.  No JVD or thyromegaly noted..   . Respiratory: Clear to auscultation with no wheezes or rales. Good inspiratory effort. . Abdomen: Soft nontender nondistended with normal  bowel sounds x4 quadrants. . Musculoskeletal: 1+ pitting edema in lower extremities. . Skin: Right lateral foot blister with swelling.  Left fifth toe wound. Marland Kitchen Psychiatry: Mood is appropriate for condition and setting   Data Reviewed: CBC: Recent Labs  Lab 06/05/18 2335 06/08/18 0423 06/09/18 1003 06/10/18 0502  WBC 5.7 5.0 4.5 4.4  NEUTROABS 3.4 2.6 2.0 1.9  HGB 8.4* 7.8* 7.9* 8.1*  HCT 27.2* 26.1* 26.2* 25.2*  MCV 91.0 91.6 91.0 91.0  PLT 308 343 325 709   Basic Metabolic Panel: Recent Labs  Lab 06/08/18 0423 06/08/18 1429 06/09/18 0514 06/10/18 0502 06/11/18 0339 06/12/18 0553  NA 137  --  138 139 140 139  K 5.4* 4.9 4.7 4.6 4.4 4.1  CL 110  --  112* 111 111 110  CO2 17*  --  15* 20* 20* 19*  GLUCOSE 132*  --  104* 81 89 79  BUN 35*  --  31* 29* 29* 28*  CREATININE 3.98*  --  3.83* 3.86* 3.58* 3.79*  CALCIUM 8.0*  --  8.1* 8.3* 8.3* 8.3*  PHOS  --   --  6.2*  --   --  5.9*   GFR: Estimated Creatinine Clearance: 25.5 mL/min (A) (by C-G formula based on SCr of 3.79 mg/dL (H)). Liver Function Tests: Recent Labs  Lab 06/05/18 2335 06/12/18 0553  AST 14*  --   ALT 11  --   ALKPHOS 72  --  BILITOT 0.6  --   PROT 6.2*  --   ALBUMIN 2.1* 2.2*   No results for input(s): LIPASE, AMYLASE in the last 168 hours. No results for input(s): AMMONIA in the last 168 hours. Coagulation Profile: No results for input(s): INR, PROTIME in the last 168 hours. Cardiac Enzymes: No results for input(s): CKTOTAL, CKMB, CKMBINDEX, TROPONINI in the last 168 hours. BNP (last 3 results) No results for input(s): PROBNP in the last 8760 hours. HbA1C: No results for input(s): HGBA1C in the last 72 hours. CBG: Recent Labs  Lab 06/11/18 1124 06/11/18 1635 06/11/18 2115 06/12/18 0727 06/12/18 1235  GLUCAP 86 98 96 72 124*   Lipid Profile: No results for input(s): CHOL, HDL, LDLCALC, TRIG, CHOLHDL, LDLDIRECT in the last 72 hours. Thyroid Function Tests: No results for  input(s): TSH, T4TOTAL, FREET4, T3FREE, THYROIDAB in the last 72 hours. Anemia Panel: No results for input(s): VITAMINB12, FOLATE, FERRITIN, TIBC, IRON, RETICCTPCT in the last 72 hours. Urine analysis: No results found for: COLORURINE, APPEARANCEUR, LABSPEC, PHURINE, GLUCOSEU, HGBUR, BILIRUBINUR, KETONESUR, PROTEINUR, UROBILINOGEN, NITRITE, LEUKOCYTESUR Sepsis Labs: _0 (procalcitonin:4,lacticidven:4)  )No results found for this or any previous visit (from the past 240 hour(s)).    Studies: No results found.  Scheduled Meds: . regadenoson      . calcitRIOL  0.25 mcg Oral Once per day on Mon Wed Fri  . carvedilol  25 mg Oral BID WC  . cloNIDine  0.1 mg Oral BID  . darbepoetin (ARANESP) injection - NON-DIALYSIS  200 mcg Subcutaneous Q Tue-1800  . heparin  5,000 Units Subcutaneous Q8H  . insulin aspart  0-5 Units Subcutaneous QHS  . insulin aspart  0-9 Units Subcutaneous TID WC  . rosuvastatin  40 mg Oral QHS  . senna-docusate  1 tablet Oral BID  . sodium bicarbonate  650 mg Oral BID  . sodium chloride flush  3 mL Intravenous Q12H  . torsemide  80 mg Oral Daily    Continuous Infusions: . sodium chloride Stopped (06/08/18 0826)  . ferric gluconate (FERRLECIT/NULECIT) IV 250 mg (06/12/18 1334)     LOS: 7 days     Kayleen Memos, MD [facility] Pager [phone]  If 7PM-7AM, please contact night-coverage [email or web address] Password [email or web address] 06/12/2018, 2:03 PM

## 2018-06-12 NOTE — Progress Notes (Signed)
A litlle short of breath

## 2018-06-13 LAB — CBC
HCT: 29.6 % — ABNORMAL LOW (ref 36.0–46.0)
Hemoglobin: 9.1 g/dL — ABNORMAL LOW (ref 12.0–15.0)
MCH: 27.5 pg (ref 26.0–34.0)
MCHC: 30.7 g/dL (ref 30.0–36.0)
MCV: 89.4 fL (ref 80.0–100.0)
Platelets: 311 10*3/uL (ref 150–400)
RBC: 3.31 MIL/uL — ABNORMAL LOW (ref 3.87–5.11)
RDW: 13.9 % (ref 11.5–15.5)
WBC: 5.5 10*3/uL (ref 4.0–10.5)
nRBC: 0.4 % — ABNORMAL HIGH (ref 0.0–0.2)

## 2018-06-13 LAB — GLUCOSE, CAPILLARY
GLUCOSE-CAPILLARY: 119 mg/dL — AB (ref 70–99)
Glucose-Capillary: 99 mg/dL (ref 70–99)
Glucose-Capillary: 123 mg/dL — ABNORMAL HIGH (ref 70–99)

## 2018-06-13 LAB — RENAL FUNCTION PANEL
Albumin: 2.3 g/dL — ABNORMAL LOW (ref 3.5–5.0)
Anion gap: 12 (ref 5–15)
BUN: 29 mg/dL — ABNORMAL HIGH (ref 6–20)
CALCIUM: 8.4 mg/dL — AB (ref 8.9–10.3)
CO2: 19 mmol/L — AB (ref 22–32)
CREATININE: 3.69 mg/dL — AB (ref 0.44–1.00)
Chloride: 109 mmol/L (ref 98–111)
GFR calc non Af Amer: 15 mL/min — ABNORMAL LOW (ref >60)
GFR, EST AFRICAN AMERICAN: 17 mL/min — AB (ref >60)
Glucose, Bld: 102 mg/dL — ABNORMAL HIGH (ref 70–99)
Phosphorus: 6.1 mg/dL — ABNORMAL HIGH (ref 2.5–4.6)
Potassium: 3.7 mmol/L (ref 3.5–5.1)
Sodium: 140 mmol/L (ref 135–145)

## 2018-06-13 MED ORDER — HYDRALAZINE HCL 25 MG PO TABS
25.0000 mg | ORAL_TABLET | Freq: Three times a day (TID) | ORAL | Status: DC
  Administered 2018-06-13 – 2018-06-14 (×5): 25 mg via ORAL
  Filled 2018-06-13 (×5): qty 1

## 2018-06-13 MED ORDER — ASPIRIN EC 81 MG PO TBEC
81.0000 mg | DELAYED_RELEASE_TABLET | Freq: Every day | ORAL | Status: DC
  Administered 2018-06-13 – 2018-06-14 (×2): 81 mg via ORAL
  Filled 2018-06-13 (×2): qty 1

## 2018-06-13 NOTE — Progress Notes (Signed)
PROGRESS NOTE  Carla Compton NID:[id number] DOB: 12/16/1980 DOA: 06/05/2018 PCP: No primary care provider on file.  HPI/Recap of past 24 hours: Carla Abdullahis a 37 y.o.femalewith medical history significant ofDM2, CKD stage 4, HTN. Patient was recently admitted to our service from Dec 1-7 with CHF exacerbation. 2d echo showed EF 14-43%, grade 2 diastolic dysfunction. Of concern creat was ranging between 3-3.98 during that admission. Patient was diuresed and discharged on torsemide 40m daily.  Patient came to ED at OSH for indigestion symptoms but was admitted for creat 4.34 (BNP 340 on admit).  Hospital course complicated by questionable right foot osteomyelitis incidentally found on right foot x-ray and on MRI.  No trauma or puncture wound.  Discussed with orthopedic surgery [name] suspects gout and will follow in the office post discharge within a week.  Started on colchicine 0.6 mg twice daily.  Allopurinol will be started outpatient in Dr. [name].   06/09/2018: Questionable right foot osteomyelitis.  DC IV antibiotics.   06/10/2018: Nephrology following and diuresing due to fluid overload. 06/11/18: Plan for ischemic work-up by cardiology. 06/12/18: Second part of stress test today revealed moderate sized moderate ischemia in the inferior, inferior lateral and anterolateral walls with an EF of 54%.  Uncontrolled hypertension.    06/13/2018: Patient seen and examined with her sister at bedside.  No acute events overnight.  She has no new complaints.  Blood pressure remains uncontrolled.  Cardiology and nephrology adjusting medications.  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 (GFR 15-29) (HCC)   CKD (chronic kidney disease) stage 4, GFR 15-29 ml/min (HCC)   Diabetic polyneuropathy associated with  type 2 diabetes mellitus (HCC)   Severe protein-calorie malnutrition (HCC)   AKI (acute kidney injury) (HCC)   Swelling  Fluid overload suspect multifactorial secondary to worsening advanced renal failure versus acute on chronic combined diastolic and systolic CHF Continue monitor urine output Continue strict I's and O's Torsemide 80 mg daily per nephrology  Uncontrolled hypertension Continue torsemide 80 mg daily Hydralazine 25 mg 3 times daily Coreg 25 mg twice daily Continue to monitor vital signs  AKI on CKD 4 Baseline appears to be 35.0 with GFR of 22 Continue to avoid nephrotoxic agent Creatinine 3.69 from 3.79 Repeat BMP in the morning Monitor urine output  Non-anion gap metabolic acidosis suspect secondary to advanced renal failure Bicarb 19 with worsening renal function Nephrology following  Probable complex cyst in the periphery of the left mid kidney measuring 2.4 cm CT abdomen pelvis without contrast ordered to further assess possible mass or neoplasm cannot be excluded.  Recommendation for further evaluation with MRI or follow-up ultrasound in 3 months.  Acute on chronic combined diastolic and systolic CHF Last 2D echo done on 05/25/2018 revealed LVEF 45 to 50% with diffuse hypokinesis and grade 2 diastolic dysfunction BNP greater than 300 Continue strict I's and O's and daily weight Continue cardiac medications Abnormal ischemic work-up.  Not a candidate for heart cath due to poor renal function and absence of high risk cardiac findings per cardiology.  Hyperphosphatemia PTH 45 Management by nephrology  Non-anion gap metabolic acidosis suspect secondary to renal failure Chemistry bicarb 19 and creatinine of 3.69 with a uric acid of 8.7 Continue sodium bicarb Repeat chemistry panel in the morning  Questionable right foot osteomyelitis X-ray right foot revealed possible osteomyelitis involving third through fifth metatarsals  lateral cuneiform and cuboid  confirmed by MRI CRP 14 and ESR 135  DC IV vancomycin and IV cefepime empirically  Follow-up with [name] outpatient within a week ABI negative  History of rheumatoid arthritis Self-reports receive injection every 8 weeks for rheumatoid arthritis Follow-up with rheumatology outpatient  Hyperkalemia, resolving Suspect secondary to advanced renal failure Treated with IV calcium gluconate 1 g once and 30 g of p.o. Kayexalate once  Left fifth toe wound, present admission Wound care specialist consulted.  Appreciate recommendations  Type 2 diabetes with hyperglycemia Last A1c 7.2 on 05/26/2018 Continue insulin sliding scale  Diabetes polyneuropathy ABI negative  Anemia of chronic disease Hemoglobin stable at 9.1  No sign of overt bleeding Aranesp and ferric gluconate per nephrology Repeat CBC in the morning  Severe morbid obesity BMI 47 Recommend weight loss outpatient Healthy dieting and regular exercise  Resolving intermittent right ear pain unclear etiology No drainage Pain management in place   DVT prophylaxis: Heparin [name] 3 times daily Code Status: Full Family Communication:  Sister in the room.  All questions answered to her satisfaction. Disposition Plan:  Home when nephrology and general surgery signed off Consults called:  Nephrology and general surgery    Objective: Vitals:   06/12/18 2110 06/13/18 0526 06/13/18 0854 06/13/18 1249  BP: (!) 148/86 (!) 156/83 (!) 179/99 (!) 150/89  Pulse:  87 83   Resp:  18    Temp:  98.3 F (36.8 C)    TempSrc:  Oral    SpO2:  100%    Weight:  117 kg    Height:        Intake/Output Summary (Last 24 hours) at 06/13/2018 1354 Last data filed at 06/13/2018 1300 Gross per 24 hour  Intake 927 ml  Output 1000 ml  Net -73 ml   Filed Weights   06/10/18 0119 06/11/18 0300 06/13/18 0526  Weight: 121.4 kg 119.8 kg 117 kg    Exam:  . General: 37 y.o. year-old female well-developed well-nourished no acute distress.   Alert oriented x3.  . Cardiovascular: Regular rate and rhythm with no rubs or gallops.  No JVD or thyromegaly noted..   . Respiratory: Clear to station with no wheezes or rales.  Good inspiratory effort. . Abdomen: Soft nontender nondistended with normal bowel sounds x4 quadrants. . Musculoskeletal: 1+ pitting edema in lower extremities. . Skin: Right lateral foot blister with swelling.  Left fifth toe wound. Marland Kitchen Psychiatry: Mood is appropriate for condition and setting   Data Reviewed: CBC: Recent Labs  Lab 06/08/18 0423 06/09/18 1003 06/10/18 0502 06/13/18 0724  WBC 5.0 4.5 4.4 5.5  NEUTROABS 2.6 2.0 1.9  --   HGB 7.8* 7.9* 8.1* 9.1*  HCT 26.1* 26.2* 25.2* 29.6*  MCV 91.6 91.0 91.0 89.4  PLT 343 325 334 893   Basic Metabolic Panel: Recent Labs  Lab 06/09/18 0514 06/10/18 0502 06/11/18 0339 06/12/18 0553 06/13/18 0438  NA 138 139 140 139 140  K 4.7 4.6 4.4 4.1 3.7  CL 112* 111 111 110 109  CO2 15* 20* 20* 19* 19*  GLUCOSE 104* 81 89 79 102*  BUN 31* 29* 29* 28* 29*  CREATININE 3.83* 3.86* 3.58* 3.79* 3.69*  CALCIUM 8.1* 8.3* 8.3* 8.3* 8.4*  PHOS 6.2*  --   --  5.9* 6.1*   GFR: Estimated Creatinine Clearance: 25.8 mL/min (A) (by C-G formula based on SCr of 3.69 mg/dL (H)). Liver Function Tests: Recent Labs  Lab 06/12/18 0553 06/13/18 0438  ALBUMIN  2.2* 2.3*   No results for input(s): LIPASE, AMYLASE in the last 168 hours. No results for input(s): AMMONIA in the last 168 hours. Coagulation Profile: No results for input(s): INR, PROTIME in the last 168 hours. Cardiac Enzymes: No results for input(s): CKTOTAL, CKMB, CKMBINDEX, TROPONINI in the last 168 hours. BNP (last 3 results) No results for input(s): PROBNP in the last 8760 hours. HbA1C: No results for input(s): HGBA1C in the last 72 hours. CBG: Recent Labs  Lab 06/12/18 0727 06/12/18 1235 06/12/18 1654 06/12/18 2137 06/13/18 0739  GLUCAP 72 124* 111* 113* 99   Lipid Profile: No results for  input(s): CHOL, HDL, LDLCALC, TRIG, CHOLHDL, LDLDIRECT in the last 72 hours. Thyroid Function Tests: No results for input(s): TSH, T4TOTAL, FREET4, T3FREE, THYROIDAB in the last 72 hours. Anemia Panel: No results for input(s): VITAMINB12, FOLATE, FERRITIN, TIBC, IRON, RETICCTPCT in the last 72 hours. Urine analysis: No results found for: COLORURINE, APPEARANCEUR, LABSPEC, PHURINE, GLUCOSEU, HGBUR, BILIRUBINUR, KETONESUR, PROTEINUR, UROBILINOGEN, NITRITE, LEUKOCYTESUR Sepsis Labs: '@LABRCNTIP' (procalcitonin:4,lacticidven:4)  )No results found for this or any previous visit (from the past 240 hour(s)).    Studies: No results found.  Scheduled Meds: . aspirin EC  81 mg Oral Daily  . calcitRIOL  0.25 mcg Oral Once per day on Mon Wed Fri  . carvedilol  25 mg Oral BID WC  . darbepoetin (ARANESP) injection - NON-DIALYSIS  200 mcg Subcutaneous Q Tue-1800  . heparin  5,000 Units Subcutaneous Q8H  . hydrALAZINE  25 mg Oral Q8H  . insulin aspart  0-5 Units Subcutaneous QHS  . insulin aspart  0-9 Units Subcutaneous TID WC  . rosuvastatin  40 mg Oral QHS  . senna-docusate  1 tablet Oral BID  . sodium bicarbonate  650 mg Oral BID  . sodium chloride flush  3 mL Intravenous Q12H  . torsemide  80 mg Oral Daily    Continuous Infusions: . sodium chloride Stopped (06/08/18 0826)     LOS: 8 days     Kayleen Memos, MD [facility] Pager [phone]  If 7PM-7AM, please contact night-coverage [email or web address] Password [email or web address] 06/13/2018, 1:54 PM

## 2018-06-13 NOTE — Progress Notes (Signed)
Subjective:  Not all UOP recorded-  Weight down- crt really stable - not uremic Objective Vital signs in last 24 hours: Vitals:   06/12/18 1937 06/12/18 2110 06/13/18 0526 06/13/18 0854  BP: (!) 158/95 (!) 148/86 (!) 156/83 (!) 179/99  Pulse: 81  87 83  Resp: 18  18   Temp: 97.6 F (36.4 C)  98.3 F (36.8 C)   TempSrc: Oral  Oral   SpO2: 100%  100%   Weight:   117 kg   Height:       Weight change:   Intake/Output Summary (Last 24 hours) at 06/13/2018 6389 Last data filed at 06/13/2018 0531 Gross per 24 hour  Intake 447 ml  Output 400 ml  Net 47 ml    Assessment/Plan: 37 year old black female with biopsy-proven diabetic glomerulosclerosis with nephrotic syndrome who has had rocky course of late with episodes of AKI and volume overload 1.Renal-patient appears to have severe diabetic glomerulosclerosis.  As a result she has had fairly rapidly worsening renal function.  I suspect her rising creatinine with diuresis is just an unmasking of what her true GFR is and not necessarily worsening of it-in any event creatinine has been fairly stable albeit poor.  There are no indications for dialysis.    ? Renal mass- CT did not show, rec repeat U/S in 3 mos-   2. Hypertension/volume  -she is volume overloaded.  I  increased her diuretics to torsemide 80 daily- and stopped her hydralazine as that can also cause edema- weight is coming down- continue as dont feel is dry.  I really dont like clonidine in a young pt- I almost would rather have hydralazine back- will add as she has that at home 3.  Osteomyelitis-new diagnosis but now in question. Now ortho thinking charcot joint vs gout- investigation ongoing.  Abx stopped, follow up with Dr Duda 4. Anemia  -this is not helping her third spacing situation.   iron stores low, repleting and have started an ESA- hgb over 9 today ? 5.  Bones-PTH 45.  I see that she is already on calcitriol likely from her nephrologist in [place] 5.9- no action  yet  6.  Hyperkalemia- had been on supplementation in the past.   I suspect it may go down with diuresis- stable today  7. Metabolic acidosis- giving bicarb 8. Dispo- renal is OK with discharge- on torsemide 80 mg PO daily and hydralazine 25 TID with up to 50 if needed as well as coreg 25 BID- still anticipate as volume status improves BP will improve.  She now expresses wish to follow up with me- I will arrange- she will keep track of BP for me  [place]: Basic Metabolic Panel: Recent Labs  Lab 06/09/18 0514  06/11/18 0339 06/12/18 0553 06/13/18 0438  NA 138   < > 140 139 140  K 4.7   < > 4.4 4.1 3.7  CL 112*   < > 111 110 109  CO2 15*   < > 20* 19* 19*  GLUCOSE 104*   < > 89 79 102*  BUN 31*   < > 29* 28* 29*  CREATININE 3.83*   < > 3.58* 3.79* 3.69*  CALCIUM 8.1*   < > 8.3* 8.3* 8.4*  PHOS 6.2*  --   --  5.9* 6.1*   < > = values in this interval not displayed.   Liver Function Tests: Recent Labs  Lab 06/12/18 [phone] 06/13/18 [phone]  ALBUMIN 2.2* 2.3*   No results for input(s): LIPASE, AMYLASE in the last 168 hours. No results for input(s): AMMONIA in the last 168 hours. CBC: Recent Labs  Lab 06/08/18 0423 06/09/18 1003 06/10/18 0502 06/13/18 0724  WBC 5.0 4.5 4.4 5.5  NEUTROABS 2.6 2.0 1.9  --   HGB 7.8* 7.9* 8.1* 9.1*  HCT 26.1* 26.2* 25.2* 29.6*  MCV 91.6 91.0 91.0 89.4  PLT 343 325 334 311   Cardiac Enzymes: No results for input(s): CKTOTAL, CKMB, CKMBINDEX, TROPONINI in the last 168 hours. CBG: Recent Labs  Lab 06/12/18 0727 06/12/18 1235 06/12/18 1654 06/12/18 2137 06/13/18 0739  GLUCAP 72 124* 111* 113* 99    Iron Studies:  No results for input(s): IRON, TIBC, TRANSFERRIN, FERRITIN in the last 72 hours. Studies/Results: Nm Myocar Multi W/spect W/wall Motion / Ef  Result Date: 06/12/2018  Moderate sized region of moderate ischemia in the inferiorI (base, mid), inferolateral(base, mid) and anterolateral (base) walls.   Nuclear stress EF: 54%.  This is an intermediate risk study.    Medications: Infusions: . sodium chloride Stopped (06/08/18 0826)    Scheduled Medications: . aspirin EC  81 mg Oral Daily  . calcitRIOL  0.25 mcg Oral Once per day on Mon Wed Fri  . carvedilol  25 mg Oral BID WC  . cloNIDine  0.1 mg Oral BID  . darbepoetin (ARANESP) injection - NON-DIALYSIS  200 mcg Subcutaneous Q Tue-1800  . heparin  5,000 Units Subcutaneous Q8H  . insulin aspart  0-5 Units Subcutaneous QHS  . insulin aspart  0-9 Units Subcutaneous TID WC  . rosuvastatin  40 mg Oral QHS  . senna-docusate  1 tablet Oral BID  . sodium bicarbonate  650 mg Oral BID  . sodium chloride flush  3 mL Intravenous Q12H  . torsemide  80 mg Oral Daily    have reviewed scheduled and prn medications.  Physical Exam: General: NAD- obese and limited mobility  Heart: RRR Lungs: dec BS at bases Abdomen: obese, soft, non tender Extremities: pitting edema still    06/13/2018,9:26 AM  LOS: 8 days

## 2018-06-13 NOTE — Progress Notes (Signed)
Progress Note  Patient Name: Carla Compton Date of Encounter: 06/13/2018  Primary Cardiologist: Establish with Dr Oval Linsey  Subjective   SOB improving  Inpatient Medications    Scheduled Meds: . calcitRIOL  0.25 mcg Oral Once per day on Mon Wed Fri  . carvedilol  25 mg Oral BID WC  . cloNIDine  0.1 mg Oral BID  . darbepoetin (ARANESP) injection - NON-DIALYSIS  200 mcg Subcutaneous Q Tue-1800  . heparin  5,000 Units Subcutaneous Q8H  . insulin aspart  0-5 Units Subcutaneous QHS  . insulin aspart  0-9 Units Subcutaneous TID WC  . rosuvastatin  40 mg Oral QHS  . senna-docusate  1 tablet Oral BID  . sodium bicarbonate  650 mg Oral BID  . sodium chloride flush  3 mL Intravenous Q12H  . torsemide  80 mg Oral Daily   Continuous Infusions: . sodium chloride Stopped (06/08/18 0826)   PRN Meds: sodium chloride, acetaminophen, albuterol, ondansetron (ZOFRAN) IV, oxyCODONE, sodium chloride flush   Vital Signs    Vitals:   06/12/18 1643 06/12/18 1937 06/12/18 2110 06/13/18 0526  BP: (!) 159/99 (!) 158/95 (!) 148/86 (!) 156/83  Pulse: 83 81  87  Resp:  18  18  Temp:  97.6 F (36.4 C)  98.3 F (36.8 C)  TempSrc:  Oral  Oral  SpO2:  100%  100%  Weight:    117 kg  Height:        Intake/Output Summary (Last 24 hours) at 06/13/2018 0749 Last data filed at 06/13/2018 0531 Gross per 24 hour  Intake 570 ml  Output 400 ml  Net 170 ml   Filed Weights   06/10/18 0119 06/11/18 0300 06/13/18 0526  Weight: 121.4 kg 119.8 kg 117 kg    Telemetry    SR - Personally Reviewed  ECG    na  Physical Exam   GEN: No acute distress.   Neck: No JVD Cardiac: RRR, no murmurs, rubs, or gallops.  Respiratory: Clear to auscultation bilaterally. GI: Soft, nontender, non-distended  MS: No edema; No deformity. Neuro:  Nonfocal  Psych: Normal affect   Labs    Chemistry Recent Labs  Lab 06/11/18 0339 06/12/18 0553 06/13/18 0438  NA 140 139 140  K 4.4 4.1 3.7  CL 111  110 109  CO2 20* 19* 19*  GLUCOSE 89 79 102*  BUN 29* 28* 29*  CREATININE 3.58* 3.79* 3.69*  CALCIUM 8.3* 8.3* 8.4*  ALBUMIN  --  2.2* 2.3*  GFRNONAA 16* 15* 15*  GFRAA 18* 17* 17*  ANIONGAP 9 10 12      Hematology Recent Labs  Lab 06/09/18 1003 06/10/18 0502 06/13/18 0724  WBC 4.5 4.4 5.5  RBC 2.88* 2.77* 3.31*  HGB 7.9* 8.1* 9.1*  HCT 26.2* 25.2* 29.6*  MCV 91.0 91.0 89.4  MCH 27.4 29.2 27.5  MCHC 30.2 32.1 30.7  RDW 13.8 13.8 13.9  PLT 325 334 311    Cardiac EnzymesNo results for input(s): TROPONINI in the last 168 hours. No results for input(s): TROPIPOC in the last 168 hours.   BNPNo results for input(s): BNP, PROBNP in the last 168 hours.   DDimer No results for input(s): DDIMER in the last 168 hours.   Radiology    Ct Abdomen Pelvis Wo Contrast  Result Date: 06/11/2018 CLINICAL DATA:  Renal cyst. EXAM: CT ABDOMEN AND PELVIS WITHOUT CONTRAST TECHNIQUE: Multidetector CT imaging of the abdomen and pelvis was performed following the standard protocol without IV contrast. COMPARISON:  Ultrasound  of June 09, 2018. FINDINGS: Lower chest: No acute abnormality. Hepatobiliary: No focal liver abnormality is seen. No gallstones, gallbladder wall thickening, or biliary dilatation. Pancreas: Unremarkable. No pancreatic ductal dilatation or surrounding inflammatory changes. Spleen: Normal in size without focal abnormality. Adrenals/Urinary Tract: Adrenal glands are unremarkable. Kidneys are normal, without renal calculi, focal lesion, or hydronephrosis. Bladder is unremarkable. Stomach/Bowel: Stomach is within normal limits. Appendix appears normal. No evidence of bowel wall thickening, distention, or inflammatory changes. Vascular/Lymphatic: No significant vascular findings are present. No enlarged abdominal or pelvic lymph nodes. Reproductive: 3.9 cm uterine fibroid is noted. No adnexal abnormality is noted. Other: No abdominal wall hernia or abnormality. No abdominopelvic  ascites. Musculoskeletal: No acute or significant osseous findings. IMPRESSION: 4 cm uterine fibroid is noted. No other definite abnormality seen in the abdomen or pelvis. Left renal abnormality noted on prior ultrasound is not visualized on this unenhanced study. Therefore, possible mass or neoplasm can not be excluded on the basis of this study. Further evaluation with MRI or follow-up ultrasound in 3 months is recommended. Electronically Signed   By: Marijo Conception, M.D.   On: 06/11/2018 10:02   Nm Myocar Multi W/spect W/wall Motion / Ef  Result Date: 06/12/2018  Moderate sized region of moderate ischemia in the inferiorI (base, mid), inferolateral(base, mid) and anterolateral (base) walls.  Nuclear stress EF: 54%.  This is an intermediate risk study.     Cardiac Studies     Patient Profile     Carla Compton is a 37 y.o. female with a hx of seropositive erosive rheumatoid arthritis, CKD, poorly controlled diabetes with diabetic neuropathy, history of diabetic myonecrosis right thigh 2017, hypertension, hyperlipidemia, GERD and obesity who is being seen today for the evaluation of CHF at the request of Dr. [place] Crane.  Assessment & Plan    1. Acute on chronic combined systolic/diastolic HF - 51/7616 echo LVEF 45-50%, grade II diastolic dysfunction - presented with AKI, given IVFs and became volume overloaded - from notes off hydralazine due to concern it was contributing to edema - 05/2018 nuclear stresss for mild LV dysfunction showed moderate ischemia inferior, inferolateral, and anterolateral walls. Intermediate risk - not a cath candidate in setting of her poor renal function, particularly in absence of high risk cardiac findigns. Medical therapy for suspected ischemic heart disease with coreg, crestor. Start ASA 81mg  daily. No ACE/ARB due to poor renal function.  - defer diuretics to renal  2. AKI on CKD - followed by nephrology - from notes baseline Cr is 3.8   3. HTN -  limited options due to her poor renal function - coreg increased this admission. Started on clonidine 0.1mg  bid yesterday - from notes hydralazine may have contributed to edema  - follow trends today, may titrate cloidine further if needed    For questions or updates, please contact [place] Please consult [email or web address] for contact info under        Signed, Carlyle Dolly, MD  06/13/2018, 7:49 AM

## 2018-06-14 LAB — RENAL FUNCTION PANEL
Albumin: 2.2 g/dL — ABNORMAL LOW (ref 3.5–5.0)
Anion gap: 10 (ref 5–15)
BUN: 29 mg/dL — ABNORMAL HIGH (ref 6–20)
CO2: 22 mmol/L (ref 22–32)
Calcium: 8.2 mg/dL — ABNORMAL LOW (ref 8.9–10.3)
Chloride: 108 mmol/L (ref 98–111)
Creatinine, Ser: 4.24 mg/dL — ABNORMAL HIGH (ref 0.44–1.00)
GFR calc Af Amer: 15 mL/min — ABNORMAL LOW (ref >60)
GFR calc non Af Amer: 13 mL/min — ABNORMAL LOW (ref >60)
Glucose, Bld: 92 mg/dL (ref 70–99)
POTASSIUM: 3.6 mmol/L (ref 3.5–5.1)
Phosphorus: 5.9 mg/dL — ABNORMAL HIGH (ref 2.5–4.6)
Sodium: 140 mmol/L (ref 135–145)

## 2018-06-14 LAB — GLUCOSE, CAPILLARY
GLUCOSE-CAPILLARY: 118 mg/dL — AB (ref 70–99)
Glucose-Capillary: 89 mg/dL (ref 70–99)
Glucose-Capillary: 87 mg/dL (ref 70–99)

## 2018-06-14 MED ORDER — ASPIRIN 81 MG PO TBEC: 81.0000 mg | DELAYED_RELEASE_TABLET | Freq: Every day | ORAL | 0 refills | Status: AC

## 2018-06-14 MED ORDER — HYDRALAZINE HCL 25 MG PO TABS: 25.0000 mg | ORAL_TABLET | Freq: Three times a day (TID) | ORAL | 0 refills | Status: DC

## 2018-06-14 MED ORDER — CARVEDILOL 25 MG PO TABS: 25.0000 mg | ORAL_TABLET | Freq: Two times a day (BID) | ORAL | 0 refills | Status: AC

## 2018-06-14 MED ORDER — TORSEMIDE 20 MG PO TABS: 80.0000 mg | ORAL_TABLET | Freq: Every day | ORAL | 0 refills | Status: DC

## 2018-06-14 MED ORDER — SODIUM BICARBONATE 650 MG PO TABS: 650.0000 mg | ORAL_TABLET | Freq: Every day | ORAL | 0 refills | Status: DC

## 2018-06-14 NOTE — Progress Notes (Signed)
Discharge instructions given to patient and family members at bedside. All questions answered.

## 2018-06-14 NOTE — Discharge Instructions (Signed)
Acute Kidney Injury, Adult ° °Acute kidney injury is a sudden worsening of kidney function. The kidneys are organs that have several jobs. They filter the blood to remove waste products and extra fluid. They also maintain a healthy balance of minerals and hormones in the body, which helps control blood pressure and keep bones strong. With this condition, your kidneys do not do their jobs as well as they should. °This condition ranges from mild to severe. Over time it may develop into long-lasting (chronic) kidney disease. Early detection and treatment may prevent acute kidney injury from developing into a chronic condition. °What are the causes? °Common causes of this condition include: °· A problem with blood flow to the kidneys. This may be caused by: °? Low blood pressure (hypotension) or shock. °? Blood loss. °? Heart and blood vessel (cardiovascular) disease. °? Severe burns. °? Liver disease. °· Direct damage to the kidneys. This may be caused by: °? Certain medicines. °? A kidney infection. °? Poisoning. °? Being around or in contact with toxic substances. °? A surgical wound. °? A hard, direct hit to the kidney area. °· A sudden blockage of urine flow. This may be caused by: °? Cancer. °? Kidney stones. °? An enlarged prostate in males. °What are the signs or symptoms? °Symptoms of this condition may not be obvious until the condition becomes severe. Symptoms of this condition can include: °· Tiredness (lethargy), or difficulty staying awake. °· Nausea or vomiting. °· Swelling (edema) of the face, legs, ankles, or feet. °· Problems with urination, such as: °? Abdominal pain, or pain along the side of your stomach (flank). °? Decreased urine production. °? Decrease in the force of urine flow. °· Muscle twitches and cramps, especially in the legs. °· Confusion or trouble concentrating. °· Loss of appetite. °· Fever. °How is this diagnosed? °This condition may be diagnosed with tests, including: °· Blood  tests. °· Urine tests. °· Imaging tests. °· A test in which a sample of tissue is removed from the kidneys to be examined under a microscope (kidney biopsy). °How is this treated? °Treatment for this condition depends on the cause and how severe the condition is. In mild cases, treatment may not be needed. The kidneys may heal on their own. In more severe cases, treatment will involve: °· Treating the cause of the kidney injury. This may involve changing any medicines you are taking or adjusting your dosage. °· Fluids. You may need specialized IV fluids to balance your body's needs. °· Having a catheter placed to drain urine and prevent blockages. °· Preventing problems from occurring. This may mean avoiding certain medicines or procedures that can cause further injury to the kidneys. °In some cases treatment may also require: °· A procedure to remove toxic wastes from the body (dialysis or continuous renal replacement therapy - CRRT). °· Surgery. This may be done to repair a torn kidney, or to remove the blockage from the urinary system. °Follow these instructions at home: °Medicines °· Take over-the-counter and prescription medicines only as told by your health care provider. °· Do not take any new medicines without your health care provider's approval. Many medicines can worsen your kidney damage. °· Do not take any vitamin and mineral supplements without your health care provider's approval. Many nutritional supplements can worsen your kidney damage. °Lifestyle °· If your health care provider prescribed changes to your diet, follow them. You may need to decrease the amount of protein you eat. °· Achieve and maintain a healthy   weight. If you need help with this, ask your health care provider.  Start or continue an exercise plan. Try to exercise at least 30 minutes a day, 5 days a week.  Do not use any tobacco products, such as cigarettes, chewing tobacco, and e-cigarettes. If you need help quitting, ask your  health care provider. General instructions  Keep track of your blood pressure. Report changes in your blood pressure as told by your health care provider.  Stay up to date with immunizations. Ask your health care provider which immunizations you need.  Keep all follow-up visits as told by your health care provider. This is important. Where to find more information  American Association of Kidney Patients: BombTimer.gl  National Kidney Foundation: www.kidney.[place]: https://mathis.com/  [facility]: ? www.lifeoptions.org ? www.kidneyschool.org Contact a health care provider if:  Your symptoms get worse.  You develop new symptoms. Get help right away if:  You develop symptoms of worsening kidney disease, which include: ? Headaches. ? Abnormally dark or light skin. ? Easy bruising. ? Frequent hiccups. ? Chest pain. ? Shortness of breath. ? End of menstruation in women. ? Seizures. ? Confusion or altered mental status. ? Abdominal or back pain. ? Itchiness.  You have a fever.  Your body is producing less urine.  You have pain or bleeding when you urinate. Summary  Acute kidney injury is a sudden worsening of kidney function.  Acute kidney injury can be caused by problems with blood flow to the kidneys, direct damage to the kidneys, and sudden blockage of urine flow.  Symptoms of this condition may not be obvious until it becomes severe. Symptoms may include edema, lethargy, confusion, nausea or vomiting, and problems passing urine.  This condition can usually be diagnosed with blood tests, urine tests, and imaging tests. Sometimes a kidney biopsy is done to diagnose this condition.  Treatment for this condition often involves treating the underlying cause. It is treated with fluids, medicines, dialysis, diet changes, or surgery. This information is not intended to replace advice given to you by your health care provider. Make  sure you discuss any questions you have with your health care provider. Document Released: 12/24/2010 Document Revised: 10/10/2016 Document Reviewed: 05/31/2016 Elsevier Interactive Patient Education  2019 [employer]   Edema  Edema is when you have too much fluid in your body or under your skin. Edema may make your legs, feet, and ankles swell up. Swelling is also common in looser tissues, like around your eyes. This is a common condition. It gets more common as you get older. There are many possible causes of edema. Eating too much salt (sodium) and being on your feet or sitting for a long time can cause edema in your legs, feet, and ankles. Hot weather may make edema worse. Edema is usually painless. Your skin may look swollen or shiny. Follow these instructions at home:  Keep the swollen body part raised (elevated) above the level of your heart when you are sitting or lying down.  Do not sit still or stand for a long time.  Do not wear tight clothes. Do not wear garters on your upper legs.  Exercise your legs. This can help the swelling go down.  Wear elastic bandages or support stockings as told by your doctor.  Eat a low-salt (low-sodium) diet to reduce fluid as told by your doctor.  Depending on the cause of your swelling, you may need to limit how much fluid you  drink (fluid restriction).  Take over-the-counter and prescription medicines only as told by your doctor. Contact a doctor if:  Treatment is not working.  You have heart, liver, or kidney disease and have symptoms of edema.  You have sudden and unexplained weight gain. Get help right away if:  You have shortness of breath or chest pain.  You cannot breathe when you lie down.  You have pain, redness, or warmth in the swollen areas.  You have heart, liver, or kidney disease and get edema all of a sudden.  You have a fever and your symptoms get worse all of a sudden. Summary  Edema is when you have too  much fluid in your body or under your skin.  Edema may make your legs, feet, and ankles swell up. Swelling is also common in looser tissues, like around your eyes.  Raise (elevate) the swollen body part above the level of your heart when you are sitting or lying down.  Follow your doctor's instructions about diet and how much fluid you can drink (fluid restriction). This information is not intended to replace advice given to you by your health care provider. Make sure you discuss any questions you have with your health care provider. Document Released: 11/27/2007 Document Revised: 06/28/2016 Document Reviewed: 06/28/2016 Elsevier Interactive Patient Education  2019 [place].   Heart Failure  Heart failure means your heart has trouble pumping blood. This makes it hard for your body to work well. Heart failure is usually a long-term (chronic) condition. You must take good care of yourself and follow your treatment plan from your doctor. Follow these instructions at home: Medicines  Take over-the-counter and prescription medicines only as told by your doctor. ? Do not stop taking your medicine unless your doctor told you to do that. ? Do not skip any doses. ? Refill your prescriptions before you run out of medicine. You need your medicines every day. Eating and drinking   Eat heart-healthy foods. Talk with a diet and nutrition specialist (dietitian) to make an eating plan.  Choose foods that: ? Have no trans fat. ? Are low in saturated fat and cholesterol.  Choose healthy foods, like: ? Fresh or frozen fruits and vegetables. ? Fish. ? Low-fat (lean) meats. ? Legumes (like beans, peas, and lentils). ? Fat-free or low-fat dairy products. ? Whole-grain foods. ? High-fiber foods.  Limit salt (sodium) if told by your doctor. Ask your nutrition specialist to recommend heart-healthy seasonings.  Cook in healthy ways instead of frying. Healthy ways of cooking  include: ? Roasting. ? Grilling. ? Broiling. ? Baking. ? Poaching. ? Steaming. ? Stir-frying.  Limit how much fluid you drink, if told by your doctor. Lifestyle  Do not smoke or use chewing tobacco. Do not use nicotine gum or patches before talking to your doctor.  Limit alcohol intake to no more than 1 drink a day for non-pregnant women and 2 drinks a day for men. One drink equals 12 oz of beer, 5 oz of wine, or 1 oz of hard liquor. ? Tell your doctor if you drink alcohol many times a week. ? Talk with your doctor about whether any alcohol is safe for you. ? You should stop drinking alcohol: ? If your heart has been damaged by alcohol. ? You have very bad heart failure.  Do not use illegal drugs.  Lose weight if told by your doctor.  Do moderate physical activity if told by your doctor. Ask your doctor what activities are  safe for you if: ? You are of older age (elderly). ? You have very bad heart failure. Keep track of important information  Weigh yourself every day. ? Weigh yourself every morning after you pee (urinate) and before breakfast. ? Wear the same amount of clothing each time. ? Write down your daily weight. Give your record to your doctor.  Check and write down your blood pressure as told by your doctor.  Check your pulse as told by your doctor. Dealing with heat and cold  If the weather is very hot: ? Avoid activity that takes a lot of energy. ? Use air conditioning or fans, or find a cooler place. ? Avoid caffeine. ? Avoid alcohol. ? Wear clothing that is loose-fitting, lightweight, and light-colored.  If the weather is very cold: ? Avoid activity that takes a lot of energy. ? Layer your clothes. ? Wear mittens or gloves, a hat, and a scarf when you go outside. ? Avoid alcohol. General instructions  Manage other conditions that you have as told by your doctor.  Learn to manage stress. If you need help, ask your doctor.  Plan rest periods for  when you get tired.  Get education and support as needed.  Get rehab (rehabilitation) to help you stay independent and to help with everyday tasks.  Stay up to date with shots (immunizations), especially pneumococcal and flu (influenza) shots.  Keep all follow-up visits as told by your doctor. This is important. Contact a doctor if:  You gain weight quickly.  You are more short of breath than normal.  You cannot do your normal activities.  You tire easily.  You cough more than normal, especially with activity.  You have any or more puffiness (swelling) in areas such as your hands, feet, ankles, or belly (abdomen).  You cannot sleep because it is hard to breathe.  You feel like your heart is beating fast (palpitations).  You get dizzy or light-headed when you stand up. Get help right away if:  You have trouble breathing.  You or someone else notices a change in your awareness. This could be trouble staying awake or trouble concentrating.  You have chest pain or discomfort.  You pass out (faint). Summary  Heart failure means your heart has trouble pumping blood.  Make sure you refill your prescriptions before you run out of medicine. You need your medicines every day.  Keep records of your weight and blood pressure to give to your doctor.  Contact a doctor if you gain weight quickly. This information is not intended to replace advice given to you by your health care provider. Make sure you discuss any questions you have with your health care provider. Document Released: 03/19/2008 Document Revised: 03/04/2018 Document Reviewed: 07/02/2016 Elsevier Interactive Patient Education  2019 [place].   Heart Failure Action Plan A heart failure action plan helps you understand what to do when you have symptoms of heart failure. Follow the plan that was created by you and your health care provider. Review your plan each time you visit your health care provider. Red  zone These signs and symptoms mean you should get medical help right away:  You have trouble breathing when resting.  You have a dry cough that is getting worse.  You have swelling or pain in your legs or abdomen that is getting worse.  You suddenly gain more than 2-3 lb (0.9-1.4 kg) in a day, or more than 5 lb (2.3 kg) in one week. This amount  may be more or less depending on your condition.  You have trouble staying awake or you feel confused.  You have chest pain.  You do not have an appetite.  You pass out. If you experience any of these symptoms:  Call your local emergency services (911 in the [place] right away or seek help at the emergency department of the nearest hospital. Yellow zone These signs and symptoms mean your condition may be getting worse and you should make some changes:  You have trouble breathing when you are active or you need to sleep with extra pillows.  You have swelling in your legs or abdomen.  You gain 2-3 lb (0.9-1.4 kg) in one day, or 5 lb (2.3 kg) in one week. This amount may be more or less depending on your condition.  You get tired easily.  You have trouble sleeping.  You have a dry cough. If you experience any of these symptoms:  Contact your health care provider within the next day.  Your health care provider may adjust your medicines. Green zone These signs mean you are doing well and can continue what you are doing:  You do not have shortness of breath.  You have very little swelling or no new swelling.  Your weight is stable (no gain or loss).  You have a normal activity level.  You do not have chest pain or any other new symptoms. Follow these instructions at home:  Take over-the-counter and prescription medicines only as told by your health care provider.  Weigh yourself daily. Your target weight is __________ lb (__________ kg). ? Call your health care provider if you gain more than __________ lb (__________ kg) in a  day, or more than __________ lb (__________ kg) in one week.  Eat a heart-healthy diet. Work with a diet and nutrition specialist (dietitian) to create an eating plan that is best for you.  Keep all follow-up visits as told by your health care provider. This is important. Where to find more information  American Heart Association: www.heart.org Summary  Follow the action plan that was created by you and your health care provider.  Get help right away if you have any symptoms in the Red zone. This information is not intended to replace advice given to you by your health care provider. Make sure you discuss any questions you have with your health care provider. Document Released: 07/20/2016 Document Revised: 02/12/2017 Document Reviewed: 07/20/2016 Elsevier Interactive Patient Education  2019 Else[employer] Inc.   Heart Failure Exacerbation  Heart failure is a condition in which the heart does not fill up with enough blood, and therefore does not pump enough blood and oxygen to the body. When this happens, parts of the body do not get the blood and oxygen they need to function properly. This can cause symptoms such as breathing problems, fatigue, swelling, and confusion. Heart failure exacerbation refers to heart failure symptoms that get worse. The symptoms may get worse suddenly or develop slowly over time. Heart failure exacerbation is a serious medical problem that should be treated right away. What are the causes? A heart failure exacerbation can be triggered by:  Not taking your heart failure medicines correctly.  Infections.  Eating an unhealthy diet or a diet that is high in salt (sodium).  Drinking too much fluid.  Drinking alcohol.  Taking illegal drugs, such as cocaine or methamphetamine.  Not exercising. Other causes include:  Other heart conditions such as an irregular heartbeat (arrhythmia).  Anemia.  Other medical problems, such as kidney failure. Sometimes the  cause of the exacerbation is not known. What are the signs or symptoms? When heart failure symptoms suddenly or slowly get worse, this may be a sign of heart failure exacerbation. Symptoms of heart failure include:  Breathing problems or shortness of breath.  Chronic coughing or wheezing.  Fatigue.  Nausea or lack of appetite.  Feeling light-headed.  Confusion or memory loss.  Increased heart rate or irregular heartbeat.  Buildup of fluid in the legs, ankles, feet, or abdomen.  Difficulty breathing when lying down. How is this diagnosed? This condition is diagnosed based on:  Your symptoms and medical history.  A physical exam. You may also have tests, including:  Electrocardiogram (ECG). This test measures the electrical activity of your heart.  Echocardiogram. This test uses sound waves to take a picture of your heart to see how well it works.  Blood tests.  Imaging tests, such as: ? Chest X-ray. ? MRI. ? Ultrasound.  Stress test. This test examines how well your heart functions when you exercise. Your heart is monitored while you exercise on a treadmill or exercise bike. If you cannot exercise, medicines may be used to increase your heartbeat in place of exercise.  Cardiac catheterization. During this test, a thin, flexible tube (catheter) is inserted into a blood vessel and threaded up to your heart. This test allows your health care provider to check the arteries that lead to your heart (coronary arteries).  Right heart catheterization. During this test, the pressure in your heart is measured. How is this treated? This condition may be treated by:  Adjusting your heart medicines.  Maintaining a healthy lifestyle. This includes: ? Eating a heart-healthy diet that is low in sodium. ? Not using any products that contain nicotine or tobacco, such as cigarettes and e-cigarettes. ? Regular exercise. ? Monitoring your fluid intake. ? Monitoring your weight and  reporting changes to your health care provider.  Treating sleep apnea, if you have this condition.  Surgery. This may include: ? Implanting a device that helps both sides of your heart contract at the same time (cardiac resynchronization therapy device). This can help with heart function and relieve heart failure symptoms. ? Implanting a device that can correct heart rhythm problems (implantable cardioverter defibrillator). ? Connecting a device to your heart to help it pump blood (ventricular assist device). ? Heart transplant. Follow these instructions at home: Medicines  Take over-the-counter and prescription medicines only as told by your health care provider.  Do not stop taking your medicines or change the amount you take. If you are having problems or side effects from your medicines, talk to your health care provider.  If you are having difficulty paying for your medicines, contact a social worker or your clinic. There are many programs to assist with medicine costs.  Talk to your health care provider before starting any new medicines or supplements.  Make sure your health care provider and pharmacist have a list of all the medicines you are taking. Eating and drinking   Avoid drinking alcohol.  Eat a heart-healthy diet as told by your health care provider. This includes: ? Plenty of fruits and vegetables. ? Lean proteins. ? Low-fat dairy. ? Whole grains. ? Foods that are low in sodium. Activity   Exercise regularly as told by your health care provider. Balance exercise with rest.  Ask your health care provider what activities are safe for you. This includes sexual activity, exercise, and  daily tasks at home or work. Lifestyle  Do not use any products that contain nicotine or tobacco, such as cigarettes and e-cigarettes. If you need help quitting, ask your health care provider.  Maintain a healthy weight. Ask your health care provider what weight is healthy for  you.  Consider joining a patient support group. This can help with emotional problems you may have, such as stress and anxiety. General instructions  Talk to your health care provider about flu and pneumonia vaccines.  Keep a list of medicines that you are taking. This may help in emergency situations.  Keep all follow-up visits as told by your health care provider. This is important. Contact a health care provider if:  You have questions about your medicines or you miss a dose.  You feel anxious, depressed, or stressed.  You have swelling in your feet, ankles, legs, or abdomen.  You have shortness of breath during activity or exercise.  You have a cough.  You have a fever.  You have trouble sleeping.  You gain 2-3 lb (1-1.4 kg) in 24 hours or 5 lb (2.3 kg) in a week. Get help right away if:  You have chest pain.  You have shortness of breath while resting.  You have severe fatigue.  You are confused.  You have severe dizziness.  You have a rapid or irregular heartbeat.  You have nausea or you vomit.  You have a cough that is worse at night or you cannot lie flat.  You have a cough that will not go away.  You have severe depression or sadness. Summary  When heart failure symptoms get worse, it is called heart failure exacerbation.  Common causes of this condition include taking medicines incorrectly, infections, and drinking alcohol.  This condition may be treated by adjusting medicines, maintaining a healthy lifestyle, or surgery.  Do not stop taking your medicines or change the amount you take. If you are having problems or side effects from your medicines, talk to your health care provider. This information is not intended to replace advice given to you by your health care provider. Make sure you discuss any questions you have with your health care provider. Document Released: 10/22/2016 Document Revised: 10/22/2016 Document Reviewed: 10/22/2016 Elsevier  Interactive Patient Education  2019 [place].   Heart Failure Eating Plan Heart failure, also called congestive heart failure, occurs when your heart does not pump blood well enough to meet your body's needs for oxygen-rich blood. Heart failure is a long-term (chronic) condition. Living with heart failure can be challenging. However, following your health care provider's instructions about a healthy lifestyle and working with a diet and nutrition specialist (dietitian) to choose the right foods may help to improve your symptoms. What are tips for following this plan? General guidelines  Do not eat more than 2,300 mg of salt (sodium) a day. The amount of sodium that is recommended for you may be lower, depending on your condition.  Maintain a healthy body weight as directed. Ask your health care provider what a healthy weight is for you. ? Check your weight every day. ? Work with your health care provider and dietitian to make a plan that is right for you to lose weight or maintain your current weight.  Limit how much fluid you drink. Ask your health care provider or dietitian how much fluid you can have each day.  Limit or avoid alcohol as told by your health care provider or dietitian. Reading food labels  Check food labels for the amount of sodium per serving. Choose foods that have less than 140 mg (milligrams) of sodium in each serving.  Check food labels for the number of calories per serving. This is important if you need to limit your daily calorie intake to lose weight.  Check food labels for the serving size. If you eat more than one serving, you will be eating more sodium and calories than what is listed on the label.  Look for foods that are labeled as "sodium-free," "very low sodium," or "low sodium." ? Foods labeled as "reduced sodium" or "lightly salted" may still have more sodium than what is recommended for you. Cooking  Avoid adding salt when cooking. Ask your  health care provider or dietitian before using salt substitutes.  Season food with salt-free seasonings, spices, or herbs. Check the label of seasoning mixes to make sure they do not contain salt.  Cook with heart-healthy oils, such as olive, canola, soybean, or sunflower oil.  Do not fry foods. Cook foods using low-fat methods, such as baking, boiling, grilling, and broiling.  Limit unhealthy fats when cooking by: ? Removing the skin from poultry, such as chicken. ? Removing all visible fats from meats. ? Skimming the fat off from stews, soups, and gravies before serving them. Meal planning   Limit your intake of: ? Processed, canned, or pre-packaged foods. ? Foods that are high in trans fat, such as fried foods. ? Sweets, desserts, sugary drinks, and other foods with added sugar. ? Full-fat dairy products, such as whole milk.  Eat a balanced diet that includes: ? 4-5 servings of fruit each day and 4-5 servings of vegetables each day. At each meal, try to fill half of your plate with fruits and vegetables. ? Up to 6-8 servings of whole grains each day. ? Up to 2 servings of lean meat, poultry, or fish each day. One serving of meat is equal to 3 oz. This is about the same size as a deck of cards. ? 2 servings of low-fat dairy each day. ? Heart-healthy fats. Healthy fats called omega-3 fatty acids are found in foods such as flaxseed and cold-water fish like sardines, salmon, and mackerel.  Aim to eat 25-35 g (grams) of fiber a day. Foods that are high in fiber include apples, broccoli, carrots, beans, peas, and whole grains.  Do not add salt or condiments that contain salt (such as soy sauce) to foods before eating.  When eating at a restaurant, ask that your food be prepared with less salt or no salt, if possible.  Try to eat 2 or more vegetarian meals each week.  Eat more home-cooked food and eat less restaurant, buffet, and fast food. Recommended foods The items listed may not  be a complete list. Talk with your dietitian about what dietary choices are best for you. Grains Bread with less than 80 mg of sodium per slice. Whole-wheat pasta, quinoa, and brown rice. Oats and oatmeal. Barley. [place]. Grits and cream of wheat. Whole-grain and whole-wheat cold cereal. Vegetables All fresh vegetables. Vegetables that are frozen without sauce or added salt. Low-sodium or sodium-free canned vegetables. Fruits All fresh, frozen, and canned fruits. Dried fruits, such as raisins, prunes, and cranberries. Meats and other protein foods Lean cuts of meat. Skinless chicken and [place]. Fish with high omega-3 fatty acids, such as salmon, sardines, and other cold-water fishes. Eggs. Dried beans, peas, and edamame. Unsalted nuts and nut butters. Dairy Low-fat or nonfat (skim) milk and  dried milk. Rice milk, soy milk, and almond milk. Low-fat or nonfat yogurt. Small amounts of reduced-sodium block cheese. Low-sodium cottage cheese. Fats and oils Olive, canola, soybean, flaxseed, or sunflower oil. Avocado. Sweets and desserts Apple sauce. Granola bars. Sugar-free pudding and gelatin. Frozen fruit bars. Seasoning and other foods Fresh and dried herbs. Lemon or lime juice. Vinegar. Low-sodium ketchup. Salt-free marinades, salad dressings, sauces, and seasonings. Foods to avoid The items listed may not be a complete list. Talk with your dietitian about what dietary choices are best for you. Grains Bread with more than 80 mg of sodium per slice. Hot or cold cereal with more than 140 mg sodium per serving. Salted pretzels and crackers. Pre-packaged breadcrumbs. Bagels, croissants, and biscuits. Vegetables Canned vegetables. Frozen vegetables with sauce or seasonings. Creamed vegetables. [place] fries. Onion rings. Pickled vegetables and sauerkraut. Fruits Fruits that are dried with sodium-containing preservatives. Meats and other protein foods Ribs and chicken wings. Bacon, ham, pepperoni,  bologna, salami, and packaged luncheon meats. Hot dogs, bratwurst, and sausage. Canned meat. Smoked meat and fish. Salted nuts and seeds. Dairy Whole milk, half-and-half, and cream. Buttermilk. Processed cheese, cheese spreads, and cheese curds. Regular cottage cheese. Feta cheese. Shredded cheese. String cheese. Fats and oils Butter, lard, shortening, ghee, and bacon fat. Canned and packaged gravies. Seasoning and other foods Onion salt, garlic salt, table salt, and sea salt. Marinades. Regular salad dressings. Relishes, pickles, and olives. Meat flavorings and tenderizers, and bouillon cubes. Horseradish, ketchup, and mustard. Worcestershire sauce. Teriyaki sauce, soy sauce (including reduced sodium). Hot sauce and Tabasco sauce. Steak sauce, fish sauce, oyster sauce, and cocktail sauce. Taco seasonings. Barbecue sauce. Tartar sauce. Summary  A heart failure eating plan includes changes that limit your intake of sodium and unhealthy fat, and it may help you lose weight or maintain a healthy weight. Your health care provider may also recommend limiting how much fluid you drink.  Most people with heart failure should eat no more than 2,300 mg of salt (sodium) a day. The amount of sodium that is recommended for you may be lower, depending on your condition.  Contact your health care provider or dietitian before making any major changes to your diet. This information is not intended to replace advice given to you by your health care provider. Make sure you discuss any questions you have with your health care provider. Document Released: 10/25/2016 Document Revised: 10/25/2016 Document Reviewed: 10/25/2016 Elsevier Interactive Patient Education  2019 [employer].

## 2018-06-14 NOTE — Progress Notes (Signed)
Subjective:  Not all UOP recorded-  Weight down- crt up some - not uremic.  BP is better  Objective Vital signs in last 24 hours: Vitals:   06/13/18 2014 06/14/18 0449 06/14/18 0500 06/14/18 0700  BP: (!) 149/85 (!) 155/82  (!) 154/86  Pulse: 81 83  87  Resp: 18 18  18   Temp: 98.6 F (37 C) 98.5 F (36.9 C)  98.3 F (36.8 C)  TempSrc: Oral Oral  Oral  SpO2: 100% 98%  99%  Weight:   115.8 kg   Height:       Weight change: -1.179 kg  Intake/Output Summary (Last 24 hours) at 06/14/2018 1962 Last data filed at 06/14/2018 0243 Gross per 24 hour  Intake 240 ml  Output 1000 ml  Net -760 ml    Assessment/Plan: 37 year old black female with biopsy-proven diabetic glomerulosclerosis with nephrotic syndrome who has had rocky course of late with episodes of AKI and volume overload 1.Renal-patient appears to have severe diabetic glomerulosclerosis.  As a result she has had fairly rapidly worsening renal function.  I suspect her rising creatinine with diuresis is just an unmasking of what her true GFR is and not necessarily worsening of it-in any event creatinine has been fairly stable albeit poor.  There are no indications for dialysis.    ? Renal mass- CT did not show, rec repeat U/S in 3 mos-   2. Hypertension/volume  -she is volume overloaded.  I  increased her diuretics to torsemide 80 daily- and stopped her hydralazine as that can also cause edema- weight is coming down- continue as dont feel is dry. Had to add back hydralazine because BP was high 3.  Osteomyelitis-new diagnosis but now in question. Now ortho thinking charcot joint vs gout- investigation ongoing.  Abx stopped, follow up with Dr Duda 4. Anemia  -this is not helping her third spacing situation.   iron stores low, repleting and have started an ESA- hgb over 9 now 5.  Bones-PTH 45.  I see that she is already on calcitriol likely from her nephrologist in [place] 5.9- no action yet  6.  Hyperkalemia- had been on  supplementation in the past.   I suspect it may go down with diuresis- stable today  7. Metabolic acidosis- giving bicarb- improving  8. Dispo- renal is still OK with discharge- on torsemide 80 mg PO daily and hydralazine 25 TID with up to 50 if needed for BP- to be determined as OP as well as coreg 25 BID- still anticipate as volume status improves BP will improve.  She now expresses wish to follow up with me- I will arrange- she will keep track of BP for me  Louis Meckel    Labs: Basic Metabolic Panel: Recent Labs  Lab 06/12/18 0553 06/13/18 0438 06/14/18 0557  NA 139 140 140  K 4.1 3.7 3.6  CL 110 109 108  CO2 19* 19* 22  GLUCOSE 79 102* 92  BUN 28* 29* 29*  CREATININE 3.79* 3.69* 4.24*  CALCIUM 8.3* 8.4* 8.2*  PHOS 5.9* 6.1* 5.9*   Liver Function Tests: Recent Labs  Lab 06/12/18 0553 06/13/18 0438 06/14/18 0557  ALBUMIN 2.2* 2.3* 2.2*   No results for input(s): LIPASE, AMYLASE in the last 168 hours. No results for input(s): AMMONIA in the last 168 hours. CBC: Recent Labs  Lab 06/08/18 0423 06/09/18 1003 06/10/18 0502 06/13/18 0724  WBC 5.0 4.5 4.4 5.5  NEUTROABS 2.6 2.0 1.9  --   HGB 7.8*  7.9* 8.1* 9.1*  HCT 26.1* 26.2* 25.2* 29.6*  MCV 91.6 91.0 91.0 89.4  PLT 343 325 334 311   Cardiac Enzymes: No results for input(s): CKTOTAL, CKMB, CKMBINDEX, TROPONINI in the last 168 hours. CBG: Recent Labs  Lab 06/13/18 0739 06/13/18 1213 06/13/18 1628 06/13/18 2100 06/14/18 0726  GLUCAP 99 118* 119* 123* 89    Iron Studies:  No results for input(s): IRON, TIBC, TRANSFERRIN, FERRITIN in the last 72 hours. Studies/Results: Nm Myocar Multi W/spect W/wall Motion / Ef  Result Date: 06/12/2018  Moderate sized region of moderate ischemia in the inferiorI (base, mid), inferolateral(base, mid) and anterolateral (base) walls.  Nuclear stress EF: 54%.  This is an intermediate risk study.    Medications: Infusions: . sodium chloride Stopped (06/08/18  0826)    Scheduled Medications: . aspirin EC  81 mg Oral Daily  . calcitRIOL  0.25 mcg Oral Once per day on Mon Wed Fri  . carvedilol  25 mg Oral BID WC  . darbepoetin (ARANESP) injection - NON-DIALYSIS  200 mcg Subcutaneous Q Tue-1800  . heparin  5,000 Units Subcutaneous Q8H  . hydrALAZINE  25 mg Oral Q8H  . insulin aspart  0-5 Units Subcutaneous QHS  . insulin aspart  0-9 Units Subcutaneous TID WC  . rosuvastatin  40 mg Oral QHS  . senna-docusate  1 tablet Oral BID  . sodium bicarbonate  650 mg Oral BID  . sodium chloride flush  3 mL Intravenous Q12H  . torsemide  80 mg Oral Daily    have reviewed scheduled and prn medications.  Physical Exam: General: NAD- obese and limited mobility  Heart: RRR Lungs: dec BS at bases Abdomen: obese, soft, non tender Extremities: pitting edema still    06/14/2018,9:02 AM  LOS: 9 days

## 2018-06-14 NOTE — Progress Notes (Signed)
Progress Note  Patient Name: Carla Compton Date of Encounter: 06/14/2018  Primary Cardiologist: Establish with Dr Oval Linsey  Subjective   No complaints  Inpatient Medications    Scheduled Meds: . aspirin EC  81 mg Oral Daily  . calcitRIOL  0.25 mcg Oral Once per day on Mon Wed Fri  . carvedilol  25 mg Oral BID WC  . darbepoetin (ARANESP) injection - NON-DIALYSIS  200 mcg Subcutaneous Q Tue-1800  . heparin  5,000 Units Subcutaneous Q8H  . hydrALAZINE  25 mg Oral Q8H  . insulin aspart  0-5 Units Subcutaneous QHS  . insulin aspart  0-9 Units Subcutaneous TID WC  . rosuvastatin  40 mg Oral QHS  . senna-docusate  1 tablet Oral BID  . sodium bicarbonate  650 mg Oral BID  . sodium chloride flush  3 mL Intravenous Q12H  . torsemide  80 mg Oral Daily   Continuous Infusions: . sodium chloride Stopped (06/08/18 0826)   PRN Meds: sodium chloride, acetaminophen, albuterol, ondansetron (ZOFRAN) IV, oxyCODONE, sodium chloride flush   Vital Signs    Vitals:   06/13/18 2014 06/14/18 0449 06/14/18 0500 06/14/18 0700  BP: (!) 149/85 (!) 155/82  (!) 154/86  Pulse: 81 83  87  Resp: 18 18  18   Temp: 98.6 F (37 C) 98.5 F (36.9 C)  98.3 F (36.8 C)  TempSrc: Oral Oral  Oral  SpO2: 100% 98%  99%  Weight:   115.8 kg   Height:        Intake/Output Summary (Last 24 hours) at 06/14/2018 0812 Last data filed at 06/14/2018 0243 Gross per 24 hour  Intake 480 ml  Output 1000 ml  Net -520 ml   Filed Weights   06/11/18 0300 06/13/18 0526 06/14/18 0500  Weight: 119.8 kg 117 kg 115.8 kg    Telemetry    SR - Personally Reviewed  ECG    na  Physical Exam   GEN: No acute distress.   Neck: No JVD Cardiac: RRR, no murmurs, rubs, or gallops.  Respiratory: Clear to auscultation bilaterally. GI: Soft, nontender, non-distended  MS: No edema; No deformity. Neuro:  Nonfocal  Psych: Normal affect   Labs    Chemistry Recent Labs  Lab 06/12/18 0553 06/13/18 0438  06/14/18 0557  NA 139 140 140  K 4.1 3.7 3.6  CL 110 109 108  CO2 19* 19* 22  GLUCOSE 79 102* 92  BUN 28* 29* 29*  CREATININE 3.79* 3.69* 4.24*  CALCIUM 8.3* 8.4* 8.2*  ALBUMIN 2.2* 2.3* 2.2*  GFRNONAA 15* 15* 13*  GFRAA 17* 17* 15*  ANIONGAP 10 12 10      Hematology Recent Labs  Lab 06/09/18 1003 06/10/18 0502 06/13/18 0724  WBC 4.5 4.4 5.5  RBC 2.88* 2.77* 3.31*  HGB 7.9* 8.1* 9.1*  HCT 26.2* 25.2* 29.6*  MCV 91.0 91.0 89.4  MCH 27.4 29.2 27.5  MCHC 30.2 32.1 30.7  RDW 13.8 13.8 13.9  PLT 325 334 311    Cardiac EnzymesNo results for input(s): TROPONINI in the last 168 hours. No results for input(s): TROPIPOC in the last 168 hours.   BNPNo results for input(s): BNP, PROBNP in the last 168 hours.   DDimer No results for input(s): DDIMER in the last 168 hours.   Radiology    Nm Myocar Multi W/spect W/wall Motion / Ef  Result Date: 06/12/2018  Moderate sized region of moderate ischemia in the inferiorI (base, mid), inferolateral(base, mid) and anterolateral (base) walls.  Nuclear stress  EF: 54%.  This is an intermediate risk study.     Cardiac Studies    Patient Profile     Carla Abdullahis a 37 y.o.femalewith a hx of seropositive erosive rheumatoid arthritis,CKD,poorly controlled diabetes with diabetic neuropathy, history of diabetic myonecrosis right thigh 2017, hypertension, hyperlipidemia, GERD and obesitywho is being seen today for the evaluation of CHFat the request of Dr. [place] Crane.  Assessment & Plan    1. Acute on chronic combined systolic/diastolic HF - 34/1962 echo LVEF 45-50%, grade II diastolic dysfunction - presented with AKI, given IVFs and became volume overloaded - from notes off hydralazine due to concern it was contributing to edema - 05/2018 nuclear stresss for mild LV dysfunction showed moderate ischemia inferior, inferolateral, and anterolateral walls. Intermediate risk - not a cath candidate in setting of her poor renal  function, particularly in absence of high risk cardiac findings. Medical therapy for suspected ischemic heart disease with coreg, crestor, ASA No ACE/ARB due to poor renal function.  - defer diuretics to renal  2. AKI on CKD - followed by nephrology - from notes baseline Cr is 3.8   3. HTN - limited options due to her poor renal function - coreg increased this admission. Retried on hydralzine by nephrology - follow bp's as she continues to diurese. [id number] systolic overnight.   - follow trends today, may titrate cloidine further if needed  [facility] will sign off.   Medication Recommendations:  Continue current therapy. We defer diuretics to nephrology Other recommendations (labs, testing, etc):   Follow up as an outpatient:  We will arrange f/u with Dr Oval Linsey or her PA in 2-3 weeks.     For questions or updates, please contact [place] Please consult [email or web address] for contact info under        Signed, Carlyle Dolly, MD  06/14/2018, 8:12 AM

## 2018-06-14 NOTE — Progress Notes (Signed)
Attempted to arrange [place] with: [facility]- no [facility]-no [facility]-no [employer] educational needs [employer] [facility]) [facility]-no [facility]- no [facility]-no  [employer]- office closed [phone]

## 2018-06-14 NOTE — Discharge Summary (Signed)
Discharge Summary  Carla Compton EAV:[id number] DOB: 1980/08/07  PCP: No primary care provider on file.  Admit date: 06/05/2018 Discharge date: 06/14/2018  Time spent: 35 minutes  Recommendations for Outpatient Follow-up:  1. Follow-up with nephrology 2. Follow-up with cardiology 3. Follow-up with orthopedic surgery [name] 4. Follow-up with your PCP 5. Take your medications as prescribed  Discharge Diagnoses:  Active Hospital Problems   Diagnosis Date Noted  . Acute on chronic combined systolic and diastolic CHF (congestive heart failure) ([place]) 06/05/2018  . Swelling   . AKI (acute kidney injury) ([place])   . Diabetic polyneuropathy associated with type 2 diabetes mellitus ([place])   . Severe protein-calorie malnutrition ([place])   . CKD (chronic kidney disease) stage 4, GFR 15-29 ml/min (H[facility]) 05/26/2018  . Uncontrolled secondary diabetes mellitus with stage 4 CKD (GFR 15-29) ([place]) 05/24/2018    Resolved Hospital Problems  No resolved problems to display.    Discharge Condition: Stable  Diet recommendation: Heart healthy diabetic diet  Vitals:   06/14/18 0922 06/14/18 1257  BP: (!) 149/83 (!) 160/92  Pulse: 86 82  Resp:  20  Temp:  97.7 F (36.5 C)  SpO2:  100%    History of present illness:   Carla Abdullahis a 37 y.o.femalewith medical history significant ofDM2, CKD stage 4, HTN. Patient was recently admitted to our service from Dec 1-7 with CHF exacerbation. 2d echo showed EF 78-29%, grade 2 diastolic dysfunction. Of concern creat was ranging between 3-3.98 during that admission. Patient was diuresed and discharged on torsemide 26m daily.  Patient came to ED at OSH for indigestion symptoms but was admitted for creat 4.34 (BNP 340 on admit).  Hospital course complicated by questionable right foot osteomyelitis incidentally found on right foot x-ray and on MRI.  No trauma or puncture wound.  Discussed with orthopedic surgery [name] suspects gout and  will follow in the office post discharge within a week.  Started on colchicine 0.6 mg twice daily x5 days. Allopurinol will be started outpatient in Dr. [name].  Stress test done by cardiology which revealed moderate sized moderate ischemia in the inferior, inferior lateral and anterior lateral walls with an EF of 54%. Uncontrolled hypertension managed by nephrology.  06/14/2018: Patient seen and examined with her sister at bedside.  No acute events overnight.  Blood pressure managed by nephrology.  Cardiology signed off.  Okay to be discharge per nephrology.  No new complaints.   Hospital Course:  Principal Problem:   Acute on chronic combined systolic and diastolic CHF (congestive heart failure) (HCC) Active Problems:   Uncontrolled secondary diabetes mellitus with stage 4 CKD (GFR 15-29) (HCC)   CKD (chronic kidney disease) stage 4, GFR 15-29 ml/min (HCC)   Diabetic polyneuropathy associated with type 2 diabetes mellitus (HCC)   Severe protein-calorie malnutrition (HCC)   AKI (acute kidney injury) (HCC)   Swelling  Fluid overload suspect multifactorial secondary to worsening advanced renal failure versus acute on chronic combined diastolic and systolic CHF Continue torsemide 80 mg daily per nephrology  Uncontrolled hypertension, improving Continue torsemide 80 mg daily Continue hydralazine 25 mg 3 times daily; can increase up to 50 mg 3 times daily outpatient per nephrology Continue Coreg 25 mg twice daily Follow-up with nephrology  AKI on CKD 4 Baseline appears to be 3.0 with GFR of 22 Continue to avoid nephrotoxic agent Creatinine  4.24 from 3.69  Follow-up with nephrology  Resolved non-anion gap metabolic acidosis suspect secondary to advanced renal failure Nephrology following  Probable complex cyst in the periphery of the left mid kidney measuring 2.4 cm CT abdomen pelvis without contrast ordered to further assess possible mass or neoplasm cannot be excluded.   Recommendation for further evaluation with MRI or follow-up ultrasound in 3 months.  Acute on chronic combined diastolic and systolic CHF Last 2D echo done on 05/25/2018 revealed LVEF 45 to 50% with diffuse hypokinesis and grade 2 diastolic dysfunction BNP greater than 300 Continue strict I's and O's and daily weight Continue cardiac medications Abnormal ischemic work-up.  Not a candidate for heart cath due to poor renal function and absence of high risk cardiac findings per cardiology.  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 outpatient within a week ABI negative  History of rheumatoid arthritis Self-reports receive injection every 8 weeks for rheumatoid arthritis Follow-up with rheumatology outpatient  Hyperkalemia, resolved Suspect secondary to advanced renal failure Treated with IV calcium gluconate 1 g once and 30 g of p.o. Kayexalate once  Left fifth toe wound, present admission Wound care specialist consulted.  Appreciate recommendations  Type 2 diabetes with hyperglycemia Last A1c 7.1 on 05/26/2018 CBGs has been in the 80s, will hold off insulin to avoid hypoglycemia Follow-up with your PCP Repeat hemoglobin A1c in 3 months  Diabetes polyneuropathy ABI negative  Anemia of chronic disease Hemoglobin stable at 9.1  No sign of overt bleeding Aranesp and ferric gluconate per nephrology Repeat CBC in the morning  Severe morbid obesity BMI 47 Recommend weight loss outpatient Healthy dieting and regular exercise\\ Weight today 115 kg from 125 kg on admission  Resolving intermittent right ear pain unclear etiology No drainage Pain management in place  Code Status:Full  Consults  called: Nephrology and general surgery   Discharge Exam: BP (!) 160/92   Pulse 82   Temp 97.7 F (36.5 C) (Oral)   Resp 20   Ht _0  (1.575 m)   Wt 115.8 kg   LMP 05/29/2018   SpO2 100%   BMI 46.69 kg/m  . General: 37 y.o. year-old female well developed well nourished in no acute distress.  Alert and oriented x3. . Cardiovascular: Regular rate and rhythm with no rubs or gallops.  No thyromegaly or JVD noted.   Marland Kitchen Respiratory: Clear to auscultation with no wheezes or rales. Good inspiratory effort. . Abdomen: Soft nontender nondistended with normal bowel sounds x4 quadrants. . Musculoskeletal: No lower extremity edema. 2/4 pulses in all 4 extremities. . Skin: No ulcerative lesions noted or rashes, . Psychiatry: Mood is appropriate for condition and setting  Discharge Instructions You were cared for by a hospitalist during your hospital stay. If you have any questions about your discharge medications or the care you received while you were in the hospital after you are discharged, you can call the unit and asked to speak with the hospitalist on call if the hospitalist that took care of you is not available. Once you are discharged, your primary care physician will handle any further medical issues. Please note that NO REFILLS for any discharge medications will be authorized once you are discharged, as it is imperative that you return to your primary care physician (or establish a relationship with a primary care physician if you do not have one) for your aftercare needs so  that they can reassess your need for medications and monitor your lab values.   Allergies as of 06/14/2018      Reactions   Contrast Media [iodinated Diagnostic Agents]       Medication List    STOP taking these medications   acetaminophen 500 MG tablet Commonly known as:  TYLENOL   amoxicillin-clavulanate 875-125 MG tablet Commonly known as:  AUGMENTIN     TAKE these medications   aspirin 81 MG EC  tablet Take 1 tablet (81 mg total) by mouth daily. Start taking on:  June 15, 2018   calcitRIOL 0.25 MCG capsule Commonly known as:  ROCALTROL Take 1 capsule (0.25 mcg total) by mouth 3 (three) times a week.   carvedilol 25 MG tablet Commonly known as:  COREG Take 1 tablet (25 mg total) by mouth 2 (two) times daily with a meal. What changed:    medication strength  how much to take   hydrALAZINE 25 MG tablet Commonly known as:  APRESOLINE Take 1 tablet (25 mg total) by mouth every 8 (eight) hours. What changed:    medication strength  how much to take  when to take this   PROAIR HFA 108 (90 Base) MCG/ACT inhaler Generic drug:  albuterol Inhale 2 puffs into the lungs every 4 (four) hours as needed for wheezing or shortness of breath.   rosuvastatin 40 MG tablet Commonly known as:  CRESTOR Take 40 mg by mouth at bedtime.   sodium bicarbonate 650 MG tablet Take 1 tablet (650 mg total) by mouth daily.   torsemide 20 MG tablet Commonly known as:  DEMADEX Take 4 tablets (80 mg total) by mouth daily. Start taking on:  June 15, 2018 What changed:  how much to take      Allergies  Allergen Reactions  . Contrast Media [Iodinated Diagnostic Agents]    Follow-up Information    Newt Minion, MD Follow up in 1 week(s).   Specialty:  Orthopedic Surgery Contact information: [place] [address] [phone]        Skeet Latch, MD Follow up.   Specialty:  Cardiology Why:  the office should call Monday and arrange a follow up app for you in 3 weeks, if you have not heard by Tuesday AM call the office.  Contact information: [address] [place] [address] [phone]        Corliss Parish, MD. Call in 1 day(s).   Specialty:  Nephrology Why:  Please call for a post hospital follow-up appointment Contact information: [place] [address] [phone]        [facility]. Call in 1 day(s).   Why:  Please call for a post hospital follow-up appointment. Contact information: [address] [place] [address] [phone]           The results of significant diagnostics from this hospitalization (including imaging, microbiology, ancillary and laboratory) are listed below for reference.    Significant Diagnostic Studies: Ct Abdomen Pelvis Wo Contrast  Result Date: 06/11/2018 CLINICAL DATA:  Renal cyst. EXAM: CT ABDOMEN AND PELVIS WITHOUT CONTRAST TECHNIQUE: Multidetector CT imaging of the abdomen and pelvis was performed following the standard protocol without IV contrast. COMPARISON:  Ultrasound of June 09, 2018. FINDINGS: Lower chest: No acute abnormality. Hepatobiliary: No focal liver abnormality is seen. No gallstones, gallbladder wall thickening, or biliary dilatation. Pancreas: Unremarkable. No pancreatic ductal dilatation or surrounding inflammatory changes. Spleen:  Normal in size without focal abnormality. Adrenals/Urinary Tract: Adrenal glands are unremarkable. Kidneys are normal, without renal calculi, focal lesion, or hydronephrosis. Bladder is unremarkable. Stomach/Bowel: Stomach is within normal limits. Appendix appears normal. No evidence of bowel wall thickening, distention, or inflammatory changes. Vascular/Lymphatic: No significant vascular findings are present. No enlarged abdominal or pelvic lymph nodes. Reproductive: 3.9 cm uterine fibroid is noted. No adnexal abnormality is noted. Other: No abdominal wall hernia or abnormality. No abdominopelvic ascites. Musculoskeletal: No acute or significant osseous findings. IMPRESSION: 4 cm uterine fibroid is noted. No other definite abnormality seen in the abdomen or pelvis. Left renal abnormality noted on prior ultrasound is not visualized on this unenhanced study. Therefore, possible mass or neoplasm can not be excluded on the basis of this study. Further  evaluation with MRI or follow-up ultrasound in 3 months is recommended. Electronically Signed   By: Marijo Conception, M.D.   On: 06/11/2018 10:02   Dg Chest 2 View  Result Date: 06/06/2018 CLINICAL DATA:  Acute on chronic combined systolic and diastolic congestive heart failure. EXAM: CHEST - 2 VIEW COMPARISON:  Radiograph of May 25, 2018. FINDINGS: Stable cardiomediastinal silhouette. No pneumothorax or significant pleural effusion is noted. Mild central pulmonary vascular congestion is noted with possible minimal bilateral pulmonary edema which appears unchanged compared to prior exam. Bony thorax is unremarkable. IMPRESSION: Stable central pulmonary vascular congestion with minimal probable bilateral pulmonary edema which is not significantly changed compared to prior exam. Electronically Signed   By: Marijo Conception, M.D.   On: 06/06/2018 08:30   Dg Chest 2 View  Result Date: 05/25/2018 CLINICAL DATA:  Congestive heart failure. EXAM: CHEST - 2 VIEW COMPARISON:  None. FINDINGS: Heart size is normal. There is pulmonary venous hypertension with mild interstitial edema, fluid in the fissures and a small amount of fluid in the posterior costophrenic angles. No consolidation or collapse. No significant bone finding. IMPRESSION: Congestive heart failure pattern with pulmonary venous hypertension, interstitial edema and a small pleural fluid. Electronically Signed   By: Nelson Chimes M.D.   On: 05/25/2018 07:05   US Renal  Result Date: 06/09/2018 CLINICAL DATA:  Acute renal insufficiency, chronic renal disease EXAM: RENAL / URINARY TRACT ULTRASOUND COMPLETE COMPARISON:  None. FINDINGS: Right Kidney: Renal measurements: 13.0 x 4.9 x 6.4 cm = volume: 213 mL. No hydronephrosis is seen. No solid or cystic renal mass is evident. The echogenicity of the renal parenchyma is slightly increased consistent with chronic renal medical disease. Left Kidney: Renal measurements: 12.2 x 6.2 x 6.1 cm = volume: 243 mL. No  hydronephrosis is seen. The echogenicity of the renal parenchyma is increased. There is a hypoechoic structure in the periphery the left mid kidney of 2.4 cm probably representing complex cyst but difficult to assess on this study. Bladder: The urinary bladder is not well distended but is grossly unremarkable. IMPRESSION: 1. No hydronephrosis. 2. Somewhat echogenic renal parenchyma consistent with chronic renal medical disease. 3. Probable complex cyst in the periphery of the left mid kidney of 2.4 cm. If necessary consider CT or MRI to assess further. Electronically Signed   By: Ivar Drape M.D.   On: 06/09/2018 15:53   Mr Foot Right Wo Contrast  Result Date: 06/08/2018 CLINICAL DATA:  Osteomyelitis. EXAM: MRI OF THE RIGHT FOREFOOT WITHOUT CONTRAST TECHNIQUE: Multiplanar, multisequence MR imaging of the right forefoot was performed. No intravenous contrast was administered due to impaired renal function. COMPARISON:  Radiographs dated 04/07/2018 FINDINGS: Bones/Joint/Cartilage There is abnormal  signal and bone destruction and cortical thickening and irregularity of the bases of second, third, fourth, and fifth metatarsals. Is abnormal signal and bone destruction involving the cuboid and lateral cuneiform. There is fragmentation of the base of the fifth metatarsal. There has focal erosion of the base of the first metatarsal at the insertion of the peroneus longus tendon. There is an effusion at the IP joint of the great toe without bone destruction. There are small ankle joint and subtalar joint effusions as well as a small effusion at the talonavicular joint. Muscles and Tendons No acute abnormality. Soft tissues There is a focal subcutaneous fluid collection adjacent to the plantar aspect of the fifth MTP joint measuring 22 x 18 x 11 mm which may represent an abscess. There is prominent dorsal subcutaneous edema, nonspecific. IMPRESSION: 1. 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 2. Possible soft tissue abscess adjacent to the plantar aspect of the fifth MTP joint. 3. Subcutaneous edema of the forefoot, nonspecific but this could represent cellulitis. 4. Multiple joint effusions, nonspecific. Electronically Signed   By: Lorriane Shire M.D.   On: 06/08/2018 11:40   Nm Myocar Multi W/spect W/wall Motion / Ef  Result Date: 06/12/2018  Moderate sized region of moderate ischemia in the inferiorI (base, mid), inferolateral(base, mid) and anterolateral (base) walls.  Nuclear stress EF: 54%.  This is an intermediate risk study.    Dg Foot Complete Left  Result Date: 05/27/2018 CLINICAL DATA:  Possible fifth digit fracture with pain. EXAM: LEFT FOOT - COMPLETE 3+ VIEW COMPARISON:  None. FINDINGS: Irregularity of the fifth digit skin which may be related to history of bandage. There is no soft tissue emphysema, opaque foreign body, erosion, or fracture. Dorsal soft tissue swelling of the forefoot. Osteopenia IMPRESSION: No acute osseous finding. Electronically Signed   By: Monte Fantasia M.D.   On: 05/27/2018 14:39   Dg Foot Complete Right  Result Date: 06/07/2018 CLINICAL DATA:  Diabetic patient with a wound on the lateral aspect of the right foot. EXAM: RIGHT FOOT COMPLETE - 3+ VIEW COMPARISON:  None. FINDINGS: Bony destructive change and periosteal reaction are seen in approximately the proximal 7 cm of the fifth metatarsal, almost entire fourth metatarsal and base of the third metatarsal. Bony destructive change is also seen in the cuboid and lateral cuneiform. Focal soft tissue prominence is seen adjacent to the fifth MTP joint which could be an abscess or blister. Soft tissues of the foot are markedly swollen. Flattening of the head of the second metatarsal is likely due to remote microfracture. IMPRESSION: Findings consistent with osteomyelitis in the third through fifth metatarsals, lateral cuneiform and  cuboid. Diffuse soft tissue swelling about the foot. More focal swelling adjacent to the fifth MTP joint could be an abscess or blister. Electronically Signed   By: Inge Rise M.D.   On: 06/07/2018 15:11   Vas [place] Burnard Bunting With/wo Tbi  Result Date: 06/09/2018 LOWER EXTREMITY DOPPLER STUDY Indications: Ulceration.  Performing Technologist: Carlos Levering Rvt  Examination Guidelines: A complete evaluation includes at minimum, Doppler waveform signals and systolic blood pressure reading at the level of bilateral brachial, anterior tibial, and posterior tibial arteries, when vessel segments are accessible. Bilateral testing is considered an integral part of a complete examination. Photoelectric Plethysmograph (PPG) waveforms and toe systolic pressure readings are included as required and additional duplex testing as needed. Limited examinations for reoccurring indications may be performed  as noted.  ABI Findings: +---------+------------------+-----+---------+--------+ Right    Rt Pressure (mmHg)IndexWaveform Comment  +---------+------------------+-----+---------+--------+ Brachial 164                    triphasic         +---------+------------------+-----+---------+--------+ PTA      198               1.14 triphasic         +---------+------------------+-----+---------+--------+ DP       198               1.14 triphasic         +---------+------------------+-----+---------+--------+ Great Toe139               0.80                   +---------+------------------+-----+---------+--------+ +---------+------------------+-----+---------+-------+ Left     Lt Pressure (mmHg)IndexWaveform Comment +---------+------------------+-----+---------+-------+ Brachial 173                    triphasic        +---------+------------------+-----+---------+-------+ PTA      199               1.15 triphasic        +---------+------------------+-----+---------+-------+ DP       200                1.16 triphasic        +---------+------------------+-----+---------+-------+ Great Toe139               0.80                  +---------+------------------+-----+---------+-------+ +-------+-----------+-----------+------------+------------+ ABI/TBIToday's ABIToday's TBIPrevious ABIPrevious TBI +-------+-----------+-----------+------------+------------+ Right  1.14       0.8                                 +-------+-----------+-----------+------------+------------+ Left   1.16       0.8                                 +-------+-----------+-----------+------------+------------+  Summary: Right: Resting right ankle-brachial index is within normal range. No evidence of significant right lower extremity arterial disease. The right toe-brachial index is normal. Left: Resting left ankle-brachial index is within normal range. No evidence of significant left lower extremity arterial disease. The left toe-brachial index is normal.  *See table(s) above for measurements and observations.  Electronically signed by Deitra Mayo MD on 06/09/2018 at 4:32:04 PM.   Final     Microbiology: No results found for this or any previous visit (from the past 240 hour(s)).   Labs: Basic Metabolic Panel: Recent Labs  Lab 06/09/18 0514 06/10/18 0502 06/11/18 0339 06/12/18 0553 06/13/18 0438 06/14/18 0557  NA 138 139 140 139 140 140  K 4.7 4.6 4.4 4.1 3.7 3.6  CL 112* 111 111 110 109 108  CO2 15* 20* 20* 19* 19* 22  GLUCOSE 104* 81 89 79 102* 92  BUN 31* 29* 29* 28* 29* 29*  CREATININE 3.83* 3.86* 3.58* 3.79* 3.69* 4.24*  CALCIUM 8.1* 8.3* 8.3* 8.3* 8.4* 8.2*  PHOS 6.2*  --   --  5.9* 6.1* 5.9*   Liver Function Tests: Recent Labs  Lab 06/12/18 0553 06/13/18 0438 06/14/18 0557  ALBUMIN 2.2* 2.3* 2.2*   No results for input(s): LIPASE, AMYLASE  in the last 168 hours. No results for input(s): AMMONIA in the last 168 hours. CBC: Recent Labs  Lab 06/08/18 0423  06/09/18 1003 06/10/18 0502 06/13/18 0724  WBC 5.0 4.5 4.4 5.5  NEUTROABS 2.6 2.0 1.9  --   HGB 7.8* 7.9* 8.1* 9.1*  HCT 26.1* 26.2* 25.2* 29.6*  MCV 91.6 91.0 91.0 89.4  PLT 343 325 334 311   Cardiac Enzymes: No results for input(s): CKTOTAL, CKMB, CKMBINDEX, TROPONINI in the last 168 hours. BNP: BNP (last 3 results) Recent Labs    06/05/18 2335  BNP 369.8*    ProBNP (last 3 results) No results for input(s): PROBNP in the last 8760 hours.  CBG: Recent Labs  Lab 06/13/18 1213 06/13/18 1628 06/13/18 2100 06/14/18 0726 06/14/18 1213  GLUCAP 118* 119* 123* 89 87       Signed:  Kayleen Memos, MD [facility] 06/14/2018, 2:37 PM

## 2018-06-23 ENCOUNTER — Encounter: Payer: Self-pay | Admitting: Medical

## 2018-06-23 ENCOUNTER — Telehealth: Payer: Self-pay

## 2018-06-23 ENCOUNTER — Ambulatory Visit (INDEPENDENT_AMBULATORY_CARE_PROVIDER_SITE_OTHER): Payer: BLUE CROSS/BLUE SHIELD | Admitting: Medical

## 2018-06-23 VITALS — BP 148/88 | HR 84 | Ht 62.0 in | Wt 254.8 lb

## 2018-06-23 DIAGNOSIS — N184 Chronic kidney disease, stage 4 (severe): Secondary | ICD-10-CM | POA: Diagnosis not present

## 2018-06-23 DIAGNOSIS — I5042 Chronic combined systolic (congestive) and diastolic (congestive) heart failure: Secondary | ICD-10-CM | POA: Diagnosis not present

## 2018-06-23 DIAGNOSIS — E785 Hyperlipidemia, unspecified: Secondary | ICD-10-CM

## 2018-06-23 DIAGNOSIS — I255 Ischemic cardiomyopathy: Secondary | ICD-10-CM

## 2018-06-23 DIAGNOSIS — I10 Essential (primary) hypertension: Secondary | ICD-10-CM | POA: Diagnosis not present

## 2018-06-23 DIAGNOSIS — E1142 Type 2 diabetes mellitus with diabetic polyneuropathy: Secondary | ICD-10-CM

## 2018-06-23 MED ORDER — HYDRALAZINE HCL 50 MG PO TABS: 50.0000 mg | ORAL_TABLET | Freq: Three times a day (TID) | ORAL | 1 refills | Status: DC

## 2018-06-23 MED ORDER — HYDRALAZINE HCL 50 MG PO TABS: 50.0000 mg | ORAL_TABLET | Freq: Three times a day (TID) | ORAL | 1 refills | Status: AC

## 2018-06-23 NOTE — Patient Instructions (Signed)
Medication Instructions:  Increase Hydralazine to 50 mg three times daily.  If you need a refill on your cardiac medications before your next appointment, please call your pharmacy.   Follow-Up: At [facility], you and your health needs are our priority.  As part of our continuing mission to provide you with exceptional heart care, we have created designated Provider Care Teams.  These Care Teams include your primary Cardiologist (physician) and Advanced Practice Providers (APPs -  Physician Assistants and Nurse Practitioners) who all work together to provide you with the care you need, when you need it. You will need a follow up appointment in 3 months.  Please call our office 2 months in advance to schedule this appointment.  You may see Pixie Casino, MD or one of the following Advanced Practice Providers on your designated Care Team: [place], [place] . Fabian Sharp, PA-C  Any Other Special Instructions Will Be Listed Below (If Applicable). NO MORE than 500mg  of Sodium per meal.   DO NOT DRINK more than 1-2 quarts of Liquids per day. Take Daily Weight

## 2018-06-23 NOTE — Progress Notes (Signed)
Cardiology Office Note   Date:  06/23/2018   ID:  Echo, Propp [birth year], MRN [id number]  PCP:  Abigail Miyamoto, FNP  Cardiologist:  Pixie Casino, MD EP: None  Chief Complaint  Patient presents with  . Follow-up      History of Present Illness: Carla Compton is a 37 y.o. 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  She has had two admissions this month for acute/acute on chronic combined CHF. Echo 05/25/18 with EF 45-50%, G2DD, and diffuse hypokinesis. NST 06/11/18 with EF 54% and a moderate sized region of moderate ischemia in the inferior/inferolateral/anterolateral walls. Given her significant renal dysfunction, she was managed medically for suspected ischemic heart disease and discharged home on coreg, crestor, and aspirin. She was diuresed with IV lasix and discharged home on torsemide 80mg  daily.   She presents today for post hospital follow-up of CHF. She reports doing well since discharge from the hospital. She reports improvement in LE edema and no complaints of chest pain or SOB. She has been taking her torsemide as prescribed with good urine output. She reports compliance with a low sodium diet and has been monitoring her weight daily at home without significant change from discharge. She has been monitoring her blood pressure which typically runs in the 140s/80s-90s. She reported continued foot pain with some bleeding on her left foot yesterday, for which she has an appointment scheduled to see ortho 06/25/18. She has an appointment to follow-up with her Nephrologist 07/15/18.        Past Medical History:  Diagnosis Date  . Benign essential HTN   . CKD  (chronic kidney disease) stage 3, GFR 30-59 ml/min (HCC)   . Diabetes ([place])     No past surgical history on file.   Current Outpatient Medications  Medication Sig Dispense Refill  . albuterol (PROAIR HFA) 108 (90 Base) MCG/ACT inhaler Inhale 2 puffs into the lungs every 4 (four) hours as needed for wheezing or shortness of breath.    Marland Kitchen aspirin EC 81 MG EC tablet Take 1 tablet (81 mg total) by mouth daily. 30 tablet 0  . calcitRIOL (ROCALTROL) 0.25 MCG capsule Take 1 capsule (0.25 mcg total) by mouth 3 (three) times a week. 12 capsule 0  . carvedilol (COREG) 25 MG tablet Take 1 tablet (25 mg total) by mouth 2 (two) times daily with a meal. 60 tablet 0  . hydrALAZINE (APRESOLINE) 50 MG tablet Take 1 tablet (50 mg total) by mouth 3 (three) times daily. 90 tablet 1  . rosuvastatin (CRESTOR) 40 MG tablet Take 40 mg by mouth at bedtime.  1  . sodium bicarbonate 650 MG tablet Take 1 tablet (650 mg total) by mouth daily. 14 tablet 0  . torsemide (DEMADEX) 20 MG tablet Take 4 tablets (80 mg total) by mouth daily. 120 tablet 0   No current facility-administered medications for this visit.     Allergies:   Contrast media [iodinated diagnostic agents]    Social History:  The patient  reports that she has never smoked. She has never used smokeless tobacco. She reports that she does not drink alcohol or use drugs.   Family History:  The patient's family history includes Diabetes in her father and maternal grandmother;  Hypertension in her father and mother; Mitral valve prolapse in her mother.    ROS:  Please see the history of present illness.   Otherwise, review of systems are positive for none.   All other systems are reviewed and negative.    PHYSICAL EXAM: VS:  BP (!) 148/88   Pulse 84   Ht 5\' 2"  (1.575 m)   Wt 254 lb 12.8 oz (115.6 kg)   LMP 05/29/2018   BMI 46.60 kg/m  , BMI Body mass index is 46.6 kg/m. GEN: Well nourished, well developed, in no acute distress HEENT: sclera  anicteric Neck: no JVD, carotid bruits, or masses Cardiac: RRR; no murmurs, rubs, or gallops, trace-1+ LE edema  Respiratory:  clear to auscultation bilaterally, normal work of breathing GI: soft, obese, nontender, nondistended, + BS MS: no deformity or atrophy Skin: warm and dry, no rash Neuro:  Strength and sensation are intact Psych: euthymic mood, full affect   EKG:  EKG is not ordered today.   Recent Labs: 05/24/2018: TSH 5.567 06/05/2018: ALT 11; B Natriuretic Peptide 369.8 06/13/2018: Hemoglobin 9.1; Platelets 311 06/14/2018: BUN 29; Creatinine, Ser 4.24; Potassium 3.6; Sodium 140    Lipid Panel    Component Value Date/Time   CHOL 194 05/27/2018 0349   TRIG 190 (H) 05/27/2018 0349   HDL 40 (L) 05/27/2018 0349   CHOLHDL 4.9 05/27/2018 0349   VLDL 38 05/27/2018 0349   LDLCALC 116 (H) 05/27/2018 0349      Wt Readings from Last 3 Encounters:  06/23/18 254 lb 12.8 oz (115.6 kg)  06/14/18 255 lb 4.8 oz (115.8 kg)  05/30/18 266 lb 14.4 oz (121.1 kg)      Other studies Reviewed: Additional studies/ records that were reviewed today include:  Echocardiogram 05/25/18: Study Conclusions  - Left ventricle: The cavity size was mildly dilated. There was   mild concentric hypertrophy. Systolic function was mildly   reduced. The estimated ejection fraction was in the range of 45%   to 50%. Diffuse hypokinesis. Features are consistent with a   pseudonormal left ventricular filling pattern, with concomitant   abnormal relaxation and increased filling pressure (grade 2   diastolic dysfunction). - Left atrium: The atrium was mildly dilated.  NST 06/11/18:  Moderate sized region of moderate ischemia in the inferiorI (base, mid), inferolateral(base, mid) and anterolateral (base) walls.  Nuclear stress EF: 54%.  This is an intermediate risk study.    ASSESSMENT AND PLAN:  1. Chronic combined CHF: Echo 05/25/18 with EF 45-50%, G2DD. NST 06/01/18 with moderate sized  region of moderate ischemia in the inferior/inferolateral/anterolateral walls. Given her significant renal dysfunction, decision made to manage medically with aspirin, statin, and BBlocker. She was diuresed with IV lasix and discharged home on torsemide 80mg  daily. He weight was 115.8kg on the day of discharge.  - Will check a BMET to monitor kidney function with ongoing diuresis.  - Continue torsemide 80mg  daily  - Continue carvedilol 25mg  BID - Continue to monitor weights daily and encourage a low sodium diet  2. 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 Cardiac cath deferred given CKD and medical management initiated with aspirin, statin, and carvedilol. She has no anginal complaints at this time.  - Continue aspirin, statin, and carvedilol.   3. HTN: BP remains above goal. Coreg was uptitrated to 25mg  BID and she was started on hydralazine 25mg  TID by nephrology during recent hospitalization with plans to uptitrate outpatient as needed.  - Continue carvedilol  25mg  BID - Will uptitrate hydralazine to 50mg  TID at this time for better BP control  4. DM type 2: A1C 7.2 05/26/18; goal <7. Does not appear to be on medications at this time as her hospital glucose levels were in the 80s.  - Continue management per PCP   5. HLD: LDL 116 05/27/18 - Continue crestor 40mg  daily - Will need repeat lipids and LFTs in 3 months - can check at her next follow-up appointment.     Current medicines are reviewed at length with the patient today.  The patient does not have concerns regarding medicines.  The following changes have been made:  Hydralazine increased to 50mg  TID.   Labs/ tests ordered today include: BMET   Disposition:   FU with Dr. Debara Pickett in 3 months  Signed, Abigail Butts, PA-C  06/23/2018 9:42 AM

## 2018-06-23 NOTE — Telephone Encounter (Signed)
Called patient, she was seen in office by Daleen Snook PA, and needed to have a BMET done, patient needs to come as soon as she can to the office to have completed.  Left call back number to discuss with patient.

## 2018-06-25 ENCOUNTER — Ambulatory Visit (INDEPENDENT_AMBULATORY_CARE_PROVIDER_SITE_OTHER): Payer: BLUE CROSS/BLUE SHIELD | Admitting: Physician Assistant

## 2018-06-25 ENCOUNTER — Encounter (INDEPENDENT_AMBULATORY_CARE_PROVIDER_SITE_OTHER): Payer: Self-pay | Admitting: Orthopedic Surgery

## 2018-06-25 VITALS — Ht 62.0 in | Wt 254.8 lb

## 2018-06-25 DIAGNOSIS — I5043 Acute on chronic combined systolic (congestive) and diastolic (congestive) heart failure: Secondary | ICD-10-CM

## 2018-06-25 DIAGNOSIS — N184 Chronic kidney disease, stage 4 (severe): Secondary | ICD-10-CM

## 2018-06-25 DIAGNOSIS — E1142 Type 2 diabetes mellitus with diabetic polyneuropathy: Secondary | ICD-10-CM

## 2018-06-25 DIAGNOSIS — M1A09X0 Idiopathic chronic gout, multiple sites, without tophus (tophi): Secondary | ICD-10-CM | POA: Diagnosis not present

## 2018-06-25 DIAGNOSIS — L97511 Non-pressure chronic ulcer of other part of right foot limited to breakdown of skin: Secondary | ICD-10-CM

## 2018-06-25 DIAGNOSIS — B351 Tinea unguium: Secondary | ICD-10-CM

## 2018-06-25 DIAGNOSIS — E43 Unspecified severe protein-calorie malnutrition: Secondary | ICD-10-CM

## 2018-06-25 MED ORDER — ALLOPURINOL 100 MG PO TABS: 100.0000 mg | ORAL_TABLET | Freq: Every day | ORAL | 11 refills | Status: DC

## 2018-06-26 ENCOUNTER — Encounter (INDEPENDENT_AMBULATORY_CARE_PROVIDER_SITE_OTHER): Payer: Self-pay | Admitting: Physician Assistant

## 2018-06-26 NOTE — Progress Notes (Signed)
Office Visit Note   Patient: Carla Compton           Date of Birth: [birth year]           MRN: [id number] Visit Date: 06/25/2018              Requested by: Abigail Miyamoto, [place] [address] PCP: Abigail Miyamoto, [place]  Chief Complaint  Patient presents with  . Left Foot - Pain, Follow-up  . Right Foot - Pain, Follow-up      HPI: The patient is a 38 yo woman who is seen for hospital follow-up a blister over the fifth metatarsal head of the right foot.  She has diabetic insensate neuropathy as well as renal and heart failure during her hospitalization.  She also had a uric acid level of 8.7 and was felt to have gout and was treated with colchicine for 5 days.  She has not started on allopurinol.  She had an MRI scan done while hospitalized due to concerns of osteomyelitis and [name] did not feel like the skin was consistent with osteomyelitis.  She does have onychomycotic nails as well.  She reports no pain over the blister of her right lateral foot and she does have a small dark eschar over the dorsal surface of the left fifth toe which is nonpainful as well.  Assessment & Plan: Visit Diagnoses:  1. Non-pressure chronic ulcer of other part of right foot limited to breakdown of skin ([place])   2. Idiopathic chronic gout of multiple sites without tophus   3. Diabetic polyneuropathy associated with type 2 diabetes mellitus ([place])   4. CKD (chronic kidney disease) stage 4, GFR 15-29 ml/min (HCC)   5. Acute on chronic combined systolic and diastolic CHF (congestive heart failure) ([place])   6. Severe protein-calorie malnutrition ([place])   7. Fungal nail infection     Plan:  Recommend she continues on Allopurinol 100 mg BID, Achilles stretching, and okay for stationary bike or elliptical .  Patient was counseled and Achilles stretching as well.  Will plan to see back in 4 weeks with recheck of Uric Acid at that time.   Follow-Up Instructions: Return in about 4 weeks  (around 07/23/2018).   Ortho Exam  Patient is alert, oriented, no adenopathy, well-dressed, normal affect, normal respiratory effort. The right lateral foot with dark dry blister over the right 5th Metatarsal head, no signs of cellulitis or infection of the right foot/calf. Good pedal pulses. The left foot has a 3 mm tiny dark eschar dorsal surface of the left 5th toe without signs of infection or cellulitis. Good pedal pulses.  Achilles tight bilaterally. Onchomycotic toenails.   Imaging: No results found. No images are attached to the encounter.  Labs: Lab Results  Component Value Date   HGBA1C 7.2 (H) 05/26/2018   ESRSEDRATE 138 (H) 06/10/2018   ESRSEDRATE 135 (H) 06/07/2018   CRP 5.2 (H) 06/10/2018   CRP 14.8 (H) 06/07/2018   LABURIC 8.7 (H) 06/09/2018     Lab Results  Component Value Date   ALBUMIN 2.2 (L) 06/14/2018   ALBUMIN 2.3 (L) 06/13/2018   ALBUMIN 2.2 (L) 06/12/2018   LABURIC 8.7 (H) 06/09/2018    Body mass index is 46.6 kg/m.  Orders:  No orders of the defined types were placed in this encounter.  Meds ordered this encounter  Medications  . allopurinol (ZYLOPRIM) 100 MG tablet    Sig: Take 1 tablet (100 mg total) by mouth daily.  Dispense:  30 tablet    Refill:  11     Procedures: No procedures performed  Clinical Data: No additional findings.  ROS:  All other systems negative, except as noted in the HPI. Review of Systems  Objective: Vital Signs: Ht 5\' 2"  (1.575 m)   Wt 254 lb 12.8 oz (115.6 kg)   LMP 05/29/2018   BMI 46.60 kg/m   Specialty Comments:  No specialty comments available.  PMFS History: Patient Active Problem List   Diagnosis Date Noted  . Swelling   . AKI (acute kidney injury) ([place])   . Diabetic polyneuropathy associated with type 2 diabetes mellitus ([place])   . Severe protein-calorie malnutrition ([place])   . Acute on chronic combined systolic and diastolic CHF (congestive heart failure) ([place]) 06/05/2018  . CKD  (chronic kidney disease) stage 4, GFR 15-29 ml/min (HCC) 05/26/2018  . Acute systolic (congestive) heart failure ([place]) 05/24/2018  . Uncontrolled secondary diabetes mellitus with stage 4 CKD (GFR 15-29) ([place]) 05/24/2018  . Secondary DM with CKD stage 4 and hypertension ([place]) 05/24/2018  . ARF (acute renal failure) ([place]) 05/24/2018  . Normocytic anemia 05/24/2018   Past Medical History:  Diagnosis Date  . Benign essential HTN   . CKD (chronic kidney disease) stage 3, GFR 30-59 ml/min (HCC)   . Diabetes ([place])     Family History  Problem Relation Age of Onset  . Mitral valve prolapse Mother   . Hypertension Mother   . Hypertension Father   . Diabetes Father   . Diabetes Maternal Grandmother     History reviewed. No pertinent surgical history. Social History   Occupational History  . Not on file  Tobacco Use  . Smoking status: Never Smoker  . Smokeless tobacco: Never Used  Substance and Sexual Activity  . Alcohol use: Never    Frequency: Never  . Drug use: Never  . Sexual activity: Not on file

## 2018-06-29 ENCOUNTER — Encounter (INDEPENDENT_AMBULATORY_CARE_PROVIDER_SITE_OTHER): Payer: Self-pay | Admitting: Physician Assistant

## 2018-07-17 ENCOUNTER — Telehealth: Payer: Self-pay | Admitting: Internal Medicine

## 2018-07-17 NOTE — Telephone Encounter (Signed)
Received records from [place] on 07/17/18, Appt 08/24/18 @ 8:30AM.[place]

## 2018-07-23 ENCOUNTER — Ambulatory Visit (INDEPENDENT_AMBULATORY_CARE_PROVIDER_SITE_OTHER): Payer: BLUE CROSS/BLUE SHIELD | Admitting: Orthopedic Surgery

## 2018-08-04 ENCOUNTER — Encounter (INDEPENDENT_AMBULATORY_CARE_PROVIDER_SITE_OTHER): Payer: Self-pay | Admitting: Orthopedic Surgery

## 2018-08-04 ENCOUNTER — Ambulatory Visit (INDEPENDENT_AMBULATORY_CARE_PROVIDER_SITE_OTHER): Payer: BLUE CROSS/BLUE SHIELD | Admitting: Orthopedic Surgery

## 2018-08-04 DIAGNOSIS — M1A09X0 Idiopathic chronic gout, multiple sites, without tophus (tophi): Secondary | ICD-10-CM | POA: Diagnosis not present

## 2018-08-04 DIAGNOSIS — E43 Unspecified severe protein-calorie malnutrition: Secondary | ICD-10-CM | POA: Diagnosis not present

## 2018-08-04 DIAGNOSIS — E1142 Type 2 diabetes mellitus with diabetic polyneuropathy: Secondary | ICD-10-CM

## 2018-08-04 MED ORDER — ALLOPURINOL 100 MG PO TABS: 100.0000 mg | ORAL_TABLET | Freq: Two times a day (BID) | ORAL | 6 refills | Status: DC

## 2018-08-04 NOTE — Progress Notes (Signed)
Office Visit Note   Patient: Carla Compton           Date of Birth: 1981-02-04           MRN: [id number] Visit Date: 08/04/2018              Requested by: Abigail Miyamoto, [place] [address] PCP: Abigail Miyamoto, [place]  Chief Complaint  Patient presents with  . Right Foot - Pain      HPI: Patient is a 38 year old woman who is seen in follow-up for gout.  Her previous uric acid was 8.7.  Patient states that since taking allopurinol once a day her symptoms have almost completely resolved and she states she was previously treated for rheumatoid arthritis and the medicines for that did not provide any relief  Assessment & Plan: Visit Diagnoses:  1. Diabetic polyneuropathy associated with type 2 diabetes mellitus ([place])   2. Idiopathic chronic gout of multiple sites without tophus   3. Severe protein-calorie malnutrition ([place])     Plan: Will repeat uric acid level today and adjust her allopurinol accordingly.  A new prescription was called in for the allopurinol.  Reevaluate in 3 months with repeat uric acid level.  Follow-Up Instructions: Return in about 3 months (around 11/02/2018).   Ortho Exam  Patient is alert, oriented, no adenopathy, well-dressed, normal affect, normal respiratory effort.  On examination patient has good pulses her feet are plantigrade there are no plantar ulcers there is no redness no cellulitis no pain with range of motion.  She does have some bony spurs the MTP joint of the right great toe but this is asymptomatic.  Patient states she is quite pleased with her resolution of symptoms. .  Imaging: No results found. No images are attached to the encounter.  Labs: Lab Results  Component Value Date   HGBA1C 7.2 (H) 05/26/2018   ESRSEDRATE 138 (H) 06/10/2018   ESRSEDRATE 135 (H) 06/07/2018   CRP 5.2 (H) 06/10/2018   CRP 14.8 (H) 06/07/2018   LABURIC 8.7 (H) 06/09/2018     Lab Results  Component Value Date   ALBUMIN 2.2 (L)  06/14/2018   ALBUMIN 2.3 (L) 06/13/2018   ALBUMIN 2.2 (L) 06/12/2018   LABURIC 8.7 (H) 06/09/2018    There is no height or weight on file to calculate BMI.  Orders:  Orders Placed This Encounter  Procedures  . Uric acid   Meds ordered this encounter  Medications  . allopurinol (ZYLOPRIM) 100 MG tablet    Sig: Take 1 tablet (100 mg total) by mouth 2 (two) times daily.    Dispense:  60 tablet    Refill:  6     Procedures: No procedures performed  Clinical Data: No additional findings.  ROS:  All other systems negative, except as noted in the HPI. Review of Systems  Objective: Vital Signs: There were no vitals taken for this visit.  Specialty Comments:  No specialty comments available.  PMFS History: Patient Active Problem List   Diagnosis Date Noted  . Idiopathic chronic gout of multiple sites without tophus 08/09/2018  . Swelling   . AKI (acute kidney injury) ([place])   . Diabetic polyneuropathy associated with type 2 diabetes mellitus ([place])   . Severe protein-calorie malnutrition ([place])   . Acute on chronic combined systolic and diastolic CHF (congestive heart failure) ([place]) 06/05/2018  . CKD (chronic kidney disease) stage 4, GFR 15-29 ml/min (HCC) 05/26/2018  . Acute systolic (congestive) heart failure (HCC)  05/24/2018  . Uncontrolled secondary diabetes mellitus with stage 4 CKD (GFR 15-29) ([place]) 05/24/2018  . Secondary DM with CKD stage 4 and hypertension ([place]) 05/24/2018  . ARF (acute renal failure) ([place]) 05/24/2018  . Normocytic anemia 05/24/2018   Past Medical History:  Diagnosis Date  . Benign essential HTN   . CKD (chronic kidney disease) stage 3, GFR 30-59 ml/min (HCC)   . Diabetes ([place])     Family History  Problem Relation Age of Onset  . Mitral valve prolapse Mother   . Hypertension Mother   . Hypertension Father   . Diabetes Father   . Diabetes Maternal Grandmother     History reviewed. No pertinent surgical history. Social History    Occupational History  . Not on file  Tobacco Use  . Smoking status: Never Smoker  . Smokeless tobacco: Never Used  Substance and Sexual Activity  . Alcohol use: Never    Frequency: Never  . Drug use: Never  . Sexual activity: Not on file

## 2018-08-05 ENCOUNTER — Telehealth: Payer: Self-pay | Admitting: Internal Medicine

## 2018-08-05 DIAGNOSIS — Z79899 Other long term (current) drug therapy: Secondary | ICD-10-CM

## 2018-08-05 NOTE — Telephone Encounter (Signed)
Returned call to pt. She states she has been experiencing SOB x 3 days and abd/leg swelling. She also report a 4 lb weight increase within a week. Information presented to [facility], [place] who recommend pt come today for lab work before we can make med adjustments. Pt verbalized understanding and states she will come today.

## 2018-08-05 NOTE — Telephone Encounter (Signed)
° ° °  Pt c/o Shortness Of Breath: STAT if SOB developed within the last 24 hours or pt is noticeably SOB on the phone  1. Are you currently SOB (can you hear that pt is SOB on the phone)? NO  2. How long have you been experiencing SOB? 3 DAYS  3. Are you SOB when sitting or when up moving around?  MOVING AROUND  4. Are you currently experiencing any other symptoms?  SWELLING IN  ABDOMEN AREA

## 2018-08-06 ENCOUNTER — Telehealth: Payer: Self-pay | Admitting: Internal Medicine

## 2018-08-06 LAB — BASIC METABOLIC PANEL
BUN/Creatinine Ratio: 10 (ref 9–23)
BUN: 36 mg/dL — ABNORMAL HIGH (ref 6–20)
CO2: 17 mmol/L — ABNORMAL LOW (ref 20–29)
Calcium: 7.8 mg/dL — ABNORMAL LOW (ref 8.7–10.2)
Chloride: 109 mmol/L — ABNORMAL HIGH (ref 96–106)
Creatinine, Ser: 3.76 mg/dL — ABNORMAL HIGH (ref 0.57–1.00)
GFR calc Af Amer: 17 mL/min/{1.73_m2} — ABNORMAL LOW (ref >59)
GFR calc non Af Amer: 15 mL/min/{1.73_m2} — ABNORMAL LOW (ref >59)
Glucose: 173 mg/dL — ABNORMAL HIGH (ref 65–99)
Potassium: 5 mmol/L (ref 3.5–5.2)
Sodium: 139 mmol/L (ref 134–144)

## 2018-08-06 NOTE — Telephone Encounter (Signed)
Duplicate encounter. Almyra Deforest, PA-C spoke with patient at 12:10PM 08-06-2018

## 2018-08-06 NOTE — Telephone Encounter (Signed)
Returned call to patient lab results not available.Advised we will call back after Almyra Deforest PA reviews.

## 2018-08-06 NOTE — Telephone Encounter (Signed)
I have personally called the patient and reviewed her lab with her. I instructed her to take 2 extra tablet of torsemide (2 of 20mg  = 40mg ) in the afternoon for today and tomorrow only. That is on top of her 80mg  torsemide in the morning. After tomorrow, she will go back down to 80mg  daily and let us know if she is feeling better.   Her main issue is dyspnea with exertion, 4 lbs weight gain and abdominal distension

## 2018-08-06 NOTE — Telephone Encounter (Signed)
Follow Up:     Pt said she had her lab work yesterday, still  Having Shortness of Breath, not as bad. She wants to know what she needs to do next?

## 2018-08-06 NOTE — Telephone Encounter (Signed)
New Message   Patient needs results to Lab work.

## 2018-08-09 DIAGNOSIS — M1A09X0 Idiopathic chronic gout, multiple sites, without tophus (tophi): Secondary | ICD-10-CM | POA: Insufficient documentation

## 2018-08-10 ENCOUNTER — Telehealth: Payer: Self-pay | Admitting: Internal Medicine

## 2018-08-10 ENCOUNTER — Telehealth (INDEPENDENT_AMBULATORY_CARE_PROVIDER_SITE_OTHER): Payer: Self-pay

## 2018-08-10 NOTE — Telephone Encounter (Signed)
-----   Message from Newt Minion, MD sent at 08/09/2018 11:28 AM EST ----- Did we obtain the uric acid ----- Message ----- From: SYSTEM Sent: 08/09/2018  12:04 AM EST To: Newt Minion, MD

## 2018-08-10 NOTE — Telephone Encounter (Signed)
I called patient to see if she followed up but she hasn't . She stated she will come over to [facility] tomorrow since living in [place] and take care of it. I will continue to follow-up with her.

## 2018-08-10 NOTE — Progress Notes (Signed)
I called patient to see if she followed up but she hasn't . She stated she will come over to [facility] tomorrow since living in [place] and take care of it. I will continue to follow-up with her.

## 2018-08-10 NOTE — Telephone Encounter (Signed)
New Message   PT is returning phone call

## 2018-08-11 NOTE — Telephone Encounter (Signed)
Unsure of why patient is calling. Carla Compton spoke with patient last week. Left message for patient to contact office.

## 2018-08-12 ENCOUNTER — Other Ambulatory Visit: Payer: Self-pay

## 2018-08-12 ENCOUNTER — Inpatient Hospital Stay (HOSPITAL_COMMUNITY)
Admission: EM | Admit: 2018-08-12 | Discharge: 2018-08-16 | DRG: 291 | Disposition: A | Discharge status: Home / Self Care | Payer: BLUE CROSS/BLUE SHIELD | Attending: Internal Medicine | Admitting: Internal Medicine

## 2018-08-12 ENCOUNTER — Encounter (HOSPITAL_COMMUNITY): Payer: Self-pay | Admitting: Emergency Medicine

## 2018-08-12 ENCOUNTER — Emergency Department (HOSPITAL_COMMUNITY): Payer: BLUE CROSS/BLUE SHIELD

## 2018-08-12 DIAGNOSIS — I130 Hypertensive heart and chronic kidney disease with heart failure and stage 1 through stage 4 chronic kidney disease, or unspecified chronic kidney disease: Secondary | ICD-10-CM | POA: Diagnosis present

## 2018-08-12 DIAGNOSIS — E1121 Type 2 diabetes mellitus with diabetic nephropathy: Secondary | ICD-10-CM

## 2018-08-12 DIAGNOSIS — B9562 Methicillin resistant Staphylococcus aureus infection as the cause of diseases classified elsewhere: Secondary | ICD-10-CM | POA: Diagnosis present

## 2018-08-12 DIAGNOSIS — Z79899 Other long term (current) drug therapy: Secondary | ICD-10-CM | POA: Diagnosis not present

## 2018-08-12 DIAGNOSIS — N179 Acute kidney failure, unspecified: Secondary | ICD-10-CM | POA: Diagnosis present

## 2018-08-12 DIAGNOSIS — E872 Acidosis: Secondary | ICD-10-CM | POA: Diagnosis present

## 2018-08-12 DIAGNOSIS — N184 Chronic kidney disease, stage 4 (severe): Secondary | ICD-10-CM | POA: Diagnosis present

## 2018-08-12 DIAGNOSIS — E1142 Type 2 diabetes mellitus with diabetic polyneuropathy: Secondary | ICD-10-CM | POA: Diagnosis present

## 2018-08-12 DIAGNOSIS — I5043 Acute on chronic combined systolic (congestive) and diastolic (congestive) heart failure: Secondary | ICD-10-CM | POA: Diagnosis present

## 2018-08-12 DIAGNOSIS — R0602 Shortness of breath: Secondary | ICD-10-CM | POA: Diagnosis not present

## 2018-08-12 DIAGNOSIS — M1A09X0 Idiopathic chronic gout, multiple sites, without tophus (tophi): Secondary | ICD-10-CM | POA: Diagnosis present

## 2018-08-12 DIAGNOSIS — Z8249 Family history of ischemic heart disease and other diseases of the circulatory system: Secondary | ICD-10-CM

## 2018-08-12 DIAGNOSIS — D631 Anemia in chronic kidney disease: Secondary | ICD-10-CM | POA: Diagnosis present

## 2018-08-12 DIAGNOSIS — E1365 Other specified diabetes mellitus with hyperglycemia: Secondary | ICD-10-CM | POA: Diagnosis not present

## 2018-08-12 DIAGNOSIS — J810 Acute pulmonary edema: Secondary | ICD-10-CM | POA: Diagnosis not present

## 2018-08-12 DIAGNOSIS — Z6841 Body Mass Index (BMI) 40.0 and over, adult: Secondary | ICD-10-CM

## 2018-08-12 DIAGNOSIS — N2581 Secondary hyperparathyroidism of renal origin: Secondary | ICD-10-CM | POA: Diagnosis present

## 2018-08-12 DIAGNOSIS — E785 Hyperlipidemia, unspecified: Secondary | ICD-10-CM | POA: Diagnosis present

## 2018-08-12 DIAGNOSIS — Z7982 Long term (current) use of aspirin: Secondary | ICD-10-CM | POA: Diagnosis not present

## 2018-08-12 DIAGNOSIS — Z91041 Radiographic dye allergy status: Secondary | ICD-10-CM | POA: Diagnosis not present

## 2018-08-12 DIAGNOSIS — J9601 Acute respiratory failure with hypoxia: Secondary | ICD-10-CM | POA: Diagnosis present

## 2018-08-12 DIAGNOSIS — E1322 Other specified diabetes mellitus with diabetic chronic kidney disease: Secondary | ICD-10-CM

## 2018-08-12 DIAGNOSIS — IMO0002 Reserved for concepts with insufficient information to code with codable children: Secondary | ICD-10-CM | POA: Diagnosis present

## 2018-08-12 DIAGNOSIS — L02512 Cutaneous abscess of left hand: Secondary | ICD-10-CM | POA: Diagnosis present

## 2018-08-12 DIAGNOSIS — Z833 Family history of diabetes mellitus: Secondary | ICD-10-CM | POA: Diagnosis not present

## 2018-08-12 DIAGNOSIS — E1122 Type 2 diabetes mellitus with diabetic chronic kidney disease: Secondary | ICD-10-CM | POA: Diagnosis present

## 2018-08-12 DIAGNOSIS — N183 Chronic kidney disease, stage 3 (moderate): Secondary | ICD-10-CM

## 2018-08-12 HISTORY — DX: Anemia, unspecified: D64.9

## 2018-08-12 HISTORY — DX: Heart failure, unspecified: I50.9

## 2018-08-12 LAB — COMPREHENSIVE METABOLIC PANEL
ALT: 29 U/L (ref 0–44)
AST: 26 U/L (ref 15–41)
Albumin: 2.5 g/dL — ABNORMAL LOW (ref 3.5–5.0)
Alkaline Phosphatase: 168 U/L — ABNORMAL HIGH (ref 38–126)
Anion gap: 7 (ref 5–15)
BILIRUBIN TOTAL: 0.5 mg/dL (ref 0.3–1.2)
BUN: 43 mg/dL — ABNORMAL HIGH (ref 6–20)
CALCIUM: 8 mg/dL — AB (ref 8.9–10.3)
CO2: 18 mmol/L — ABNORMAL LOW (ref 22–32)
Chloride: 113 mmol/L — ABNORMAL HIGH (ref 98–111)
Creatinine, Ser: 4.31 mg/dL — ABNORMAL HIGH (ref 0.44–1.00)
GFR calc Af Amer: 14 mL/min — ABNORMAL LOW (ref >60)
GFR calc non Af Amer: 12 mL/min — ABNORMAL LOW (ref >60)
Glucose, Bld: 124 mg/dL — ABNORMAL HIGH (ref 70–99)
Potassium: 4.5 mmol/L (ref 3.5–5.1)
Sodium: 138 mmol/L (ref 135–145)
Total Protein: 7.2 g/dL (ref 6.5–8.1)

## 2018-08-12 LAB — CBC WITH DIFFERENTIAL/PLATELET
Abs Immature Granulocytes: 0.01 10*3/uL (ref 0.00–0.07)
Basophils Absolute: 0 10*3/uL (ref 0.0–0.1)
Basophils Relative: 1 %
Eosinophils Absolute: 0.5 10*3/uL (ref 0.0–0.5)
Eosinophils Relative: 13 %
HCT: 28.6 % — ABNORMAL LOW (ref 36.0–46.0)
Hemoglobin: 9 g/dL — ABNORMAL LOW (ref 12.0–15.0)
Immature Granulocytes: 0 %
Lymphocytes Relative: 29 %
Lymphs Abs: 1.2 10*3/uL (ref 0.7–4.0)
MCH: 28.6 pg (ref 26.0–34.0)
MCHC: 31.5 g/dL (ref 30.0–36.0)
MCV: 90.8 fL (ref 80.0–100.0)
Monocytes Absolute: 0.2 10*3/uL (ref 0.1–1.0)
Monocytes Relative: 6 %
Neutro Abs: 2 10*3/uL (ref 1.7–7.7)
Neutrophils Relative %: 51 %
Platelets: 275 10*3/uL (ref 150–400)
RBC: 3.15 MIL/uL — ABNORMAL LOW (ref 3.87–5.11)
RDW: 15.9 % — ABNORMAL HIGH (ref 11.5–15.5)
WBC: 4 10*3/uL (ref 4.0–10.5)
nRBC: 0 % (ref 0.0–0.2)

## 2018-08-12 LAB — I-STAT BETA HCG BLOOD, ED (MC, WL, AP ONLY): I-stat hCG, quantitative: <5 m[IU]/mL (ref <5)

## 2018-08-12 LAB — BRAIN NATRIURETIC PEPTIDE: B Natriuretic Peptide: 436.1 pg/mL — ABNORMAL HIGH (ref 0.0–100.0)

## 2018-08-12 MED ORDER — SODIUM BICARBONATE 650 MG PO TABS
650.0000 mg | ORAL_TABLET | Freq: Every day | ORAL | Status: DC
  Administered 2018-08-13: 650 mg via ORAL
  Filled 2018-08-12: qty 1

## 2018-08-12 MED ORDER — ALLOPURINOL 100 MG PO TABS
100.0000 mg | ORAL_TABLET | Freq: Every day | ORAL | Status: DC
  Administered 2018-08-13 – 2018-08-16 (×4): 100 mg via ORAL
  Filled 2018-08-12 (×4): qty 1

## 2018-08-12 MED ORDER — FUROSEMIDE 10 MG/ML IJ SOLN
40.0000 mg | Freq: Once | INTRAMUSCULAR | Status: AC
  Administered 2018-08-12: 40 mg via INTRAVENOUS
  Filled 2018-08-12: qty 4

## 2018-08-12 MED ORDER — INSULIN ASPART 100 UNIT/ML ~~LOC~~ SOLN
0.0000 [IU] | Freq: Three times a day (TID) | SUBCUTANEOUS | Status: DC
  Administered 2018-08-13 – 2018-08-15 (×2): 1 [IU] via SUBCUTANEOUS

## 2018-08-12 MED ORDER — HEPARIN SODIUM (PORCINE) 5000 UNIT/ML IJ SOLN
5000.0000 [IU] | Freq: Three times a day (TID) | INTRAMUSCULAR | Status: DC
  Administered 2018-08-12 – 2018-08-16 (×11): 5000 [IU] via SUBCUTANEOUS
  Filled 2018-08-12 (×11): qty 1

## 2018-08-12 MED ORDER — ASPIRIN EC 81 MG PO TBEC
81.0000 mg | DELAYED_RELEASE_TABLET | Freq: Every day | ORAL | Status: DC
  Administered 2018-08-13 – 2018-08-16 (×4): 81 mg via ORAL
  Filled 2018-08-12 (×4): qty 1

## 2018-08-12 MED ORDER — ONDANSETRON HCL 4 MG/2ML IJ SOLN: 4.0000 mg | Freq: Four times a day (QID) | INTRAMUSCULAR | Status: DC | PRN

## 2018-08-12 MED ORDER — ACETAMINOPHEN 650 MG RE SUPP: 650.0000 mg | Freq: Four times a day (QID) | RECTAL | Status: DC | PRN

## 2018-08-12 MED ORDER — ACETAMINOPHEN 325 MG PO TABS: 650.0000 mg | ORAL_TABLET | Freq: Four times a day (QID) | ORAL | Status: DC | PRN

## 2018-08-12 MED ORDER — CARVEDILOL 25 MG PO TABS
25.0000 mg | ORAL_TABLET | Freq: Two times a day (BID) | ORAL | Status: DC
  Administered 2018-08-13 – 2018-08-16 (×7): 25 mg via ORAL
  Filled 2018-08-12: qty 1
  Filled 2018-08-12: qty 2
  Filled 2018-08-12 (×5): qty 1

## 2018-08-12 MED ORDER — ROSUVASTATIN CALCIUM 20 MG PO TABS
40.0000 mg | ORAL_TABLET | Freq: Every day | ORAL | Status: DC
  Administered 2018-08-13 – 2018-08-15 (×3): 40 mg via ORAL
  Filled 2018-08-12 (×3): qty 2

## 2018-08-12 MED ORDER — ONDANSETRON HCL 4 MG PO TABS: 4.0000 mg | ORAL_TABLET | Freq: Four times a day (QID) | ORAL | Status: DC | PRN

## 2018-08-12 MED ORDER — FUROSEMIDE 10 MG/ML IJ SOLN
80.0000 mg | Freq: Two times a day (BID) | INTRAMUSCULAR | Status: DC
  Administered 2018-08-13 – 2018-08-14 (×3): 80 mg via INTRAVENOUS
  Filled 2018-08-12 (×3): qty 8

## 2018-08-12 MED ORDER — HYDRALAZINE HCL 50 MG PO TABS
50.0000 mg | ORAL_TABLET | Freq: Three times a day (TID) | ORAL | Status: DC
  Administered 2018-08-12 – 2018-08-16 (×11): 50 mg via ORAL
  Filled 2018-08-12: qty 2
  Filled 2018-08-12 (×5): qty 1
  Filled 2018-08-12: qty 2
  Filled 2018-08-12 (×4): qty 1

## 2018-08-12 NOTE — ED Triage Notes (Signed)
Pt reports SOB for approximately 1 week, worse upon exertion. Pt also reports a productive cough with green sputum.

## 2018-08-12 NOTE — H&P (Addendum)
History and Physical    Carla Compton NUU:[id number] DOB: 1980/08/14 DOA: 08/12/2018  PCP: Abigail Miyamoto, FNP  Patient coming from: Home.  Chief Complaint: Shortness of breath.  HPI: Carla Compton is a 38 y.o. female with history of chronic systolic and diastolic CHF last EF measured in December 2019 2 months ago was 45 to 50% with grade 2 diastolic dysfunction and chronic kidney disease stage IV, chronic anemia, diabetes mellitus type 2, history of gout, hypertension presents to the ER because of increasing shortness of breath.  Patient states over the last 3 to 4 days patient has been getting increasingly short of breath using increased pillow with orthopnea and exertional dyspnea denies any chest pain productive cough fever or chills.  Patient states she has been compliant with her torsemide.  ED Course: In the ER chest x-ray shows congestion.  Patient has mild edema of the lower extremity.  Weight has not gained from previous discharge.  But on exam patient has features consistent with acute CHF.  Creatinine is increased from baseline of 3.7-4.3 now.  EKG shows normal sinus rhythm.  BNP 436.  Patient was given Lasix of 80 mg IV and admitted for acute on chronic combined CHF with progressive renal failure.  In addition patient's blood pressure is also found to be elevated.  Review of Systems: As per HPI, rest all negative.   Past Medical History:  Diagnosis Date  . Benign essential HTN   . CKD (chronic kidney disease) stage 3, GFR 30-59 ml/min (HCC)   . Diabetes ([place])     History reviewed. No pertinent surgical history.   reports that she has never smoked. She has never used smokeless tobacco. She reports that she does not drink alcohol or use drugs.  Allergies  Allergen Reactions  . Contrast Media [Iodinated Diagnostic Agents]     Family History  Problem Relation Age of Onset  . Mitral valve prolapse Mother   . Hypertension Mother   . Hypertension Father   .  Diabetes Father   . Diabetes Maternal Grandmother     Prior to Admission medications   Medication Sig Start Date End Date Taking? Authorizing Provider  albuterol (PROAIR HFA) 108 (90 Base) MCG/ACT inhaler Inhale 2 puffs into the lungs every 4 (four) hours as needed for wheezing or shortness of breath.   Yes [provider]  allopurinol (ZYLOPRIM) 100 MG tablet Take 1 tablet (100 mg total) by mouth daily. 06/25/18  Yes Rayburn, Neta Mends, PA-C  aspirin EC 81 MG EC tablet Take 1 tablet (81 mg total) by mouth daily. 06/15/18  Yes Kayleen Memos, DO  carvedilol (COREG) 25 MG tablet Take 1 tablet (25 mg total) by mouth 2 (two) times daily with a meal. 06/14/18  Yes Irene Pap N, DO  hydrALAZINE (APRESOLINE) 50 MG tablet Take 1 tablet (50 mg total) by mouth 3 (three) times daily. 06/23/18  Yes Kroeger, Daleen Snook M., PA-C  rosuvastatin (CRESTOR) 40 MG tablet Take 40 mg by mouth at bedtime. 03/23/18  Yes [provider]  sodium bicarbonate 650 MG tablet Take 1 tablet (650 mg total) by mouth daily. 06/14/18  Yes Kayleen Memos, DO  torsemide (DEMADEX) 20 MG tablet Take 4 tablets (80 mg total) by mouth daily. 06/15/18  Yes Kayleen Memos, DO  allopurinol (ZYLOPRIM) 100 MG tablet Take 1 tablet (100 mg total) by mouth 2 (two) times daily. Patient not taking: Reported on 08/12/2018 08/04/18   Newt Minion, MD  Physical Exam: Vitals:   08/12/18 2030 08/12/18 2100 08/12/18 2130 08/12/18 2200  BP: (!) 167/106 (!) 159/95 (!) 167/104 (!) 169/102  Pulse: 85 83 85 82  Resp: 18 18 18 18   Temp:      TempSrc:      SpO2: 97% 96% 98% 96%  Weight:      Height:          Constitutional: Moderately built and nourished. Vitals:   08/12/18 2030 08/12/18 2100 08/12/18 2130 08/12/18 2200  BP: (!) 167/106 (!) 159/95 (!) 167/104 (!) 169/102  Pulse: 85 83 85 82  Resp: 18 18 18 18   Temp:      TempSrc:      SpO2: 97% 96% 98% 96%  Weight:      Height:       Eyes: Anicteric no  pallor. ENMT: No discharge from the ears eyes nose or mouth. Neck: JVD elevated no mass felt. Respiratory: No rhonchi or crepitations. Cardiovascular: S1-S2 heard. Abdomen: Soft nontender bowel sounds present. Musculoskeletal: Mild edema. Skin: Chronic skin changes right foot. Neurologic: Alert awake oriented to time place and person.  Moves all extremities. Psychiatric: Appears normal per normal affect.   Labs on Admission: I have personally reviewed following labs and imaging studies  CBC: Recent Labs  Lab 08/12/18 1858  WBC 4.0  NEUTROABS 2.0  HGB 9.0*  HCT 28.6*  MCV 90.8  PLT 962   Basic Metabolic Panel: Recent Labs  Lab 08/12/18 1858  NA 138  K 4.5  CL 113*  CO2 18*  GLUCOSE 124*  BUN 43*  CREATININE 4.31*  CALCIUM 8.0*   GFR: Estimated Creatinine Clearance: 21.9 mL/min (A) (by C-G formula based on SCr of 4.31 mg/dL (H)). Liver Function Tests: Recent Labs  Lab 08/12/18 1858  AST 26  ALT 29  ALKPHOS 168*  BILITOT 0.5  PROT 7.2  ALBUMIN 2.5*   No results for input(s): LIPASE, AMYLASE in the last 168 hours. No results for input(s): AMMONIA in the last 168 hours. Coagulation Profile: No results for input(s): INR, PROTIME in the last 168 hours. Cardiac Enzymes: No results for input(s): CKTOTAL, CKMB, CKMBINDEX, TROPONINI in the last 168 hours. BNP (last 3 results) No results for input(s): PROBNP in the last 8760 hours. HbA1C: No results for input(s): HGBA1C in the last 72 hours. CBG: No results for input(s): GLUCAP in the last 168 hours. Lipid Profile: No results for input(s): CHOL, HDL, LDLCALC, TRIG, CHOLHDL, LDLDIRECT in the last 72 hours. Thyroid Function Tests: No results for input(s): TSH, T4TOTAL, FREET4, T3FREE, THYROIDAB in the last 72 hours. Anemia Panel: No results for input(s): VITAMINB12, FOLATE, FERRITIN, TIBC, IRON, RETICCTPCT in the last 72 hours. Urine analysis: No results found for: COLORURINE, APPEARANCEUR, LABSPEC, PHURINE,  GLUCOSEU, HGBUR, BILIRUBINUR, KETONESUR, PROTEINUR, UROBILINOGEN, NITRITE, LEUKOCYTESUR Sepsis Labs: @LABRCNTIP (procalcitonin:4,lacticidven:4) )No results found for this or any previous visit (from the past 240 hour(s)).   Radiological Exams on Admission: Dg Chest 2 View  Result Date: 08/12/2018 CLINICAL DATA:  Shortness of breath with exertion, orthopnea. History of diabetes, hypertension and CHF. EXAM: CHEST - 2 VIEW COMPARISON:  Chest radiograph June 06, 2018 FINDINGS: Cardiac silhouette is upper limits of normal in size. Mediastinal silhouette is not suspicious. Interstitial prominence without pleural effusion or focal consolidation. No pneumothorax. Large body habitus. Osseous structures are nonacute. IMPRESSION: 1. Increased interstitial prominence seen with pulmonary edema or atypical infection. 2. Borderline cardiomegaly. Electronically Signed   By: Elon Alas M.D.   On: 08/12/2018 18:04  EKG: Independently reviewed.  Normal sinus rhythm.  Assessment/Plan Principal Problem:   Acute pulmonary edema (HCC) Active Problems:   Uncontrolled secondary diabetes mellitus with stage 4 CKD (GFR 15-29) (HCC)   CKD (chronic kidney disease) stage 4, GFR 15-29 ml/min (HCC)   Acute on chronic combined systolic and diastolic CHF (congestive heart failure) ([place])    1. Acute respiratory failure with hypoxia likely from acute on chronic CHF last EF measured was 45 to 50% with grade 2 diastolic dysfunction with progressive renal failure -patient takes torsemide 80 mg p.o. daily for now patient is kept on Lasix 80 mg IV every 12.  Closely follow intake output daily weights and metabolic panel.  May need nephrology input given the progressive renal failure. 2. Chronic kidney disease stage IV with mild worsening of renal function.  Closely follow metabolic panel. 3. Hypertension uncontrolled likely contributing to patient's symptoms.  In addition to patient's Coreg and hydralazine I have placed  patient on PRN IV hydralazine. 4. Chronic anemia likely from renal disease. 5. Diabetes mellitus type 2 with renal manifestation -patient states she takes Lantus and it only if blood sugar is elevated otherwise she does not take any medication.  We will keep patient on sliding scale coverage.  Last hemoglobin A1c in December 2090 was 7.1. 6. History of gout has had chronic lesion in the right foot.  On allopurinol. 7. Hyperlipidemia on statins.  Chest x-ray differential includes possible infection but patient does not have any symptoms to suggest infection.   DVT prophylaxis: Heparin. Code Status: Full code. Family Communication: Discussed with patient. Disposition Plan: Home. Consults called: None. Admission status: Inpatient.   Rise Patience MD [facility] Pager [phone].  If 7PM-7AM, please contact night-coverage [email or web address] Password [email or web address]  08/12/2018, 11:10 PM

## 2018-08-12 NOTE — ED Provider Notes (Signed)
[facility] EMERGENCY DEPARTMENT Provider Note   CSN: [id number] Arrival date & time: 08/12/18  1721  History   Chief Complaint Chief Complaint  Patient presents with  . Shortness of Breath    HPI Carla Compton is a 38 y.o. female with past medical history significant for CHF, CKD, diabetes who presents for evaluation of shortness of breath.  Patient states she has shortness of breath x1 week.  Worse with exertion and when she lays down.  States she does currently take torsemide, 80 mg total by mouth for her congestive heart failure.  Patient states she has noticed increased lower extremity edema as well.  Denies previous history of asthma or COPD.  Denies fever, chills, nausea, vomiting, chest pain, abdominal pain, diarrhea dysuria.  Has had productive cough of white sputum x3 days.  Also admits to nasal congestion and rhinorrhea.  Has not missed any doses of her torsemide.  Denies additional aggravating or alleviating factors.  States she has had to sleep with 3-4 pillows at night over the last week due to her shortness of breath.   History obtained from patient.  No interpreter was used.     HPI  Past Medical History:  Diagnosis Date  . Benign essential HTN   . CKD (chronic kidney disease) stage 3, GFR 30-59 ml/min (HCC)   . Diabetes [facility])     Patient Active Problem List   Diagnosis Date Noted  . Acute pulmonary edema ([place]) 08/12/2018  . Idiopathic chronic gout of multiple sites without tophus 08/09/2018  . Swelling   . AKI (acute kidney injury) ([place])   . Diabetic polyneuropathy associated with type 2 diabetes mellitus ([place])   . Severe protein-calorie malnutrition ([place])   . Acute on chronic combined systolic and diastolic CHF (congestive heart failure) ([place]) 06/05/2018  . CKD (chronic kidney disease) stage 4, GFR 15-29 ml/min (HCC) 05/26/2018  . Acute systolic (congestive) heart failure ([place]) 05/24/2018  . Uncontrolled secondary diabetes mellitus with  stage 4 CKD (GFR 15-29) ([place]) 05/24/2018  . Secondary DM with CKD stage 4 and hypertension ([place]) 05/24/2018  . ARF (acute renal failure) ([place]) 05/24/2018  . Normocytic anemia 05/24/2018    History reviewed. No pertinent surgical history.   OB History   No obstetric history on file.      Home Medications    Prior to Admission medications   Medication Sig Start Date End Date Taking? Authorizing Provider  albuterol (PROAIR HFA) 108 (90 Base) MCG/ACT inhaler Inhale 2 puffs into the lungs every 4 (four) hours as needed for wheezing or shortness of breath.    [provider]  allopurinol (ZYLOPRIM) 100 MG tablet Take 1 tablet (100 mg total) by mouth daily. 06/25/18   Rayburn, Neta Mends, PA-C  allopurinol (ZYLOPRIM) 100 MG tablet Take 1 tablet (100 mg total) by mouth 2 (two) times daily. 08/04/18   Newt Minion, MD  aspirin EC 81 MG EC tablet Take 1 tablet (81 mg total) by mouth daily. 06/15/18   Kayleen Memos, DO  carvedilol (COREG) 25 MG tablet Take 1 tablet (25 mg total) by mouth 2 (two) times daily with a meal. 06/14/18   Kayleen Memos, DO  hydrALAZINE (APRESOLINE) 50 MG tablet Take 1 tablet (50 mg total) by mouth 3 (three) times daily. 06/23/18   Kroeger, Lorelee Cover., PA-C  rosuvastatin (CRESTOR) 40 MG tablet Take 40 mg by mouth at bedtime. 03/23/18   [provider]  sodium bicarbonate 650 MG tablet Take  1 tablet (650 mg total) by mouth daily. 06/14/18   Kayleen Memos, DO  torsemide (DEMADEX) 20 MG tablet Take 4 tablets (80 mg total) by mouth daily. 06/15/18   Kayleen Memos, DO    Family History Family History  Problem Relation Age of Onset  . Mitral valve prolapse Mother   . Hypertension Mother   . Hypertension Father   . Diabetes Father   . Diabetes Maternal Grandmother     Social History Social History   Tobacco Use  . Smoking status: Never Smoker  . Smokeless tobacco: Never Used  Substance Use Topics  . Alcohol use: Never    Frequency: Never    . Drug use: Never     Allergies   Contrast media [iodinated diagnostic agents]   Review of Systems Review of Systems  Constitutional: Negative.   HENT: Positive for congestion and rhinorrhea. Negative for sinus pressure, sneezing and sore throat.   Respiratory: Positive for cough and shortness of breath. Negative for apnea, choking, chest tightness, wheezing and stridor.   Cardiovascular: Positive for leg swelling. Negative for chest pain and palpitations.  Gastrointestinal: Negative.   Endocrine: Negative.   Genitourinary: Negative.   Musculoskeletal: Negative.   Skin: Negative.   Neurological: Negative.   All other systems reviewed and are negative.    Physical Exam Updated Vital Signs BP (!) 167/106   Pulse 85   Temp 98.2 F (36.8 C) (Oral)   Resp 18   Ht 5\' 2"  (1.575 m)   Wt 115.7 kg   LMP 07/26/2018 (Exact Date)   SpO2 97%   BMI 46.64 kg/m   Physical Exam Vitals signs and nursing note reviewed.  Constitutional:      General: She is not in acute distress.    Appearance: She is well-developed. She is not ill-appearing, toxic-appearing or diaphoretic.  HENT:     Head: Atraumatic.  Eyes:     Pupils: Pupils are equal, round, and reactive to light.  Neck:     Musculoskeletal: Normal range of motion.     Comments: No JVD. Cardiovascular:     Rate and Rhythm: Normal rate.     Pulses: Normal pulses.     Heart sounds: Normal heart sounds.  Pulmonary:     Effort: No respiratory distress.     Comments: Bilateral rales in lower lobes.  Will to speak in full sentences without difficulty.  No hypoxia or tachypnea. Abdominal:     General: There is no distension.     Comments: Soft, Nontender without rebound or guarding.  No evidence of anasarca.  Musculoskeletal: Normal range of motion.     Comments: Moves all extremities without difficulty.  Ambulatory in department that difficulty. Bilateral 2+ lower extremity pitting edema.  Skin:    General: Skin is warm and  dry.     Comments: No erythema, warmth.  Brisk capillary refill.  Neurological:     Mental Status: She is alert.    ED Treatments / Results  Labs (all labs ordered are listed, but only abnormal results are displayed) Labs Reviewed  CBC WITH DIFFERENTIAL/PLATELET - Abnormal; Notable for the following components:      Result Value   RBC 3.15 (*)    Hemoglobin 9.0 (*)    HCT 28.6 (*)    RDW 15.9 (*)    All other components within normal limits  COMPREHENSIVE METABOLIC PANEL - Abnormal; Notable for the following components:   Chloride 113 (*)    CO2 18 (*)  Glucose, Bld 124 (*)    BUN 43 (*)    Creatinine, Ser 4.31 (*)    Calcium 8.0 (*)    Albumin 2.5 (*)    Alkaline Phosphatase 168 (*)    GFR calc non Af Amer 12 (*)    GFR calc Af Amer 14 (*)    All other components within normal limits  BRAIN NATRIURETIC PEPTIDE - Abnormal; Notable for the following components:   B Natriuretic Peptide 436.1 (*)    All other components within normal limits  I-STAT BETA HCG BLOOD, ED ([facility], [facility], [facility] ONLY)    EKG EKG Interpretation  Date/Time:  Wednesday August 12 2018 17:36:59 EST Ventricular Rate:  83 PR Interval:  152 QRS Duration: 78 QT Interval:  388 QTC Calculation: 455 R Axis:   77 Text Interpretation:  Normal sinus rhythm Cannot rule out Anterior infarct , age undetermined Abnormal ECG Confirmed by Dene Gentry [phone]) on 08/12/2018 7:43:12 PM   Radiology Dg Chest 2 View  Result Date: 08/12/2018 CLINICAL DATA:  Shortness of breath with exertion, orthopnea. History of diabetes, hypertension and CHF. EXAM: CHEST - 2 VIEW COMPARISON:  Chest radiograph June 06, 2018 FINDINGS: Cardiac silhouette is upper limits of normal in size. Mediastinal silhouette is not suspicious. Interstitial prominence without pleural effusion or focal consolidation. No pneumothorax. Large body habitus. Osseous structures are nonacute. IMPRESSION: 1. Increased interstitial prominence seen with pulmonary  edema or atypical infection. 2. Borderline cardiomegaly. Electronically Signed   By: Elon Alas M.D.   On: 08/12/2018 18:04    Procedures Procedures (including critical care time)  Medications Ordered in ED Medications  furosemide (LASIX) injection 40 mg (40 mg Intravenous Given 08/12/18 1939)  furosemide (LASIX) injection 40 mg (40 mg Intravenous Given 08/12/18 2104)   Initial Impression / Assessment and Plan / ED Course  I have reviewed the triage vital signs and the nursing notes.  Pertinent labs & imaging results that were available during my care of the patient were reviewed by me and considered in my medical decision making (see chart for details).  38 year old female appears otherwise well presents for evaluation of shortness of breath.  Afebrile, nonseptic, non-ill-appearing.  Hx CHF, currently on torsemide 80 mg daily.  Has had increased lower extremity edema as well as cough productive of white sputum x3 days.  Lungs with bilateral rales in  lower lobes.  No hypoxia, tachycardia or tachypnea.  Chest x-ray with interstitial prominence consistent with possible pulmonary edema versus atypical infectious process.  Given rales on my  exam and history of CHF I believe more likely pulmonary edema.  Will give dose IV Lasix, order labs and reevaluate. Patient does not have established PCP. Does have apt with Nephrology to establish care 08/18/18.  States last time she did see nephrology they had mentioned dialysis, however going to continue to monitor closely.  Does not have fistula or graft placed.   1945: CBC without leukocytosis, hemoglobin 9.0, at patient's baseline, admits to heavy menstrual cycles, HCG negative. Metabolic panel with creatinine 4.31, previous 3.76, 4.24, GFR 14, previous 17, 15 , BNP 436, however this number may be inacurate secondary to patient's morbid obesity.  Continues to be dyspneic with exertion and symptomatic.  Given patient has poor outpatient follow-up,  worsening kidney function do not think patient is a good candidate for outpatient therapy.  Will contact with hospitalist for admission for pulmonary edema.  2050: Consulted with Dr. Hal Hope with [facility] who agrees to evaluate patient for admission.  Final Clinical Impressions(s) / ED Diagnoses   Final diagnoses:  Acute renal failure superimposed on stage 3 chronic kidney disease, unspecified acute renal failure type ([place])  Acute pulmonary edema (HCC)  Anemia due to stage 3 chronic kidney disease [facility])    ED Discharge Orders    None       Archie Shea A, PA-C 08/12/18 2105    Valarie Merino, MD 08/20/18 [phone]

## 2018-08-13 LAB — GLUCOSE, CAPILLARY
GLUCOSE-CAPILLARY: 95 mg/dL (ref 70–99)
Glucose-Capillary: 122 mg/dL — ABNORMAL HIGH (ref 70–99)

## 2018-08-13 LAB — CBC WITH DIFFERENTIAL/PLATELET
Abs Immature Granulocytes: 0.01 10*3/uL (ref 0.00–0.07)
Basophils Absolute: 0 10*3/uL (ref 0.0–0.1)
Basophils Relative: 1 %
Eosinophils Absolute: 0.5 10*3/uL (ref 0.0–0.5)
Eosinophils Relative: 12 %
HCT: 28.5 % — ABNORMAL LOW (ref 36.0–46.0)
Hemoglobin: 8.5 g/dL — ABNORMAL LOW (ref 12.0–15.0)
IMMATURE GRANULOCYTES: 0 %
Lymphocytes Relative: 27 %
Lymphs Abs: 1.1 10*3/uL (ref 0.7–4.0)
MCH: 27.3 pg (ref 26.0–34.0)
MCHC: 29.8 g/dL — ABNORMAL LOW (ref 30.0–36.0)
MCV: 91.6 fL (ref 80.0–100.0)
Monocytes Absolute: 0.3 10*3/uL (ref 0.1–1.0)
Monocytes Relative: 7 %
Neutro Abs: 2.1 10*3/uL (ref 1.7–7.7)
Neutrophils Relative %: 53 %
Platelets: 262 10*3/uL (ref 150–400)
RBC: 3.11 MIL/uL — ABNORMAL LOW (ref 3.87–5.11)
RDW: 15.8 % — ABNORMAL HIGH (ref 11.5–15.5)
WBC: 3.9 10*3/uL — ABNORMAL LOW (ref 4.0–10.5)
nRBC: 0 % (ref 0.0–0.2)

## 2018-08-13 LAB — COMPREHENSIVE METABOLIC PANEL
ALT: 28 U/L (ref 0–44)
AST: 26 U/L (ref 15–41)
Albumin: 2.3 g/dL — ABNORMAL LOW (ref 3.5–5.0)
Alkaline Phosphatase: 157 U/L — ABNORMAL HIGH (ref 38–126)
Anion gap: 10 (ref 5–15)
BUN: 44 mg/dL — ABNORMAL HIGH (ref 6–20)
CALCIUM: 8.1 mg/dL — AB (ref 8.9–10.3)
CO2: 18 mmol/L — ABNORMAL LOW (ref 22–32)
CREATININE: 4.36 mg/dL — AB (ref 0.44–1.00)
Chloride: 111 mmol/L (ref 98–111)
GFR calc Af Amer: 14 mL/min — ABNORMAL LOW (ref >60)
GFR calc non Af Amer: 12 mL/min — ABNORMAL LOW (ref >60)
Glucose, Bld: 104 mg/dL — ABNORMAL HIGH (ref 70–99)
Potassium: 4.7 mmol/L (ref 3.5–5.1)
Sodium: 139 mmol/L (ref 135–145)
Total Bilirubin: 0.4 mg/dL (ref 0.3–1.2)
Total Protein: 6.8 g/dL (ref 6.5–8.1)

## 2018-08-13 LAB — CBG MONITORING, ED
Glucose-Capillary: 74 mg/dL (ref 70–99)
Glucose-Capillary: 110 mg/dL — ABNORMAL HIGH (ref 70–99)

## 2018-08-13 MED ORDER — SODIUM BICARBONATE 650 MG PO TABS
650.0000 mg | ORAL_TABLET | Freq: Two times a day (BID) | ORAL | Status: DC
  Administered 2018-08-13 – 2018-08-14 (×2): 650 mg via ORAL
  Filled 2018-08-13 (×2): qty 1

## 2018-08-13 MED ORDER — DARBEPOETIN ALFA 100 MCG/0.5ML IJ SOSY
100.0000 ug | PREFILLED_SYRINGE | Freq: Once | INTRAMUSCULAR | Status: AC
  Administered 2018-08-13: 100 ug via SUBCUTANEOUS
  Filled 2018-08-13: qty 0.5

## 2018-08-13 MED ORDER — HYDRALAZINE HCL 20 MG/ML IJ SOLN: 5.0000 mg | INTRAMUSCULAR | Status: DC | PRN

## 2018-08-13 MED ORDER — CALCITRIOL 0.25 MCG PO CAPS
0.2500 ug | ORAL_CAPSULE | ORAL | Status: DC
  Administered 2018-08-14: 0.25 ug via ORAL
  Filled 2018-08-13: qty 1

## 2018-08-13 NOTE — Consult Note (Signed)
[facility] Nephrology Consultation Note  Requesting MD: Dr Tawanna Solo Reason for consult: CKD, fluid overload  HPI:  Carla Compton is a 38 y.o. 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 Moshe Cipro at [facility], presented with worsening shortness of breath associated with 5 pound weight gain in a week.  Patient is admitted for acute CHF exacerbation and now receiving IV Lasix.  The serum creatinine level was 4.3 on admission.  The prior creatinine level was 3.47 on 07/15/2018 at [facility], 4.24 in 05/2018.  Patient was admitted twice in December for CHF exacerbation when managed with IV diuretics.  She is on torsemide 80 mg every day at home.  She reports eating outside about a week ago when she started noticing lower extremity edema and dyspnea on exertion.  Denied fever, chills, chest pain, cough, nausea, vomiting, itching, dysgeusia, or change in the urinary habit.  Creatinine, Ser  Date/Time Value Ref Range Status  08/13/2018 02:56 AM 4.36 (H) 0.44 - 1.00 mg/dL Final  08/12/2018 06:58 PM 4.31 (H) 0.44 - 1.00 mg/dL Final  08/05/2018 12:31 PM 3.76 (H) 0.57 - 1.00 mg/dL Final  06/14/2018 05:57 AM 4.24 (H) 0.44 - 1.00 mg/dL Final  06/13/2018 04:38 AM 3.69 (H) 0.44 - 1.00 mg/dL Final  06/12/2018 05:53 AM 3.79 (H) 0.44 - 1.00 mg/dL Final  06/11/2018 03:39 AM 3.58 (H) 0.44 - 1.00 mg/dL Final  06/10/2018 05:02 AM 3.86 (H) 0.44 - 1.00 mg/dL Final  06/09/2018 05:14 AM 3.83 (H) 0.44 - 1.00 mg/dL Final  06/08/2018 04:23 AM 3.98 (H) 0.44 - 1.00 mg/dL Final  06/07/2018 04:19 AM 3.79 (H) 0.44 - 1.00 mg/dL Final  06/06/2018 06:24 AM 3.69 (H) 0.44 - 1.00 mg/dL Final  06/05/2018 11:35 PM 3.77 (H) 0.44 - 1.00 mg/dL Final  05/30/2018 05:21 AM 3.98 (H) 0.44 - 1.00 mg/dL  Final  05/29/2018 05:50 AM 3.88 (H) 0.44 - 1.00 mg/dL Final  05/28/2018 04:42 AM 3.27 (H) 0.44 - 1.00 mg/dL Final  05/27/2018 03:49 AM 3.29 (H) 0.44 - 1.00 mg/dL Final  05/26/2018 04:57 AM 3.28 (H) 0.44 - 1.00 mg/dL Final  05/25/2018 06:18 AM 3.18 (H) 0.44 - 1.00 mg/dL Final  05/24/2018 07:28 PM 3.05 (H) 0.44 - 1.00 mg/dL Final     PMHx:   Past Medical History:  Diagnosis Date  . Benign essential HTN   . CKD (chronic kidney disease) stage 3, GFR 30-59 ml/min (HCC)   . Diabetes ([place])     History reviewed. No pertinent surgical history.  Family Hx:  Family History  Problem Relation Age of Onset  . Mitral valve prolapse Mother   . Hypertension Mother   . Hypertension Father   . Diabetes Father   . Diabetes Maternal Grandmother     Social History:  reports that she has never smoked. She has never used smokeless tobacco. She reports that she does not drink alcohol or use drugs.  Allergies:  Allergies  Allergen Reactions  . Contrast Media [Iodinated Diagnostic Agents]     Medications: Prior to Admission medications   Medication Sig Start Date End Date Taking? Authorizing Provider  albuterol (PROAIR HFA) 108 (90 Base) MCG/ACT inhaler Inhale 2 puffs into the lungs every 4 (four) hours as needed for wheezing or shortness of breath.   Yes [provider]  allopurinol (ZYLOPRIM) 100  MG tablet Take 1 tablet (100 mg total) by mouth daily. 06/25/18  Yes Rayburn, Neta Mends, PA-C  aspirin EC 81 MG EC tablet Take 1 tablet (81 mg total) by mouth daily. 06/15/18  Yes Kayleen Memos, DO  carvedilol (COREG) 25 MG tablet Take 1 tablet (25 mg total) by mouth 2 (two) times daily with a meal. 06/14/18  Yes Irene Pap N, DO  hydrALAZINE (APRESOLINE) 50 MG tablet Take 1 tablet (50 mg total) by mouth 3 (three) times daily. 06/23/18  Yes Kroeger, Daleen Snook M., PA-C  rosuvastatin (CRESTOR) 40 MG tablet Take 40 mg by mouth at bedtime. 03/23/18  Yes [provider]  sodium  bicarbonate 650 MG tablet Take 1 tablet (650 mg total) by mouth daily. 06/14/18  Yes Kayleen Memos, DO  torsemide (DEMADEX) 20 MG tablet Take 4 tablets (80 mg total) by mouth daily. 06/15/18  Yes Kayleen Memos, DO  allopurinol (ZYLOPRIM) 100 MG tablet Take 1 tablet (100 mg total) by mouth 2 (two) times daily. Patient not taking: Reported on 08/12/2018 08/04/18   Newt Minion, MD    I have reviewed the patient's current medications.  Labs:  Results for orders placed or performed during the hospital encounter of 08/12/18 (from the past 48 hour(s))  CBC with Differential     Status: Abnormal   Collection Time: 08/12/18  6:58 PM  Result Value Ref Range   WBC 4.0 4.0 - 10.5 K/uL   RBC 3.15 (L) 3.87 - 5.11 MIL/uL   Hemoglobin 9.0 (L) 12.0 - 15.0 g/dL   HCT 28.6 (L) 36.0 - 46.0 %   MCV 90.8 80.0 - 100.0 fL   MCH 28.6 26.0 - 34.0 pg   MCHC 31.5 30.0 - 36.0 g/dL   RDW 15.9 (H) 11.5 - 15.5 %   Platelets 275 150 - 400 K/uL   nRBC 0.0 0.0 - 0.2 %   Neutrophils Relative % 51 %   Neutro Abs 2.0 1.7 - 7.7 K/uL   Lymphocytes Relative 29 %   Lymphs Abs 1.2 0.7 - 4.0 K/uL   Monocytes Relative 6 %   Monocytes Absolute 0.2 0.1 - 1.0 K/uL   Eosinophils Relative 13 %   Eosinophils Absolute 0.5 0.0 - 0.5 K/uL   Basophils Relative 1 %   Basophils Absolute 0.0 0.0 - 0.1 K/uL   Immature Granulocytes 0 %   Abs Immature Granulocytes 0.01 0.00 - 0.07 K/uL    Comment: Performed at [facility], [address], [place], [place] [address]  Comprehensive metabolic panel     Status: Abnormal   Collection Time: 08/12/18  6:58 PM  Result Value Ref Range   Sodium 138 135 - 145 mmol/L   Potassium 4.5 3.5 - 5.1 mmol/L   Chloride 113 (H) 98 - 111 mmol/L   CO2 18 (L) 22 - 32 mmol/L   Glucose, Bld 124 (H) 70 - 99 mg/dL   BUN 43 (H) 6 - 20 mg/dL   Creatinine, Ser 4.31 (H) 0.44 - 1.00 mg/dL   Calcium 8.0 (L) 8.9 - 10.3 mg/dL   Total Protein 7.2 6.5 - 8.1 g/dL   Albumin 2.5 (L) 3.5 - 5.0 g/dL   AST 26 15  - 41 U/L   ALT 29 0 - 44 U/L   Alkaline Phosphatase 168 (H) 38 - 126 U/L   Total Bilirubin 0.5 0.3 - 1.2 mg/dL   GFR calc non Af Amer 12 (L) >60 mL/min   GFR calc Af  Amer 14 (L) >60 mL/min   Anion gap 7 5 - 15    Comment: Performed at [facility], [place] [address], [place], [place] [address]  Brain natriuretic peptide     Status: Abnormal   Collection Time: 08/12/18  6:59 PM  Result Value Ref Range   B Natriuretic Peptide 436.1 (H) 0.0 - 100.0 pg/mL    Comment: Performed at [facility] [address] [place], [place] [address]  I-Stat Beta hCG blood, ED ([facility], [facility], [facility] only)     Status: None   Collection Time: 08/12/18  7:27 PM  Result Value Ref Range   I-stat hCG, quantitative <5.0 <5 mIU/mL   Comment 3            Comment:   GEST. AGE      CONC.  (mIU/mL)   <=1 WEEK        5 - 50     2 WEEKS       50 - 500     3 WEEKS       100 - 10,000     4 WEEKS     1,000 - 30,000        FEMALE AND NON-PREGNANT FEMALE:     LESS THAN 5 mIU/mL   Comprehensive metabolic panel     Status: Abnormal   Collection Time: 08/13/18  2:56 AM  Result Value Ref Range   Sodium 139 135 - 145 mmol/L   Potassium 4.7 3.5 - 5.1 mmol/L   Chloride 111 98 - 111 mmol/L   CO2 18 (L) 22 - 32 mmol/L   Glucose, Bld 104 (H) 70 - 99 mg/dL   BUN 44 (H) 6 - 20 mg/dL   Creatinine, Ser 4.36 (H) 0.44 - 1.00 mg/dL   Calcium 8.1 (L) 8.9 - 10.3 mg/dL   Total Protein 6.8 6.5 - 8.1 g/dL   Albumin 2.3 (L) 3.5 - 5.0 g/dL   AST 26 15 - 41 U/L   ALT 28 0 - 44 U/L   Alkaline Phosphatase 157 (H) 38 - 126 U/L   Total Bilirubin 0.4 0.3 - 1.2 mg/dL   GFR calc non Af Amer 12 (L) >60 mL/min   GFR calc Af Amer 14 (L) >60 mL/min   Anion gap 10 5 - 15    Comment: Performed at [facility], [place] [address], [place], [place] [address]  CBC WITH DIFFERENTIAL     Status: Abnormal   Collection Time: 08/13/18  2:56 AM  Result Value Ref Range   WBC 3.9 (L) 4.0 - 10.5 K/uL   RBC 3.11 (L) 3.87 - 5.11 MIL/uL   Hemoglobin 8.5  (L) 12.0 - 15.0 g/dL   HCT 28.5 (L) 36.0 - 46.0 %   MCV 91.6 80.0 - 100.0 fL   MCH 27.3 26.0 - 34.0 pg   MCHC 29.8 (L) 30.0 - 36.0 g/dL   RDW 15.8 (H) 11.5 - 15.5 %   Platelets 262 150 - 400 K/uL   nRBC 0.0 0.0 - 0.2 %   Neutrophils Relative % 53 %   Neutro Abs 2.1 1.7 - 7.7 K/uL   Lymphocytes Relative 27 %   Lymphs Abs 1.1 0.7 - 4.0 K/uL   Monocytes Relative 7 %   Monocytes Absolute 0.3 0.1 - 1.0 K/uL   Eosinophils Relative 12 %   Eosinophils Absolute 0.5 0.0 - 0.5 K/uL   Basophils Relative 1 %   Basophils Absolute 0.0 0.0 -  0.1 K/uL   Immature Granulocytes 0 %   Abs Immature Granulocytes 0.01 0.00 - 0.07 K/uL    Comment: Performed at [facility], [place] [address] [place], [place] [address]  CBG monitoring, ED     Status: None   Collection Time: 08/13/18  9:12 AM  Result Value Ref Range   Glucose-Capillary 74 70 - 99 mg/dL  CBG monitoring, ED     Status: Abnormal   Collection Time: 08/13/18 11:41 AM  Result Value Ref Range   Glucose-Capillary 110 (H) 70 - 99 mg/dL     ROS:  Pertinent items noted in HPI and remainder of comprehensive ROS otherwise negative.  Physical Exam: Vitals:   08/13/18 1400 08/13/18 1532  BP: (!) 126/92 (!) 151/91  Pulse: 82   Resp: 16   Temp: (!) 97.5 F (36.4 C)   SpO2: 98%      General exam: Appears calm and comfortable  Respiratory system: Distant breath sound, no wheezing or crackles appreciated. Cardiovascular system: S1 & S2 heard, RRR.  Bilateral lower extremities pitting edema Gastrointestinal system: Abdomen is soft and nontender. Normal bowel sounds heard. Central nervous system: Alert and oriented. No focal neurological deficits. Extremities: Symmetric 5 x 5 power. Skin: No rashes, lesions or ulcers Psychiatry: Judgement and insight appear normal. Mood & affect appropriate.   Assessment/Plan:  #CKD stage IV/V: Likely progressive CKD due to biopsy-proven diabetic nephropathy.  Patient is now admitted with fluid overload  requiring IV diuretics.  Agree with Lasix 80 mg IV twice a day.  Recommend strict ins and out, monitor BMP and electrolytes.  Patient will likely need torsemide 80 in the morning and 40 in the afternoon on discharge.  I discussed with her progressive CKD and preparation for dialysis if GFR remains less than 15%. -Avoid NSAIDs, IV contrast or nephrotoxins. -Change sodium bicarbonate to twice a day.  #Acute CHF exacerbation: This is her third recent hospitalization for CHF exacerbation.  Echo with EF of 45 to 50% with grade 2 diastolic dysfunction.  Diuretics as above.  #Anemia of chronic kidney disease: Iron saturation 29, serum iron 43 and ferritin 234 on 07/15/2018 at [facility].  I will order a dose of Aranesp.  #Secondary hyperparathyroidism: The PTH level was 126.  I will check phosphorus level.  Continue calcitriol 0.25 three times a week.  #Hypertension: Continue Coreg, hydralazine and diuretics.  Recommend to restrict salt and fluid intake.  Thank you for the consult. We'll continue to follow with you.  Dron Tanna Furry 08/13/2018, 4:23 PM  [facility].

## 2018-08-13 NOTE — Progress Notes (Signed)
Pt transferred to 4E-02 via wheelchair from ED. Pt walked to the bed. CHG bath given. Tele applied, CCMD notified. VSS. Pt oriented to call bell, bed, and room. Call bell within reach. Will continue to monitor. Amanda Cockayne, RN

## 2018-08-13 NOTE — Progress Notes (Signed)
PROGRESS NOTE    Carla Compton  GDJ:[id number] DOB: 10-Apr-1981 DOA: 08/12/2018 PCP: Abigail Miyamoto, FNP   Brief Narrative: Patient is a 38 year old female with past medical history of chronic combined CHF, CKD stage IV following with [facility], chronic anemia, diabetes type 2, uncontrolled hypertension, gout who presents the emergency department complaints of increasing shortness of breath.  Reported of increasing shortness of breath since last 3 to 4 days, orthopnea and exertional dyspnea.  Patient admitted for the management of CHF exacerbation and AKI on CKD.  Nephrology consulted today.  Assessment & Plan:   Principal Problem:   Acute pulmonary edema (HCC) Active Problems:   Uncontrolled secondary diabetes mellitus with stage 4 CKD (GFR 15-29) (HCC)   CKD (chronic kidney disease) stage 4, GFR 15-29 ml/min (HCC)   Acute on chronic combined systolic and diastolic CHF (congestive heart failure) (HCC)   Acute respiratory failure with hypoxia: From pulmonary edema.  Currently respiratory status is stabilized.  Currently she is functioning fine on room air.  Continue to monitor respiratory status  Acute on chronic combined CHF: BNP of 436.  Chest x-ray on presentation showed pulmonary edema.  She states she was compliant with her torsemide at home.  Continue IV Lasix 80 mg twice a day at present.  Continue to monitor input and output.  Fluid restriction to less than 1 L a day. Follows with cardiology as an outpatient.  Last echocardiogram done in December 2019 showed ejection fraction of 45 to 62%, grade 2 diastolic dysfunction. She has moderate bilateral lower extremity edema.  Negative JVD.  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  AKI due to cardiorenal syndrome. Baseline creatinine around 3.7. Nephrology consulted.  Continue to monitor renal function.  Avoid nephrotoxins.  Diabetes mellitus type 2: On insulin at home.   Continue sliding's insulin here.  Her last hemoglobin A1c in 05/2018 was 7.1.  Hypertension: Likely uncontrolled.  On Coreg and hydralazine at home.  History of gout: On allopurinol.  There was suspicion for right foot osteomyelitis on last admission but she follows with [name] and she has been cleared from that.  No active ulcers or wounds  Hyperlipidemia: Continue statin  Chronic normocytic anemia: Secondary to chronic kidney disease.  Continue to monitor.  Morbid obesity: BMI of 46.6        DVT prophylaxis:Heparin [place] Code Status: Full Family Communication: None present at the bedside Disposition Plan: Home after stabilization of CHF and acute kidney injury   Consultants: Nephrology  Procedures: None  Antimicrobials:  Anti-infectives (From admission, onward)   None      Subjective: Patient seen and examined the bedside this morning in the emergency department.  Currently hemodynamically stable.  Respiratory status stable now.  Saturating fine on room air.  Denies any shortness of breath at rest.  Objective: Vitals:   08/13/18 0730 08/13/18 0800 08/13/18 0830 08/13/18 1015  BP: (!) 158/92 (!) 146/93 140/89   Pulse: 85 81 81 85  Resp: 18 15 13 17   Temp:      TempSrc:      SpO2: 93% 95% 95% 97%  Weight:      Height:       No intake or output data in the 24 hours ending 08/13/18 1124 Filed Weights   08/12/18 1730  Weight: 115.7 kg    Examination:  General exam: Appears calm and comfortable ,Not in distress, morbidly obese  HEENT:PERRL,Oral mucosa moist, Ear/Nose normal  on gross exam Respiratory system: Bilateral decreased air entry on the bases, bilateral basilar crackles Cardiovascular system: S1 & S2 heard, RRR. No JVD, murmurs, rubs, gallops or clicks.  2-3+ pedal edema. Gastrointestinal system: Abdomen is nondistended, soft and nontender. No organomegaly or masses felt. Normal bowel sounds heard. Central nervous system: Alert and oriented. No focal  neurological deficits. Extremities: 2-3+ pedal edema, no clubbing ,no cyanosis, distal peripheral pulses palpable. Skin: No rashes, lesions or ulcers,no icterus ,no pallor MSK: Normal muscle bulk,tone ,power Psychiatry: Judgement and insight appear normal. Mood & affect appropriate.     Data Reviewed: I have personally reviewed following labs and imaging studies  CBC: Recent Labs  Lab 08/12/18 1858 08/13/18 0256  WBC 4.0 3.9*  NEUTROABS 2.0 2.1  HGB 9.0* 8.5*  HCT 28.6* 28.5*  MCV 90.8 91.6  PLT 275 976   Basic Metabolic Panel: Recent Labs  Lab 08/12/18 1858 08/13/18 0256  NA 138 139  K 4.5 4.7  CL 113* 111  CO2 18* 18*  GLUCOSE 124* 104*  BUN 43* 44*  CREATININE 4.31* 4.36*  CALCIUM 8.0* 8.1*   GFR: Estimated Creatinine Clearance: 21.7 mL/min (A) (by C-G formula based on SCr of 4.36 mg/dL (H)). Liver Function Tests: Recent Labs  Lab 08/12/18 1858 08/13/18 0256  AST 26 26  ALT 29 28  ALKPHOS 168* 157*  BILITOT 0.5 0.4  PROT 7.2 6.8  ALBUMIN 2.5* 2.3*   No results for input(s): LIPASE, AMYLASE in the last 168 hours. No results for input(s): AMMONIA in the last 168 hours. Coagulation Profile: No results for input(s): INR, PROTIME in the last 168 hours. Cardiac Enzymes: No results for input(s): CKTOTAL, CKMB, CKMBINDEX, TROPONINI in the last 168 hours. BNP (last 3 results) No results for input(s): PROBNP in the last 8760 hours. HbA1C: No results for input(s): HGBA1C in the last 72 hours. CBG: Recent Labs  Lab 08/13/18 0912  GLUCAP 74   Lipid Profile: No results for input(s): CHOL, HDL, LDLCALC, TRIG, CHOLHDL, LDLDIRECT in the last 72 hours. Thyroid Function Tests: No results for input(s): TSH, T4TOTAL, FREET4, T3FREE, THYROIDAB in the last 72 hours. Anemia Panel: No results for input(s): VITAMINB12, FOLATE, FERRITIN, TIBC, IRON, RETICCTPCT in the last 72 hours. Sepsis Labs: No results for input(s): PROCALCITON, LATICACIDVEN in the last 168  hours.  No results found for this or any previous visit (from the past 240 hour(s)).       Radiology Studies: Dg Chest 2 View  Result Date: 08/12/2018 CLINICAL DATA:  Shortness of breath with exertion, orthopnea. History of diabetes, hypertension and CHF. EXAM: CHEST - 2 VIEW COMPARISON:  Chest radiograph June 06, 2018 FINDINGS: Cardiac silhouette is upper limits of normal in size. Mediastinal silhouette is not suspicious. Interstitial prominence without pleural effusion or focal consolidation. No pneumothorax. Large body habitus. Osseous structures are nonacute. IMPRESSION: 1. Increased interstitial prominence seen with pulmonary edema or atypical infection. 2. Borderline cardiomegaly. Electronically Signed   By: Elon Alas M.D.   On: 08/12/2018 18:04        Scheduled Meds: . allopurinol  100 mg Oral Daily  . aspirin EC  81 mg Oral Daily  . carvedilol  25 mg Oral BID WC  . furosemide  80 mg Intravenous Q12H  . heparin  5,000 Units Subcutaneous Q8H  . hydrALAZINE  50 mg Oral TID  . insulin aspart  0-9 Units Subcutaneous TID WC  . rosuvastatin  40 mg Oral QHS  . sodium bicarbonate  650 mg  Oral Daily   Continuous Infusions:   LOS: 1 day    Time spent: 35 mins.More than 50% of that time was spent in counseling and/or coordination of care.      Shelly Coss, MD [facility] Pager [phone]  If 7PM-7AM, please contact night-coverage [email or web address] Password [facility] 08/13/2018, 11:24 AM

## 2018-08-13 NOTE — ED Notes (Signed)
Breakfast Tray ordered  

## 2018-08-14 ENCOUNTER — Encounter (HOSPITAL_COMMUNITY): Payer: Self-pay | Admitting: General Practice

## 2018-08-14 LAB — CBC WITH DIFFERENTIAL/PLATELET
Abs Immature Granulocytes: 0.02 10*3/uL (ref 0.00–0.07)
BASOS ABS: 0 10*3/uL (ref 0.0–0.1)
Basophils Relative: 1 %
Eosinophils Absolute: 0.4 10*3/uL (ref 0.0–0.5)
Eosinophils Relative: 12 %
HCT: 27.3 % — ABNORMAL LOW (ref 36.0–46.0)
Hemoglobin: 8.5 g/dL — ABNORMAL LOW (ref 12.0–15.0)
Immature Granulocytes: 1 %
Lymphocytes Relative: 30 %
Lymphs Abs: 1.1 10*3/uL (ref 0.7–4.0)
MCH: 27.7 pg (ref 26.0–34.0)
MCHC: 31.1 g/dL (ref 30.0–36.0)
MCV: 88.9 fL (ref 80.0–100.0)
Monocytes Absolute: 0.3 10*3/uL (ref 0.1–1.0)
Monocytes Relative: 8 %
Neutro Abs: 1.7 10*3/uL (ref 1.7–7.7)
Neutrophils Relative %: 48 %
Platelets: 267 10*3/uL (ref 150–400)
RBC: 3.07 MIL/uL — AB (ref 3.87–5.11)
RDW: 15.8 % — AB (ref 11.5–15.5)
WBC: 3.6 10*3/uL — ABNORMAL LOW (ref 4.0–10.5)
nRBC: 0 % (ref 0.0–0.2)

## 2018-08-14 LAB — GLUCOSE, CAPILLARY
Glucose-Capillary: 100 mg/dL — ABNORMAL HIGH (ref 70–99)
Glucose-Capillary: 99 mg/dL (ref 70–99)
Glucose-Capillary: 108 mg/dL — ABNORMAL HIGH (ref 70–99)
Glucose-Capillary: 138 mg/dL — ABNORMAL HIGH (ref 70–99)

## 2018-08-14 LAB — IRON AND TIBC
Iron: 65 ug/dL (ref 28–170)
Saturation Ratios: 35 % — ABNORMAL HIGH (ref 10.4–31.8)
TIBC: 186 ug/dL — ABNORMAL LOW (ref 250–450)
UIBC: 121 ug/dL

## 2018-08-14 LAB — RENAL FUNCTION PANEL
Albumin: 2.3 g/dL — ABNORMAL LOW (ref 3.5–5.0)
Anion gap: 9 (ref 5–15)
BUN: 44 mg/dL — ABNORMAL HIGH (ref 6–20)
CALCIUM: 7.8 mg/dL — AB (ref 8.9–10.3)
CO2: 19 mmol/L — ABNORMAL LOW (ref 22–32)
Chloride: 110 mmol/L (ref 98–111)
Creatinine, Ser: 4.4 mg/dL — ABNORMAL HIGH (ref 0.44–1.00)
GFR calc Af Amer: 14 mL/min — ABNORMAL LOW (ref >60)
GFR calc non Af Amer: 12 mL/min — ABNORMAL LOW (ref >60)
Glucose, Bld: 91 mg/dL (ref 70–99)
Phosphorus: 6.2 mg/dL — ABNORMAL HIGH (ref 2.5–4.6)
Potassium: 4.5 mmol/L (ref 3.5–5.1)
Sodium: 138 mmol/L (ref 135–145)

## 2018-08-14 LAB — FERRITIN: Ferritin: 198 ng/mL (ref 11–307)

## 2018-08-14 MED ORDER — DOXYCYCLINE HYCLATE 100 MG PO TABS
100.0000 mg | ORAL_TABLET | Freq: Two times a day (BID) | ORAL | Status: DC
  Administered 2018-08-14 – 2018-08-16 (×4): 100 mg via ORAL
  Filled 2018-08-14 (×4): qty 1

## 2018-08-14 MED ORDER — FUROSEMIDE 10 MG/ML IJ SOLN
120.0000 mg | Freq: Three times a day (TID) | INTRAVENOUS | Status: DC
  Administered 2018-08-14 – 2018-08-15 (×3): 120 mg via INTRAVENOUS
  Filled 2018-08-14: qty 10
  Filled 2018-08-14: qty 12
  Filled 2018-08-14: qty 2
  Filled 2018-08-14: qty 10

## 2018-08-14 NOTE — Progress Notes (Signed)
PROGRESS NOTE    Carla Compton  BWI:[id number] DOB: 11-03-1980 DOA: 08/12/2018 PCP: Abigail Miyamoto, FNP   Brief Narrative: Patient is a 37 year old female with past medical history of chronic combined CHF, CKD stage IV following with [facility]  [facility], chronic anemia, diabetes type 2, uncontrolled hypertension, gout who presents the emergency department complaints of increasing shortness of breath.  Reported of increasing shortness of breath since last 3 to 4 days, orthopnea and exertional dyspnea.  Patient admitted for the management of CHF exacerbation and AKI on CKD.  Nephrology consulted today.  08/14/2018: Patient seen.  Patient is slowly improving.  We will continue current management for now.  Nephrology input is highly appreciated.  Patient is currently on IV Lasix 120 mg 8 hours.  Nephrology is directing diuresis.  Assessment & Plan:   Principal Problem:   Acute pulmonary edema (HCC) Active Problems:   Uncontrolled secondary diabetes mellitus with stage 4 CKD (GFR 15-29) (HCC)   CKD (chronic kidney disease) stage 4, GFR 15-29 ml/min (HCC)   Acute on chronic combined systolic and diastolic CHF (congestive heart failure) (HCC)   Acute respiratory failure with hypoxia:  Likely from pulmonary edema.   Currently respiratory status is stabilized.   Currently she is functioning fine on room air.   Continue to monitor respiratory status  Acute on chronic combined CHF:  BNP of 436.  Chest x-ray on presentation showed pulmonary edema.  She states she was compliant with her torsemide at home.  Continue IV Lasix 80 mg twice a day at present.  Continue to monitor input and output.  Fluid restriction to less than 1 L a day. Follows with cardiology as an outpatient.  Last echocardiogram done in December 2019 showed ejection fraction of 45 to 63%, grade 2 diastolic dysfunction. She has moderate bilateral lower extremity edema.  Negative JVD. 08/14/2018: Patient seen.  Patient is slowly  improving.  We will continue current management for now.  Nephrology input is highly appreciated.  Patient is currently on IV Lasix 120 mg 8 hours.  Nephrology is directing diuresis.   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  AKI due to cardiorenal syndrome. Baseline creatinine around 3.7. Nephrology consulted.  Continue to monitor renal function.  Avoid nephrotoxins. 08/14/2018: Nephrology team is directing care.  Diabetes mellitus type 2:  On insulin at home.  Continue sliding's insulin here.  Her last hemoglobin A1c in 05/2018 was 7.1. 08/14/2018: Continue to optimize.  Hypertension:  Likely uncontrolled.   On Coreg and hydralazine at home. 08/14/2018: The blood pressure is better controlled.  Last blood pressure was 134/82 mmHg.  History of gout:  On allopurinol.  There was suspicion for right foot osteomyelitis on last admission but she follows with [name] and she has been cleared from that.  No active ulcers or wounds  Hyperlipidemia:  Continue statin  Chronic normocytic anemia:  Secondary to chronic kidney disease.  Continue to monitor.  Morbid obesity: BMI of 46.6  DVT prophylaxis:Heparin [place] Code Status: Full Family Communication: None present at the bedside Disposition Plan: Home after stabilization of CHF and acute kidney injury  Consultants: Nephrology  Procedures: None  Antimicrobials:  Anti-infectives (From admission, onward)   None      Subjective: No new complaints No shortness of breath. No chest pain.  Objective: Vitals:   08/14/18 0350 08/14/18 0352 08/14/18 1000 08/14/18 1255  BP: (!) 156/90  (!) 145/93 134/82  Pulse: 84  81  81  Resp: 16  14 18   Temp: 97.8 F (36.6 C)  97.9 F (36.6 C) (!) 97.5 F (36.4 C)  TempSrc: Oral  Oral Oral  SpO2: 98%  97% 97%  Weight:  115.5 kg    Height:        Intake/Output Summary (Last 24 hours) at 08/14/2018 1409 Last data filed at 08/14/2018  1254 Gross per 24 hour  Intake 600 ml  Output 1100 ml  Net -500 ml   Filed Weights   08/12/18 1730 08/14/18 0352  Weight: 115.7 kg 115.5 kg    Examination:  General exam: Appears calm and comfortable ,Not in distress, morbidly obese  HEENT: Pallor.  No jaundice. Respiratory system: Decreased air entry globally. Cardiovascular system: S1 & S2 heard Gastrointestinal system: Abdomen is obese, soft and nontender.  Organs are not palpable.  Central nervous system: Alert and oriented.  Patient moves all limbs.   Extremities: Edema of the extremities.  Data Reviewed: I have personally reviewed following labs and imaging studies  CBC: Recent Labs  Lab 08/12/18 1858 08/13/18 0256 08/14/18 0316  WBC 4.0 3.9* 3.6*  NEUTROABS 2.0 2.1 1.7  HGB 9.0* 8.5* 8.5*  HCT 28.6* 28.5* 27.3*  MCV 90.8 91.6 88.9  PLT 275 262 161   Basic Metabolic Panel: Recent Labs  Lab 08/12/18 1858 08/13/18 0256 08/14/18 0316  NA 138 139 138  K 4.5 4.7 4.5  CL 113* 111 110  CO2 18* 18* 19*  GLUCOSE 124* 104* 91  BUN 43* 44* 44*  CREATININE 4.31* 4.36* 4.40*  CALCIUM 8.0* 8.1* 7.8*  PHOS  --   --  6.2*   GFR: Estimated Creatinine Clearance: 21.5 mL/min (A) (by C-G formula based on SCr of 4.4 mg/dL (H)). Liver Function Tests: Recent Labs  Lab 08/12/18 1858 08/13/18 0256 08/14/18 0316  AST 26 26  --   ALT 29 28  --   ALKPHOS 168* 157*  --   BILITOT 0.5 0.4  --   PROT 7.2 6.8  --   ALBUMIN 2.5* 2.3* 2.3*   No results for input(s): LIPASE, AMYLASE in the last 168 hours. No results for input(s): AMMONIA in the last 168 hours. Coagulation Profile: No results for input(s): INR, PROTIME in the last 168 hours. Cardiac Enzymes: No results for input(s): CKTOTAL, CKMB, CKMBINDEX, TROPONINI in the last 168 hours. BNP (last 3 results) No results for input(s): PROBNP in the last 8760 hours. HbA1C: No results for input(s): HGBA1C in the last 72 hours. CBG: Recent Labs  Lab 08/13/18 1141  08/13/18 1624 08/13/18 2105 08/14/18 0555 08/14/18 1123  GLUCAP 110* 122* 95 100* 99   Lipid Profile: No results for input(s): CHOL, HDL, LDLCALC, TRIG, CHOLHDL, LDLDIRECT in the last 72 hours. Thyroid Function Tests: No results for input(s): TSH, T4TOTAL, FREET4, T3FREE, THYROIDAB in the last 72 hours. Anemia Panel: Recent Labs    08/14/18 1049  FERRITIN 198  TIBC 186*  IRON 65   Sepsis Labs: No results for input(s): PROCALCITON, LATICACIDVEN in the last 168 hours.  No results found for this or any previous visit (from the past 240 hour(s)).       Radiology Studies: Dg Chest 2 View  Result Date: 08/12/2018 CLINICAL DATA:  Shortness of breath with exertion, orthopnea. History of diabetes, hypertension and CHF. EXAM: CHEST - 2 VIEW COMPARISON:  Chest radiograph June 06, 2018 FINDINGS: Cardiac silhouette is upper limits of normal in size. Mediastinal silhouette is not suspicious. Interstitial prominence without pleural effusion  or focal consolidation. No pneumothorax. Large body habitus. Osseous structures are nonacute. IMPRESSION: 1. Increased interstitial prominence seen with pulmonary edema or atypical infection. 2. Borderline cardiomegaly. Electronically Signed   By: Elon Alas M.D.   On: 08/12/2018 18:04        Scheduled Meds: . allopurinol  100 mg Oral Daily  . aspirin EC  81 mg Oral Daily  . calcitRIOL  0.25 mcg Oral Q M,W,F  . carvedilol  25 mg Oral BID WC  . heparin  5,000 Units Subcutaneous Q8H  . hydrALAZINE  50 mg Oral TID  . insulin aspart  0-9 Units Subcutaneous TID WC  . rosuvastatin  40 mg Oral QHS   Continuous Infusions: . furosemide       LOS: 2 days    Time spent: 35 mins.  Bonnell Public, MD [facility] Pager [phone]  If 7PM-7AM, please contact night-coverage [email or web address] Password [email or web address] 08/14/2018, 2:09 PM

## 2018-08-14 NOTE — Consult Note (Addendum)
Reason for Consult:Left index finger abscess Referring Physician: [name] is an 38 y.o. female.  HPI: Carla Compton was admitted 2/2 with a CHF exacerbation. She notes that about a week ago she developed a small bump on the pad of her left index finger and when she was putting on an earring it punctured it. It had slowly been getting worse and about 3d ago developed what appears to be an abscess. She is LHD.  Past Medical History:  Diagnosis Date  . Anemia    2019  . Benign essential HTN   . CHF (congestive heart failure) ([place])   . CKD (chronic kidney disease) stage 3, GFR 30-59 ml/min (HCC)   . Diabetes [facility])     Past Surgical History:  Procedure Laterality Date  . BREAST SURGERY     reduction, 2005  . RENAL BIOPSY  2018    Family History  Problem Relation Age of Onset  . Mitral valve prolapse Mother   . Hypertension Mother   . Hypertension Father   . Diabetes Father   . Diabetes Maternal Grandmother     Social History:  reports that she has never smoked. She has never used smokeless tobacco. She reports that she does not drink alcohol or use drugs.  Allergies:  Allergies  Allergen Reactions  . Contrast Media [Iodinated Diagnostic Agents]     Medications: I have reviewed the patient'Carla current medications.  Results for orders placed or performed during the hospital encounter of 08/12/18 (from the past 48 hour(Carla))  CBC with Differential     Status: Abnormal   Collection Time: 08/12/18  6:58 PM  Result Value Ref Range   WBC 4.0 4.0 - 10.5 K/uL   RBC 3.15 (L) 3.87 - 5.11 MIL/uL   Hemoglobin 9.0 (L) 12.0 - 15.0 g/dL   HCT 28.6 (L) 36.0 - 46.0 %   MCV 90.8 80.0 - 100.0 fL   MCH 28.6 26.0 - 34.0 pg   MCHC 31.5 30.0 - 36.0 g/dL   RDW 15.9 (H) 11.5 - 15.5 %   Platelets 275 150 - 400 K/uL   nRBC 0.0 0.0 - 0.2 %   Neutrophils Relative % 51 %   Neutro Abs 2.0 1.7 - 7.7 K/uL   Lymphocytes Relative 29 %   Lymphs Abs 1.2 0.7 - 4.0 K/uL   Monocytes Relative  6 %   Monocytes Absolute 0.2 0.1 - 1.0 K/uL   Eosinophils Relative 13 %   Eosinophils Absolute 0.5 0.0 - 0.5 K/uL   Basophils Relative 1 %   Basophils Absolute 0.0 0.0 - 0.1 K/uL   Immature Granulocytes 0 %   Abs Immature Granulocytes 0.01 0.00 - 0.07 K/uL    Comment: Performed at [facility], [address], [place], [place] [address]  Comprehensive metabolic panel     Status: Abnormal   Collection Time: 08/12/18  6:58 PM  Result Value Ref Range   Sodium 138 135 - 145 mmol/L   Potassium 4.5 3.5 - 5.1 mmol/L   Chloride 113 (H) 98 - 111 mmol/L   CO2 18 (L) 22 - 32 mmol/L   Glucose, Bld 124 (H) 70 - 99 mg/dL   BUN 43 (H) 6 - 20 mg/dL   Creatinine, Ser 4.31 (H) 0.44 - 1.00 mg/dL   Calcium 8.0 (L) 8.9 - 10.3 mg/dL   Total Protein 7.2 6.5 - 8.1 g/dL   Albumin 2.5 (L) 3.5 - 5.0 g/dL   AST 26 15 - 41  U/L   ALT 29 0 - 44 U/L   Alkaline Phosphatase 168 (H) 38 - 126 U/L   Total Bilirubin 0.5 0.3 - 1.2 mg/dL   GFR calc non Af Amer 12 (L) >60 mL/min   GFR calc Af Amer 14 (L) >60 mL/min   Anion gap 7 5 - 15    Comment: Performed at [facility] [address], [place], [place] [address]  Brain natriuretic peptide     Status: Abnormal   Collection Time: 08/12/18  6:59 PM  Result Value Ref Range   B Natriuretic Peptide 436.1 (H) 0.0 - 100.0 pg/mL    Comment: Performed at [facility] [address] [place], [place] [address]  I-Stat Beta hCG blood, ED ([facility], [facility], [facility] only)     Status: None   Collection Time: 08/12/18  7:27 PM  Result Value Ref Range   I-stat hCG, quantitative <5.0 <5 mIU/mL   Comment 3            Comment:   GEST. AGE      CONC.  (mIU/mL)   <=1 WEEK        5 - 50     2 WEEKS       50 - 500     3 WEEKS       100 - 10,000     4 WEEKS     1,000 - 30,000        FEMALE AND NON-PREGNANT FEMALE:     LESS THAN 5 mIU/mL   Comprehensive metabolic panel     Status: Abnormal   Collection Time: 08/13/18  2:56 AM  Result Value Ref Range   Sodium 139 135 - 145  mmol/L   Potassium 4.7 3.5 - 5.1 mmol/L   Chloride 111 98 - 111 mmol/L   CO2 18 (L) 22 - 32 mmol/L   Glucose, Bld 104 (H) 70 - 99 mg/dL   BUN 44 (H) 6 - 20 mg/dL   Creatinine, Ser 4.36 (H) 0.44 - 1.00 mg/dL   Calcium 8.1 (L) 8.9 - 10.3 mg/dL   Total Protein 6.8 6.5 - 8.1 g/dL   Albumin 2.3 (L) 3.5 - 5.0 g/dL   AST 26 15 - 41 U/L   ALT 28 0 - 44 U/L   Alkaline Phosphatase 157 (H) 38 - 126 U/L   Total Bilirubin 0.4 0.3 - 1.2 mg/dL   GFR calc non Af Amer 12 (L) >60 mL/min   GFR calc Af Amer 14 (L) >60 mL/min   Anion gap 10 5 - 15    Comment: Performed at [facility], [place] [address] [place], [place] [address]  CBC WITH DIFFERENTIAL     Status: Abnormal   Collection Time: 08/13/18  2:56 AM  Result Value Ref Range   WBC 3.9 (L) 4.0 - 10.5 K/uL   RBC 3.11 (L) 3.87 - 5.11 MIL/uL   Hemoglobin 8.5 (L) 12.0 - 15.0 g/dL   HCT 28.5 (L) 36.0 - 46.0 %   MCV 91.6 80.0 - 100.0 fL   MCH 27.3 26.0 - 34.0 pg   MCHC 29.8 (L) 30.0 - 36.0 g/dL   RDW 15.8 (H) 11.5 - 15.5 %   Platelets 262 150 - 400 K/uL   nRBC 0.0 0.0 - 0.2 %   Neutrophils Relative % 53 %   Neutro Abs 2.1 1.7 - 7.7 K/uL   Lymphocytes Relative 27 %   Lymphs Abs 1.1 0.7 - 4.0  K/uL   Monocytes Relative 7 %   Monocytes Absolute 0.3 0.1 - 1.0 K/uL   Eosinophils Relative 12 %   Eosinophils Absolute 0.5 0.0 - 0.5 K/uL   Basophils Relative 1 %   Basophils Absolute 0.0 0.0 - 0.1 K/uL   Immature Granulocytes 0 %   Abs Immature Granulocytes 0.01 0.00 - 0.07 K/uL    Comment: Performed at [facility] [address] [place], [place] [address]  CBG monitoring, ED     Status: None   Collection Time: 08/13/18  9:12 AM  Result Value Ref Range   Glucose-Capillary 74 70 - 99 mg/dL  CBG monitoring, ED     Status: Abnormal   Collection Time: 08/13/18 11:41 AM  Result Value Ref Range   Glucose-Capillary 110 (H) 70 - 99 mg/dL  Glucose, capillary     Status: Abnormal   Collection Time: 08/13/18  4:24 PM  Result Value Ref Range     Glucose-Capillary 122 (H) 70 - 99 mg/dL   Comment 1 Notify RN    Comment 2 Document in Chart   Glucose, capillary     Status: None   Collection Time: 08/13/18  9:05 PM  Result Value Ref Range   Glucose-Capillary 95 70 - 99 mg/dL  CBC with Differential/Platelet     Status: Abnormal   Collection Time: 08/14/18  3:16 AM  Result Value Ref Range   WBC 3.6 (L) 4.0 - 10.5 K/uL   RBC 3.07 (L) 3.87 - 5.11 MIL/uL   Hemoglobin 8.5 (L) 12.0 - 15.0 g/dL   HCT 27.3 (L) 36.0 - 46.0 %   MCV 88.9 80.0 - 100.0 fL   MCH 27.7 26.0 - 34.0 pg   MCHC 31.1 30.0 - 36.0 g/dL   RDW 15.8 (H) 11.5 - 15.5 %   Platelets 267 150 - 400 K/uL   nRBC 0.0 0.0 - 0.2 %   Neutrophils Relative % 48 %   Neutro Abs 1.7 1.7 - 7.7 K/uL   Lymphocytes Relative 30 %   Lymphs Abs 1.1 0.7 - 4.0 K/uL   Monocytes Relative 8 %   Monocytes Absolute 0.3 0.1 - 1.0 K/uL   Eosinophils Relative 12 %   Eosinophils Absolute 0.4 0.0 - 0.5 K/uL   Basophils Relative 1 %   Basophils Absolute 0.0 0.0 - 0.1 K/uL   Immature Granulocytes 1 %   Abs Immature Granulocytes 0.02 0.00 - 0.07 K/uL    Comment: Performed at [facility], [address] [place], [place] [address]  Renal function panel     Status: Abnormal   Collection Time: 08/14/18  3:16 AM  Result Value Ref Range   Sodium 138 135 - 145 mmol/L   Potassium 4.5 3.5 - 5.1 mmol/L   Chloride 110 98 - 111 mmol/L   CO2 19 (L) 22 - 32 mmol/L   Glucose, Bld 91 70 - 99 mg/dL   BUN 44 (H) 6 - 20 mg/dL   Creatinine, Ser 4.40 (H) 0.44 - 1.00 mg/dL   Calcium 7.8 (L) 8.9 - 10.3 mg/dL   Phosphorus 6.2 (H) 2.5 - 4.6 mg/dL   Albumin 2.3 (L) 3.5 - 5.0 g/dL   GFR calc non Af Amer 12 (L) >60 mL/min   GFR calc Af Amer 14 (L) >60 mL/min   Anion gap 9 5 - 15    Comment: Performed at [facility] [address], [place], [place] [address]  Glucose, capillary  Status: Abnormal   Collection Time: 08/14/18  5:55 AM  Result Value Ref Range   Glucose-Capillary 100 (H) 70 - 99 mg/dL   Ferritin     Status: None   Collection Time: 08/14/18 10:49 AM  Result Value Ref Range   Ferritin 198 11 - 307 ng/mL    Comment: Performed at [facility], [place] [address], [place], [place] [address]  Iron and TIBC     Status: Abnormal   Collection Time: 08/14/18 10:49 AM  Result Value Ref Range   Iron 65 28 - 170 ug/dL   TIBC 186 (L) 250 - 450 ug/dL   Saturation Ratios 35 (H) 10.4 - 31.8 %   UIBC 121 ug/dL    Comment: Performed at [facility], [place] [address] [place], [place] [address]  Glucose, capillary     Status: None   Collection Time: 08/14/18 11:23 AM  Result Value Ref Range   Glucose-Capillary 99 70 - 99 mg/dL   Comment 1 Notify RN    Comment 2 Document in Chart     Dg Chest 2 View  Result Date: 08/12/2018 CLINICAL DATA:  Shortness of breath with exertion, orthopnea. History of diabetes, hypertension and CHF. EXAM: CHEST - 2 VIEW COMPARISON:  Chest radiograph June 06, 2018 FINDINGS: Cardiac silhouette is upper limits of normal in size. Mediastinal silhouette is not suspicious. Interstitial prominence without pleural effusion or focal consolidation. No pneumothorax. Large body habitus. Osseous structures are nonacute. IMPRESSION: 1. Increased interstitial prominence seen with pulmonary edema or atypical infection. 2. Borderline cardiomegaly. Electronically Signed   By: Elon Alas M.D.   On: 08/12/2018 18:04    Review of Systems  Constitutional: Negative for weight loss.  HENT: Negative for ear discharge, ear pain, hearing loss and tinnitus.   Eyes: Negative for blurred vision, double vision, photophobia and pain.  Respiratory: Negative for cough, sputum production and shortness of breath.   Cardiovascular: Negative for chest pain.  Gastrointestinal: Negative for abdominal pain, nausea and vomiting.  Genitourinary: Negative for dysuria, flank pain, frequency and urgency.  Musculoskeletal: Positive for joint pain (Left index finger). Negative for back  pain, falls, myalgias and neck pain.  Neurological: Negative for dizziness, tingling, sensory change, focal weakness, loss of consciousness and headaches.  Endo/Heme/Allergies: Does not bruise/bleed easily.  Psychiatric/Behavioral: Negative for depression, memory loss and substance abuse. The patient is not nervous/anxious.    Blood pressure 134/82, pulse 81, temperature (!) 97.5 F (36.4 C), temperature source Oral, resp. rate 18, height 5\' 2"  (1.575 m), weight 115.5 kg, last menstrual period 07/26/2018, SpO2 97 %. Physical Exam  Constitutional: She appears well-developed and well-nourished. No distress.  HENT:  Head: Normocephalic and atraumatic.  Eyes: Conjunctivae are normal. Right eye exhibits no discharge. Left eye exhibits no discharge. No scleral icterus.  Neck: Normal range of motion.  Cardiovascular: Normal rate and regular rhythm.  Respiratory: Effort normal. No respiratory distress.  Musculoskeletal:     Comments: Left shoulder, elbow, wrist, digits- no skin wounds, index finger pad with abscess, mod TTP, mild edema P3, no instability, no blocks to motion  Sens  Ax/R/M/U intact  Mot   Ax/ R/ PIN/ M/ AIN/ U intact  Rad 2+  Neurological: She is alert.  Skin: Skin is warm and dry. She is not diaphoretic.  Psychiatric: She has a normal mood and affect. Her behavior is normal.      Assessment/Plan: Left index finger abscess -- Unroofed abscess, cultured and sent for GS,  C&Carla. No obvious communication with deep space. Suggest IV abx while inpatient.  Multiple medical problems including chronic combined CHF, CKD stage IV following with [place]  kidney, chronic anemia, diabetes type 2, uncontrolled hypertension, and gout -- per primary service    Lisette Abu, PA-C Orthopedic Surgery [phone] 08/14/2018, 4:09 PM    38 yo female with multiple medical comorbidities who developed an infection of the volar surface of her left index finger, appearing to be largely an  intradermal process, essentially a purulent blister.  The desquamated epidermis has been removed, and the pulp was not particularly tense.  This did not appear to be a felon.patient is presently on antibiotics under the direction of her service.  Purulence was sent for culture to assist primary service in antibiotic management.  Please re-consult if any surgical indications develop.  Milly Jakob, MD Hand surgery [place], [phone]

## 2018-08-14 NOTE — Progress Notes (Signed)
Nutrition Consult/Brief Note  RD consulted for nutrition education regarding a low sodium diet. Pt not feeling well upon visit today; politely declined education.  Lipid Panel     Component Value Date/Time   CHOL 194 05/27/2018 0349   TRIG 190 (H) 05/27/2018 0349   HDL 40 (L) 05/27/2018 0349   CHOLHDL 4.9 05/27/2018 0349   VLDL 38 05/27/2018 0349   LDLCALC 116 (H) 05/27/2018 0349   RD provided "Low Sodium Nutrition Therapy" handout from the Academy of Nutrition [employer] Dietetics.   Body mass index is 46.57 kg/m. Pt meets criteria for Obesity Class III based on current BMI.  Current diet order is Renal/Carbohydrate Modified, patient is consuming approximately 100% of meals at this time. Labs and medications reviewed.   No further nutrition interventions warranted at this time.  If additional nutrition issues arise, please re-consult RD.  Arthur Holms, RD, LDN Pager #: [phone] After-Hours Pager #: [phone]

## 2018-08-14 NOTE — Progress Notes (Signed)
Admit: 08/12/2018 LOS: 2  72F with Acute exacerbation of CHF, Progressive CKD4 from diabetic nephropathy.  Subjective:  . 0.8 L urine output documented yesterday . Serum creatinine stable at 4.4, BUN 44, potassium 4.5 . Weight stable compared to admission . On room air, denies dyspnea when walking around in room . Is aware of progressive CKD and need for vascular access . Has follow-up with Dr. Moshe Cipro next week . Admits to dietary sodium indiscretion, restaurant food especially [place] food. . Continues to have peripheral edema, states might be a tad improved  02/20 0701 - 02/21 0700 In: 240 [P.O.:240] Out: -   Filed Weights   08/12/18 1730 08/14/18 0352  Weight: 115.7 kg 115.5 kg    Scheduled Meds: . allopurinol  100 mg Oral Daily  . aspirin EC  81 mg Oral Daily  . calcitRIOL  0.25 mcg Oral Q M,W,F  . carvedilol  25 mg Oral BID WC  . furosemide  80 mg Intravenous Q12H  . heparin  5,000 Units Subcutaneous Q8H  . hydrALAZINE  50 mg Oral TID  . insulin aspart  0-9 Units Subcutaneous TID WC  . rosuvastatin  40 mg Oral QHS  . sodium bicarbonate  650 mg Oral BID   Continuous Infusions: PRN Meds:.acetaminophen **OR** acetaminophen, hydrALAZINE, ondansetron **OR** ondansetron (ZOFRAN) IV  Current Labs: reviewed    Physical Exam:  Blood pressure (!) 156/90, pulse 84, temperature 97.8 F (36.6 C), temperature source Oral, resp. rate 16, height 5\' 2"  (1.575 m), weight 115.5 kg, last menstrual period 07/26/2018, SpO2 98 %. No acute distress, morbidly obese RRR, normal S1 and S2 without rub Faint crackles in the bases otherwise clear 2+ peripheral edema distal to the knees, pitting Nonfocal, no asterixis EOMI NCAT  A 1. Progressive diabetic nephropathy, CKD 4/5, followed by Dr. Moshe Cipro; most likely to require dialysis within the next 6 months 2. Acute on chronic CHF exacerbation with hypervolemia secondary to dietary sodium indiscretion, progressive CKD 3. Dietary  sodium indiscretion 4. Type 2 diabetes 5. Anemia of CKD; received Aranesp 2/20,  6. CKD BMD, no inpatient issues, on calcitriol 7. Hypertension, stable but not controlled, needs more diuresis 8. Metabolic acidosis on sodium bicarbonate  P . Increase furosemide to 120 mg IV 3 times daily . Stop sodium bicarbonate secondary to hypervolemia . Dietitian consult to provide education regarding outpatient low-sodium diet . Add iron iron levels and ferritin; might need IV iron prior to discharge . Will need vascular access in the near future, probably can be done as an outpatient if remains stable/improved . Medication Issues; o Preferred narcotic agents for pain control are hydromorphone, fentanyl, and methadone. Morphine should not be used.  o Baclofen should be avoided o Avoid oral sodium phosphate and magnesium citrate based laxatives / bowel preps    Pearson Grippe MD 08/14/2018, 9:59 AM  Recent Labs  Lab 08/12/18 1858 08/13/18 0256 08/14/18 0316  NA 138 139 138  K 4.5 4.7 4.5  CL 113* 111 110  CO2 18* 18* 19*  GLUCOSE 124* 104* 91  BUN 43* 44* 44*  CREATININE 4.31* 4.36* 4.40*  CALCIUM 8.0* 8.1* 7.8*  PHOS  --   --  6.2*   Recent Labs  Lab 08/12/18 1858 08/13/18 0256 08/14/18 0316  WBC 4.0 3.9* 3.6*  NEUTROABS 2.0 2.1 1.7  HGB 9.0* 8.5* 8.5*  HCT 28.6* 28.5* 27.3*  MCV 90.8 91.6 88.9  PLT 275 262 267

## 2018-08-14 NOTE — Progress Notes (Deleted)
Further management will depend on hospital course.

## 2018-08-15 LAB — GLUCOSE, CAPILLARY
Glucose-Capillary: 124 mg/dL — ABNORMAL HIGH (ref 70–99)
Glucose-Capillary: 105 mg/dL — ABNORMAL HIGH (ref 70–99)
Glucose-Capillary: 96 mg/dL (ref 70–99)
Glucose-Capillary: 199 mg/dL — ABNORMAL HIGH (ref 70–99)

## 2018-08-15 LAB — BASIC METABOLIC PANEL
Anion gap: 9 (ref 5–15)
BUN: 45 mg/dL — AB (ref 6–20)
CO2: 18 mmol/L — ABNORMAL LOW (ref 22–32)
Calcium: 7.8 mg/dL — ABNORMAL LOW (ref 8.9–10.3)
Chloride: 110 mmol/L (ref 98–111)
Creatinine, Ser: 4.26 mg/dL — ABNORMAL HIGH (ref 0.44–1.00)
GFR calc Af Amer: 15 mL/min — ABNORMAL LOW (ref >60)
GFR calc non Af Amer: 13 mL/min — ABNORMAL LOW (ref >60)
Glucose, Bld: 137 mg/dL — ABNORMAL HIGH (ref 70–99)
Potassium: 4.1 mmol/L (ref 3.5–5.1)
Sodium: 137 mmol/L (ref 135–145)

## 2018-08-15 MED ORDER — TORSEMIDE 20 MG PO TABS
100.0000 mg | ORAL_TABLET | Freq: Two times a day (BID) | ORAL | Status: DC
  Administered 2018-08-15 – 2018-08-16 (×2): 100 mg via ORAL
  Filled 2018-08-15 (×2): qty 5

## 2018-08-15 MED ORDER — PHENOL 1.4 % MT LIQD
1.0000 | OROMUCOSAL | Status: DC | PRN
  Administered 2018-08-15: 1 via OROMUCOSAL
  Filled 2018-08-15: qty 177

## 2018-08-15 NOTE — Progress Notes (Signed)
PROGRESS NOTE    Carla Compton  JQB:[id number] DOB: Aug 19, 1980 DOA: 08/12/2018 PCP: Abigail Miyamoto, FNP   Brief Narrative: Patient is a 38 year old female with past medical history of chronic combined CHF, CKD stage IV following with [place]  kidney, chronic anemia, diabetes type 2, uncontrolled hypertension, gout who presents the emergency department complaints of increasing shortness of breath.  Reported of increasing shortness of breath since last 3 to 4 days, orthopnea and exertional dyspnea.  Patient admitted for the management of CHF exacerbation and AKI on CKD.  Nephrology consulted today.  08/14/2018: Patient seen.  Patient is slowly improving.  We will continue current management for now.  Nephrology input is highly appreciated.  Patient is currently on IV Lasix 120 mg 8 hours.  Nephrology is directing diuresis.  08/15/2018: Patient continues to improve.  Patient has been transitioned to oral diuretics.  Nephrology input is highly appreciated.  Likely, patient be discharged back on tomorrow.  Assessment & Plan:   Principal Problem:   Acute pulmonary edema (HCC) Active Problems:   Uncontrolled secondary diabetes mellitus with stage 4 CKD (GFR 15-29) (HCC)   CKD (chronic kidney disease) stage 4, GFR 15-29 ml/min (HCC)   Acute on chronic combined systolic and diastolic CHF (congestive heart failure) (HCC)   Acute respiratory failure with hypoxia:  Likely from pulmonary edema.   Resolved. Patient is currently on room air.  Acute on chronic combined CHF:  BNP of 436.  Chest x-ray on presentation showed pulmonary edema.  She states she was compliant with her torsemide at home.  Continue IV Lasix 80 mg twice a day at present.  Continue to monitor input and output.  Fluid restriction to less than 1 L a day. Follows with cardiology as an outpatient.  Last echocardiogram done in December 2019 showed ejection fraction of 45 to 40%, grade 2 diastolic dysfunction. She has moderate  bilateral lower extremity edema.  Negative JVD. 08/14/2018: Patient seen.  Patient is slowly improving.  We will continue current management for now.  Nephrology input is highly appreciated.  Patient is currently on IV Lasix 120 mg 8 hours.  Nephrology is directing diuresis. 08/15/2018: Patient continues to improve.  Patient has been transitioned to oral diuretics.  Nephrology input is highly appreciated.  Likely, patient be discharged back on tomorrow.  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  AKI due to cardiorenal syndrome. Baseline creatinine around 3.7. Nephrology consulted.  Continue to monitor renal function.  Avoid nephrotoxins. 08/15/2018: Nephrology team is directing care.  Stable.  Diabetes mellitus type 2:  On insulin at home.  Continue sliding's insulin here.  Her last hemoglobin A1c in 05/2018 was 7.1. 08/15/2018: Continue to optimize.  Hypertension:  Likely uncontrolled.   On Coreg and hydralazine at home. 08/15/2018: The blood pressure is better controlled.  Last blood pressure was 134/82 mmHg.  History of gout:  On allopurinol.  There was suspicion for right foot osteomyelitis on last admission but she follows with [name] and she has been cleared from that.  No active ulcers or wounds  Hyperlipidemia:  Continue statin  Chronic normocytic anemia:  Secondary to chronic kidney disease.  Continue to monitor.  Morbid obesity: BMI of 46.6  DVT prophylaxis:Heparin [place] Code Status: Full Family Communication: None present at the bedside Disposition Plan: Home after stabilization of CHF and acute kidney injury  Consultants: Nephrology  Procedures: None  Antimicrobials:  Anti-infectives (From admission, onward)   Start  Dose/Rate Route Frequency Ordered Stop   08/14/18 1800  doxycycline (VIBRA-TABS) tablet 100 mg     100 mg Oral Every 12 hours 08/14/18 1550        Subjective: No new complaints No shortness  of breath. No chest pain.  Objective: Vitals:   08/14/18 1255 08/14/18 1927 08/15/18 0608 08/15/18 0857  BP: 134/82 136/77 (!) 143/90 (!) 158/89  Pulse: 81 87 84   Resp: 18 18 14    Temp: (!) 97.5 F (36.4 C) 98 F (36.7 C) 98 F (36.7 C)   TempSrc: Oral Oral Oral   SpO2: 97% 95% 98%   Weight:   114.8 kg   Height:        Intake/Output Summary (Last 24 hours) at 08/15/2018 1321 Last data filed at 08/15/2018 1300 Gross per 24 hour  Intake 480 ml  Output 950 ml  Net -470 ml   Filed Weights   08/12/18 1730 08/14/18 0352 08/15/18 2836  Weight: 115.7 kg 115.5 kg 114.8 kg    Examination:  General exam: Appears calm and comfortable ,Not in distress, morbidly obese  HEENT: Pallor.  No jaundice. Respiratory system: Decreased air entry globally. Cardiovascular system: S1 & S2 heard Gastrointestinal system: Abdomen is obese, soft and nontender.  Organs are not palpable.  Central nervous system: Alert and oriented.  Patient moves all limbs.   Extremities: Edema of the extremities.  Data Reviewed: I have personally reviewed following labs and imaging studies  CBC: Recent Labs  Lab 08/12/18 1858 08/13/18 0256 08/14/18 0316  WBC 4.0 3.9* 3.6*  NEUTROABS 2.0 2.1 1.7  HGB 9.0* 8.5* 8.5*  HCT 28.6* 28.5* 27.3*  MCV 90.8 91.6 88.9  PLT 275 262 629   Basic Metabolic Panel: Recent Labs  Lab 08/12/18 1858 08/13/18 0256 08/14/18 0316 08/15/18 0912  NA 138 139 138 137  K 4.5 4.7 4.5 4.1  CL 113* 111 110 110  CO2 18* 18* 19* 18*  GLUCOSE 124* 104* 91 137*  BUN 43* 44* 44* 45*  CREATININE 4.31* 4.36* 4.40* 4.26*  CALCIUM 8.0* 8.1* 7.8* 7.8*  PHOS  --   --  6.2*  --    GFR: Estimated Creatinine Clearance: 22.1 mL/min (A) (by C-G formula based on SCr of 4.26 mg/dL (H)). Liver Function Tests: Recent Labs  Lab 08/12/18 1858 08/13/18 0256 08/14/18 0316  AST 26 26  --   ALT 29 28  --   ALKPHOS 168* 157*  --   BILITOT 0.5 0.4  --   PROT 7.2 6.8  --   ALBUMIN 2.5* 2.3*  2.3*   No results for input(s): LIPASE, AMYLASE in the last 168 hours. No results for input(s): AMMONIA in the last 168 hours. Coagulation Profile: No results for input(s): INR, PROTIME in the last 168 hours. Cardiac Enzymes: No results for input(s): CKTOTAL, CKMB, CKMBINDEX, TROPONINI in the last 168 hours. BNP (last 3 results) No results for input(s): PROBNP in the last 8760 hours. HbA1C: No results for input(s): HGBA1C in the last 72 hours. CBG: Recent Labs  Lab 08/14/18 1123 08/14/18 1621 08/14/18 2149 08/15/18 0610 08/15/18 1120  GLUCAP 99 108* 138* 124* 105*   Lipid Profile: No results for input(s): CHOL, HDL, LDLCALC, TRIG, CHOLHDL, LDLDIRECT in the last 72 hours. Thyroid Function Tests: No results for input(s): TSH, T4TOTAL, FREET4, T3FREE, THYROIDAB in the last 72 hours. Anemia Panel: Recent Labs    08/14/18 1049  FERRITIN 198  TIBC 186*  IRON 65   Sepsis Labs: No  results for input(s): PROCALCITON, LATICACIDVEN in the last 168 hours.  Recent Results (from the past 240 hour(s))  Aerobic Culture (superficial specimen)     Status: None (Preliminary result)   Collection Time: 08/14/18  4:42 PM  Result Value Ref Range Status   Specimen Description WOUND LEFT HAND  Final   Special Requests NONE  Final   Gram Stain   Final    ABUNDANT WBC PRESENT, PREDOMINANTLY PMN MODERATE GRAM POSITIVE COCCI Performed at [facility], [address] [place], [place] [address]    Culture PENDING  Incomplete   Report Status PENDING  Incomplete         Radiology Studies: No results found.      Scheduled Meds: . allopurinol  100 mg Oral Daily  . aspirin EC  81 mg Oral Daily  . calcitRIOL  0.25 mcg Oral Q M,W,F  . carvedilol  25 mg Oral BID WC  . doxycycline  100 mg Oral Q12H  . heparin  5,000 Units Subcutaneous Q8H  . hydrALAZINE  50 mg Oral TID  . insulin aspart  0-9 Units Subcutaneous TID WC  . rosuvastatin  40 mg Oral QHS  . torsemide  100 mg Oral  BID   Continuous Infusions:    LOS: 3 days    Time spent: 25 mins.  Bonnell Public, MD [facility] Pager [phone]  If 7PM-7AM, please contact night-coverage [email or web address] Password [email or web address] 08/15/2018, 1:21 PM

## 2018-08-15 NOTE — Progress Notes (Signed)
Admit: 08/12/2018 LOS: 3  48F with Acute exacerbation of CHF, Progressive CKD4 from diabetic nephropathy.  Subjective:   . Seen by ortho for finger abscess, unroofed;  . 1.9L UOP yesterday . No AM Labs . Weights downtrending; denies SOB; edema much improved . Met with RD  02/21 0701 - 02/22 0700 In: 360 [P.O.:360] Out: 1850 [Urine:1850]  Filed Weights   08/12/18 1730 08/14/18 0352 08/15/18 8341  Weight: 115.7 kg 115.5 kg 114.8 kg    Scheduled Meds: . allopurinol  100 mg Oral Daily  . aspirin EC  81 mg Oral Daily  . calcitRIOL  0.25 mcg Oral Q M,W,F  . carvedilol  25 mg Oral BID WC  . doxycycline  100 mg Oral Q12H  . heparin  5,000 Units Subcutaneous Q8H  . hydrALAZINE  50 mg Oral TID  . insulin aspart  0-9 Units Subcutaneous TID WC  . rosuvastatin  40 mg Oral QHS   Continuous Infusions: . furosemide 120 mg (08/15/18 0853)   PRN Meds:.acetaminophen **OR** acetaminophen, hydrALAZINE, ondansetron **OR** ondansetron (ZOFRAN) IV  Current Labs: reviewed  Results for Carla, Compton (MRN [id number]) as of 08/15/2018 09:00  Ref. Range 08/14/2018 10:49  Saturation Ratios Latest Ref Range: 10.4 - 31.8 % 35 (H)  Ferritin Latest Ref Range: 11 - 307 ng/mL 198    Physical Exam:  Blood pressure (!) 158/89, pulse 84, temperature 98 F (36.7 C), temperature source Oral, resp. rate 14, height '5\' 2"'  (1.575 m), weight 114.8 kg, last menstrual period 07/26/2018, SpO2 98 %. No acute distress, morbidly obese RRR, normal S1 and S2 without rub Faint crackles in the bases otherwise clear trace peripheral edema distal to the knees, pitting Nonfocal, no asterixis EOMI NCAT   A 1. Progressive diabetic nephropathy, CKD 4/5, followed by Dr. Moshe Cipro; most likely to require dialysis within the next 6 months 2. Acute on chronic CHF exacerbation with hypervolemia secondary to dietary sodium indiscretion, progressive CKD 3. Dietary sodium indiscretion 4. Type 2 diabetes 5. Anemia of CKD;  received Aranesp 2/20,  6. CKD BMD, no inpatient issues, on calcitriol 7. Hypertension, stable but not controlled, needs more diuresis 8. Metabolic acidosis on sodium bicarbonate  P . Change to torsemide 162m po qAM and optional 2nd dose in PM . Give 1073mtorsemide this PM to see effect on UOP . Stop furosemide . F/u AM labs . Fe replete, no Fe needed, prob to need ESA as outpt . Will need vascular access in the near future, probably can be done as an outpatient if remains stable/improved . Medication Issues; o Preferred narcotic agents for pain control are hydromorphone, fentanyl, and methadone. Morphine should not be used.  o Baclofen should be avoided o Avoid oral sodium phosphate and magnesium citrate based laxatives / bowel preps    RyPearson GrippeD 08/15/2018, 8:59 AM  Recent Labs  Lab 08/12/18 1858 08/13/18 0256 08/14/18 0316  NA 138 139 138  K 4.5 4.7 4.5  CL 113* 111 110  CO2 18* 18* 19*  GLUCOSE 124* 104* 91  BUN 43* 44* 44*  CREATININE 4.31* 4.36* 4.40*  CALCIUM 8.0* 8.1* 7.8*  PHOS  --   --  6.2*   Recent Labs  Lab 08/12/18 1858 08/13/18 0256 08/14/18 0316  WBC 4.0 3.9* 3.6*  NEUTROABS 2.0 2.1 1.7  HGB 9.0* 8.5* 8.5*  HCT 28.6* 28.5* 27.3*  MCV 90.8 91.6 88.9  PLT 275 262 267

## 2018-08-16 LAB — BASIC METABOLIC PANEL
Anion gap: 7 (ref 5–15)
BUN: 44 mg/dL — ABNORMAL HIGH (ref 6–20)
CALCIUM: 8 mg/dL — AB (ref 8.9–10.3)
CO2: 19 mmol/L — ABNORMAL LOW (ref 22–32)
Chloride: 110 mmol/L (ref 98–111)
Creatinine, Ser: 4.27 mg/dL — ABNORMAL HIGH (ref 0.44–1.00)
GFR calc Af Amer: 15 mL/min — ABNORMAL LOW (ref >60)
GFR calc non Af Amer: 13 mL/min — ABNORMAL LOW (ref >60)
Glucose, Bld: 125 mg/dL — ABNORMAL HIGH (ref 70–99)
Potassium: 4.2 mmol/L (ref 3.5–5.1)
Sodium: 136 mmol/L (ref 135–145)

## 2018-08-16 LAB — GLUCOSE, CAPILLARY
Glucose-Capillary: 90 mg/dL (ref 70–99)
Glucose-Capillary: 111 mg/dL — ABNORMAL HIGH (ref 70–99)

## 2018-08-16 MED ORDER — CALCITRIOL 0.25 MCG PO CAPS: 0.2500 ug | ORAL_CAPSULE | ORAL | 0 refills | Status: AC

## 2018-08-16 MED ORDER — TORSEMIDE 100 MG PO TABS: 100.0000 mg | ORAL_TABLET | Freq: Two times a day (BID) | ORAL | 0 refills | Status: AC

## 2018-08-16 MED ORDER — DOXYCYCLINE HYCLATE 100 MG PO TABS: 100.0000 mg | ORAL_TABLET | Freq: Two times a day (BID) | ORAL | 0 refills | Status: AC

## 2018-08-16 NOTE — Progress Notes (Signed)
Discharge AVS meds take and those due reviewed with pt. Follow up appointments and when to call MD reviewed. All questions and concerns addressed. No further questions at this time. D/c IV and TELE, CCMD notified. D/C home per orders. Pt brought down via wheelchair with family and staff.

## 2018-08-16 NOTE — Progress Notes (Signed)
Admit: 08/12/2018 LOS: 4  55F with Acute exacerbation of CHF, Progressive CKD4 from diabetic nephropathy.  Subjective:   . Seems to have responded to 100mg  Torsemide, 1L UOP overnight . Need AM labs . Labs yesterday stable . No SOB or CP, edema is improving  02/22 0701 - 02/23 0700 In: 900 [P.O.:900] Out: 1600 [Urine:1600]  Filed Weights   08/14/18 0352 08/15/18 0608 08/16/18 0522  Weight: 115.5 kg 114.8 kg 114.9 kg    Scheduled Meds: . allopurinol  100 mg Oral Daily  . aspirin EC  81 mg Oral Daily  . calcitRIOL  0.25 mcg Oral Q M,W,F  . carvedilol  25 mg Oral BID WC  . doxycycline  100 mg Oral Q12H  . heparin  5,000 Units Subcutaneous Q8H  . hydrALAZINE  50 mg Oral TID  . insulin aspart  0-9 Units Subcutaneous TID WC  . rosuvastatin  40 mg Oral QHS  . torsemide  100 mg Oral BID   Continuous Infusions:  PRN Meds:.acetaminophen **OR** acetaminophen, hydrALAZINE, ondansetron **OR** ondansetron (ZOFRAN) IV, phenol  Current Labs: reviewed  Results for HALO, SHEVLIN (MRN [id number]) as of 08/15/2018 09:00  Ref. Range 08/14/2018 10:49  Saturation Ratios Latest Ref Range: 10.4 - 31.8 % 35 (H)  Ferritin Latest Ref Range: 11 - 307 ng/mL 198    Physical Exam:  Blood pressure (!) 145/81, pulse 88, temperature 98.1 F (36.7 C), temperature source Oral, resp. rate 16, height 5\' 2"  (1.575 m), weight 114.9 kg, last menstrual period 07/26/2018, SpO2 94 %. No acute distress, morbidly obese RRR, normal S1 and S2 without rub Faint crackles in the bases otherwise clear trace peripheral edema distal to the knees, pitting Nonfocal, no asterixis EOMI NCAT   A 1. Progressive diabetic nephropathy, CKD 4/5, followed by Dr. Moshe Cipro; most likely to require dialysis within the next 6 months 2. Acute on chronic CHF exacerbation with hypervolemia secondary to dietary sodium indiscretion, progressive CKD 3. Dietary sodium indiscretion 4. Type 2 diabetes 5. Anemia of CKD; received  Aranesp 2/20,  6. CKD BMD, no inpatient issues, on calcitriol 7. Hypertension, stable but not controlled, needs more diuresis 8. Metabolic acidosis on sodium bicarbonate  P . Cont torsemide 100mg  po qAM and optional 2nd dose in PM -- schedule while inpt . F/u AM labs . If labs are stable I think ok for DC with close f/u at [facility], will make appts . Will need vascular access in the near future, probably can be done as an outpatient if remains stable/improved . Medication Issues; o Preferred narcotic agents for pain control are hydromorphone, fentanyl, and methadone. Morphine should not be used.  o Baclofen should be avoided o Avoid oral sodium phosphate and magnesium citrate based laxatives / bowel preps    Pearson Grippe MD 08/16/2018, 10:16 AM  Recent Labs  Lab 08/13/18 0256 08/14/18 0316 08/15/18 0912  NA 139 138 137  K 4.7 4.5 4.1  CL 111 110 110  CO2 18* 19* 18*  GLUCOSE 104* 91 137*  BUN 44* 44* 45*  CREATININE 4.36* 4.40* 4.26*  CALCIUM 8.1* 7.8* 7.8*  PHOS  --  6.2*  --    Recent Labs  Lab 08/12/18 1858 08/13/18 0256 08/14/18 0316  WBC 4.0 3.9* 3.6*  NEUTROABS 2.0 2.1 1.7  HGB 9.0* 8.5* 8.5*  HCT 28.6* 28.5* 27.3*  MCV 90.8 91.6 88.9  PLT 275 262 267

## 2018-08-16 NOTE — Discharge Summary (Signed)
Physician Discharge Summary  Patient ID: Carla Compton MRN: [id number] DOB/AGE: 1981/01/03 38 y.o.  Admit date: 08/12/2018 Discharge date: 08/16/2018  Admission Diagnoses:  Discharge Diagnoses:  Principal Problem:   Acute pulmonary edema ([place])   Acute on chronic combined systolic and diastolic congestive heart failure   Abscess tip of left second finger secondary to MRSA   Uncontrolled secondary diabetes mellitus with stage 4 CKD (GFR 15-29) (HCC)   CKD (chronic kidney disease) stage 4, GFR 15-29 ml/min (HCC)   Acute on chronic combined systolic and diastolic CHF (congestive heart failure) ([place])   Discharged Condition: stable  Hospital Course:  Patient is a 38 year old female with past medical history of chronic combined CHF, CKD stage IV following with [place]  kidney, chronic anemia, diabetes type 2, uncontrolled hypertension and gout.  Patient presented with worsening shortness of breath, orthopnea, dyspnea on exertion and edema.  Patient was admitted and managed for acute on chronic combined systolic and diastolic congestive heart failure/pulmonary edema.  Patient was initially managed with IV diuretics, but later changed to oral torsemide 100 mg p.o. twice daily.  With adequate diuresis, patient's symptoms have resolved significantly.  Will be discharged back on to the care of the primary care provider, cardiology and nephrology team.   Need to discuss renal replacement therapy options with the primary nephrologist was discussed with the patient, patient's mother and stepfather.  Acute respiratory failure with hypoxia:  Likely secondary to pulmonary edema.    Resolved with improvement in pulmonary edema.  Acute on chronic combined systolic and diastolic CHF, with EF of 45 to 50% (as per prior echo):  BNP of 436.   Chest x-ray on presentation showed pulmonary edema.   Patient was initially managed with IV Lasix, but later changed to oral torsemide 100 mg p.o. twice daily.   Last  echocardiogram done this as documented below. Patient symptoms have improved significantly. Patient will be discharged back to the care of the primary care provider, nephrology and cardiology.  AKI on CKD stage IV versus progressive disease to CKD 5:  Patient's renal function has remained stable. Patient will follow with nephrology on discharge. Discussed renal replacement therapy options with the primary nephrologist on discharge.  Diabetes mellitus type 2:  On insulin at home.   Continue to optimize.   Last hemoglobin A1c on 05/2018 was 7.1%.  Hypertension:  Continue to monitor closely. Continue to optimize.    History of gout:  On allopurinol.    Abscess tip of left second finger: Status post I&D by the hand surgery team. Culture grew MRSA. Discharged on doxycycline.  Hyperlipidemia:  Continue statin  Chronic normocytic anemia:  Secondary to chronic kidney disease.   Continue to monitor.  Morbid obesity: BMI of 46.6  Consults: nephrology and orthopedic surgery  Significant Diagnostic Studies:  Chest x-ray revealed "Increased interstitial prominence seen with pulmonary edema or atypical infection.  Borderline cardiomegaly".  Echocardiogram done on 05/25/2018 revealed: Left ventricle: The cavity size was mildly dilated. There was   mild concentric hypertrophy. Systolic function was mildly   reduced. The estimated ejection fraction was in the range of 45%   to 50%. Diffuse hypokinesis. Features are consistent with a   pseudonormal left ventricular filling pattern, with concomitant   abnormal relaxation and increased filling pressure (grade 2   diastolic dysfunction). - Left atrium: The atrium was mildly dilated.  Culture from the left finger abscess grew MRSA.  Discharge Exam: Blood pressure (!) 145/81, pulse 88, temperature 98.1 F (36.7 C),  temperature source Oral, resp. rate 16, height 5\' 2"  (1.575 m), weight 114.9 kg, last menstrual period 07/26/2018,  SpO2 94 %.  Disposition: Discharge disposition: 01-Home or Self Care  Discharge Instructions    Diet - low sodium heart healthy   Complete by:  As directed    Diet Carb Modified   Complete by:  As directed    Increase activity slowly   Complete by:  As directed      Allergies as of 08/16/2018      Reactions   Contrast Media [iodinated Diagnostic Agents]       Medication List    STOP taking these medications   sodium bicarbonate 650 MG tablet     TAKE these medications   allopurinol 100 MG tablet Commonly known as:  ZYLOPRIM Take 1 tablet (100 mg total) by mouth daily. What changed:  Another medication with the same name was removed. Continue taking this medication, and follow the directions you see here.   aspirin 81 MG EC tablet Take 1 tablet (81 mg total) by mouth daily.   calcitRIOL 0.25 MCG capsule Commonly known as:  ROCALTROL Take 1 capsule (0.25 mcg total) by mouth every Monday, Wednesday, and Friday. Start taking on:  August 17, 2018   carvedilol 25 MG tablet Commonly known as:  COREG Take 1 tablet (25 mg total) by mouth 2 (two) times daily with a meal.   doxycycline 100 MG tablet Commonly known as:  VIBRA-TABS Take 1 tablet (100 mg total) by mouth every 12 (twelve) hours for 5 days.   hydrALAZINE 50 MG tablet Commonly known as:  APRESOLINE Take 1 tablet (50 mg total) by mouth 3 (three) times daily.   PROAIR HFA 108 (90 Base) MCG/ACT inhaler Generic drug:  albuterol Inhale 2 puffs into the lungs every 4 (four) hours as needed for wheezing or shortness of breath.   rosuvastatin 40 MG tablet Commonly known as:  CRESTOR Take 40 mg by mouth at bedtime.   torsemide 100 MG tablet Commonly known as:  DEMADEX Take 1 tablet (100 mg total) by mouth 2 (two) times daily. What changed:    medication strength  how much to take  when to take this        Signed: Bonnell Public 08/16/2018, 12:47 PM

## 2018-08-17 LAB — AEROBIC CULTURE W GRAM STAIN (SUPERFICIAL SPECIMEN)

## 2018-08-17 NOTE — Progress Notes (Signed)
Ok we'll get uric acid at follow up

## 2018-08-24 ENCOUNTER — Ambulatory Visit: Payer: BLUE CROSS/BLUE SHIELD | Admitting: Internal Medicine

## 2018-08-24 DIAGNOSIS — R0989 Other specified symptoms and signs involving the circulatory and respiratory systems: Secondary | ICD-10-CM

## 2018-08-25 ENCOUNTER — Encounter: Payer: Self-pay | Admitting: *Deleted

## 2018-08-26 NOTE — Progress Notes (Signed)
Patient is still in hospital at this time.

## 2018-08-28 ENCOUNTER — Telehealth: Payer: Self-pay | Admitting: Internal Medicine

## 2018-08-28 NOTE — Telephone Encounter (Signed)
Spoke with pt who states since the middle of Feb, she's been having c/o of extreme fatigue. She state even when she get up to walk to the bathroom from her bedroom, she's tired. She report she occasionally have some weight gain, swelling and SOB but it's usually resolved by taking an extra dose of her torsemide. She states BP has been around 140/80's and HR 84-85.   Pt informed first available appointment is March 17. Pt states she can not wait that long an feels terrible. Pt advised to contact PCP or report to Urgent care for sooner appointment but will route to PA for any further recommendations.

## 2018-08-28 NOTE — Telephone Encounter (Signed)
New Message   Patient states she very fatigued not having pain or SOB, but just tired and weak when moving around.

## 2018-09-07 NOTE — Telephone Encounter (Signed)
Previously seen by [place]. Need earlier visit.

## 2018-09-07 NOTE — Telephone Encounter (Signed)
Called patient and got her scheduled for Thursday 09-10-2018 at 9 AM

## 2018-09-10 ENCOUNTER — Ambulatory Visit: Payer: BLUE CROSS/BLUE SHIELD | Admitting: Physician Assistant

## 2018-09-15 ENCOUNTER — Encounter: Payer: Self-pay | Admitting: *Deleted

## 2018-10-17 ENCOUNTER — Other Ambulatory Visit (INDEPENDENT_AMBULATORY_CARE_PROVIDER_SITE_OTHER): Payer: Self-pay | Admitting: Orthopedic Surgery

## 2019-04-10 ENCOUNTER — Other Ambulatory Visit (INDEPENDENT_AMBULATORY_CARE_PROVIDER_SITE_OTHER): Payer: Self-pay | Admitting: Orthopedic Surgery

## 2019-04-16 ENCOUNTER — Inpatient Hospital Stay (HOSPITAL_COMMUNITY)
Admission: AD | Admit: 2019-04-16 | Discharge: 2019-05-25 | DRG: 870 | Disposition: E | Payer: BLUE CROSS/BLUE SHIELD | Source: Other Acute Inpatient Hospital | Attending: Pulmonary Disease | Admitting: Pulmonary Disease

## 2019-04-16 DIAGNOSIS — K6812 Psoas muscle abscess: Secondary | ICD-10-CM | POA: Diagnosis present

## 2019-04-16 DIAGNOSIS — G9340 Encephalopathy, unspecified: Secondary | ICD-10-CM

## 2019-04-16 DIAGNOSIS — Z6841 Body Mass Index (BMI) 40.0 and over, adult: Secondary | ICD-10-CM | POA: Diagnosis not present

## 2019-04-16 DIAGNOSIS — A4102 Sepsis due to Methicillin resistant Staphylococcus aureus: Secondary | ICD-10-CM | POA: Diagnosis present

## 2019-04-16 DIAGNOSIS — J9690 Respiratory failure, unspecified, unspecified whether with hypoxia or hypercapnia: Secondary | ICD-10-CM

## 2019-04-16 DIAGNOSIS — D631 Anemia in chronic kidney disease: Secondary | ICD-10-CM | POA: Diagnosis present

## 2019-04-16 DIAGNOSIS — D649 Anemia, unspecified: Secondary | ICD-10-CM | POA: Diagnosis not present

## 2019-04-16 DIAGNOSIS — M059 Rheumatoid arthritis with rheumatoid factor, unspecified: Secondary | ICD-10-CM | POA: Diagnosis present

## 2019-04-16 DIAGNOSIS — Z833 Family history of diabetes mellitus: Secondary | ICD-10-CM

## 2019-04-16 DIAGNOSIS — J9621 Acute and chronic respiratory failure with hypoxia: Secondary | ICD-10-CM | POA: Diagnosis not present

## 2019-04-16 DIAGNOSIS — R6521 Severe sepsis with septic shock: Secondary | ICD-10-CM | POA: Diagnosis not present

## 2019-04-16 DIAGNOSIS — E1122 Type 2 diabetes mellitus with diabetic chronic kidney disease: Secondary | ICD-10-CM | POA: Diagnosis present

## 2019-04-16 DIAGNOSIS — J189 Pneumonia, unspecified organism: Secondary | ICD-10-CM | POA: Diagnosis not present

## 2019-04-16 DIAGNOSIS — R944 Abnormal results of kidney function studies: Secondary | ICD-10-CM | POA: Diagnosis present

## 2019-04-16 DIAGNOSIS — Z91041 Radiographic dye allergy status: Secondary | ICD-10-CM

## 2019-04-16 DIAGNOSIS — A419 Sepsis, unspecified organism: Secondary | ICD-10-CM | POA: Diagnosis not present

## 2019-04-16 DIAGNOSIS — S2243XA Multiple fractures of ribs, bilateral, initial encounter for closed fracture: Secondary | ICD-10-CM | POA: Diagnosis present

## 2019-04-16 DIAGNOSIS — Z7982 Long term (current) use of aspirin: Secondary | ICD-10-CM

## 2019-04-16 DIAGNOSIS — J9601 Acute respiratory failure with hypoxia: Secondary | ICD-10-CM | POA: Diagnosis not present

## 2019-04-16 DIAGNOSIS — Z20828 Contact with and (suspected) exposure to other viral communicable diseases: Secondary | ICD-10-CM | POA: Diagnosis present

## 2019-04-16 DIAGNOSIS — Z9911 Dependence on respirator [ventilator] status: Secondary | ICD-10-CM | POA: Diagnosis not present

## 2019-04-16 DIAGNOSIS — L089 Local infection of the skin and subcutaneous tissue, unspecified: Secondary | ICD-10-CM | POA: Diagnosis not present

## 2019-04-16 DIAGNOSIS — Z978 Presence of other specified devices: Secondary | ICD-10-CM

## 2019-04-16 DIAGNOSIS — K219 Gastro-esophageal reflux disease without esophagitis: Secondary | ICD-10-CM | POA: Diagnosis present

## 2019-04-16 DIAGNOSIS — I361 Nonrheumatic tricuspid (valve) insufficiency: Secondary | ICD-10-CM | POA: Diagnosis not present

## 2019-04-16 DIAGNOSIS — I469 Cardiac arrest, cause unspecified: Secondary | ICD-10-CM | POA: Diagnosis not present

## 2019-04-16 DIAGNOSIS — M7071 Other bursitis of hip, right hip: Secondary | ICD-10-CM

## 2019-04-16 DIAGNOSIS — G931 Anoxic brain damage, not elsewhere classified: Secondary | ICD-10-CM | POA: Diagnosis not present

## 2019-04-16 DIAGNOSIS — G92 Toxic encephalopathy: Secondary | ICD-10-CM | POA: Diagnosis present

## 2019-04-16 DIAGNOSIS — L0211 Cutaneous abscess of neck: Secondary | ICD-10-CM | POA: Diagnosis not present

## 2019-04-16 DIAGNOSIS — Z95828 Presence of other vascular implants and grafts: Secondary | ICD-10-CM

## 2019-04-16 DIAGNOSIS — I472 Ventricular tachycardia: Secondary | ICD-10-CM | POA: Diagnosis not present

## 2019-04-16 DIAGNOSIS — Z79899 Other long term (current) drug therapy: Secondary | ICD-10-CM

## 2019-04-16 DIAGNOSIS — E8770 Fluid overload, unspecified: Secondary | ICD-10-CM | POA: Diagnosis not present

## 2019-04-16 DIAGNOSIS — J9622 Acute and chronic respiratory failure with hypercapnia: Secondary | ICD-10-CM | POA: Diagnosis not present

## 2019-04-16 DIAGNOSIS — G9389 Other specified disorders of brain: Secondary | ICD-10-CM | POA: Diagnosis not present

## 2019-04-16 DIAGNOSIS — Z66 Do not resuscitate: Secondary | ICD-10-CM | POA: Diagnosis not present

## 2019-04-16 DIAGNOSIS — Z8614 Personal history of Methicillin resistant Staphylococcus aureus infection: Secondary | ICD-10-CM

## 2019-04-16 DIAGNOSIS — L02213 Cutaneous abscess of chest wall: Secondary | ICD-10-CM | POA: Diagnosis not present

## 2019-04-16 DIAGNOSIS — Z888 Allergy status to other drugs, medicaments and biological substances status: Secondary | ICD-10-CM | POA: Diagnosis not present

## 2019-04-16 DIAGNOSIS — K567 Ileus, unspecified: Secondary | ICD-10-CM | POA: Diagnosis not present

## 2019-04-16 DIAGNOSIS — N179 Acute kidney failure, unspecified: Secondary | ICD-10-CM | POA: Diagnosis present

## 2019-04-16 DIAGNOSIS — R7881 Bacteremia: Secondary | ICD-10-CM | POA: Diagnosis present

## 2019-04-16 DIAGNOSIS — Z22322 Carrier or suspected carrier of Methicillin resistant Staphylococcus aureus: Secondary | ICD-10-CM

## 2019-04-16 DIAGNOSIS — M25551 Pain in right hip: Secondary | ICD-10-CM | POA: Diagnosis not present

## 2019-04-16 DIAGNOSIS — I5030 Unspecified diastolic (congestive) heart failure: Secondary | ICD-10-CM | POA: Diagnosis not present

## 2019-04-16 DIAGNOSIS — E1322 Other specified diabetes mellitus with diabetic chronic kidney disease: Secondary | ICD-10-CM | POA: Diagnosis not present

## 2019-04-16 DIAGNOSIS — Z8249 Family history of ischemic heart disease and other diseases of the circulatory system: Secondary | ICD-10-CM

## 2019-04-16 DIAGNOSIS — Z992 Dependence on renal dialysis: Secondary | ICD-10-CM

## 2019-04-16 DIAGNOSIS — N186 End stage renal disease: Secondary | ICD-10-CM | POA: Diagnosis present

## 2019-04-16 DIAGNOSIS — I5032 Chronic diastolic (congestive) heart failure: Secondary | ICD-10-CM | POA: Diagnosis present

## 2019-04-16 DIAGNOSIS — E1365 Other specified diabetes mellitus with hyperglycemia: Secondary | ICD-10-CM | POA: Diagnosis not present

## 2019-04-16 DIAGNOSIS — Z515 Encounter for palliative care: Secondary | ICD-10-CM | POA: Diagnosis not present

## 2019-04-16 DIAGNOSIS — D62 Acute posthemorrhagic anemia: Secondary | ICD-10-CM | POA: Diagnosis not present

## 2019-04-16 DIAGNOSIS — G939 Disorder of brain, unspecified: Secondary | ICD-10-CM | POA: Diagnosis present

## 2019-04-16 DIAGNOSIS — E785 Hyperlipidemia, unspecified: Secondary | ICD-10-CM | POA: Diagnosis present

## 2019-04-16 DIAGNOSIS — I468 Cardiac arrest due to other underlying condition: Secondary | ICD-10-CM | POA: Diagnosis not present

## 2019-04-16 DIAGNOSIS — R509 Fever, unspecified: Secondary | ICD-10-CM

## 2019-04-16 DIAGNOSIS — Z86718 Personal history of other venous thrombosis and embolism: Secondary | ICD-10-CM

## 2019-04-16 DIAGNOSIS — N2581 Secondary hyperparathyroidism of renal origin: Secondary | ICD-10-CM | POA: Diagnosis present

## 2019-04-16 DIAGNOSIS — B9562 Methicillin resistant Staphylococcus aureus infection as the cause of diseases classified elsewhere: Secondary | ICD-10-CM | POA: Diagnosis present

## 2019-04-16 DIAGNOSIS — E872 Acidosis: Secondary | ICD-10-CM | POA: Diagnosis present

## 2019-04-16 DIAGNOSIS — R52 Pain, unspecified: Secondary | ICD-10-CM | POA: Diagnosis not present

## 2019-04-16 DIAGNOSIS — R4182 Altered mental status, unspecified: Secondary | ICD-10-CM | POA: Diagnosis not present

## 2019-04-16 DIAGNOSIS — Z8674 Personal history of sudden cardiac arrest: Secondary | ICD-10-CM | POA: Diagnosis not present

## 2019-04-16 DIAGNOSIS — M79609 Pain in unspecified limb: Secondary | ICD-10-CM | POA: Diagnosis not present

## 2019-04-16 DIAGNOSIS — R0902 Hypoxemia: Secondary | ICD-10-CM

## 2019-04-16 DIAGNOSIS — M868X8 Other osteomyelitis, other site: Secondary | ICD-10-CM | POA: Diagnosis present

## 2019-04-16 DIAGNOSIS — IMO0002 Reserved for concepts with insufficient information to code with codable children: Secondary | ICD-10-CM | POA: Diagnosis present

## 2019-04-16 DIAGNOSIS — I132 Hypertensive heart and chronic kidney disease with heart failure and with stage 5 chronic kidney disease, or end stage renal disease: Secondary | ICD-10-CM | POA: Diagnosis present

## 2019-04-16 DIAGNOSIS — N184 Chronic kidney disease, stage 4 (severe): Secondary | ICD-10-CM | POA: Diagnosis not present

## 2019-04-16 DIAGNOSIS — Z7901 Long term (current) use of anticoagulants: Secondary | ICD-10-CM | POA: Diagnosis not present

## 2019-04-16 DIAGNOSIS — M7989 Other specified soft tissue disorders: Secondary | ICD-10-CM | POA: Diagnosis not present

## 2019-04-16 DIAGNOSIS — Z452 Encounter for adjustment and management of vascular access device: Secondary | ICD-10-CM

## 2019-04-16 DIAGNOSIS — E875 Hyperkalemia: Secondary | ICD-10-CM | POA: Diagnosis not present

## 2019-04-16 DIAGNOSIS — M1A09X0 Idiopathic chronic gout, multiple sites, without tophus (tophi): Secondary | ICD-10-CM | POA: Diagnosis present

## 2019-04-16 DIAGNOSIS — M7070 Other bursitis of hip, unspecified hip: Secondary | ICD-10-CM

## 2019-04-16 DIAGNOSIS — Z4659 Encounter for fitting and adjustment of other gastrointestinal appliance and device: Secondary | ICD-10-CM

## 2019-04-16 DIAGNOSIS — R9089 Other abnormal findings on diagnostic imaging of central nervous system: Secondary | ICD-10-CM | POA: Diagnosis not present

## 2019-04-16 DIAGNOSIS — R197 Diarrhea, unspecified: Secondary | ICD-10-CM | POA: Diagnosis not present

## 2019-04-16 DIAGNOSIS — R001 Bradycardia, unspecified: Secondary | ICD-10-CM | POA: Diagnosis not present

## 2019-04-16 NOTE — Progress Notes (Signed)
Patient admitted to the unit via EMS from outside hospital. Patient screaming out in pain stating we want her to be in pain. Explained to patient that admitting MD has to be paged for pain medication and admitting order because we have none in the chart. Admitting team paged while in patients room. MD to come see patient and place orders. Patient arrived to unit soiled and needing to be cleaned up. Explained to patient that she needed to be clean up and her skin examined for wounds. Patient continually crying out during her bath stating the we are enjoying her being in pain and torturing her. Explained to patient multiple times that the MD is aware she is in pain but we need orders before we can give her medication. Patient oriented to unit, room, call bell, and telephone. Patient denied wanting [place] to call anyone about her arrival. Patient educated on calling before falling. VSS. Patient on 2l [place] and telemonitor placed.

## 2019-04-17 ENCOUNTER — Encounter (HOSPITAL_COMMUNITY): Payer: Self-pay | Admitting: Internal Medicine

## 2019-04-17 ENCOUNTER — Other Ambulatory Visit: Payer: Self-pay

## 2019-04-17 ENCOUNTER — Inpatient Hospital Stay (HOSPITAL_COMMUNITY): Payer: BLUE CROSS/BLUE SHIELD

## 2019-04-17 DIAGNOSIS — E1322 Other specified diabetes mellitus with diabetic chronic kidney disease: Secondary | ICD-10-CM

## 2019-04-17 DIAGNOSIS — E1365 Other specified diabetes mellitus with hyperglycemia: Secondary | ICD-10-CM

## 2019-04-17 DIAGNOSIS — B9562 Methicillin resistant Staphylococcus aureus infection as the cause of diseases classified elsewhere: Secondary | ICD-10-CM | POA: Diagnosis present

## 2019-04-17 DIAGNOSIS — N184 Chronic kidney disease, stage 4 (severe): Secondary | ICD-10-CM

## 2019-04-17 DIAGNOSIS — N186 End stage renal disease: Secondary | ICD-10-CM | POA: Diagnosis present

## 2019-04-17 DIAGNOSIS — R7881 Bacteremia: Principal | ICD-10-CM | POA: Diagnosis present

## 2019-04-17 LAB — VANCOMYCIN, RANDOM: Vancomycin Rm: 10

## 2019-04-17 LAB — HEPARIN LEVEL (UNFRACTIONATED): Heparin Unfractionated: 1.54 IU/mL — ABNORMAL HIGH (ref 0.30–0.70)

## 2019-04-17 LAB — COMPREHENSIVE METABOLIC PANEL
ALT: 15 U/L (ref 0–44)
AST: 16 U/L (ref 15–41)
Albumin: 1.7 g/dL — ABNORMAL LOW (ref 3.5–5.0)
Alkaline Phosphatase: 145 U/L — ABNORMAL HIGH (ref 38–126)
Anion gap: 13 (ref 5–15)
BUN: 38 mg/dL — ABNORMAL HIGH (ref 6–20)
CO2: 27 mmol/L (ref 22–32)
Calcium: 8.7 mg/dL — ABNORMAL LOW (ref 8.9–10.3)
Chloride: 94 mmol/L — ABNORMAL LOW (ref 98–111)
Creatinine, Ser: 5.45 mg/dL — ABNORMAL HIGH (ref 0.44–1.00)
GFR calc Af Amer: 11 mL/min — ABNORMAL LOW (ref >60)
GFR calc non Af Amer: 9 mL/min — ABNORMAL LOW (ref >60)
Glucose, Bld: 92 mg/dL (ref 70–99)
Potassium: 3.7 mmol/L (ref 3.5–5.1)
Sodium: 134 mmol/L — ABNORMAL LOW (ref 135–145)
Total Bilirubin: 3.7 mg/dL — ABNORMAL HIGH (ref 0.3–1.2)
Total Protein: 6.3 g/dL — ABNORMAL LOW (ref 6.5–8.1)

## 2019-04-17 LAB — CBC WITH DIFFERENTIAL/PLATELET
Abs Immature Granulocytes: 0.31 10*3/uL — ABNORMAL HIGH (ref 0.00–0.07)
Basophils Absolute: 0 10*3/uL (ref 0.0–0.1)
Basophils Relative: 0 %
Eosinophils Absolute: 0.4 10*3/uL (ref 0.0–0.5)
Eosinophils Relative: 5 %
HCT: 33.1 % — ABNORMAL LOW (ref 36.0–46.0)
Hemoglobin: 10.5 g/dL — ABNORMAL LOW (ref 12.0–15.0)
Immature Granulocytes: 3 %
Lymphocytes Relative: 11 %
Lymphs Abs: 1 10*3/uL (ref 0.7–4.0)
MCH: 30 pg (ref 26.0–34.0)
MCHC: 31.7 g/dL (ref 30.0–36.0)
MCV: 94.6 fL (ref 80.0–100.0)
Monocytes Absolute: 0.6 10*3/uL (ref 0.1–1.0)
Monocytes Relative: 6 %
Neutro Abs: 7.2 10*3/uL (ref 1.7–7.7)
Neutrophils Relative %: 75 %
Platelets: 232 10*3/uL (ref 150–400)
RBC: 3.5 MIL/uL — ABNORMAL LOW (ref 3.87–5.11)
RDW: 15.9 % — ABNORMAL HIGH (ref 11.5–15.5)
Smear Review: ADEQUATE
WBC: 9.5 10*3/uL (ref 4.0–10.5)
nRBC: 0 % (ref 0.0–0.2)

## 2019-04-17 LAB — GLUCOSE, CAPILLARY
Glucose-Capillary: 80 mg/dL (ref 70–99)
Glucose-Capillary: 87 mg/dL (ref 70–99)
Glucose-Capillary: 93 mg/dL (ref 70–99)
Glucose-Capillary: 94 mg/dL (ref 70–99)
Glucose-Capillary: 119 mg/dL — ABNORMAL HIGH (ref 70–99)

## 2019-04-17 LAB — HEMOGLOBIN A1C
Hgb A1c MFr Bld: 6.9 % — ABNORMAL HIGH (ref 4.8–5.6)
Mean Plasma Glucose: 151.33 mg/dL

## 2019-04-17 LAB — SEDIMENTATION RATE: Sed Rate: 101 mm/hr — ABNORMAL HIGH (ref 0–22)

## 2019-04-17 LAB — PHOSPHORUS: Phosphorus: 5.4 mg/dL — ABNORMAL HIGH (ref 2.5–4.6)

## 2019-04-17 LAB — APTT: aPTT: 44 seconds — ABNORMAL HIGH (ref 24–36)

## 2019-04-17 LAB — SARS CORONAVIRUS 2 (TAT 6-24 HRS): SARS Coronavirus 2: NEGATIVE

## 2019-04-17 LAB — HCG, SERUM, QUALITATIVE: Preg, Serum: NEGATIVE

## 2019-04-17 LAB — MAGNESIUM: Magnesium: 2 mg/dL (ref 1.7–2.4)

## 2019-04-17 LAB — TSH: TSH: 3.334 u[IU]/mL (ref 0.350–4.500)

## 2019-04-17 MED ORDER — CHLORHEXIDINE GLUCONATE CLOTH 2 % EX PADS
6.0000 | MEDICATED_PAD | Freq: Every day | CUTANEOUS | Status: DC
  Administered 2019-04-17 – 2019-04-28 (×8): 6 via TOPICAL

## 2019-04-17 MED ORDER — INSULIN ASPART 100 UNIT/ML ~~LOC~~ SOLN
0.0000 [IU] | Freq: Three times a day (TID) | SUBCUTANEOUS | Status: DC
  Administered 2019-04-18 – 2019-04-22 (×4): 1 [IU] via SUBCUTANEOUS
  Administered 2019-04-23: 17:00:00 2 [IU] via SUBCUTANEOUS

## 2019-04-17 MED ORDER — ONDANSETRON HCL 4 MG/2ML IJ SOLN: 4.0000 mg | Freq: Four times a day (QID) | INTRAMUSCULAR | Status: DC | PRN

## 2019-04-17 MED ORDER — ONDANSETRON HCL 4 MG PO TABS: 4.0000 mg | ORAL_TABLET | Freq: Four times a day (QID) | ORAL | Status: DC | PRN

## 2019-04-17 MED ORDER — HEPARIN (PORCINE) 25000 UT/250ML-% IV SOLN
1500.0000 [IU]/h | INTRAVENOUS | Status: DC
  Administered 2019-04-17: 06:00:00 1300 [IU]/h via INTRAVENOUS
  Administered 2019-04-18: 1700 [IU]/h via INTRAVENOUS
  Administered 2019-04-18: 1500 [IU]/h via INTRAVENOUS
  Administered 2019-04-19 – 2019-04-20 (×3): 1800 [IU]/h via INTRAVENOUS
  Administered 2019-04-20: 1700 [IU]/h via INTRAVENOUS
  Administered 2019-04-21: 1500 [IU]/h via INTRAVENOUS
  Filled 2019-04-17 (×8): qty 250

## 2019-04-17 MED ORDER — ROSUVASTATIN CALCIUM 20 MG PO TABS
40.0000 mg | ORAL_TABLET | Freq: Every day | ORAL | Status: DC
  Administered 2019-04-17 – 2019-04-26 (×10): 40 mg via ORAL
  Filled 2019-04-17 (×2): qty 2
  Filled 2019-04-17: qty 8
  Filled 2019-04-17 (×5): qty 2
  Filled 2019-04-17: qty 8
  Filled 2019-04-17 (×2): qty 2
  Filled 2019-04-17 (×3): qty 8
  Filled 2019-04-17: qty 2

## 2019-04-17 MED ORDER — INSULIN ASPART 100 UNIT/ML ~~LOC~~ SOLN: 0.0000 [IU] | SUBCUTANEOUS | Status: DC

## 2019-04-17 MED ORDER — ACETAMINOPHEN 325 MG PO TABS
650.0000 mg | ORAL_TABLET | Freq: Four times a day (QID) | ORAL | Status: DC | PRN
  Administered 2019-04-17 – 2019-04-19 (×3): 650 mg via ORAL
  Filled 2019-04-17 (×4): qty 2

## 2019-04-17 MED ORDER — SODIUM CHLORIDE 0.9% FLUSH: 10.0000 mL | INTRAVENOUS | Status: DC | PRN

## 2019-04-17 MED ORDER — ALLOPURINOL 100 MG PO TABS
100.0000 mg | ORAL_TABLET | Freq: Two times a day (BID) | ORAL | Status: DC
  Administered 2019-04-17 – 2019-04-26 (×18): 100 mg via ORAL
  Filled 2019-04-17 (×18): qty 1

## 2019-04-17 MED ORDER — TRAMADOL HCL 50 MG PO TABS
50.0000 mg | ORAL_TABLET | Freq: Four times a day (QID) | ORAL | Status: DC | PRN
  Administered 2019-04-18 – 2019-04-24 (×4): 50 mg via ORAL
  Filled 2019-04-17 (×4): qty 1

## 2019-04-17 MED ORDER — ACETAMINOPHEN 650 MG RE SUPP: 650.0000 mg | Freq: Four times a day (QID) | RECTAL | Status: DC | PRN

## 2019-04-17 MED ORDER — HEPARIN SODIUM (PORCINE) 5000 UNIT/ML IJ SOLN: 5000.0000 [IU] | Freq: Three times a day (TID) | INTRAMUSCULAR | Status: DC

## 2019-04-17 MED ORDER — CLONIDINE HCL 0.2 MG PO TABS
0.2000 mg | ORAL_TABLET | Freq: Every day | ORAL | Status: DC
  Administered 2019-04-17: 10:00:00 0.2 mg via ORAL
  Filled 2019-04-17 (×2): qty 1

## 2019-04-17 MED ORDER — ASPIRIN EC 81 MG PO TBEC
81.0000 mg | DELAYED_RELEASE_TABLET | Freq: Every day | ORAL | Status: DC
  Administered 2019-04-17 – 2019-04-23 (×5): 81 mg via ORAL
  Filled 2019-04-17 (×6): qty 1

## 2019-04-17 MED ORDER — CALCITRIOL 0.25 MCG PO CAPS
0.5000 ug | ORAL_CAPSULE | ORAL | Status: DC
  Administered 2019-04-23: 0.5 ug via ORAL
  Filled 2019-04-17 (×2): qty 2

## 2019-04-17 MED ORDER — RENA-VITE PO TABS
1.0000 | ORAL_TABLET | Freq: Every day | ORAL | Status: DC
  Administered 2019-04-17 – 2019-04-30 (×13): 1 via ORAL
  Filled 2019-04-17 (×14): qty 1

## 2019-04-17 MED ORDER — VANCOMYCIN HCL IN DEXTROSE 1-5 GM/200ML-% IV SOLN
1000.0000 mg | INTRAVENOUS | Status: DC
  Filled 2019-04-17: qty 200

## 2019-04-17 MED ORDER — HYDRALAZINE HCL 25 MG PO TABS
25.0000 mg | ORAL_TABLET | Freq: Three times a day (TID) | ORAL | Status: DC
  Filled 2019-04-17 (×4): qty 1

## 2019-04-17 MED ORDER — MORPHINE SULFATE (PF) 2 MG/ML IV SOLN
0.5000 mg | INTRAVENOUS | Status: DC | PRN
  Administered 2019-04-17 – 2019-04-18 (×2): 1 mg via INTRAVENOUS
  Administered 2019-04-18: 0.5 mg via INTRAVENOUS
  Filled 2019-04-17 (×3): qty 1

## 2019-04-17 MED ORDER — PRO-STAT SUGAR FREE PO LIQD
30.0000 mL | Freq: Two times a day (BID) | ORAL | Status: DC
  Administered 2019-04-17 – 2019-04-19 (×2): 30 mL via ORAL
  Filled 2019-04-17 (×5): qty 30

## 2019-04-17 MED ORDER — INSULIN ASPART 100 UNIT/ML ~~LOC~~ SOLN: 0.0000 [IU] | Freq: Three times a day (TID) | SUBCUTANEOUS | Status: DC

## 2019-04-17 MED ORDER — CARVEDILOL 25 MG PO TABS
25.0000 mg | ORAL_TABLET | Freq: Two times a day (BID) | ORAL | Status: DC
  Administered 2019-04-17 – 2019-04-18 (×3): 25 mg via ORAL
  Filled 2019-04-17 (×4): qty 1

## 2019-04-17 MED ORDER — VANCOMYCIN HCL IN DEXTROSE 1-5 GM/200ML-% IV SOLN
1000.0000 mg | Freq: Once | INTRAVENOUS | Status: AC
  Administered 2019-04-17: 06:00:00 1000 mg via INTRAVENOUS
  Filled 2019-04-17 (×2): qty 200

## 2019-04-17 MED ORDER — HYDRALAZINE HCL 50 MG PO TABS
50.0000 mg | ORAL_TABLET | Freq: Three times a day (TID) | ORAL | Status: DC
  Administered 2019-04-17 (×2): 50 mg via ORAL
  Filled 2019-04-17 (×2): qty 1

## 2019-04-17 NOTE — Progress Notes (Signed)
    [facility] has been requested to perform a transesophageal echocardiogram tentatively planned for Monday 04/20/2019 for bacteremia.  After careful review of history and examination, the risks and benefits of transesophageal echocardiogram have been explained including risks of esophageal damage, perforation (1:10,000 risk), bleeding, pharyngeal hematoma as well as other potential complications associated with conscious sedation including aspiration, arrhythmia, respiratory failure and death. Alternatives to treatment were discussed, questions were answered. Patient is willing to proceed.   TEE timing TBD. Tentatively planning for Monday 04/20/2019, therefore will keep NPO after MN on Sunday.  Roby Lofts, PA-C 04/17/2019 10:54 AM

## 2019-04-17 NOTE — Progress Notes (Signed)
[place] for Heparin Indication: DVT  Allergies  Allergen Reactions  . Contrast Media [Iodinated Diagnostic Agents] Other (See Comments)    Affected her kidneys  . Pepto-Bismol [Bismuth] Other (See Comments)    Unknown reaction    Patient Measurements: Height: 5\' 2"  (157.5 cm) Weight: 248 lb 3.8 oz (112.6 kg) IBW/kg (Calculated) : 50.1 Heparin Dosing Weight: 80 kg  Vital Signs: Temp: 98.2 F (36.8 C) (10/24 2028) Temp Source: Oral (10/24 1145) BP: 93/58 (10/24 2028) Pulse Rate: 80 (10/24 2028)  Labs: Recent Labs    04/17/19 0320 04/17/19 2006  HGB 10.5*  --   HCT 33.1*  --   PLT 232  --   APTT  --  44*  HEPARINUNFRC  --  1.54*  CREATININE 5.45*  --     Estimated Creatinine Clearance: 16.9 mL/min (A) (by C-G formula based on SCr of 5.45 mg/dL (H)).   Medical History: Past Medical History:  Diagnosis Date  . Anemia    2019  . Benign essential HTN   . CHF (congestive heart failure) ([place])   . CKD (chronic kidney disease) stage 3, GFR 30-59 ml/min   . Diabetes ([place])     Medications:  Medications Prior to Admission  Medication Sig Dispense Refill Last Dose  . albuterol (PROAIR HFA) 108 (90 Base) MCG/ACT inhaler Inhale 2 puffs into the lungs every 4 (four) hours as needed for wheezing or shortness of breath.     . allopurinol (ZYLOPRIM) 100 MG tablet TAKE 1 TABLET BY MOUTH TWICE A DAY (Patient taking differently: Take 100 mg by mouth 2 (two) times daily. ) 60 tablet 6   . aspirin EC 81 MG EC tablet Take 1 tablet (81 mg total) by mouth daily. 30 tablet 0   . calcitRIOL (ROCALTROL) 0.25 MCG capsule Take 1 capsule (0.25 mcg total) by mouth every Monday, Wednesday, and Friday. 30 capsule 0   . carvedilol (COREG) 25 MG tablet Take 1 tablet (25 mg total) by mouth 2 (two) times daily with a meal. 60 tablet 0   . cloNIDine (CATAPRES) 0.2 MG tablet Take 0.2 mg by mouth at bedtime.     . ferric citrate (AURYXIA) 1 GM 210 MG(Fe) tablet  Take 210 mg by mouth 3 (three) times daily with meals.     . gabapentin (NEURONTIN) 300 MG capsule Take 300 mg by mouth every 8 (eight) hours.     . hydrALAZINE (APRESOLINE) 50 MG tablet Take 1 tablet (50 mg total) by mouth 3 (three) times daily. 90 tablet 1   . Insulin Glargine (LANTUS SOLOSTAR) 100 UNIT/ML Solostar Pen Inject 20 Units into the skin daily.     . insulin lispro (HUMALOG KWIKPEN) 100 UNIT/ML KwikPen Inject 10 Units into the skin 3 (three) times daily.     Marland Kitchen levofloxacin (LEVAQUIN) 750 MG tablet Take 750 mg by mouth every other day.     . metolazone (ZAROXOLYN) 2.5 MG tablet Take 2.5 mg by mouth once a week.     Marland Kitchen oxyCODONE-acetaminophen (PERCOCET/ROXICET) 5-325 MG tablet Take 1 tablet by mouth every 4 (four) hours as needed (pain).      . rosuvastatin (CRESTOR) 40 MG tablet Take 40 mg by mouth at bedtime.  1   . torsemide (DEMADEX) 100 MG tablet Take 1 tablet (100 mg total) by mouth 2 (two) times daily. 60 tablet 0     Assessment: 38 y.o. female with recent DVT treated with Xarelto (last dose at 7 am  10/23) on heparin -heparin level= 1.54 (due to recent xarelto) -aPTT= 44  Goal of Therapy:  APTT 66-102 sec Heparin level 0.3-0.7 units/ml Monitor platelets by anticoagulation protocol: Yes   Plan:  -Increase heparin to 1500 units/hr -aPTT in 8 hours and daily wth CBC daily  Hildred Laser, PharmD Clinical Pharmacist **Pharmacist phone directory can now be found on [email or web address] ([email or web address]).  Listed under [place].   e

## 2019-04-17 NOTE — Progress Notes (Signed)
This note also relates to the following rows which could not be included: MAP (mmHg) - Cannot attach notes to unvalidated device data    04/17/19 0748 04/17/19 0900  Vitals  Temp 97.8 F (36.6 C)  --   Temp Source Axillary  --   BP (!) 99/49 (!) 101/58  MAP (mmHg) 65  --   BP Location Right Arm  --   BP Method Automatic  --   Patient Position (if appropriate) Lying  --   Pulse Rate 81  --   Pulse Rate Source Monitor  --   ECG Heart Rate 82  --   Resp 13  --   Oxygen Therapy  SpO2 99 %  --   O2 Device Room Air  --   MEWS Score  MEWS RR 1  --   MEWS Pulse 0  --   MEWS Systolic 1  --   MEWS LOC 0  --   MEWS Temp 0  --   MEWS Score 2  --   MEWS Score Color Yellow  --   MEWS Assessment  Is this an acute change? No  --    Pt. SBP in high 90s. Pt. Seen with no signs or symptoms of hypotension. Pt. C/o pain. MD notified no new orders received. BP was taken again WDL.

## 2019-04-17 NOTE — H&P (Signed)
History and Physical    Carla Compton [id number] DOB: [birth year] DOA: 04/15/2019  PCP: Abigail Miyamoto, FNP  Patient coming from: Patient was transferred from [facility].  Chief Complaint: Persistent bacteremia.  HPI: Carla Compton is a 38 y.o. female with history of ESRD recently started on dialysis in September 2020 recently diagnosed with right lower extremity DVT was on Xarelto, diabetes mellitus type 2, hypertension, diastolic CHF anemia has been admitted at [facility] for septic shock on April 12, 2019 initially was on Levophed and was gradually weaned off.  The patient was found to have MRSA bacteremia in 4 out of the 4 bottles.  Initially was treated with vancomycin but was changed to Zyvox and meropenem.  Per report 2D echo did not show any vegetation.  Patient also had brief episodes of confusion and CT head was done twice which was not showing anything acute.  Also per report per the discharge summary CT lumbar spine CT chest did not show any acute.  Ultrasound of the lower extremity on the right side showed some fluid collection concerning for cellulitis.  No abscess.  TEE was considered but since patient was agitated and confused was unable to be performed.  During the stay in the hospital patient also had neurology consult for acute encephalopathy and at this time it was felt that patient sepsis likely contributed to it.  Since patient has persistent bacteremia and will need further management including TEE patient was transferred to [facility] after discussing with Dr. Graylon Good infectious disease consultant.  Patient initially talked well with me on exam and has been having brief episodes of confusion while in the hospital..  Patient is presently afebrile chest x-ray shows some nonspecific findings.  Labs on April 15, 2019 at [facility] was showing sodium of 134 potassium 4 chloride 94, dioxide 27 creatinine 5.36 AST 17 ALT 27  Albumin 2.3 WBC 3.3 hemoglobin 9.9 platelets 215 per report COVID-19 was negative.  ED Course: Patient was a direct admit.  Review of Systems: As per HPI, rest all negative.   Past Medical History:  Diagnosis Date   Anemia    2019   Benign essential HTN    CHF (congestive heart failure) (HCC)    CKD (chronic kidney disease) stage 3, GFR 30-59 ml/min    Diabetes ([place])     Past Surgical History:  Procedure Laterality Date   BREAST SURGERY     reduction, 2005   RENAL BIOPSY  2018     reports that she has never smoked. She has never used smokeless tobacco. She reports that she does not drink alcohol or use drugs.  Allergies  Allergen Reactions   Contrast Media [Iodinated Diagnostic Agents]     Family History  Problem Relation Age of Onset   Mitral valve prolapse Mother    Hypertension Mother    Hypertension Father    Diabetes Father    Diabetes Maternal Grandmother     Prior to Admission medications   Medication Sig Start Date End Date Taking? Authorizing Provider  albuterol (PROAIR HFA) 108 (90 Base) MCG/ACT inhaler Inhale 2 puffs into the lungs every 4 (four) hours as needed for wheezing or shortness of breath.    [provider]  allopurinol (ZYLOPRIM) 100 MG tablet TAKE 1 TABLET BY MOUTH TWICE A DAY 04/12/19   Newt Minion, MD  aspirin EC 81 MG EC tablet Take 1 tablet (81 mg total) by mouth daily. 06/15/18   Kayleen Memos,  DO  calcitRIOL (ROCALTROL) 0.25 MCG capsule Take 1 capsule (0.25 mcg total) by mouth every Monday, Wednesday, and Friday. 08/17/18   Bonnell Public, MD  carvedilol (COREG) 25 MG tablet Take 1 tablet (25 mg total) by mouth 2 (two) times daily with a meal. 06/14/18   Kayleen Memos, DO  hydrALAZINE (APRESOLINE) 50 MG tablet Take 1 tablet (50 mg total) by mouth 3 (three) times daily. 06/23/18   Kroeger, Lorelee Cover., PA-C  rosuvastatin (CRESTOR) 40 MG tablet Take 40 mg by mouth at bedtime. 03/23/18   [provider]   torsemide (DEMADEX) 100 MG tablet Take 1 tablet (100 mg total) by mouth 2 (two) times daily. 08/16/18   Bonnell Public, MD    Physical Exam: Constitutional: Moderately built and nourished. Vitals:   04/08/2019 2345  BP: 130/74  Pulse: 82  Resp: (!) 24  Temp: 98 F (36.7 C)  TempSrc: Oral  SpO2: 100%  Weight: 111.6 kg  Height: 5\' 2"  (1.575 m)   Eyes: Anicteric no pallor present ENMT: No discharge from the ears eyes nose or mouth. Neck: No mass felt.  No neck rigidity.  Right IJ present. Respiratory: No rhonchi or crepitations. Cardiovascular: S1-S2 heard. Abdomen: Soft nontender bowel sounds present. Musculoskeletal: Mild swelling of the both lower extremities. Skin: Chronic skin changes. Neurologic: Patient at this time is talking well but has had.'s of confusion noted by nurse.  Moves all extremities. Psychiatric: Oriented to name and place.   Labs on Admission: I have personally reviewed following labs and imaging studies  CBC: No results for input(s): WBC, NEUTROABS, HGB, HCT, MCV, PLT in the last 168 hours. Basic Metabolic Panel: No results for input(s): NA, K, CL, CO2, GLUCOSE, BUN, CREATININE, CALCIUM, MG, PHOS in the last 168 hours. GFR: CrCl cannot be calculated (Patient's most recent lab result is older than the maximum 21 days allowed.). Liver Function Tests: No results for input(s): AST, ALT, ALKPHOS, BILITOT, PROT, ALBUMIN in the last 168 hours. No results for input(s): LIPASE, AMYLASE in the last 168 hours. No results for input(s): AMMONIA in the last 168 hours. Coagulation Profile: No results for input(s): INR, PROTIME in the last 168 hours. Cardiac Enzymes: No results for input(s): CKTOTAL, CKMB, CKMBINDEX, TROPONINI in the last 168 hours. BNP (last 3 results) No results for input(s): PROBNP in the last 8760 hours. HbA1C: No results for input(s): HGBA1C in the last 72 hours. CBG: No results for input(s): GLUCAP in the last 168 hours. Lipid  Profile: No results for input(s): CHOL, HDL, LDLCALC, TRIG, CHOLHDL, LDLDIRECT in the last 72 hours. Thyroid Function Tests: No results for input(s): TSH, T4TOTAL, FREET4, T3FREE, THYROIDAB in the last 72 hours. Anemia Panel: No results for input(s): VITAMINB12, FOLATE, FERRITIN, TIBC, IRON, RETICCTPCT in the last 72 hours. Urine analysis: No results found for: COLORURINE, APPEARANCEUR, LABSPEC, PHURINE, GLUCOSEU, HGBUR, BILIRUBINUR, KETONESUR, PROTEINUR, UROBILINOGEN, NITRITE, LEUKOCYTESUR Sepsis Labs: @LABRCNTIP (procalcitonin:4,lacticidven:4) )No results found for this or any previous visit (from the past 240 hour(s)).   Radiological Exams on Admission: No results found.   Assessment/Plan Principal Problem:   MRSA bacteremia Active Problems:   Uncontrolled secondary diabetes mellitus with stage 4 CKD (GFR 15-29) (HCC)   Normocytic anemia   Idiopathic chronic gout of multiple sites without tophus   ESRD (end stage renal disease) (HCC)   Bacteremia    1. MRSA bacteremia with unknown source parent for now asked to place patient on empirically vancomycin.  Repeat blood cultures have been ordered.  Per  report 2D echo did not show any vegetations.  Will need TEE.  Consult infectious disease in the morning. 2. ESRD on hemodialysis please consult nephrology for dialysis treatment 3. Hypertension on Coreg hydralazine and clonidine. 4. .  Some confusion cause unclear CT head was unremarkable however patient also has been having recurrent symptoms over a year.  Will get a CT head and EEG.  Neurologist at [facility] felt that patient may be having embolic showering. 5. History of GERD. 6. Recently diagnosed right lower extremity DVT discussed with pharmacy since patient is on dialysis will hold off Xarelto and keep patient on heparin. 7. Anemia secondary to ESRD follow CBC. 8. Diabetes mellitus type 2 we will keep patient on sliding scale coverage.  Since patient has bacteremia and will  need more than 2 midnight stay will need inpatient status.  Patient has a right IJ placed at [facility].   DVT prophylaxis: Heparin infusion. Code Status: Full code. Family Communication: I try to reach patient's mom unable to reach. Disposition Plan: To be determined. Consults called: None. Admission status: Inpatient.   Rise Patience MD [facility] Pager [phone].  If 7PM-7AM, please contact night-coverage [email or web address] Password [email or web address]  04/17/2019, 12:25 AM

## 2019-04-17 NOTE — Progress Notes (Signed)
ANTICOAGULATION CONSULT NOTE - Initial Consult  Pharmacy Consult for Heparin Indication: DVT  Allergies  Allergen Reactions  . Contrast Media [Iodinated Diagnostic Agents]     Patient Measurements: Height: 5\' 2"  (157.5 cm) Weight: 246 lb 0.5 oz (111.6 kg) IBW/kg (Calculated) : 50.1 Heparin Dosing Weight: 80 kg  Vital Signs: Temp: 98 F (36.7 C) (10/23 2345) Temp Source: Oral (10/23 2345) BP: 130/74 (10/23 2345) Pulse Rate: 82 (10/23 2345)  Labs: Recent Labs    04/17/19 0320  HGB 10.5*  HCT 33.1*  PLT 232  CREATININE 5.45*    Estimated Creatinine Clearance: 16.8 mL/min (A) (by C-G formula based on SCr of 5.45 mg/dL (H)).   Medical History: Past Medical History:  Diagnosis Date  . Anemia    2019  . Benign essential HTN   . CHF (congestive heart failure) ([place])   . CKD (chronic kidney disease) stage 3, GFR 30-59 ml/min   . Diabetes ([place])     Medications:  Medications Prior to Admission  Medication Sig Dispense Refill Last Dose  . albuterol (PROAIR HFA) 108 (90 Base) MCG/ACT inhaler Inhale 2 puffs into the lungs every 4 (four) hours as needed for wheezing or shortness of breath.     . allopurinol (ZYLOPRIM) 100 MG tablet TAKE 1 TABLET BY MOUTH TWICE A DAY 60 tablet 6   . aspirin EC 81 MG EC tablet Take 1 tablet (81 mg total) by mouth daily. 30 tablet 0   . calcitRIOL (ROCALTROL) 0.25 MCG capsule Take 1 capsule (0.25 mcg total) by mouth every Monday, Wednesday, and Friday. 30 capsule 0   . carvedilol (COREG) 25 MG tablet Take 1 tablet (25 mg total) by mouth 2 (two) times daily with a meal. 60 tablet 0   . hydrALAZINE (APRESOLINE) 50 MG tablet Take 1 tablet (50 mg total) by mouth 3 (three) times daily. 90 tablet 1   . rosuvastatin (CRESTOR) 40 MG tablet Take 40 mg by mouth at bedtime.  1   . torsemide (DEMADEX) 100 MG tablet Take 1 tablet (100 mg total) by mouth 2 (two) times daily. 60 tablet 0     Assessment: 38 y.o. female with recent DVT treated with Xarelto  (last dose at 7 am 10/23) for heparin  Goal of Therapy:  APTT 66-102 sec Heparin level 0.3-0.7 units/ml Monitor platelets by anticoagulation protocol: Yes   Plan:  Start heparin 1300 units/hr APTT in 8 hours  Zygmunt Mcglinn, Bronson Curb 04/17/2019,4:43 AM

## 2019-04-17 NOTE — Consult Note (Addendum)
[facility] Renal Consultation Note    Indication for Consultation:  Management of ESRD/hemodialysis; anemia, hypertension/volume and secondary hyperparathyroidism Primary Nephrologist - Ernesto Rutherford, MD   HPI: Markeeta Holtzer is a 38 y.o. female with ESRD due to biopsy proven diabetic nephropathy on HD since March 2020 on at [facility] schedule at [facility].  She was transferred to [facility] from [facility] in [place], [place] 10/23 where she was admitted 04/11/19 with newly diagnoses right LE DVTand MRSA bacteremia.   She briefly required Levophed for hypotension.  Review of labs at [facility] showed ^ Total bili to 3.6 but normal AST/ALT. 2 D Echo did not show any vegetation but she was too agitated and confused to undergo TEE.  Head CT, chest CT lumbar and thorac CT were negative for acute findings. NM perfusion lung scan was negative for PE.  She was treated initially with Vancomycin then changed to Zyvox and meropenem and had persistent bacteremia.  PMHx is also significant for morbid obesity, HTN, hyperlipidemia, gout, GERD, hx breast reduction,CHF and rheumatoid arthritis.  She previously had been on Humira but this was stopped due to the thought it was worsening her CHF and she has been awaiting a follow up appt with rheumatology.  She most recently had an intervention at an access center in [place]. about a week ago prior to acute admission. She had been having problems with what sounds like her lines clotting off during dialysis in spite of being on heparin during her dialysis treatments.  At present she is taking no meds for RA. She has right thigh pain and some neck pain.  She is not SOB. She has no N, V, D, fever, chills, cough, dysuria or urgency. She has know know wounds except she thinks she might be getting a bed sore.Marland Kitchen Her COVID test was negative at OSH.  She last dialyzed Friday. She doesn't know her EDW but post HD wts run about 108 - 109 kg.  Initial labs at  [facility] (10/24--had HD 10/23) : Na 134 K 3.7 CO2 27 glu 92 BUN 38 Cr 5.45 Ca 8,.7 P 5.4 alb 1.7 Mg 2 T bili 3.7, AST 16 and ALT 15 ask phos 145 hgb 10.5 WBC 9.5 plts 232 Sed rate 101 TSH 3.3 Hgb A1c 6.9 BC ordered - don't see any pending.  Chest CXR mild vascular congestion - scattered nodularity - congestion vs atypical infection. Head CT  - chronic pan sinus inflammation - no acute findings.  Past Medical History:  Diagnosis Date  . Anemia    2019  . Benign essential HTN   . CHF (congestive heart failure) ([place])   . CKD (chronic kidney disease) stage 3, GFR 30-59 ml/min   . Diabetes [facility])    Past Surgical History:  Procedure Laterality Date  . BREAST SURGERY     reduction, 2005  . RENAL BIOPSY  2018   Family History  Problem Relation Age of Onset  . Mitral valve prolapse Mother   . Hypertension Mother   . Hypertension Father   . Diabetes Father   . Diabetes Maternal Grandmother    Social History:  reports that she has never smoked. She has never used smokeless tobacco. She reports that she does not drink alcohol or use drugs. Allergies  Allergen Reactions  . Contrast Media [Iodinated Diagnostic Agents]    Prior to Admission medications   Medication Sig Start Date End Date Taking? Authorizing Provider  albuterol (PROAIR HFA) 108 (90 Base) MCG/ACT  inhaler Inhale 2 puffs into the lungs every 4 (four) hours as needed for wheezing or shortness of breath.    [provider]  allopurinol (ZYLOPRIM) 100 MG tablet TAKE 1 TABLET BY MOUTH TWICE A DAY 04/12/19   Newt Minion, MD  aspirin EC 81 MG EC tablet Take 1 tablet (81 mg total) by mouth daily. 06/15/18   Kayleen Memos, DO  calcitRIOL (ROCALTROL) 0.25 MCG capsule Take 1 capsule (0.25 mcg total) by mouth every Monday, Wednesday, and Friday. 08/17/18   Bonnell Public, MD  carvedilol (COREG) 25 MG tablet Take 1 tablet (25 mg total) by mouth 2 (two) times daily with a meal. 06/14/18   Kayleen Memos, DO  hydrALAZINE  (APRESOLINE) 50 MG tablet Take 1 tablet (50 mg total) by mouth 3 (three) times daily. 06/23/18   Kroeger, Lorelee Cover., PA-C  rosuvastatin (CRESTOR) 40 MG tablet Take 40 mg by mouth at bedtime. 03/23/18   [provider]  torsemide (DEMADEX) 100 MG tablet Take 1 tablet (100 mg total) by mouth 2 (two) times daily. 08/16/18   Bonnell Public, MD   Current Facility-Administered Medications  Medication Dose Route Frequency Provider Last Rate Last Dose  . acetaminophen (TYLENOL) tablet 650 mg  650 mg Oral Q6H PRN Rise Patience, MD   650 mg at 04/17/19 [id number]   Or  . acetaminophen (TYLENOL) suppository 650 mg  650 mg Rectal Q6H PRN Rise Patience, MD      . allopurinol (ZYLOPRIM) tablet 100 mg  100 mg Oral BID Rise Patience, MD   100 mg at 04/17/19 [id number]  . aspirin EC tablet 81 mg  81 mg Oral Daily Rise Patience, MD   81 mg at 04/17/19 [id number]  . carvedilol (COREG) tablet 25 mg  25 mg Oral BID WC Rise Patience, MD   25 mg at 04/17/19 0904  . Chlorhexidine Gluconate Cloth 2 % PADS 6 each  6 each Topical Daily Rise Patience, MD      . cloNIDine (CATAPRES) tablet 0.2 mg  0.2 mg Oral Daily Rise Patience, MD   0.2 mg at 04/17/19 1012  . heparin ADULT infusion 100 units/mL (25000 units/222mL sodium chloride 0.45%)  1,300 Units/hr Intravenous Continuous Rise Patience, MD 13 mL/hr at 04/17/19 0600 1,300 Units/hr at 04/17/19 0600  . hydrALAZINE (APRESOLINE) tablet 50 mg  50 mg Oral Q8H Rise Patience, MD   50 mg at 04/17/19 0556  . insulin aspart (novoLOG) injection 0-9 Units  0-9 Units Subcutaneous Q4H Rise Patience, MD      . morphine 2 MG/ML injection 0.5-1 mg  0.5-1 mg Intravenous Q4H PRN Hosie Poisson, MD   1 mg at 04/17/19 1226  . ondansetron (ZOFRAN) tablet 4 mg  4 mg Oral Q6H PRN Rise Patience, MD       Or  . ondansetron [facility]) injection 4 mg  4 mg Intravenous Q6H PRN Rise Patience, MD      . rosuvastatin (CRESTOR)  tablet 40 mg  40 mg Oral QHS Rise Patience, MD   40 mg at 04/17/19 0153  . sodium chloride flush (NS) 0.9 % injection 10-40 mL  10-40 mL Intracatheter PRN Rise Patience, MD      . traMADol Veatrice Bourbon) tablet 50 mg  50 mg Oral Q6H PRN Hosie Poisson, MD      . Derrill Memo ON 04/10/2019] vancomycin (VANCOCIN) IVPB 1000 mg/200 mL premix  1,000 mg Intravenous  Q M,W,F-HD Rise Patience, MD       Labs: Basic Metabolic Panel: Recent Labs  Lab 04/17/19 0320  NA 134*  K 3.7  CL 94*  CO2 27  GLUCOSE 92  BUN 38*  CREATININE 5.45*  CALCIUM 8.7*  PHOS 5.4*   Liver Function Tests: Recent Labs  Lab 04/17/19 0320  AST 16  ALT 15  ALKPHOS 145*  BILITOT 3.7*  PROT 6.3*  ALBUMIN 1.7*   No results for input(s): LIPASE, AMYLASE in the last 168 hours. No results for input(s): AMMONIA in the last 168 hours. CBC: Recent Labs  Lab 04/17/19 0320  WBC 9.5  NEUTROABS 7.2  HGB 10.5*  HCT 33.1*  MCV 94.6  PLT 232   CBG: Recent Labs  Lab 04/17/19 0325 04/17/19 0746 04/17/19 1143  GLUCAP 80 87 93   Iron Studies: No results for input(s): IRON, TIBC, TRANSFERRIN, FERRITIN in the last 72 hours. Studies/Results: Ct Head Wo Contrast  Result Date: 04/17/2019 CLINICAL DATA:  Altered level of consciousness. EXAM: CT HEAD WITHOUT CONTRAST TECHNIQUE: Contiguous axial images were obtained from the base of the skull through the vertex without intravenous contrast. COMPARISON:  None. FINDINGS: Brain: No evidence of acute infarction, hemorrhage, hydrocephalus, extra-axial collection or mass lesion/mass effect. Vascular: None Skull: Normal. Negative for fracture or focal lesion. Sinuses/Orbits: There is diffuse mucosal thickening involving the sphenoid, maxillary and frontal sinuses as well as the ethmoid air cells. No air-fluid levels. Other: None IMPRESSION: 1. No acute intracranial abnormalities. 2. Chronic pan sinus inflammation. Electronically Signed   By: Kerby Moors M.D.   On:  04/17/2019 06:06   Portable Chest 1 View  Result Date: 04/17/2019 CLINICAL DATA:  38 year old female with fever. EXAM: PORTABLE CHEST 1 VIEW COMPARISON:  Chest radiograph dated 08/12/2018 FINDINGS: Right IJ central venous line with tip over right atrium close to the cavoatrial junction. There is mild cardiomegaly with mild vascular congestion. Scattered nodularity may represent congestion although atypical infection is not excluded. Clinical correlation is recommended. No focal consolidation, pleural effusion, or pneumothorax. No acute osseous pathology. IMPRESSION: 1. Cardiomegaly with mild vascular congestion. Pneumonia is not excluded. Clinical correlation is recommended. 2. Right IJ central venous line with tip over right atrium. Electronically Signed   By: Anner Crete M.D.   On: 04/17/2019 01:05   ROS: As per HPI otherwise negative.  Physical Exam: Vitals:   04/17/19 0522 04/17/19 0748 04/17/19 0900 04/17/19 1145  BP: 128/69 (!) 99/49 (!) 101/58 (!) 102/53  Pulse: 82 81  81  Resp: (!) 29 13 15 15   Temp:  97.8 F (36.6 C)  97.7 F (36.5 C)  TempSrc:  Axillary  Oral  SpO2: 99% 99%  96%  Weight:      Height:         General: -obese chronically ill appearing female NAD but spontaneously winces  at times.supine in bed Head: NCAT sclera not icteric MMM Neck: Supple.  Lungs: CTA bilaterally without wheezes, rales, or rhonchi. Breathing is unlabored. Heart: RRR with S1 S2.  Abdomen: soft NT + BS Lower extremities:without significant  edema or ischemic changes, no open wounds  Neuro: A & O  X 3. Moves all extremities spontaneously. Psych:  Responds to questions appropriately with a normal affect.  Dialysis Access: left lower AVF - + bruit - no erythema, drainage or tenderness  Dialysis Orders: [facility]  TTS - calcitriol 0.5 TIW EDW ~108 - 109 left-- will contact home HD unit for specific orders Records  from acute hospital show on 10/21 and 10/19 she was kept even and  only run for 3 hr - not on ESA hgb at OSH 11.1   Assessment/Plan: 1.  MRSA bacteremia - persistent without obvious source -repeat BC pending - ID consulted and TEE ordered. She did have a prior MRSA hand wound 07/2018. 2.  ESRD -  MWF - next HD Monday - has had some problems clotting off mid treatment - evaluate function on HD Monday 3.  Hypertension/volume  - BP down now - previously on coreg, hydralazine and clonidine - titrate meds down (w parameters to hold) - believe she has some room to lower volume 4.  Anemia  - hgb 10.5 - trend - if drops further add low dose ESA 5.  Metabolic bone disease -  Continue calcitriol with HD 6.  Nutrition - alb low - renal carb mod diet - add protein suppl 7. Transient confusion and agitation during prior acute admission prior to transfer to [facility]- seems to be at baseline now 8. DM/gout/GERD - meds per primary 9. Rheumatoid arthritis -  10. Recently diagnosed RLE DVT  04/11/19 - heparin drop for now 11. Elevated T. Bili - present at transfer  [name], PA-C [place] [phone] 04/17/2019, 1:05 PM

## 2019-04-17 NOTE — Progress Notes (Signed)
38 year old  Transferred from [place], for MSRA bacteremia and TEE.  Pt is ESRD ON HD and nephrology consulted.  Meanwhile ID on board for MRSA bacteremia. Pt currently on IV vancomycin.  Cardiology consulted for TEE on Monday.    Hosie Poisson, MD [phone]

## 2019-04-17 NOTE — Progress Notes (Signed)
Informed Dr. Cyndia Diver that lab has had multiple attempts at drawing blood cultures but they are unsuccessful. Also informed  MD that patient was chewing on her blankets and saying she was having weird cravings.

## 2019-04-17 NOTE — Progress Notes (Signed)
Pharmacy Antibiotic Note  Carla Compton is a 38 y.o. female with ESRD on HD MWF,  transferred from [facility]  on 04/10/2019 with MRSA bacteremia.  Positive blood cultures reported on 10/18, 10/19, and 10/21. Pharmacy has been consulted for Vancomycin dosing.  Initially on Vancomycin, but changed to Zyvox 600 mg BID on 10/20.  Received HD on 10/19 and 10/21. Unclear from records when last dose of Vancomycin was given, or whether pt had HD on 10/23..   Random Vancomycin level 10  Plan: Vancomycin 1 g IV now and after each HD F/U HD plans  Height: 5\' 2"  (157.5 cm) Weight: 246 lb 0.5 oz (111.6 kg) IBW/kg (Calculated) : 50.1  Temp (24hrs), Avg:98 F (36.7 C), Min:98 F (36.7 C), Max:98 F (36.7 C)  Recent Labs  Lab 04/17/19 0320  WBC 9.5  VANCORANDOM 10    CrCl cannot be calculated (Patient's most recent lab result is older than the maximum 21 days allowed.).    Allergies  Allergen Reactions  . Contrast Media [Iodinated Diagnostic Agents]      [name] 04/17/2019 4:35 AM

## 2019-04-18 LAB — CBC
HCT: 31 % — ABNORMAL LOW (ref 36.0–46.0)
Hemoglobin: 9.9 g/dL — ABNORMAL LOW (ref 12.0–15.0)
MCH: 30.1 pg (ref 26.0–34.0)
MCHC: 31.9 g/dL (ref 30.0–36.0)
MCV: 94.2 fL (ref 80.0–100.0)
Platelets: 242 10*3/uL (ref 150–400)
RBC: 3.29 MIL/uL — ABNORMAL LOW (ref 3.87–5.11)
RDW: 15.9 % — ABNORMAL HIGH (ref 11.5–15.5)
WBC: 12 10*3/uL — ABNORMAL HIGH (ref 4.0–10.5)
nRBC: 0 % (ref 0.0–0.2)

## 2019-04-18 LAB — GLUCOSE, CAPILLARY
Glucose-Capillary: 103 mg/dL — ABNORMAL HIGH (ref 70–99)
Glucose-Capillary: 107 mg/dL — ABNORMAL HIGH (ref 70–99)
Glucose-Capillary: 127 mg/dL — ABNORMAL HIGH (ref 70–99)
Glucose-Capillary: 119 mg/dL — ABNORMAL HIGH (ref 70–99)

## 2019-04-18 LAB — RETICULOCYTES
Immature Retic Fract: 14.2 % (ref 2.3–15.9)
RBC.: 3.49 MIL/uL — ABNORMAL LOW (ref 3.87–5.11)
Retic Count, Absolute: 36.3 10*3/uL (ref 19.0–186.0)
Retic Ct Pct: 1 % (ref 0.4–3.1)

## 2019-04-18 LAB — FOLATE: Folate: 10.1 ng/mL (ref >5.9)

## 2019-04-18 LAB — APTT
aPTT: 40 seconds — ABNORMAL HIGH (ref 24–36)
aPTT: 65 seconds — ABNORMAL HIGH (ref 24–36)

## 2019-04-18 LAB — HEPARIN LEVEL (UNFRACTIONATED): Heparin Unfractionated: 0.97 IU/mL — ABNORMAL HIGH (ref 0.30–0.70)

## 2019-04-18 MED ORDER — CHLORHEXIDINE GLUCONATE CLOTH 2 % EX PADS
6.0000 | MEDICATED_PAD | Freq: Every day | CUTANEOUS | Status: DC
  Administered 2019-04-19: 6 via TOPICAL

## 2019-04-18 MED ORDER — DARBEPOETIN ALFA 60 MCG/0.3ML IJ SOSY: 60.0000 ug | PREFILLED_SYRINGE | INTRAMUSCULAR | Status: DC

## 2019-04-18 MED ORDER — DARBEPOETIN ALFA 60 MCG/0.3ML IJ SOSY
60.0000 ug | PREFILLED_SYRINGE | INTRAMUSCULAR | Status: DC
  Filled 2019-04-18 (×2): qty 0.3

## 2019-04-18 NOTE — H&P (View-Only) (Signed)
PROGRESS NOTE    Carla Compton  [id number] DOB: Nov 06, 1980 DOA: 04/14/2019 PCP: Abigail Miyamoto, FNP    Brief Narrative:  38 year old lady with prior history of end-stage renal disease with schedule on Monday Wednesday Friday recently started on dialysis, recently diagnosed right lower extremity DVT on Xarelto, type 2 diabetes mellitus, hypertension, diastolic heart failure, chronic anemia initially admitted to [facility] for septic shock, briefly required pressors and was found to have MRSA bacteremia.  She was started on IV vancomycin and later on transition to Zyvox and meropenem for unknown reasons.  During her hospitalization at [facility] patient was agitated and confused and an initial CT of the head did not show any acute intracranial findings.  She was transferred to [facility] after discussing with infectious disease consultant Dr. Baxter Flattery.  After arrival to [facility] we consulted cardiology for TEE. TEE to be scheduled next week by cardiology  Assessment & Plan:   Principal Problem:   MRSA bacteremia Active Problems:   Uncontrolled secondary diabetes mellitus with stage 4 CKD (GFR 15-29) (HCC)   Normocytic anemia   Idiopathic chronic gout of multiple sites without tophus   ESRD (end stage renal disease) ([place])   Bacteremia    MRSA bacteremia With unclear source Patient started on IV vancomycin and repeat blood cultures have been ordered.  Cardiology requested to schedule TEE.  ID on board.    End-stage renal disease on hemodialysis Patient schedule days on Monday Wednesday and Friday.  Further management as per nephrology.   Essential hypertension Continue with Coreg and hydralazine.    Mild acute encephalopathy suspect metabolic. Patient was alert and and able to answer all questions yesterday.  But today patient appears more drowsy only responds by saying yes or no.  Initial CT of the head does not show any intracranial  abnormalities. MRI of the brain with and without contrast ordered for further evaluation. EEG ordered and pending.  Right lower extremity DVT Continue with IV heparin for now.  We will plan to transition to Xarelto on discharge.    Anemia of chronic disease/anemia secondary to chronic renal failure Transfuse to keep hemoglobin greater than 7.    Type 2 diabetes mellitus CBG (last 3)  Recent Labs    04/17/19 2023 04/18/19 0740 04/18/19 1151  GLUCAP 119* 103* 107*   Continue with sliding scale insulin Hemoglobin A1c 6.9     Hyperlipidemia  continue with Crestor at this time.   DVT prophylaxis: IV heparin Code Status: Full Family Communication: none at bedside.  Discussed with mother over the phone Disposition Plan: Pending clinical improvement and further evaluation   Consultants:   Nephrology  Cardiology  Infectious disease   Procedures: TEE scheduled next week.   Antimicrobials: IV VANCOMYCIN  Subjective: Patient is alert and able to answer simple questions.  Objective: Vitals:   04/18/19 0600 04/18/19 0904 04/18/19 1341 04/18/19 1440  BP: 101/61 (!) 94/51 109/63 104/61  Pulse: 76   76  Resp:    20  Temp:    97.6 F (36.4 C)  TempSrc:    Oral  SpO2:    100%  Weight:      Height:        Intake/Output Summary (Last 24 hours) at 04/18/2019 1525 Last data filed at 04/18/2019 1407 Gross per 24 hour  Intake 677.86 ml  Output 500 ml  Net 177.86 ml   Filed Weights   03/31/2019 2345 04/17/19 0521 04/18/19 0517  Weight: 111.6 kg 112.6  kg 112 kg    Examination:  General exam: Appears calm and comfortable  Respiratory system: Clear to auscultation. Respiratory effort normal. Cardiovascular system: S1 & S2 heard, RRR.  Gastrointestinal system: Abdomen is nondistended, soft and nontender. No organomegaly or masses felt. Normal bowel sounds heard. Central nervous system:sleepy, able to move all extremities but reports painful ROM No focal  neurological deficits. Extremities: trace pedal edema.  Skin: No rashes, Psychiatry:  Mood appropriate.     Data Reviewed: I have personally reviewed following labs and imaging studies  CBC: Recent Labs  Lab 04/17/19 0320 04/18/19 0224  WBC 9.5 12.0*  NEUTROABS 7.2  --   HGB 10.5* 9.9*  HCT 33.1* 31.0*  MCV 94.6 94.2  PLT 232 [id number]   Basic Metabolic Panel: Recent Labs  Lab 04/17/19 0320  NA 134*  K 3.7  CL 94*  CO2 27  GLUCOSE 92  BUN 38*  CREATININE 5.45*  CALCIUM 8.7*  MG 2.0  PHOS 5.4*   GFR: Estimated Creatinine Clearance: 16.9 mL/min (A) (by C-G formula based on SCr of 5.45 mg/dL (H)). Liver Function Tests: Recent Labs  Lab 04/17/19 0320  AST 16  ALT 15  ALKPHOS 145*  BILITOT 3.7*  PROT 6.3*  ALBUMIN 1.7*   No results for input(s): LIPASE, AMYLASE in the last 168 hours. No results for input(s): AMMONIA in the last 168 hours. Coagulation Profile: No results for input(s): INR, PROTIME in the last 168 hours. Cardiac Enzymes: No results for input(s): CKTOTAL, CKMB, CKMBINDEX, TROPONINI in the last 168 hours. BNP (last 3 results) No results for input(s): PROBNP in the last 8760 hours. HbA1C: Recent Labs    04/17/19 0320  HGBA1C 6.9*   CBG: Recent Labs  Lab 04/17/19 1143 04/17/19 1703 04/17/19 2023 04/18/19 0740 04/18/19 1151  GLUCAP 93 94 119* 103* 107*   Lipid Profile: No results for input(s): CHOL, HDL, LDLCALC, TRIG, CHOLHDL, LDLDIRECT in the last 72 hours. Thyroid Function Tests: Recent Labs    04/17/19 0320  TSH 3.334   Anemia Panel: No results for input(s): VITAMINB12, FOLATE, FERRITIN, TIBC, IRON, RETICCTPCT in the last 72 hours. Sepsis Labs: No results for input(s): PROCALCITON, LATICACIDVEN in the last 168 hours.  Recent Results (from the past 240 hour(s))  SARS CORONAVIRUS 2 (TAT 6-24 HRS) Nasopharyngeal Nasopharyngeal Swab     Status: None   Collection Time: 04/17/19 12:27 AM   Specimen: Nasopharyngeal Swab  Result  Value Ref Range Status   SARS Coronavirus 2 NEGATIVE NEGATIVE Final    Comment: (NOTE) SARS-CoV-2 target nucleic acids are NOT DETECTED. The SARS-CoV-2 RNA is generally detectable in upper and lower respiratory specimens during the acute phase of infection. Negative results do not preclude SARS-CoV-2 infection, do not rule out co-infections with other pathogens, and should not be used as the sole basis for treatment or other patient management decisions. Negative results must be combined with clinical observations, patient history, and epidemiological information. The expected result is Negative. Fact Sheet for Patients: SugarRoll.be Fact Sheet for Healthcare Providers: https://www.woods-mathews.com/ This test is not yet approved or cleared by the [place] FDA and  has been authorized for detection and/or diagnosis of SARS-CoV-2 by FDA under an Emergency Use Authorization (EUA). This EUA will remain  in effect (meaning this test can be used) for the duration of the COVID-19 declaration under Section 56 4(b)(1) of the Act, 21 U.S.C. section 360bbb-3(b)(1), unless the authorization is terminated or revoked sooner. Performed at [facility], [place]  [address], [place], [place] [address]   Culture, blood (routine x 2)     Status: None (Preliminary result)   Collection Time: 04/18/19  8:29 AM   Specimen: BLOOD RIGHT HAND  Result Value Ref Range Status   Specimen Description BLOOD RIGHT HAND  Final   Special Requests   Final    AEROBIC BOTTLE ONLY Blood Culture results may not be optimal due to an inadequate volume of blood received in culture bottles Performed at [facility] [address], [place], [place] [address]    Culture PENDING  Incomplete   Report Status PENDING  Incomplete         Radiology Studies: Ct Head Wo Contrast  Result Date: 04/17/2019 CLINICAL DATA:  Altered level of consciousness. EXAM: CT HEAD  WITHOUT CONTRAST TECHNIQUE: Contiguous axial images were obtained from the base of the skull through the vertex without intravenous contrast. COMPARISON:  None. FINDINGS: Brain: No evidence of acute infarction, hemorrhage, hydrocephalus, extra-axial collection or mass lesion/mass effect. Vascular: None Skull: Normal. Negative for fracture or focal lesion. Sinuses/Orbits: There is diffuse mucosal thickening involving the sphenoid, maxillary and frontal sinuses as well as the ethmoid air cells. No air-fluid levels. Other: None IMPRESSION: 1. No acute intracranial abnormalities. 2. Chronic pan sinus inflammation. Electronically Signed   By: Kerby Moors M.D.   On: 04/17/2019 06:06   Portable Chest 1 View  Result Date: 04/17/2019 CLINICAL DATA:  38 year old female with fever. EXAM: PORTABLE CHEST 1 VIEW COMPARISON:  Chest radiograph dated 08/12/2018 FINDINGS: Right IJ central venous line with tip over right atrium close to the cavoatrial junction. There is mild cardiomegaly with mild vascular congestion. Scattered nodularity may represent congestion although atypical infection is not excluded. Clinical correlation is recommended. No focal consolidation, pleural effusion, or pneumothorax. No acute osseous pathology. IMPRESSION: 1. Cardiomegaly with mild vascular congestion. Pneumonia is not excluded. Clinical correlation is recommended. 2. Right IJ central venous line with tip over right atrium. Electronically Signed   By: Anner Crete M.D.   On: 04/17/2019 01:05        Scheduled Meds: . allopurinol  100 mg Oral BID  . aspirin EC  81 mg Oral Daily  . [START ON 04/05/2019] calcitRIOL  0.5 mcg Oral Q M,W,F-HD  . carvedilol  25 mg Oral BID WC  . Chlorhexidine Gluconate Cloth  6 each Topical Daily  . Chlorhexidine Gluconate Cloth  6 each Topical Q0600  . [START ON 04/20/2019] darbepoetin (ARANESP) injection - DIALYSIS  60 mcg Intravenous Q Tue-HD  . feeding supplement (PRO-STAT SUGAR FREE 64)  30 mL  Oral BID  . hydrALAZINE  25 mg Oral Q8H  . insulin aspart  0-9 Units Subcutaneous TID WC  . multivitamin  1 tablet Oral QHS  . rosuvastatin  40 mg Oral QHS   Continuous Infusions: . heparin 1,700 Units/hr (04/18/19 0903)  . [START ON 04/11/2019] vancomycin       LOS: 2 days        Hosie Poisson, MD [facility] Pager [phone]  If 7PM-7AM, please contact night-coverage [email or web address] Password [email or web address] 04/18/2019, 3:25 PM

## 2019-04-18 NOTE — Progress Notes (Signed)
PROGRESS NOTE    Carla Compton  [id number] DOB: September 13, 1980 DOA: 04/03/2019 PCP: Abigail Miyamoto, FNP    Brief Narrative:  38 year old lady with prior history of end-stage renal disease with schedule on Monday Wednesday Friday recently started on dialysis, recently diagnosed right lower extremity DVT on Xarelto, type 2 diabetes mellitus, hypertension, diastolic heart failure, chronic anemia initially admitted to [facility] for septic shock, briefly required pressors and was found to have MRSA bacteremia.  She was started on IV vancomycin and later on transition to Zyvox and meropenem for unknown reasons.  During her hospitalization at [facility] patient was agitated and confused and an initial CT of the head did not show any acute intracranial findings.  She was transferred to [facility] after discussing with infectious disease consultant Dr. Baxter Flattery.  After arrival to [facility] we consulted cardiology for TEE. TEE to be scheduled next week by cardiology  Assessment & Plan:   Principal Problem:   MRSA bacteremia Active Problems:   Uncontrolled secondary diabetes mellitus with stage 4 CKD (GFR 15-29) (HCC)   Normocytic anemia   Idiopathic chronic gout of multiple sites without tophus   ESRD (end stage renal disease) ([place])   Bacteremia    MRSA bacteremia With unclear source Patient started on IV vancomycin and repeat blood cultures have been ordered.  Cardiology requested to schedule TEE.  ID on board.    End-stage renal disease on hemodialysis Patient schedule days on Monday Wednesday and Friday.  Further management as per nephrology.   Essential hypertension Continue with Coreg and hydralazine.    Mild acute encephalopathy suspect metabolic. Patient was alert and and able to answer all questions yesterday.  But today patient appears more drowsy only responds by saying yes or no.  Initial CT of the head does not show any intracranial  abnormalities. MRI of the brain with and without contrast ordered for further evaluation. EEG ordered and pending.  Right lower extremity DVT Continue with IV heparin for now.  We will plan to transition to Xarelto on discharge.    Anemia of chronic disease/anemia secondary to chronic renal failure Transfuse to keep hemoglobin greater than 7.    Type 2 diabetes mellitus CBG (last 3)  Recent Labs    04/17/19 2023 04/18/19 0740 04/18/19 1151  GLUCAP 119* 103* 107*   Continue with sliding scale insulin Hemoglobin A1c 6.9     Hyperlipidemia  continue with Crestor at this time.   DVT prophylaxis: IV heparin Code Status: Full Family Communication: none at bedside.  Discussed with mother over the phone Disposition Plan: Pending clinical improvement and further evaluation   Consultants:   Nephrology  Cardiology  Infectious disease   Procedures: TEE scheduled next week.   Antimicrobials: IV VANCOMYCIN  Subjective: Patient is alert and able to answer simple questions.  Objective: Vitals:   04/18/19 0600 04/18/19 0904 04/18/19 1341 04/18/19 1440  BP: 101/61 (!) 94/51 109/63 104/61  Pulse: 76   76  Resp:    20  Temp:    97.6 F (36.4 C)  TempSrc:    Oral  SpO2:    100%  Weight:      Height:        Intake/Output Summary (Last 24 hours) at 04/18/2019 1525 Last data filed at 04/18/2019 1407 Gross per 24 hour  Intake 677.86 ml  Output 500 ml  Net 177.86 ml   Filed Weights   03/28/2019 2345 04/17/19 0521 04/18/19 0517  Weight: 111.6 kg 112.6  kg 112 kg    Examination:  General exam: Appears calm and comfortable  Respiratory system: Clear to auscultation. Respiratory effort normal. Cardiovascular system: S1 & S2 heard, RRR.  Gastrointestinal system: Abdomen is nondistended, soft and nontender. No organomegaly or masses felt. Normal bowel sounds heard. Central nervous system:sleepy, able to move all extremities but reports painful ROM No focal  neurological deficits. Extremities: trace pedal edema.  Skin: No rashes, Psychiatry:  Mood appropriate.     Data Reviewed: I have personally reviewed following labs and imaging studies  CBC: Recent Labs  Lab 04/17/19 0320 04/18/19 0224  WBC 9.5 12.0*  NEUTROABS 7.2  --   HGB 10.5* 9.9*  HCT 33.1* 31.0*  MCV 94.6 94.2  PLT 232 [id number]   Basic Metabolic Panel: Recent Labs  Lab 04/17/19 0320  NA 134*  K 3.7  CL 94*  CO2 27  GLUCOSE 92  BUN 38*  CREATININE 5.45*  CALCIUM 8.7*  MG 2.0  PHOS 5.4*   GFR: Estimated Creatinine Clearance: 16.9 mL/min (A) (by C-G formula based on SCr of 5.45 mg/dL (H)). Liver Function Tests: Recent Labs  Lab 04/17/19 0320  AST 16  ALT 15  ALKPHOS 145*  BILITOT 3.7*  PROT 6.3*  ALBUMIN 1.7*   No results for input(s): LIPASE, AMYLASE in the last 168 hours. No results for input(s): AMMONIA in the last 168 hours. Coagulation Profile: No results for input(s): INR, PROTIME in the last 168 hours. Cardiac Enzymes: No results for input(s): CKTOTAL, CKMB, CKMBINDEX, TROPONINI in the last 168 hours. BNP (last 3 results) No results for input(s): PROBNP in the last 8760 hours. HbA1C: Recent Labs    04/17/19 0320  HGBA1C 6.9*   CBG: Recent Labs  Lab 04/17/19 1143 04/17/19 1703 04/17/19 2023 04/18/19 0740 04/18/19 1151  GLUCAP 93 94 119* 103* 107*   Lipid Profile: No results for input(s): CHOL, HDL, LDLCALC, TRIG, CHOLHDL, LDLDIRECT in the last 72 hours. Thyroid Function Tests: Recent Labs    04/17/19 0320  TSH 3.334   Anemia Panel: No results for input(s): VITAMINB12, FOLATE, FERRITIN, TIBC, IRON, RETICCTPCT in the last 72 hours. Sepsis Labs: No results for input(s): PROCALCITON, LATICACIDVEN in the last 168 hours.  Recent Results (from the past 240 hour(s))  SARS CORONAVIRUS 2 (TAT 6-24 HRS) Nasopharyngeal Nasopharyngeal Swab     Status: None   Collection Time: 04/17/19 12:27 AM   Specimen: Nasopharyngeal Swab  Result  Value Ref Range Status   SARS Coronavirus 2 NEGATIVE NEGATIVE Final    Comment: (NOTE) SARS-CoV-2 target nucleic acids are NOT DETECTED. The SARS-CoV-2 RNA is generally detectable in upper and lower respiratory specimens during the acute phase of infection. Negative results do not preclude SARS-CoV-2 infection, do not rule out co-infections with other pathogens, and should not be used as the sole basis for treatment or other patient management decisions. Negative results must be combined with clinical observations, patient history, and epidemiological information. The expected result is Negative. Fact Sheet for Patients: SugarRoll.be Fact Sheet for Healthcare Providers: https://www.woods-mathews.com/ This test is not yet approved or cleared by the [place] FDA and  has been authorized for detection and/or diagnosis of SARS-CoV-2 by FDA under an Emergency Use Authorization (EUA). This EUA will remain  in effect (meaning this test can be used) for the duration of the COVID-19 declaration under Section 56 4(b)(1) of the Act, 21 U.S.C. section 360bbb-3(b)(1), unless the authorization is terminated or revoked sooner. Performed at [facility], [place]  [address], [place], [place] [address]   Culture, blood (routine x 2)     Status: None (Preliminary result)   Collection Time: 04/18/19  8:29 AM   Specimen: BLOOD RIGHT HAND  Result Value Ref Range Status   Specimen Description BLOOD RIGHT HAND  Final   Special Requests   Final    AEROBIC BOTTLE ONLY Blood Culture results may not be optimal due to an inadequate volume of blood received in culture bottles Performed at [facility] [address], [place], [place] [address]    Culture PENDING  Incomplete   Report Status PENDING  Incomplete         Radiology Studies: Ct Head Wo Contrast  Result Date: 04/17/2019 CLINICAL DATA:  Altered level of consciousness. EXAM: CT HEAD  WITHOUT CONTRAST TECHNIQUE: Contiguous axial images were obtained from the base of the skull through the vertex without intravenous contrast. COMPARISON:  None. FINDINGS: Brain: No evidence of acute infarction, hemorrhage, hydrocephalus, extra-axial collection or mass lesion/mass effect. Vascular: None Skull: Normal. Negative for fracture or focal lesion. Sinuses/Orbits: There is diffuse mucosal thickening involving the sphenoid, maxillary and frontal sinuses as well as the ethmoid air cells. No air-fluid levels. Other: None IMPRESSION: 1. No acute intracranial abnormalities. 2. Chronic pan sinus inflammation. Electronically Signed   By: Kerby Moors M.D.   On: 04/17/2019 06:06   Portable Chest 1 View  Result Date: 04/17/2019 CLINICAL DATA:  38 year old female with fever. EXAM: PORTABLE CHEST 1 VIEW COMPARISON:  Chest radiograph dated 08/12/2018 FINDINGS: Right IJ central venous line with tip over right atrium close to the cavoatrial junction. There is mild cardiomegaly with mild vascular congestion. Scattered nodularity may represent congestion although atypical infection is not excluded. Clinical correlation is recommended. No focal consolidation, pleural effusion, or pneumothorax. No acute osseous pathology. IMPRESSION: 1. Cardiomegaly with mild vascular congestion. Pneumonia is not excluded. Clinical correlation is recommended. 2. Right IJ central venous line with tip over right atrium. Electronically Signed   By: Anner Crete M.D.   On: 04/17/2019 01:05        Scheduled Meds: . allopurinol  100 mg Oral BID  . aspirin EC  81 mg Oral Daily  . [START ON 04/03/2019] calcitRIOL  0.5 mcg Oral Q M,W,F-HD  . carvedilol  25 mg Oral BID WC  . Chlorhexidine Gluconate Cloth  6 each Topical Daily  . Chlorhexidine Gluconate Cloth  6 each Topical Q0600  . [START ON 04/20/2019] darbepoetin (ARANESP) injection - DIALYSIS  60 mcg Intravenous Q Tue-HD  . feeding supplement (PRO-STAT SUGAR FREE 64)  30 mL  Oral BID  . hydrALAZINE  25 mg Oral Q8H  . insulin aspart  0-9 Units Subcutaneous TID WC  . multivitamin  1 tablet Oral QHS  . rosuvastatin  40 mg Oral QHS   Continuous Infusions: . heparin 1,700 Units/hr (04/18/19 0903)  . [START ON 04/07/2019] vancomycin       LOS: 2 days        Hosie Poisson, MD [facility] Pager [phone]  If 7PM-7AM, please contact night-coverage [email or web address] Password [email or web address] 04/18/2019, 3:25 PM

## 2019-04-18 NOTE — Progress Notes (Signed)
[facility] for Heparin Indication: DVT  Allergies  Allergen Reactions  . Contrast Media [Iodinated Diagnostic Agents] Other (See Comments)    Affected her kidneys  . Pepto-Bismol [Bismuth] Other (See Comments)    Unknown reaction    Patient Measurements: Height: 5\' 2"  (157.5 cm) Weight: 246 lb 14.6 oz (112 kg) IBW/kg (Calculated) : 50.1 Heparin Dosing Weight: 80 kg  Vital Signs: Temp: 97.6 F (36.4 C) (10/25 1440) Temp Source: Oral (10/25 1440) BP: 104/61 (10/25 1440) Pulse Rate: 76 (10/25 1440)  Labs: Recent Labs    04/17/19 0320 04/17/19 2006 04/18/19 0224 04/18/19 0809 04/18/19 1700  HGB 10.5*  --  9.9*  --   --   HCT 33.1*  --  31.0*  --   --   PLT 232  --  242  --   --   APTT  --  44*  --  40* 65*  HEPARINUNFRC  --  1.54*  --  0.97*  --   CREATININE 5.45*  --   --   --   --     Estimated Creatinine Clearance: 16.9 mL/min (A) (by C-G formula based on SCr of 5.45 mg/dL (H)).   Assessment: 38 y.o. female with recent DVT treated with Xarelto (last dose at 7 am 10/23) on heparin drip. -aPTT up to 65 on 1700 units/hr  Goal of Therapy:  APTT 66-102 sec Heparin level 0.3-0.7 units/ml Monitor platelets by anticoagulation protocol: Yes   Plan:  - Increase heparin drip to 1800 units/hr - Daily heparin level, aPTT, CBC  Thank you for involving pharmacy in this patient's care.  Hildred Laser, PharmD Clinical Pharmacist **Pharmacist phone directory can now be found on [email or web address] (PW [email or web address]).  Listed under [place].

## 2019-04-18 NOTE — Progress Notes (Signed)
Carla Compton Progress Note   Dialysis Orders: Carla Compton  MWF 4 hr  - calcitriol 0.5 TIW EDW ~108 - 109 per pt's recall= usual post HD wt - need to contact home HD unit Monday for specific orders Records from acute hospital show on 10/21 and 10/19 she was kept even and only run for 3 hr - not on ESA hgb at OSH 11.1   Assessment/Plan: 1.  MRSA bacteremia - persistent without obvious source -repeat BC pending - ID consulted and TEE ordered. She did have a prior MRSA hand wound 07/2018. 2.  ESRD -  MWF - next HD Monday - has had some problems clotting off mid treatment - evaluate function on HD Monday - if problems can refer to IR while here - work dialysis around TEE  3.  Hypertension/volume  - BP down now - previously on coreg 25 bid - all am doses held today - may want to lower coreg dose, hydralazine reduced to 25 tid from 50 tid 10/24  and clonidine 0.2 - believe she has some room to lower volume, stop clonidine for now CXR showed vascular congestion 4.  Anemia  - hgb 10.5 > 9.9  Start Aranesp 60 Tuesday  5.  Metabolic bone disease -  Continue calcitriol with HD 6.  Nutrition - alb low - renal carb mod diet - add protein suppl 7. Transient confusion and agitation during prior acute admission prior to transfer to [facility]- seems groggier today than yesterday 8. DM/gout/GERD - meds per primary 9. Rheumatoid arthritis -  10. Recently diagnosed RLE DVT  04/11/19 - heparin drop for now 11. Elevated T. Bili - present at transfer  Carla Jacobson, PA-C [place] 04/18/2019,9:54 AM  LOS: 2 days   Subjective:   Reports to feels a little better today with less pain - hardly ate any breakfast - it's just not what she eats.  Has had problems with access in the past and also intervention in [place] but can't recall specifics.  Objective Vitals:   04/18/19 0315 04/18/19 0517 04/18/19 0600 04/18/19 0904  BP: (!) 103/57 102/60 101/61 (!) 94/51   Pulse:  75 76   Resp:  19    Temp:  (!) 97.5 F (36.4 C)    TempSrc:  Oral    SpO2:  100%    Weight:  112 kg    Height:       Physical Exam General: NAD drowsy Heart: RRR Lungs: grossly clear Abdomen:obese soft NTND Extremities: no sig LE edema Dialysis Access: left lower AVF + bruit/thrill   Additional Objective Labs: Basic Metabolic Panel: Recent Labs  Lab 04/17/19 0320  NA 134*  K 3.7  CL 94*  CO2 27  GLUCOSE 92  BUN 38*  CREATININE 5.45*  CALCIUM 8.7*  PHOS 5.4*   Liver Function Tests: Recent Labs  Lab 04/17/19 0320  AST 16  ALT 15  ALKPHOS 145*  BILITOT 3.7*  PROT 6.3*  ALBUMIN 1.7*   No results for input(s): LIPASE, AMYLASE in the last 168 hours. CBC: Recent Labs  Lab 04/17/19 0320 04/18/19 0224  WBC 9.5 12.0*  NEUTROABS 7.2  --   HGB 10.5* 9.9*  HCT 33.1* 31.0*  MCV 94.6 94.2  PLT 232 242   Blood Culture    Component Value Date/Time   SDES BLOOD RIGHT HAND 04/18/2019 0829   SPECREQUEST  04/18/2019 0829    AEROBIC BOTTLE ONLY Blood Culture results may not be optimal due  to an inadequate volume of blood received in culture bottles Performed at [facility] [address], [place], [place] [address]    CULT PENDING 04/18/2019 [id number]   REPTSTATUS PENDING 04/18/2019 [id number]    Cardiac Enzymes: No results for input(s): CKTOTAL, CKMB, CKMBINDEX, TROPONINI in the last 168 hours. CBG: Recent Labs  Lab 04/17/19 0746 04/17/19 1143 04/17/19 1703 04/17/19 2023 04/18/19 0740  GLUCAP 87 93 94 119* 103*   Iron Studies: No results for input(s): IRON, TIBC, TRANSFERRIN, FERRITIN in the last 72 hours. No results found for: INR, PROTIME Studies/Results: Ct Head Wo Contrast  Result Date: 04/17/2019 CLINICAL DATA:  Altered level of consciousness. EXAM: CT HEAD WITHOUT CONTRAST TECHNIQUE: Contiguous axial images were obtained from the base of the skull through the vertex without intravenous contrast. COMPARISON:  None. FINDINGS: Brain: No  evidence of acute infarction, hemorrhage, hydrocephalus, extra-axial collection or mass lesion/mass effect. Vascular: None Skull: Normal. Negative for fracture or focal lesion. Sinuses/Orbits: There is diffuse mucosal thickening involving the sphenoid, maxillary and frontal sinuses as well as the ethmoid air cells. No air-fluid levels. Other: None IMPRESSION: 1. No acute intracranial abnormalities. 2. Chronic pan sinus inflammation. Electronically Signed   By: Kerby Moors M.D.   On: 04/17/2019 06:06   Portable Chest 1 View  Result Date: 04/17/2019 CLINICAL DATA:  38 year old female with fever. EXAM: PORTABLE CHEST 1 VIEW COMPARISON:  Chest radiograph dated 08/12/2018 FINDINGS: Right IJ central venous line with tip over right atrium close to the cavoatrial junction. There is mild cardiomegaly with mild vascular congestion. Scattered nodularity may represent congestion although atypical infection is not excluded. Clinical correlation is recommended. No focal consolidation, pleural effusion, or pneumothorax. No acute osseous pathology. IMPRESSION: 1. Cardiomegaly with mild vascular congestion. Pneumonia is not excluded. Clinical correlation is recommended. 2. Right IJ central venous line with tip over right atrium. Electronically Signed   By: Anner Crete M.D.   On: 04/17/2019 01:05   Medications: . heparin 1,700 Units/hr (04/18/19 0903)  . [START ON 04/05/2019] vancomycin     . allopurinol  100 mg Oral BID  . aspirin EC  81 mg Oral Daily  . [START ON 04/05/2019] calcitRIOL  0.5 mcg Oral Q M,W,F-HD  . carvedilol  25 mg Oral BID WC  . Chlorhexidine Gluconate Cloth  6 each Topical Daily  . cloNIDine  0.2 mg Oral Daily  . feeding supplement (PRO-STAT SUGAR FREE 64)  30 mL Oral BID  . hydrALAZINE  25 mg Oral Q8H  . insulin aspart  0-9 Units Subcutaneous TID WC  . multivitamin  1 tablet Oral QHS  . rosuvastatin  40 mg Oral QHS

## 2019-04-18 NOTE — Progress Notes (Signed)
[facility] for Heparin Indication: DVT  Allergies  Allergen Reactions  . Contrast Media [Iodinated Diagnostic Agents] Other (See Comments)    Affected her kidneys  . Pepto-Bismol [Bismuth] Other (See Comments)    Unknown reaction    Patient Measurements: Height: 5\' 2"  (157.5 cm) Weight: 246 lb 14.6 oz (112 kg) IBW/kg (Calculated) : 50.1 Heparin Dosing Weight: 80 kg  Vital Signs: Temp: 97.5 F (36.4 C) (10/25 0517) Temp Source: Oral (10/25 0517) BP: 101/61 (10/25 0600) Pulse Rate: 76 (10/25 0600)  Labs: Recent Labs    04/17/19 0320 04/17/19 2006 04/18/19 0224 04/18/19 0809  HGB 10.5*  --  9.9*  --   HCT 33.1*  --  31.0*  --   PLT 232  --  242  --   APTT  --  44*  --  40*  HEPARINUNFRC  --  1.54*  --   --   CREATININE 5.45*  --   --   --     Estimated Creatinine Clearance: 16.9 mL/min (A) (by C-G formula based on SCr of 5.45 mg/dL (H)).   Assessment: 38 y.o. female with recent DVT treated with Xarelto (last dose at 7 am 10/23) on heparin drip.  - aPTT is subtherapeutic at 40 sec and decreased despite last rate change. Per RN, heparin infusing in IJ. - Heparin level is 0.97 and still being affected by Xarelto.  Goal of Therapy:  APTT 66-102 sec Heparin level 0.3-0.7 units/ml Monitor platelets by anticoagulation protocol: Yes   Plan:  - Increase heparin drip to 1700 units/hr - 8h aPTT - Daily heparin level, aPTT, CBC - Monitor for s/sx of bleeding  Thank you for involving pharmacy in this patient's care.  Renold Genta, PharmD, BCPS Clinical Pharmacist Clinical phone for 04/18/2019 until 3p is [phone] 04/18/2019 8:46 AM  **Pharmacist phone directory can be found on [email or web address] listed under [place]**

## 2019-04-19 ENCOUNTER — Inpatient Hospital Stay (HOSPITAL_COMMUNITY): Payer: BLUE CROSS/BLUE SHIELD

## 2019-04-19 ENCOUNTER — Inpatient Hospital Stay (HOSPITAL_COMMUNITY): Payer: BLUE CROSS/BLUE SHIELD | Admitting: Certified Registered Nurse Anesthetist

## 2019-04-19 ENCOUNTER — Encounter (HOSPITAL_COMMUNITY): Payer: Self-pay | Admitting: Certified Registered Nurse Anesthetist

## 2019-04-19 ENCOUNTER — Encounter (HOSPITAL_COMMUNITY): Admission: AD | Disposition: E | Payer: Self-pay | Source: Other Acute Inpatient Hospital | Attending: Pulmonary Disease

## 2019-04-19 DIAGNOSIS — Z992 Dependence on renal dialysis: Secondary | ICD-10-CM

## 2019-04-19 DIAGNOSIS — M25551 Pain in right hip: Secondary | ICD-10-CM

## 2019-04-19 DIAGNOSIS — E1122 Type 2 diabetes mellitus with diabetic chronic kidney disease: Secondary | ICD-10-CM

## 2019-04-19 DIAGNOSIS — R7881 Bacteremia: Secondary | ICD-10-CM

## 2019-04-19 DIAGNOSIS — I361 Nonrheumatic tricuspid (valve) insufficiency: Secondary | ICD-10-CM | POA: Diagnosis not present

## 2019-04-19 DIAGNOSIS — A419 Sepsis, unspecified organism: Secondary | ICD-10-CM | POA: Diagnosis not present

## 2019-04-19 DIAGNOSIS — I132 Hypertensive heart and chronic kidney disease with heart failure and with stage 5 chronic kidney disease, or end stage renal disease: Secondary | ICD-10-CM

## 2019-04-19 DIAGNOSIS — Z7901 Long term (current) use of anticoagulants: Secondary | ICD-10-CM

## 2019-04-19 DIAGNOSIS — I5030 Unspecified diastolic (congestive) heart failure: Secondary | ICD-10-CM

## 2019-04-19 DIAGNOSIS — B9562 Methicillin resistant Staphylococcus aureus infection as the cause of diseases classified elsewhere: Secondary | ICD-10-CM | POA: Diagnosis not present

## 2019-04-19 DIAGNOSIS — Z91041 Radiographic dye allergy status: Secondary | ICD-10-CM

## 2019-04-19 DIAGNOSIS — Z888 Allergy status to other drugs, medicaments and biological substances status: Secondary | ICD-10-CM

## 2019-04-19 DIAGNOSIS — D649 Anemia, unspecified: Secondary | ICD-10-CM

## 2019-04-19 DIAGNOSIS — Z86718 Personal history of other venous thrombosis and embolism: Secondary | ICD-10-CM

## 2019-04-19 DIAGNOSIS — N186 End stage renal disease: Secondary | ICD-10-CM

## 2019-04-19 HISTORY — PX: TEE WITHOUT CARDIOVERSION: SHX5443

## 2019-04-19 LAB — COMPREHENSIVE METABOLIC PANEL
ALT: 14 U/L (ref 0–44)
AST: 25 U/L (ref 15–41)
Albumin: 1.7 g/dL — ABNORMAL LOW (ref 3.5–5.0)
Alkaline Phosphatase: 254 U/L — ABNORMAL HIGH (ref 38–126)
Anion gap: 17 — ABNORMAL HIGH (ref 5–15)
BUN: 61 mg/dL — ABNORMAL HIGH (ref 6–20)
CO2: 23 mmol/L (ref 22–32)
Calcium: 7.7 mg/dL — ABNORMAL LOW (ref 8.9–10.3)
Chloride: 93 mmol/L — ABNORMAL LOW (ref 98–111)
Creatinine, Ser: 7.82 mg/dL — ABNORMAL HIGH (ref 0.44–1.00)
GFR calc Af Amer: 7 mL/min — ABNORMAL LOW (ref >60)
GFR calc non Af Amer: 6 mL/min — ABNORMAL LOW (ref >60)
Glucose, Bld: 116 mg/dL — ABNORMAL HIGH (ref 70–99)
Potassium: 4.8 mmol/L (ref 3.5–5.1)
Sodium: 133 mmol/L — ABNORMAL LOW (ref 135–145)
Total Bilirubin: 3.7 mg/dL — ABNORMAL HIGH (ref 0.3–1.2)
Total Protein: 6.3 g/dL — ABNORMAL LOW (ref 6.5–8.1)

## 2019-04-19 LAB — RENAL FUNCTION PANEL
Albumin: 1.6 g/dL — ABNORMAL LOW (ref 3.5–5.0)
Anion gap: 19 — ABNORMAL HIGH (ref 5–15)
BUN: 68 mg/dL — ABNORMAL HIGH (ref 6–20)
CO2: 20 mmol/L — ABNORMAL LOW (ref 22–32)
Calcium: 7.6 mg/dL — ABNORMAL LOW (ref 8.9–10.3)
Chloride: 92 mmol/L — ABNORMAL LOW (ref 98–111)
Creatinine, Ser: 8.54 mg/dL — ABNORMAL HIGH (ref 0.44–1.00)
GFR calc Af Amer: 6 mL/min — ABNORMAL LOW (ref >60)
GFR calc non Af Amer: 5 mL/min — ABNORMAL LOW (ref >60)
Glucose, Bld: 88 mg/dL (ref 70–99)
Phosphorus: 8.8 mg/dL — ABNORMAL HIGH (ref 2.5–4.6)
Potassium: 5 mmol/L (ref 3.5–5.1)
Sodium: 131 mmol/L — ABNORMAL LOW (ref 135–145)

## 2019-04-19 LAB — CBC
HCT: 32.4 % — ABNORMAL LOW (ref 36.0–46.0)
HCT: 31.3 % — ABNORMAL LOW (ref 36.0–46.0)
Hemoglobin: 10.2 g/dL — ABNORMAL LOW (ref 12.0–15.0)
Hemoglobin: 9.9 g/dL — ABNORMAL LOW (ref 12.0–15.0)
MCH: 29.7 pg (ref 26.0–34.0)
MCH: 30 pg (ref 26.0–34.0)
MCHC: 31.5 g/dL (ref 30.0–36.0)
MCHC: 31.6 g/dL (ref 30.0–36.0)
MCV: 94.2 fL (ref 80.0–100.0)
MCV: 94.8 fL (ref 80.0–100.0)
Platelets: 230 10*3/uL (ref 150–400)
Platelets: 221 K/uL (ref 150–400)
RBC: 3.44 MIL/uL — ABNORMAL LOW (ref 3.87–5.11)
RBC: 3.3 MIL/uL — ABNORMAL LOW (ref 3.87–5.11)
RDW: 16 % — ABNORMAL HIGH (ref 11.5–15.5)
RDW: 16.1 % — ABNORMAL HIGH (ref 11.5–15.5)
WBC: 12.8 10*3/uL — ABNORMAL HIGH (ref 4.0–10.5)
WBC: 13 K/uL — ABNORMAL HIGH (ref 4.0–10.5)
nRBC: 0 % (ref 0.0–0.2)
nRBC: 0 % (ref 0.0–0.2)

## 2019-04-19 LAB — FERRITIN: Ferritin: 2589 ng/mL — ABNORMAL HIGH (ref 11–307)

## 2019-04-19 LAB — HEPARIN LEVEL (UNFRACTIONATED): Heparin Unfractionated: 0.9 IU/mL — ABNORMAL HIGH (ref 0.30–0.70)

## 2019-04-19 LAB — IRON AND TIBC
Iron: 50 ug/dL (ref 28–170)
Saturation Ratios: 43 % — ABNORMAL HIGH (ref 10.4–31.8)
TIBC: 116 ug/dL — ABNORMAL LOW (ref 250–450)
UIBC: 66 ug/dL

## 2019-04-19 LAB — APTT: aPTT: 79 seconds — ABNORMAL HIGH (ref 24–36)

## 2019-04-19 LAB — GLUCOSE, CAPILLARY
Glucose-Capillary: 106 mg/dL — ABNORMAL HIGH (ref 70–99)
Glucose-Capillary: 95 mg/dL (ref 70–99)
Glucose-Capillary: 75 mg/dL (ref 70–99)
Glucose-Capillary: 97 mg/dL (ref 70–99)
Glucose-Capillary: 111 mg/dL — ABNORMAL HIGH (ref 70–99)

## 2019-04-19 LAB — LACTIC ACID, PLASMA: Lactic Acid, Venous: 0.7 mmol/L (ref 0.5–1.9)

## 2019-04-19 LAB — MRSA PCR SCREENING: MRSA by PCR: POSITIVE — AB

## 2019-04-19 LAB — VITAMIN B12: Vitamin B-12: 1205 pg/mL — ABNORMAL HIGH (ref 180–914)

## 2019-04-19 SURGERY — ECHOCARDIOGRAM, TRANSESOPHAGEAL
Anesthesia: Monitor Anesthesia Care

## 2019-04-19 MED ORDER — LIDOCAINE HCL (PF) 1 % IJ SOLN
5.0000 mL | INTRAMUSCULAR | Status: DC | PRN
  Filled 2019-04-19: qty 5

## 2019-04-19 MED ORDER — CARVEDILOL 12.5 MG PO TABS: 12.5000 mg | ORAL_TABLET | Freq: Two times a day (BID) | ORAL | Status: DC

## 2019-04-19 MED ORDER — SEVELAMER CARBONATE 800 MG PO TABS
800.0000 mg | ORAL_TABLET | Freq: Three times a day (TID) | ORAL | Status: DC
  Administered 2019-04-19 – 2019-04-26 (×3): 800 mg via ORAL
  Filled 2019-04-19 (×23): qty 1

## 2019-04-19 MED ORDER — ALTEPLASE 2 MG IJ SOLR
2.0000 mg | Freq: Once | INTRAMUSCULAR | Status: DC | PRN
  Filled 2019-04-19: qty 2

## 2019-04-19 MED ORDER — LIDOCAINE-PRILOCAINE 2.5-2.5 % EX CREA
1.0000 "application " | TOPICAL_CREAM | CUTANEOUS | Status: DC | PRN
  Filled 2019-04-19: qty 5

## 2019-04-19 MED ORDER — PENTAFLUOROPROP-TETRAFLUOROETH EX AERO: 1.0000 "application " | INHALATION_SPRAY | CUTANEOUS | Status: DC | PRN

## 2019-04-19 MED ORDER — PROPOFOL 500 MG/50ML IV EMUL
INTRAVENOUS | Status: DC | PRN
  Administered 2019-04-19: 100 ug/kg/min via INTRAVENOUS

## 2019-04-19 MED ORDER — SODIUM CHLORIDE 0.9 % IV BOLUS
250.0000 mL | Freq: Once | INTRAVENOUS | Status: AC
  Administered 2019-04-19: 22:00:00 250 mL via INTRAVENOUS

## 2019-04-19 MED ORDER — NOREPINEPHRINE 4 MG/250ML-% IV SOLN
2.0000 ug/min | INTRAVENOUS | Status: DC
  Filled 2019-04-19: qty 250

## 2019-04-19 MED ORDER — SODIUM CHLORIDE 0.9 % IV BOLUS
250.0000 mL | Freq: Once | INTRAVENOUS | Status: AC
  Administered 2019-04-19: 250 mL via INTRAVENOUS

## 2019-04-19 MED ORDER — PHENYLEPHRINE 40 MCG/ML (10ML) SYRINGE FOR IV PUSH (FOR BLOOD PRESSURE SUPPORT)
PREFILLED_SYRINGE | INTRAVENOUS | Status: DC | PRN
  Administered 2019-04-19: 80 ug via INTRAVENOUS
  Administered 2019-04-19: 120 ug via INTRAVENOUS
  Administered 2019-04-19: 80 ug via INTRAVENOUS

## 2019-04-19 MED ORDER — PROPOFOL 10 MG/ML IV BOLUS
INTRAVENOUS | Status: DC | PRN
  Administered 2019-04-19: 30 mg via INTRAVENOUS

## 2019-04-19 MED ORDER — SODIUM CHLORIDE 0.9 % IV SOLN: 100.0000 mL | INTRAVENOUS | Status: DC | PRN

## 2019-04-19 MED ORDER — HEPARIN SODIUM (PORCINE) 1000 UNIT/ML DIALYSIS
20.0000 [IU]/kg | INTRAMUSCULAR | Status: DC | PRN
  Filled 2019-04-19: qty 3

## 2019-04-19 MED ORDER — EPHEDRINE SULFATE-NACL 50-0.9 MG/10ML-% IV SOSY
PREFILLED_SYRINGE | INTRAVENOUS | Status: DC | PRN
  Administered 2019-04-19: 5 mg via INTRAVENOUS
  Administered 2019-04-19: 10 mg via INTRAVENOUS

## 2019-04-19 MED ORDER — SODIUM CHLORIDE 0.9 % IV SOLN: INTRAVENOUS | Status: DC

## 2019-04-19 MED ORDER — HEPARIN SODIUM (PORCINE) 1000 UNIT/ML DIALYSIS
1000.0000 [IU] | INTRAMUSCULAR | Status: DC | PRN
  Filled 2019-04-19: qty 1

## 2019-04-19 MED ORDER — SODIUM CHLORIDE 0.9 % IV SOLN: 250.0000 mL | INTRAVENOUS | Status: DC

## 2019-04-19 MED ORDER — SODIUM CHLORIDE 0.9 % IV SOLN
100.0000 mL | INTRAVENOUS | Status: DC | PRN
  Administered 2019-04-19 (×2): 100 mL via INTRAVENOUS

## 2019-04-19 MED ORDER — SODIUM CHLORIDE 0.9 % IV SOLN
INTRAVENOUS | Status: DC | PRN
  Administered 2019-04-19: 13:00:00 via INTRAVENOUS

## 2019-04-19 NOTE — Progress Notes (Signed)
EEG complete - results pending 

## 2019-04-19 NOTE — Significant Event (Addendum)
Rapid Response Event Note  Overview:Called d/t BP-85/42 and lethargy. Pt had already been given total of 450cc NS. Asked RN to call MD back with follow-up BP and RR RN would come as soon as possible. Called again at 2304 with orders for levophed gtt but no transfer orders. Asked RN to repage MD to consult PCCM and move to ICU.  Time Called: 2250 Arrival Time: 2315 Event Type: Neurologic, Hypotension  Initial Focused Assessment: Pt laying in bed with eyes closed. Pt will open eyes to voice, follow commands, and move all extremities, however falls back asleep very quickly. Pt alert to self and year. Skin warm and dry, lungs diminished t/o. T-98.1, HR-83, BP-76/39, RR-20, SpO2-100% on 2L [place], CBG-111. Pt gets HD, last treatment was Friday (10/23).  Interventions: 450cc NS bolus PTA RRT, additional 250cc NS bolus being given  Lactic acid, U/A, PTT PCCM consulted. ABG-7.22/60.7/84.3/24.1 PCXR-Patchy multifocal opacities consistent with pneumonia.Patchy multifocal opacities consistent with pneumonia. Additional 1 L NS bolus ordered Will move to ICU.   Event Summary: Name of Physician Notified: Lennox Grumbles, NP at (PTA RRT)    at     Pt transferred to 3M09.        Carla Compton

## 2019-04-19 NOTE — CV Procedure (Signed)
    Transesophageal Echocardiogram Note  Elif Ivers [id number] Nov 02, 1980  Procedure: Transesophageal Echocardiogram Indications: bacteremia   Procedure Details Consent: Obtained Time Out: Verified patient identification, verified procedure, site/side was marked, verified correct patient position, special equipment/implants available, Radiology Safety Procedures followed,  medications/allergies/relevent history reviewed, required imaging and test results available.  Performed  Medications:  During this procedure the patient is administered a Propofol drip .  Total propofol for procdure was 120 mg IV .   She also received Phenylephrine 280 mcg IV and Ephedrine 15 mg IV .  Left Ventrical:  Normal LV functon   Mitral Valve: normal valve,  No vegetations   Aortic Valve: normal , no vegetation   Tricuspid Valve: normal , no vegetation.   Mild TR   Pulmonic Valve: normal   Left Atrium/ Left atrial appendage: no thrombi   Atrial septum: no ASD or PFO by color doppler   Aorta: normal .    Complications: No apparent complications Patient did tolerate procedure well.   Thayer Headings, Brooke Bonito., MD, [facility] 04/04/2019, 1:29 PM

## 2019-04-19 NOTE — Progress Notes (Signed)
PT Cancellation Note  Patient Details Name: Carla Compton MRN: [id number] DOB: August 10, 1980   Cancelled Treatment:    Reason Eval/Treat Not Completed: (P) Patient at procedure or test/unavailable Pt s/p TEE. PT will follow back for evaluation tomorrow.  Bakari Nikolai B. Migdalia Dk PT, DPT Acute Rehabilitation Services Pager [phone] Office [phone]    [place] 04/17/2019, 2:47 PM

## 2019-04-19 NOTE — Transfer of Care (Signed)
Immediate Anesthesia Transfer of Care Note  Patient: Joanmarie Tsang  Procedure(s) Performed: TRANSESOPHAGEAL ECHOCARDIOGRAM (TEE) (N/A )  Patient Location: Endoscopy Unit  Anesthesia Type:MAC  Level of Consciousness: drowsy and patient cooperative  Airway & Oxygen Therapy: Patient Spontanous Breathing and Patient connected to nasal cannula oxygen  Post-op Assessment: Report given to RN, Post -op Vital signs reviewed and stable and Patient moving all extremities X 4  Post vital signs: Reviewed and stable  Last Vitals:  Vitals Value Taken Time  BP    Temp    Pulse    Resp    SpO2      Last Pain:  Vitals:   03/28/2019 1256  TempSrc: Temporal  PainSc: 9       Patients Stated Pain Goal: 0 (23/55/73 2202)  Complications: No apparent anesthesia complications

## 2019-04-19 NOTE — Consult Note (Signed)
[facility]    Date of Admission:  03/29/2019      Total days of antibiotics 8  vanocmycin                 Reason for Consult: MRSA bacteremia     Referring Provider: Karleen Hampshire  Primary Care Provider: Abigail Miyamoto, FNP     Assessment: Carla Compton is a 38 y.o. female transferred to [facility] from [facility] in [place], [place] for assistance in managing persistent MRSA bacteremia. She was initially admitted on 10/18 for fevers/chills and AMS. Work up revealed MRSA bacteremia on 10/18 in 4/4 bottles; again positive 10/19.   Unfortunately her central line placed acutely for fluids/pressor support in R IJ on 10/18. This will need to be pulled as it is considered contaminated in the setting of untreated bacteremia. Her TEE was negative and previous thoracolumbar imaging was unremarkable. She has a great deal of right hip pain and unable to raise leg up off bed. Would recommend imaging R hip with MRI w/o contrast if she is able to stay still for this. Alternatively could look at CT scan.    Plan: 1. Continue Vancomycin IV 2. Start PIV and pull central line 3. MRI WO contrast R hip (CT scan if unable to tolerate MR) 4. 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 allopurinol  100 mg Oral BID  . aspirin EC  81 mg Oral Daily  . calcitRIOL  0.5 mcg Oral Q M,W,F-HD  . carvedilol  12.5 mg Oral BID WC  . Chlorhexidine Gluconate Cloth  6 each Topical Daily  . Chlorhexidine Gluconate Cloth  6 each Topical Q0600  . [START ON 04/20/2019] darbepoetin (ARANESP) injection - DIALYSIS  60 mcg Intravenous Q Tue-HD  . feeding supplement (PRO-STAT SUGAR FREE 64)  30 mL Oral BID  . insulin aspart  0-9  Units Subcutaneous TID WC  . multivitamin  1 tablet Oral QHS  . rosuvastatin  40 mg Oral QHS  . sevelamer carbonate  800 mg Oral TID WC    HPI: Carla Compton is a 38 y.o. female with ESRD (new to HD as of March 2020 via L AVF), DVT on Xarelto, T2DM, THN, dCHF, anemia.   Review of [facility] Chart:  Admitted to [facility] with septic shock due to MRSA bacteremia. Positive blood cultures on 10/18 and again 10/19. Intermittently required Levophed for hemodynamic support. She was started on IV vancomycin and transitioned to Zyvox and Meropenem for unknown reason (patient is sleepy and uncertain of what she was on prior to hospitalization).   Transferred to [facility] for further management of her MRSA Bacteremia and TEE.   TEE was done today and read to have normal EF, no valvular dysfunction and no vegetations. Blood cultures on 10/25 without growth so far.   She states that she has been sick for a month now. She has had worsening right sided hip/leg pain that has caused her weakness and inability to ambulate prior to admission.  CT scans at outside hospital of thoracolumbar spine were reportedly negative.   Central line was placed 10/18 while still bacteremic.    Review of  Systems: Review of Systems  Constitutional: Negative for chills and fever.  HENT: Negative for tinnitus.   Eyes: Negative for blurred vision and photophobia.  Respiratory: Negative for cough and sputum production.   Cardiovascular: Negative for chest pain.  Gastrointestinal: Negative for diarrhea, nausea and vomiting.  Genitourinary: Negative for dysuria.  Musculoskeletal: Positive for joint pain (r hip ).  Skin: Negative for rash.  Neurological: Positive for focal weakness (r hip/leg). Negative for headaches.    Past Medical History:  Diagnosis Date  . Anemia    2019  . Benign essential HTN   . CHF (congestive heart failure) ([place])   . CKD (chronic kidney disease) stage 3, GFR 30-59 ml/min    . Diabetes [facility])     Social History   Tobacco Use  . Smoking status: Never Smoker  . Smokeless tobacco: Never Used  Substance Use Topics  . Alcohol use: Never    Frequency: Never  . Drug use: Never    Family History  Problem Relation Age of Onset  . Mitral valve prolapse Mother   . Hypertension Mother   . Hypertension Father   . Diabetes Father   . Diabetes Maternal Grandmother    Allergies  Allergen Reactions  . Contrast Media [Iodinated Diagnostic Agents] Other (See Comments)    Affected her kidneys  . Pepto-Bismol [Bismuth] Other (See Comments)    Unknown reaction    OBJECTIVE: Blood pressure 98/67, pulse 81, temperature 97.6 F (36.4 C), temperature source Oral, resp. rate 17, height 5\' 2"  (1.575 m), weight 110 kg, last menstrual period 04/02/2019, SpO2 97 %.  Physical Exam Constitutional:      Appearance: She is well-developed.     Comments: Resting quietly in bed. Sleepy from TEE.   HENT:     Mouth/Throat:     Mouth: Mucous membranes are dry. No oral lesions.     Dentition: Normal dentition. No dental abscesses.     Pharynx: No oropharyngeal exudate.  Eyes:     General: No scleral icterus.    Pupils: Pupils are equal, round, and reactive to light.  Neck:     Musculoskeletal: Normal range of motion.  Cardiovascular:     Rate and Rhythm: Normal rate and regular rhythm.     Heart sounds: Normal heart sounds. No murmur.  Pulmonary:     Effort: Pulmonary effort is normal.     Breath sounds: Normal breath sounds.  Abdominal:     General: There is no distension.     Palpations: Abdomen is soft.     Tenderness: There is no abdominal tenderness.  Musculoskeletal:        General: Tenderness (R hip joint tenderness. No erythema/warmth. Pain with passive leg raise.) present. No swelling.  Lymphadenopathy:     Cervical: No cervical adenopathy.  Skin:    General: Skin is warm and dry.     Capillary Refill: Capillary refill takes less than 2 seconds.      Findings: No rash.     Comments: L AVF +bruit/thrill w/o fluctuance or warmth/erythema  Neurological:     Mental Status: She is alert and oriented to person, place, and time.  Psychiatric:        Judgment: Judgment normal.     Comments: In good spirits today and engaged in care discussion     Lab Results Lab Results  Component Value Date   WBC 12.8 (H) 04/13/2019   HGB 10.2 (L) 04/03/2019   HCT 32.4 (L) 04/03/2019   MCV  94.2 04/11/2019   PLT 230 04/12/2019    Lab Results  Component Value Date   CREATININE 7.82 (H) 04/01/2019   BUN 61 (H) 04/24/2019   NA 133 (L) 03/27/2019   K 4.8 04/22/2019   CL 93 (L) 03/28/2019   CO2 23 03/25/2019    Lab Results  Component Value Date   ALT 14 04/18/2019   AST 25 04/20/2019   ALKPHOS 254 (H) 04/03/2019   BILITOT 3.7 (H) 04/13/2019     Microbiology: Recent Results (from the past 240 hour(s))  SARS CORONAVIRUS 2 (TAT 6-24 HRS) Nasopharyngeal Nasopharyngeal Swab     Status: None   Collection Time: 04/17/19 12:27 AM   Specimen: Nasopharyngeal Swab  Result Value Ref Range Status   SARS Coronavirus 2 NEGATIVE NEGATIVE Final    Comment: (NOTE) SARS-CoV-2 target nucleic acids are NOT DETECTED. The SARS-CoV-2 RNA is generally detectable in upper and lower respiratory specimens during the acute phase of infection. Negative results do not preclude SARS-CoV-2 infection, do not rule out co-infections with other pathogens, and should not be used as the sole basis for treatment or other patient management decisions. Negative results must be combined with clinical observations, patient history, and epidemiological information. The expected result is Negative. Fact Sheet for Patients: SugarRoll.be Fact Sheet for Healthcare Providers: https://www.woods-mathews.com/ This test is not yet approved or cleared by the [place] FDA and  has been authorized for detection and/or diagnosis of SARS-CoV-2 by  FDA under an Emergency Use Authorization (EUA). This EUA will remain  in effect (meaning this test can be used) for the duration of the COVID-19 declaration under Section 56 4(b)(1) of the Act, 21 U.S.C. section 360bbb-3(b)(1), unless the authorization is terminated or revoked sooner. Performed at [facility], [place] [address] [place], [place] [address]   Culture, blood (routine x 2)     Status: None (Preliminary result)   Collection Time: 04/18/19  8:29 AM   Specimen: BLOOD RIGHT HAND  Result Value Ref Range Status   Specimen Description BLOOD RIGHT HAND  Final   Special Requests   Final    AEROBIC BOTTLE ONLY Blood Culture results may not be optimal due to an inadequate volume of blood received in culture bottles   Culture   Final    NO GROWTH < 24 HOURS Performed at [facility], [place] [address], [place], [place] [address]    Report Status PENDING  Incomplete  Culture, blood (routine x 2)     Status: None (Preliminary result)   Collection Time: 04/18/19  5:41 PM   Specimen: BLOOD RIGHT HAND  Result Value Ref Range Status   Specimen Description BLOOD RIGHT HAND  Final   Special Requests   Final    AEROBIC BOTTLE ONLY Blood Culture results may not be optimal due to an inadequate volume of blood received in culture bottles   Culture   Final    NO GROWTH < 24 HOURS Performed at [facility], [place] [address], [place],  [address]    Report Status PENDING  Incomplete  MRSA PCR Screening     Status: Abnormal   Collection Time: 04/21/2019  9:26 AM   Specimen: Nasopharyngeal  Result Value Ref Range Status   MRSA by PCR POSITIVE (A) NEGATIVE Final    Comment:        The GeneXpert MRSA Assay (FDA approved for NASAL specimens only), is one component of a comprehensive MRSA colonization surveillance program. It is not  intended to diagnose MRSA infection nor to guide or monitor treatment for MRSA infections. RESULT CALLED TO, READ BACK BY AND VERIFIED WITH:  Jefferson Fuel RN 12:05 04/11/2019 ([email or web address]) Performed at [facility], [place] [address] [place], [place] [address]      Janene Madeira, MSN, NP-C [facility] [email or web address] Pager: [phone] Office: [phone] [place]: [phone]

## 2019-04-19 NOTE — Progress Notes (Signed)
  Echocardiogram Echocardiogram Transesophageal has been performed.  Nnamdi Dacus L Androw 04/21/2019, 2:24 PM

## 2019-04-19 NOTE — Progress Notes (Addendum)
[facility]    NEPHROLOGY PROGRESS NOTE  SUBJECTIVE: Patient seen and examined.  Complaining of being hungry.  Denies any chest pain, shortness of breath, nausea or vomiting.  Is drowsy on examination.  All other review of systems are negative.   OBJECTIVE:  Vitals:   04/24/2019 1355 04/10/2019 1453  BP: (!) 100/48 98/67  Pulse: 81   Resp: 13 17  Temp:  97.6 F (36.4 C)  SpO2: 100% 97%    Intake/Output Summary (Last 24 hours) at 04/22/2019 1602 Last data filed at 04/16/2019 1453 Gross per 24 hour  Intake 921.29 ml  Output 0 ml  Net 921.29 ml      General:  AAOx3 NAD HEENT: MMM [place] AT anicteric sclera Neck:  No JVD, no adenopathy CV:  Heart RRR  Lungs:  L/S CTA bilaterally Abd:  abd SNT/ND with normal BS GU:  Bladder non-palpable Extremities:  No LE edema. Skin:  No skin rash  MEDICATIONS:  . allopurinol  100 mg Oral BID  . aspirin EC  81 mg Oral Daily  . calcitRIOL  0.5 mcg Oral Q M,W,F-HD  . carvedilol  25 mg Oral BID WC  . Chlorhexidine Gluconate Cloth  6 each Topical Daily  . Chlorhexidine Gluconate Cloth  6 each Topical Q0600  . [START ON 04/20/2019] darbepoetin (ARANESP) injection - DIALYSIS  60 mcg Intravenous Q Tue-HD  . feeding supplement (PRO-STAT SUGAR FREE 64)  30 mL Oral BID  . hydrALAZINE  25 mg Oral Q8H  . insulin aspart  0-9 Units Subcutaneous TID WC  . multivitamin  1 tablet Oral QHS  . rosuvastatin  40 mg Oral QHS       LABS:   CBC Latest Ref Rng & Units 04/14/2019 04/18/2019 04/17/2019  WBC 4.0 - 10.5 K/uL 12.8(H) 12.0(H) 9.5  Hemoglobin 12.0 - 15.0 g/dL 10.2(L) 9.9(L) 10.5(L)  Hematocrit 36.0 - 46.0 % 32.4(L) 31.0(L) 33.1(L)  Platelets 150 - 400 K/uL 230 242 232    CMP Latest Ref Rng & Units 04/07/2019 04/17/2019 08/16/2018  Glucose 70 - 99 mg/dL 116(H) 92 125(H)  BUN 6 - 20 mg/dL 61(H) 38(H) 44(H)  Creatinine 0.44 - 1.00 mg/dL 7.82(H) 5.45(H) 4.27(H)  Sodium 135 - 145 mmol/L 133(L) 134(L) 136  Potassium 3.5 - 5.1  mmol/L 4.8 3.7 4.2  Chloride 98 - 111 mmol/L 93(L) 94(L) 110  CO2 22 - 32 mmol/L 23 27 19(L)  Calcium 8.9 - 10.3 mg/dL 7.7(L) 8.7(L) 8.0(L)  Total Protein 6.5 - 8.1 g/dL 6.3(L) 6.3(L) -  Total Bilirubin 0.3 - 1.2 mg/dL 3.7(H) 3.7(H) -  Alkaline Phos 38 - 126 U/L 254(H) 145(H) -  AST 15 - 41 U/L 25 16 -  ALT 0 - 44 U/L 14 15 -    Lab Results  Component Value Date   PTH 48 06/09/2018   CALCIUM 7.7 (L) 04/23/2019   PHOS 5.4 (H) 04/17/2019    No results found for: COLORURINE, APPEARANCEUR, LABSPEC, PHURINE, GLUCOSEU, HGBUR, BILIRUBINUR, KETONESUR, PROTEINUR, UROBILINOGEN, NITRITE, LEUKOCYTESUR No results found for: PHART, PCO2ART, PO2ART, HCO3, TCO2, ACIDBASEDEF, O2SAT     Component Value Date/Time   IRON 50 04/18/2019 1700   TIBC 116 (L) 04/18/2019 1700   FERRITIN 2,589 (H) 04/18/2019 1700   IRONPCTSAT 43 (H) 04/18/2019 1700       ASSESSMENT/PLAN:    1. MRSA bacteremia- persistent without obvious source -repeat BC pending - ID consulted and TEE ordered. She did have a prior MRSA hand wound 07/2018. 2. ESRD- MWF -  next HD today- has had some problems clotting off mid treatment - evaluate function on HD Monday - if problems can refer to IR while here - work dialysis around TEE  3. Hypertension/volume-we will reduce carvedilol and DC hydralazine 4. Anemia- hgb 10.5 > 9.9  Start Aranesp 60 Tuesday  5. Metabolic bone disease- Continue calcitriol with HD.  Add phosphate binder. 6. Nutrition - alb low - renal carb mod diet - add protein suppl 7. Transient confusion and agitation during prior acute admission prior to transfer to [facility] groggier today than yesterday possibly related to low blood pressure. 8. DM/gout/GERD -meds per primary 9. Rheumatoid arthritis - 10. Recently diagnosed RLE DVT 04/11/19 - heparin drop for now 11. Elevated T. Bili - present at transfer    [employer], DO, FACP

## 2019-04-19 NOTE — Progress Notes (Signed)
ANTICOAGULATION CONSULT NOTE - Follow Up Consult  Pharmacy Consult for Heparin Indication: DVT  Allergies  Allergen Reactions  . Contrast Media [Iodinated Diagnostic Agents] Other (See Comments)    Affected her kidneys  . Pepto-Bismol [Bismuth] Other (See Comments)    Unknown reaction    Patient Measurements: Height: 5\' 2"  (157.5 cm) Weight: 242 lb 8.1 oz (110 kg) IBW/kg (Calculated) : 50.1 Heparin Dosing Weight: 77.3 kg  Vital Signs: Temp: 97.9 F (36.6 C) (10/26 0800) Temp Source: Oral (10/26 0800) BP: 96/57 (10/26 0800) Pulse Rate: 83 (10/26 0800)  Labs: Recent Labs    04/17/19 0320  04/17/19 2006 04/18/19 0224 04/18/19 0809 04/18/19 1700 04/06/2019 0249  HGB 10.5*  --   --  9.9*  --   --  10.2*  HCT 33.1*  --   --  31.0*  --   --  32.4*  PLT 232  --   --  242  --   --  230  APTT  --    < > 44*  --  40* 65* 79*  HEPARINUNFRC  --   --  1.54*  --  0.97*  --  0.90*  CREATININE 5.45*  --   --   --   --   --  7.82*   < > = values in this interval not displayed.    Estimated Creatinine Clearance: 11.6 mL/min (A) (by C-G formula based on SCr of 7.82 mg/dL (H)).   Assessment:  Anticoag: Heparin drip for RLE DVT, was on Xarelto at OSH,  LD 10/23. Resume on discharge - aPTT 79 in goal, HL remains elevated due to Xarelto interaction. Hgb 10.2 stable. Plts 230 stable.  Goal of Therapy:  aPTT 66-102 seconds Monitor platelets by anticoagulation protocol: Yes   Plan:  IV heparin 1800 units/hr Daily HL, aPTT, CBC F/U med hx and resuming meds pending mental status   Creedence Kunesh S. Alford Highland, PharmD, BCPS Clinical Staff Pharmacist [phone] Eilene Ghazi Stillinger 04/07/2019,9:31 AM

## 2019-04-19 NOTE — Progress Notes (Signed)
CRITICAL VALUE ALERT  Critical Value:  MRSA PCR positive  Date & Time Notied:  04/06/2019; 12:03 o'clock  Provider Notified: Dr. Karleen Hampshire  Orders Received/Actions taken: see MAR

## 2019-04-19 NOTE — Progress Notes (Signed)
HD was called and asked if the patient will have her treatment today. Per dialysis staff, patient will be able to have the treatment late in the afternoon. Vancomycin IV was hold and rescheduled for 8 PM. Will continue to monitor.

## 2019-04-19 NOTE — Interval H&P Note (Signed)
History and Physical Interval Note:  04/08/2019 11:48 AM  Carla Compton  has presented today for surgery, with the diagnosis of bacteremia.  The various methods of treatment have been discussed with the patient and family. After consideration of risks, benefits and other options for treatment, the patient has consented to  Procedure(s): TRANSESOPHAGEAL ECHOCARDIOGRAM (TEE) (N/A) as a surgical intervention.  The patient's history has been reviewed, patient examined, no change in status, stable for surgery.  I have reviewed the patient's chart and labs.  Questions were answered to the patient's satisfaction.     Mertie Moores

## 2019-04-19 NOTE — Anesthesia Preprocedure Evaluation (Signed)
Anesthesia Evaluation  Patient identified by MRN, date of birth, ID band Patient awake    Reviewed: Allergy & Precautions, H&P , NPO status , Patient's Chart, lab work & pertinent test results  Airway Mallampati: II   Neck ROM: full    Dental   Pulmonary neg pulmonary ROS,    breath sounds clear to auscultation       Cardiovascular hypertension, +CHF   Rhythm:regular Rate:Normal     Neuro/Psych  Neuromuscular disease    GI/Hepatic   Endo/Other  diabetesMorbid obesity  Renal/GU Renal InsufficiencyRenal disease     Musculoskeletal  (+) Arthritis ,   Abdominal   Peds  Hematology  (+) Blood dyscrasia, anemia ,   Anesthesia Other Findings   Reproductive/Obstetrics                             Anesthesia Physical Anesthesia Plan  ASA: III  Anesthesia Plan: MAC   Post-op Pain Management:    Induction: Intravenous  PONV Risk Score and Plan: 2 and Propofol infusion and Treatment may vary due to age or medical condition  Airway Management Planned: Nasal Cannula  Additional Equipment:   Intra-op Plan:   Post-operative Plan:   Informed Consent: I have reviewed the patients History and Physical, chart, labs and discussed the procedure including the risks, benefits and alternatives for the proposed anesthesia with the patient or authorized representative who has indicated his/her understanding and acceptance.       Plan Discussed with: CRNA, Anesthesiologist and Surgeon  Anesthesia Plan Comments:         Anesthesia Quick Evaluation

## 2019-04-19 NOTE — Progress Notes (Signed)
PROGRESS NOTE    Carla Compton  WUG:[id number] DOB: 1981/05/27 DOA: 04/15/2019 PCP: Abigail Miyamoto, FNP    Brief Narrative:  38 year old lady with prior history of end-stage renal disease with schedule on Monday Wednesday Friday recently started on dialysis, recently diagnosed right lower extremity DVT on Xarelto, type 2 diabetes mellitus, hypertension, diastolic heart failure, chronic anemia initially admitted to [facility] for septic shock, briefly required pressors and was found to have MRSA bacteremia.  She was started on IV vancomycin and later on transition to Zyvox and meropenem for unknown reasons.  During her hospitalization at [facility] patient was agitated and confused and an initial CT of the head did not show any acute intracranial findings.  She was transferred to [facility] after discussing with infectious disease consultant Dr. Baxter Flattery.  After arrival to [facility] we consulted cardiology for TEE. TEE to be scheduled next week by cardiology.  Assessment & Plan:   Principal Problem:   MRSA bacteremia Active Problems:   Uncontrolled secondary diabetes mellitus with stage 4 CKD (GFR 15-29) (HCC)   Normocytic anemia   Idiopathic chronic gout of multiple sites without tophus   ESRD (end stage renal disease) (HCC)   Bacteremia    MRSA bacteremia:  With unclear source Patient started on IV vancomycin and repeat blood cultures have been ordered, have been negative so far.   TTE apparently did not show any endocarditis.  She is scheduled for TEE today. Pending recommendations from ID.    End-stage renal disease on hemodialysis Patient schedule days on Monday Wednesday and Friday.  Further management as per nephrology.   Essential hypertension Well-controlled Continue with Coreg and hydralazine.    Mild acute encephalopathy suspect metabolic. Patient is very lethargic today but she was able to follow all commands and answer all  questions appropriately. Initial CT of the head does not show any intracranial abnormalities. MRI of the brain without contrast ordered for further evaluation. EEG ordered and pending.  Right lower extremity DVT Continue with IV heparin for now we will plan to transition to Xarelto on discharge.    Anemia of chronic disease/anemia secondary to chronic renal failure Transfuse to keep hemoglobin greater than 7.  Hemoglobin around 10.   Anemia panel reviewed iron levels adequate, ferritin is greater than 2000, vitamin B12 greater than 1200.    Type 2 diabetes mellitus CBG (last 3)  Recent Labs    04/18/19 2232 04/18/2019 0813 04/11/2019 1239  GLUCAP 119* 106* 95   Continue with sliding scale insulin no changes in her medications Hemoglobin A1c 6.9  Elevated alk phos and mildly elevated total bilirubin, AST and ALT within normal limits.  Abdominal exam benign Ultrasound abdomen will be ordered for further evaluation.    Leukocytosis Probably reactive Differential will be added to the sample today.   Hyperlipidemia  continue with Crestor at this time.   DVT prophylaxis: IV heparin Code Status: Full Family Communication: none at bedside.  Discussed with mother over the phone on 04/18/19 Disposition Plan: Pending clinical improvement and further evaluation   Consultants:   Nephrology  Cardiology  Infectious disease   Procedures: TEE on 04/22/2019  Antimicrobials: IV VANCOMYCIN since admission  Subjective: Patient is lethargic but is able to follow commands and answer all questions appropriately we will request PT and OT evaluations  Objective: Vitals:   04/09/2019 0638 04/23/2019 0701 03/29/2019 0800 04/04/2019 1256  BP:  (!) 94/52 (!) 96/57 (!) 90/47  Pulse:   83 81  Resp:   18 17  Temp:   97.9 F (36.6 C) 98.6 F (37 C)  TempSrc:   Oral Temporal  SpO2:   100% 99%  Weight: 110 kg     Height:        Intake/Output Summary (Last 24 hours) at 04/10/2019 1316  Last data filed at 04/20/2019 1247 Gross per 24 hour  Intake 866.29 ml  Output 0 ml  Net 866.29 ml   Filed Weights   04/17/19 0521 04/18/19 0517 03/30/2019 1610  Weight: 112.6 kg 112 kg 110 kg    Examination:  General exam: Lethargic but comfortable not in any kind of distress Respiratory system: Air entry fair, no wheezing or rhonchi Cardiovascular system: S1-S2 heard, regular rhythm Gastrointestinal system: Soft, nontender, nondistended, bowel sounds are normal Central nervous system: Lethargic but follows all commands and answers all questions appropriately.   Extremities: Trace pedal edema and tender lower extremities Skin: No rashes or ulcers Psychiatry: Lethargic,  no agitation seen.  Data Reviewed: I have personally reviewed following labs and imaging studies  CBC: Recent Labs  Lab 04/17/19 0320 04/18/19 0224 04/10/2019 0249  WBC 9.5 12.0* 12.8*  NEUTROABS 7.2  --   --   HGB 10.5* 9.9* 10.2*  HCT 33.1* 31.0* 32.4*  MCV 94.6 94.2 94.2  PLT 232 242 960   Basic Metabolic Panel: Recent Labs  Lab 04/17/19 0320 04/08/2019 0249  NA 134* 133*  K 3.7 4.8  CL 94* 93*  CO2 27 23  GLUCOSE 92 116*  BUN 38* 61*  CREATININE 5.45* 7.82*  CALCIUM 8.7* 7.7*  MG 2.0  --   PHOS 5.4*  --    GFR: Estimated Creatinine Clearance: 11.6 mL/min (A) (by C-G formula based on SCr of 7.82 mg/dL (H)). Liver Function Tests: Recent Labs  Lab 04/17/19 0320 04/21/2019 0249  AST 16 25  ALT 15 14  ALKPHOS 145* 254*  BILITOT 3.7* 3.7*  PROT 6.3* 6.3*  ALBUMIN 1.7* 1.7*   No results for input(s): LIPASE, AMYLASE in the last 168 hours. No results for input(s): AMMONIA in the last 168 hours. Coagulation Profile: No results for input(s): INR, PROTIME in the last 168 hours. Cardiac Enzymes: No results for input(s): CKTOTAL, CKMB, CKMBINDEX, TROPONINI in the last 168 hours. BNP (last 3 results) No results for input(s): PROBNP in the last 8760 hours. HbA1C: Recent Labs    04/17/19  0320  HGBA1C 6.9*   CBG: Recent Labs  Lab 04/18/19 1151 04/18/19 1608 04/18/19 2232 04/18/2019 0813 04/12/2019 1239  GLUCAP 107* 127* 119* 106* 95   Lipid Profile: No results for input(s): CHOL, HDL, LDLCALC, TRIG, CHOLHDL, LDLDIRECT in the last 72 hours. Thyroid Function Tests: Recent Labs    04/17/19 0320  TSH 3.334   Anemia Panel: Recent Labs    04/18/19 1700  VITAMINB12 1,205*  FOLATE 10.1  FERRITIN 2,589*  TIBC 116*  IRON 50  RETICCTPCT 1.0   Sepsis Labs: No results for input(s): PROCALCITON, LATICACIDVEN in the last 168 hours.  Recent Results (from the past 240 hour(s))  SARS CORONAVIRUS 2 (TAT 6-24 HRS) Nasopharyngeal Nasopharyngeal Swab     Status: None   Collection Time: 04/17/19 12:27 AM   Specimen: Nasopharyngeal Swab  Result Value Ref Range Status   SARS Coronavirus 2 NEGATIVE NEGATIVE Final    Comment: (NOTE) SARS-CoV-2 target nucleic acids are NOT DETECTED. The SARS-CoV-2 RNA is generally detectable in upper and lower respiratory specimens during the acute phase of infection. Negative results do not  preclude SARS-CoV-2 infection, do not rule out co-infections with other pathogens, and should not be used as the sole basis for treatment or other patient management decisions. Negative results must be combined with clinical observations, patient history, and epidemiological information. The expected result is Negative. Fact Sheet for Patients: SugarRoll.be Fact Sheet for Healthcare Providers: https://www.woods-mathews.com/ This test is not yet approved or cleared by the [place] FDA and  has been authorized for detection and/or diagnosis of SARS-CoV-2 by FDA under an Emergency Use Authorization (EUA). This EUA will remain  in effect (meaning this test can be used) for the duration of the COVID-19 declaration under Section 56 4(b)(1) of the Act, 21 U.S.C. section 360bbb-3(b)(1), unless the authorization is  terminated or revoked sooner. Performed at [facility], [place] [address] [place], [place] [address]   Culture, blood (routine x 2)     Status: None (Preliminary result)   Collection Time: 04/18/19  8:29 AM   Specimen: BLOOD RIGHT HAND  Result Value Ref Range Status   Specimen Description BLOOD RIGHT HAND  Final   Special Requests   Final    AEROBIC BOTTLE ONLY Blood Culture results may not be optimal due to an inadequate volume of blood received in culture bottles   Culture   Final    NO GROWTH < 24 HOURS Performed at [facility], [place] [address] [place], [place] [address]    Report Status PENDING  Incomplete  Culture, blood (routine x 2)     Status: None (Preliminary result)   Collection Time: 04/18/19  5:41 PM   Specimen: BLOOD RIGHT HAND  Result Value Ref Range Status   Specimen Description BLOOD RIGHT HAND  Final   Special Requests   Final    AEROBIC BOTTLE ONLY Blood Culture results may not be optimal due to an inadequate volume of blood received in culture bottles   Culture   Final    NO GROWTH < 24 HOURS Performed at [facility], [place] [address] [place], [place] [address]    Report Status PENDING  Incomplete  MRSA PCR Screening     Status: Abnormal   Collection Time: 04/11/2019  9:26 AM   Specimen: Nasopharyngeal  Result Value Ref Range Status   MRSA by PCR POSITIVE (A) NEGATIVE Final    Comment:        The GeneXpert MRSA Assay (FDA approved for NASAL specimens only), is one component of a comprehensive MRSA colonization surveillance program. It is not intended to diagnose MRSA infection nor to guide or monitor treatment for MRSA infections. RESULT CALLED TO, READ BACK BY AND VERIFIED WITH: Jefferson Fuel RN 12:05 04/09/2019 ([email or web address]) Performed at [facility], [place] [address] [place], [place] [address]          Radiology Studies: No results found.      Scheduled Meds: . [MAR Hold] allopurinol  100 mg Oral BID  . [MAR Hold]  aspirin EC  81 mg Oral Daily  . [MAR Hold] calcitRIOL  0.5 mcg Oral Q M,W,F-HD  . [MAR Hold] carvedilol  25 mg Oral BID WC  . [MAR Hold] Chlorhexidine Gluconate Cloth  6 each Topical Daily  . [MAR Hold] Chlorhexidine Gluconate Cloth  6 each Topical Q0600  . [MAR Hold] darbepoetin (ARANESP) injection - DIALYSIS  60 mcg Intravenous Q Tue-HD  . [MAR Hold] feeding supplement (PRO-STAT SUGAR FREE 64)  30 mL Oral BID  . [MAR Hold] hydrALAZINE  25 mg Oral  Q8H  . [MAR Hold] insulin aspart  0-9 Units Subcutaneous TID WC  . [MAR Hold] multivitamin  1 tablet Oral QHS  . [MAR Hold] rosuvastatin  40 mg Oral QHS   Continuous Infusions: . heparin 1,800 Units/hr (04/04/2019 0733)  . [MAR Hold] vancomycin       LOS: 3 days        Hosie Poisson, MD [facility] Pager [phone]  If 7PM-7AM, please contact night-coverage [email or web address] Password [email or web address] 04/18/2019, 1:16 PM

## 2019-04-20 ENCOUNTER — Encounter (HOSPITAL_COMMUNITY): Payer: Self-pay | Admitting: Radiology

## 2019-04-20 ENCOUNTER — Inpatient Hospital Stay (HOSPITAL_COMMUNITY): Payer: BLUE CROSS/BLUE SHIELD

## 2019-04-20 DIAGNOSIS — R7881 Bacteremia: Secondary | ICD-10-CM | POA: Diagnosis not present

## 2019-04-20 DIAGNOSIS — R0902 Hypoxemia: Secondary | ICD-10-CM | POA: Diagnosis not present

## 2019-04-20 DIAGNOSIS — R197 Diarrhea, unspecified: Secondary | ICD-10-CM

## 2019-04-20 DIAGNOSIS — G9340 Encephalopathy, unspecified: Secondary | ICD-10-CM

## 2019-04-20 DIAGNOSIS — E8770 Fluid overload, unspecified: Secondary | ICD-10-CM | POA: Diagnosis not present

## 2019-04-20 DIAGNOSIS — E872 Acidosis: Secondary | ICD-10-CM

## 2019-04-20 DIAGNOSIS — A419 Sepsis, unspecified organism: Secondary | ICD-10-CM

## 2019-04-20 DIAGNOSIS — B9562 Methicillin resistant Staphylococcus aureus infection as the cause of diseases classified elsewhere: Secondary | ICD-10-CM | POA: Diagnosis not present

## 2019-04-20 LAB — COMPREHENSIVE METABOLIC PANEL
ALT: 16 U/L (ref 0–44)
AST: 46 U/L — ABNORMAL HIGH (ref 15–41)
Albumin: 1.7 g/dL — ABNORMAL LOW (ref 3.5–5.0)
Alkaline Phosphatase: 393 U/L — ABNORMAL HIGH (ref 38–126)
Anion gap: 17 — ABNORMAL HIGH (ref 5–15)
BUN: 74 mg/dL — ABNORMAL HIGH (ref 6–20)
CO2: 18 mmol/L — ABNORMAL LOW (ref 22–32)
Calcium: 7.2 mg/dL — ABNORMAL LOW (ref 8.9–10.3)
Chloride: 96 mmol/L — ABNORMAL LOW (ref 98–111)
Creatinine, Ser: 8.83 mg/dL — ABNORMAL HIGH (ref 0.44–1.00)
GFR calc Af Amer: 6 mL/min — ABNORMAL LOW (ref >60)
GFR calc non Af Amer: 5 mL/min — ABNORMAL LOW (ref >60)
Glucose, Bld: 147 mg/dL — ABNORMAL HIGH (ref 70–99)
Potassium: 5.3 mmol/L — ABNORMAL HIGH (ref 3.5–5.1)
Sodium: 131 mmol/L — ABNORMAL LOW (ref 135–145)
Total Bilirubin: 3.7 mg/dL — ABNORMAL HIGH (ref 0.3–1.2)
Total Protein: 6 g/dL — ABNORMAL LOW (ref 6.5–8.1)

## 2019-04-20 LAB — RENAL FUNCTION PANEL
Albumin: 1.5 g/dL — ABNORMAL LOW (ref 3.5–5.0)
Anion gap: 19 — ABNORMAL HIGH (ref 5–15)
BUN: 74 mg/dL — ABNORMAL HIGH (ref 6–20)
CO2: 13 mmol/L — ABNORMAL LOW (ref 22–32)
Calcium: 6.7 mg/dL — ABNORMAL LOW (ref 8.9–10.3)
Chloride: 98 mmol/L (ref 98–111)
Creatinine, Ser: 8.71 mg/dL — ABNORMAL HIGH (ref 0.44–1.00)
GFR calc Af Amer: 6 mL/min — ABNORMAL LOW (ref >60)
GFR calc non Af Amer: 5 mL/min — ABNORMAL LOW (ref >60)
Glucose, Bld: 113 mg/dL — ABNORMAL HIGH (ref 70–99)
Phosphorus: 9.4 mg/dL — ABNORMAL HIGH (ref 2.5–4.6)
Potassium: 6.7 mmol/L (ref 3.5–5.1)
Sodium: 130 mmol/L — ABNORMAL LOW (ref 135–145)

## 2019-04-20 LAB — POCT I-STAT 7, (LYTES, BLD GAS, ICA,H+H)
Acid-base deficit: 5 mmol/L — ABNORMAL HIGH (ref 0.0–2.0)
Bicarbonate: 23.1 mmol/L (ref 20.0–28.0)
Calcium, Ion: 0.99 mmol/L — ABNORMAL LOW (ref 1.15–1.40)
HCT: 33 % — ABNORMAL LOW (ref 36.0–46.0)
Hemoglobin: 11.2 g/dL — ABNORMAL LOW (ref 12.0–15.0)
O2 Saturation: 94 %
Patient temperature: 98.6
Potassium: 5 mmol/L (ref 3.5–5.1)
Sodium: 130 mmol/L — ABNORMAL LOW (ref 135–145)
TCO2: 25 mmol/L (ref 22–32)
pCO2 arterial: 56.8 mmHg — ABNORMAL HIGH (ref 32.0–48.0)
pH, Arterial: 7.217 — ABNORMAL LOW (ref 7.350–7.450)
pO2, Arterial: 87 mmHg (ref 83.0–108.0)

## 2019-04-20 LAB — CBC
HCT: 33.2 % — ABNORMAL LOW (ref 36.0–46.0)
Hemoglobin: 10.1 g/dL — ABNORMAL LOW (ref 12.0–15.0)
MCH: 29.8 pg (ref 26.0–34.0)
MCHC: 30.4 g/dL (ref 30.0–36.0)
MCV: 97.9 fL (ref 80.0–100.0)
Platelets: 257 10*3/uL (ref 150–400)
RBC: 3.39 MIL/uL — ABNORMAL LOW (ref 3.87–5.11)
RDW: 16.4 % — ABNORMAL HIGH (ref 11.5–15.5)
WBC: 15.3 10*3/uL — ABNORMAL HIGH (ref 4.0–10.5)
nRBC: 0 % (ref 0.0–0.2)

## 2019-04-20 LAB — LACTIC ACID, PLASMA: Lactic Acid, Venous: 0.9 mmol/L (ref 0.5–1.9)

## 2019-04-20 LAB — GLUCOSE, CAPILLARY
Glucose-Capillary: 123 mg/dL — ABNORMAL HIGH (ref 70–99)
Glucose-Capillary: 131 mg/dL — ABNORMAL HIGH (ref 70–99)
Glucose-Capillary: 124 mg/dL — ABNORMAL HIGH (ref 70–99)
Glucose-Capillary: 120 mg/dL — ABNORMAL HIGH (ref 70–99)
Glucose-Capillary: 115 mg/dL — ABNORMAL HIGH (ref 70–99)

## 2019-04-20 LAB — BLOOD GAS, ARTERIAL
Acid-base deficit: 2.6 mmol/L — ABNORMAL HIGH (ref 0.0–2.0)
Bicarbonate: 24.1 mmol/L (ref 20.0–28.0)
Drawn by: 362771
O2 Content: 2 L/min
O2 Saturation: 93.8 %
Patient temperature: 98.6
pCO2 arterial: 60.7 mmHg — ABNORMAL HIGH (ref 32.0–48.0)
pH, Arterial: 7.223 — ABNORMAL LOW (ref 7.350–7.450)
pO2, Arterial: 84.3 mmHg (ref 83.0–108.0)

## 2019-04-20 LAB — HEPARIN LEVEL (UNFRACTIONATED): Heparin Unfractionated: 0.93 IU/mL — ABNORMAL HIGH (ref 0.30–0.70)

## 2019-04-20 LAB — APTT
aPTT: 146 seconds — ABNORMAL HIGH (ref 24–36)
aPTT: 72 seconds — ABNORMAL HIGH (ref 24–36)

## 2019-04-20 LAB — AMMONIA: Ammonia: 23 umol/L (ref 9–35)

## 2019-04-20 MED ORDER — VANCOMYCIN HCL IN DEXTROSE 1-5 GM/200ML-% IV SOLN
1000.0000 mg | INTRAVENOUS | Status: DC
  Administered 2019-04-20 – 2019-04-21 (×2): 1000 mg via INTRAVENOUS
  Filled 2019-04-20 (×2): qty 200

## 2019-04-20 MED ORDER — STERILE WATER FOR INJECTION IV SOLN
INTRAVENOUS | Status: DC
  Administered 2019-04-20 – 2019-04-21 (×2): via INTRAVENOUS
  Filled 2019-04-20 (×2): qty 850

## 2019-04-20 MED ORDER — SODIUM CHLORIDE 0.9 % IV SOLN: 2.0000 g | INTRAVENOUS | Status: DC

## 2019-04-20 MED ORDER — SODIUM CHLORIDE 0.9 % IV BOLUS
1000.0000 mL | Freq: Once | INTRAVENOUS | Status: AC
  Administered 2019-04-20: 1000 mL via INTRAVENOUS

## 2019-04-20 MED ORDER — SODIUM CHLORIDE 0.9 % IV SOLN: 250.0000 mL | INTRAVENOUS | Status: DC

## 2019-04-20 MED ORDER — SODIUM CHLORIDE 0.9 % IV SOLN
2.0000 g | Freq: Once | INTRAVENOUS | Status: AC
  Administered 2019-04-20: 2 g via INTRAVENOUS
  Filled 2019-04-20: qty 2

## 2019-04-20 MED ORDER — FENTANYL CITRATE (PF) 100 MCG/2ML IJ SOLN
12.5000 ug | INTRAMUSCULAR | Status: DC | PRN
  Administered 2019-04-20 – 2019-04-21 (×3): 12.5 ug via INTRAVENOUS
  Filled 2019-04-20 (×3): qty 2

## 2019-04-20 MED ORDER — IOHEXOL 300 MG/ML  SOLN
75.0000 mL | Freq: Once | INTRAMUSCULAR | Status: AC | PRN
  Administered 2019-04-20: 04:00:00 75 mL via INTRAVENOUS

## 2019-04-20 MED ORDER — PRISMASOL BGK 4/2.5 32-4-2.5 MEQ/L IV SOLN
INTRAVENOUS | Status: DC
  Administered 2019-04-20 – 2019-04-22 (×16): via INTRAVENOUS_CENTRAL
  Filled 2019-04-20 (×28): qty 5000

## 2019-04-20 MED ORDER — HEPARIN SODIUM (PORCINE) 1000 UNIT/ML DIALYSIS
1000.0000 [IU] | INTRAMUSCULAR | Status: DC | PRN
  Administered 2019-04-22: 2800 [IU] via INTRAVENOUS_CENTRAL
  Filled 2019-04-20: qty 3
  Filled 2019-04-20 (×2): qty 6

## 2019-04-20 MED ORDER — NOREPINEPHRINE 4 MG/250ML-% IV SOLN
2.0000 ug/min | INTRAVENOUS | Status: DC
  Administered 2019-04-20: 10:00:00 10 ug/min via INTRAVENOUS
  Administered 2019-04-20: 8 ug/min via INTRAVENOUS
  Administered 2019-04-20: 01:00:00 5 ug/min via INTRAVENOUS
  Administered 2019-04-21: 6 ug/min via INTRAVENOUS
  Filled 2019-04-20 (×4): qty 250

## 2019-04-20 MED ORDER — METRONIDAZOLE IN NACL 5-0.79 MG/ML-% IV SOLN
500.0000 mg | Freq: Three times a day (TID) | INTRAVENOUS | Status: DC
  Administered 2019-04-20 – 2019-04-21 (×4): 500 mg via INTRAVENOUS
  Filled 2019-04-20 (×3): qty 100

## 2019-04-20 MED ORDER — CHLORHEXIDINE GLUCONATE CLOTH 2 % EX PADS: 6.0000 | MEDICATED_PAD | Freq: Every day | CUTANEOUS | Status: DC

## 2019-04-20 MED ORDER — SODIUM CHLORIDE 0.9 % FOR CRRT
INTRAVENOUS_CENTRAL | Status: DC | PRN
  Filled 2019-04-20: qty 1000

## 2019-04-20 MED ORDER — PRISMASOL BGK 4/2.5 32-4-2.5 MEQ/L REPLACEMENT SOLN
Status: DC
  Administered 2019-04-20 – 2019-04-21 (×4): via INTRAVENOUS_CENTRAL
  Filled 2019-04-20 (×7): qty 5000

## 2019-04-20 MED ORDER — PRISMASOL BGK 4/2.5 32-4-2.5 MEQ/L REPLACEMENT SOLN
Status: DC
  Administered 2019-04-20 – 2019-04-21 (×4): via INTRAVENOUS_CENTRAL
  Filled 2019-04-20 (×7): qty 5000

## 2019-04-20 MED ORDER — SODIUM CHLORIDE 0.9 % IV SOLN
2.0000 g | Freq: Two times a day (BID) | INTRAVENOUS | Status: DC
  Administered 2019-04-20 (×2): 2 g via INTRAVENOUS
  Filled 2019-04-20 (×2): qty 2

## 2019-04-20 NOTE — H&P (Signed)
NAME:  Carla Compton, MRN:  [id number], DOB:  [birth year], LOS: 4 ADMISSION DATE:  04/13/2019, CONSULTATION DATE:  04/20/19 CHIEF COMPLAINT:  Hypotension, sepsis   Brief History   38 y.o. F with ESRD and DVT who was transferred from 10/24 from [place], [place] for MRSA bacteremia and intermittent confusion of unclear source.  She has had two negative CT head's, Echo and TEE without vegetation and non-specific findings on CXR.   She has been on Vancomycin since admission.   Over the last several hours, BP has been down-trending to SBP 70-80's with increased confusion. On dialysis MWF and per nursing did not get dialysis today secondary to hypotension .  Given 1L NS without improvement therefore PCCM consulted.  History of present illness     Carla Compton is a 38 y.o. F with PMH of recently diagnosed RLE DVT on Xarelto, ESRD, Type 2 DM, Rheumatoid arthritis, HTN and HL who was initially admitted in [place] 10/19 for septic shock.  No clear source was identified, 4/4 BC grew MRSA and she was transferred to [facility] for ID consult and further management.  Per notes,  She was initially on Levophed and Vancomycin; pressors were weaned and abx changed from Vancomycin to Zyvox and meropenem.  She had initial episodes of confusion and had a negative CT head and spine per notes, RLE US showed some fluid collection concerning for cellulitis, no abscess.  Since admission on 10/24 she has been treated with Vancomycin, TEE negative for vegetations.  Repeat BC are pending and no fevers since admission, WBC up-trending from 9.5->13.0.  Overnight, Pt's SBP has been down-trending from baseline 90-100 to as low as 70/43.  RN notes increasing confusion.   On exam, pt is sleeping but arousable to voice.  She appears obtunded and will nod head and give one-word answers.    Past Medical History   Past Medical History:  Diagnosis Date  . Anemia    2019  . Benign essential HTN   . CHF (congestive heart failure) ([place])   .  CKD (chronic kidney disease) stage 3, GFR 30-59 ml/min   . Diabetes ([place])    Rheumatoid Arthritis   Significant Hospital Events   10/24>>Admitted from OSH in [place] 10/27>>Txfr to PCCM   Consults:  Nephrology ID PCCM  Procedures:    Significant Diagnostic Tests:  10/24 CT head>>No acute findings  10/24 CXR>>Cardiomegaly with mild vascular congestion. Pneumonia is not Excluded. 10/27 CT RLE>> MRI head>>pending    Micro Data:  10/26 BC x2>> 10/26 MRSA screen>>positive 10/24 Sars-CoV-2>>neg  Antimicrobials:  Vancomycin 10/24-  Interim history/subjective:  Pt persistently hypotensive despite 1L IVF and ABG shows respiratory acidosis, will transfer to the ICU  Objective   Blood pressure (!) 76/40, pulse 83, temperature 98.7 F (37.1 C), temperature source Oral, resp. rate 19, height 5\' 2"  (1.575 m), weight 110 kg, last menstrual period 04/21/2019, SpO2 97 %. on 3L [place]        Intake/Output Summary (Last 24 hours) at 04/20/2019 0028 Last data filed at 03/30/2019 1832 Gross per 24 hour  Intake 600.2 ml  Output 0 ml  Net 600.2 ml   Filed Weights   04/17/19 0521 04/18/19 0517 04/06/2019 [id number]  Weight: 112.6 kg 112 kg 110 kg    General:  Overweight F, sleeping and not in obvious distress, arousable and non-toxic appearing HEENT: MM pink/moist Neuro: awake and follows basic commands, however appears somewhat obtunded and will only answer yes/no to questions, not otherwise conversational CV: s1s2 RRR,  no m/r/g PULM:  No retractions or accessory muscle use, diminished in the bilateral bases GI: soft, bsx4 active, no TTP Extremities: warm/dry, 1+ edema  Skin: no rashes or lesions   Resolved Hospital Problem list     Assessment & Plan:   Septic Shock secondary to MRSA bacteremia -unclear source currently, concern for cellulitis of the RLE and nephrology has ordered MRI hip that is pending -lactic acid WNL and pt afebrile though WBC up-trending -Echo and TEE without  significant cardiomyopathy or vegetation P: -initiate Levophed peripherally, had central line that was removed today -Continue Vancomycin and broaden with Cefepime/Flagyl, repeat BC drawn 10/25  results are pending and ID is following -concern for RLE cellulitis or osteomyelitis and MRI was ordered,  Will obtain CT RLE overnight to evaluate for osteomyelitis, abscess or necrotizing fascitis -consider LP, however pt has no neck stiffness or fever and suspect RLE may be the source of her sepsis   Encephalopathy  -intermittent since admission, negative head CT -hypercarbia likely contributory P: -MRI head ordered, follow results  -suspect acute hypotension may be contributory, monitor for improvement with pressors -check ammonia -consider LP if clinical status worsens and   Anion Gap Acidosis, likely mixed metabolic and respiratory  -attempted bipap, however pt did not tolerate P: -saturating well without distress, monitor overnight and repeat ABG in several hours     HTN -home medications held   ESRD -Nephrology following, Dialysis M/W/F however missed yesterday 2/2 hypotension P: -Monitor for volume overload and hyperkalemia -Will likely need dialysis today if pressure will tolerate after contrast and IVF   Seropositive, erosive rheumatoid arthritis -Follows with [facility], last note in 03/2018 was on biologic therapy, however nephrology note from two months ago notes pt takes Plaquenil P: -does not appear pt has been on any recent steroids to suggest the need for stress-dose steroids inpatient -will need outpatient f/u  Best practice:  Diet: NPO Pain/Anxiety/Delirium protocol (if indicated): n/a VAP protocol (if indicated): n/a DVT prophylaxis: heparin GI prophylaxis: n/a Glucose control: Lantus and SSI Mobility: bed rest Code Status: Full Family Communication: Will call pt's mother Disposition: ICU  Labs   CBC: Recent Labs  Lab 04/17/19  0320 04/18/19 0224 03/30/2019 0249 03/30/2019 1901  WBC 9.5 12.0* 12.8* 13.0*  NEUTROABS 7.2  --   --   --   HGB 10.5* 9.9* 10.2* 9.9*  HCT 33.1* 31.0* 32.4* 31.3*  MCV 94.6 94.2 94.2 94.8  PLT 232 242 230 [id number]    Basic Metabolic Panel: Recent Labs  Lab 04/17/19 0320 03/27/2019 0249 04/10/2019 1901  NA 134* 133* 131*  K 3.7 4.8 5.0  CL 94* 93* 92*  CO2 27 23 20*  GLUCOSE 92 116* 88  BUN 38* 61* 68*  CREATININE 5.45* 7.82* 8.54*  CALCIUM 8.7* 7.7* 7.6*  MG 2.0  --   --   PHOS 5.4*  --  8.8*   GFR: Estimated Creatinine Clearance: 10.7 mL/min (A) (by C-G formula based on SCr of 8.54 mg/dL (H)). Recent Labs  Lab 04/17/19 0320 04/18/19 0224 04/04/2019 0249 04/18/2019 1901 04/22/2019 2327  WBC 9.5 12.0* 12.8* 13.0*  --   LATICACIDVEN  --   --   --   --  0.7    Liver Function Tests: Recent Labs  Lab 04/17/19 0320 04/09/2019 0249 03/27/2019 1901  AST 16 25  --   ALT 15 14  --   ALKPHOS 145* 254*  --   BILITOT 3.7* 3.7*  --  PROT 6.3* 6.3*  --   ALBUMIN 1.7* 1.7* 1.6*   No results for input(s): LIPASE, AMYLASE in the last 168 hours. No results for input(s): AMMONIA in the last 168 hours.  ABG No results found for: PHART, PCO2ART, PO2ART, HCO3, TCO2, ACIDBASEDEF, O2SAT   Coagulation Profile: No results for input(s): INR, PROTIME in the last 168 hours.  Cardiac Enzymes: No results for input(s): CKTOTAL, CKMB, CKMBINDEX, TROPONINI in the last 168 hours.  HbA1C: Hgb A1c MFr Bld  Date/Time Value Ref Range Status  04/17/2019 03:20 AM 6.9 (H) 4.8 - 5.6 % Final    Comment:    (NOTE) Pre diabetes:          5.7%-6.4% Diabetes:              >6.4% Glycemic control for   <7.0% adults with diabetes   05/26/2018 04:57 AM 7.2 (H) 4.8 - 5.6 % Final    Comment:    (NOTE) Pre diabetes:          5.7%-6.4% Diabetes:              >6.4% Glycemic control for   <7.0% adults with diabetes     CBG: Recent Labs  Lab 03/25/2019 0813 04/10/2019 1239 04/18/2019 1723 04/20/2019 2121  03/29/2019 2313  GLUCAP 106* 95 75 97 111*    Review of Systems:   Unable to obtain due to AMS  Past Medical History  She,  has a past medical history of Anemia, Benign essential HTN, CHF (congestive heart failure) ([place]), CKD (chronic kidney disease) stage 3, GFR 30-59 ml/min, and Diabetes ([place]).   Surgical History    Past Surgical History:  Procedure Laterality Date  . BREAST SURGERY     reduction, 2005  . RENAL BIOPSY  2018     Social History   reports that she has never smoked. She has never used smokeless tobacco. She reports that she does not drink alcohol or use drugs.   Family History   Her family history includes Diabetes in her father and maternal grandmother; Hypertension in her father and mother; Mitral valve prolapse in her mother.   Allergies Allergies  Allergen Reactions  . Contrast Media [Iodinated Diagnostic Agents] Other (See Comments)    Affected her kidneys  . Pepto-Bismol [Bismuth] Other (See Comments)    Unknown reaction     Home Medications  Prior to Admission medications   Medication Sig Start Date End Date Taking? Authorizing Provider  albuterol (PROAIR HFA) 108 (90 Base) MCG/ACT inhaler Inhale 2 puffs into the lungs every 4 (four) hours as needed for wheezing or shortness of breath.    [provider]  allopurinol (ZYLOPRIM) 100 MG tablet TAKE 1 TABLET BY MOUTH TWICE A DAY Patient taking differently: Take 100 mg by mouth 2 (two) times daily.  04/12/19   Newt Minion, MD  aspirin EC 81 MG EC tablet Take 1 tablet (81 mg total) by mouth daily. 06/15/18   Kayleen Memos, DO  calcitRIOL (ROCALTROL) 0.25 MCG capsule Take 1 capsule (0.25 mcg total) by mouth every Monday, Wednesday, and Friday. 08/17/18   Bonnell Public, MD  carvedilol (COREG) 25 MG tablet Take 1 tablet (25 mg total) by mouth 2 (two) times daily with a meal. 06/14/18   Kayleen Memos, DO  cloNIDine (CATAPRES) 0.2 MG tablet Take 0.2 mg by mouth at bedtime. 03/18/19   [provider]  ferric citrate (AURYXIA) 1 GM 210 MG(Fe) tablet Take 210 mg by  mouth 3 (three) times daily with meals.    [provider]  gabapentin (NEURONTIN) 300 MG capsule Take 300 mg by mouth every 8 (eight) hours. 04/10/19   [provider]  hydrALAZINE (APRESOLINE) 50 MG tablet Take 1 tablet (50 mg total) by mouth 3 (three) times daily. 06/23/18   Kroeger, Lorelee Cover., PA-C  Insulin Glargine (LANTUS SOLOSTAR) 100 UNIT/ML Solostar Pen Inject 20 Units into the skin daily.    [provider]  insulin lispro (HUMALOG KWIKPEN) 100 UNIT/ML KwikPen Inject 10 Units into the skin 3 (three) times daily.    [provider]  levofloxacin (LEVAQUIN) 750 MG tablet Take 750 mg by mouth every other day. 02/05/19   [provider]  metolazone (ZAROXOLYN) 2.5 MG tablet Take 2.5 mg by mouth once a week. 03/14/19   [provider]  oxyCODONE-acetaminophen (PERCOCET/ROXICET) 5-325 MG tablet Take 1 tablet by mouth every 4 (four) hours as needed (pain).  04/10/19   [provider]  rosuvastatin (CRESTOR) 40 MG tablet Take 40 mg by mouth at bedtime. 03/23/18   [provider]  torsemide (DEMADEX) 100 MG tablet Take 1 tablet (100 mg total) by mouth 2 (two) times daily. 08/16/18   Bonnell Public, MD     Critical care time: 73     CRITICAL CARE Performed by: Otilio Carpen Titus Drone   Total critical care time: 65 minutes  Critical care time was exclusive of separately billable procedures and treating other patients.  Critical care was necessary to treat or prevent imminent or life-threatening deterioration.  Critical care was time spent personally by me on the following activities: development of treatment plan with patient and/or surrogate as well as nursing, discussions with consultants, evaluation of patient's response to treatment, examination of patient, obtaining history from patient or surrogate, ordering and performing treatments and  interventions, ordering and review of laboratory studies, ordering and review of radiographic studies, pulse oximetry and re-evaluation of patient's condition.  Otilio Carpen Oleg Oleson, PA-C [place] [facility]  Pager# [phone], if no answer [phone]

## 2019-04-20 NOTE — Evaluation (Signed)
Occupational Therapy Evaluation Patient Details Name: Carla Compton MRN: [id number] DOB: [birth year] Today's Date: 04/20/2019    History of Present Illness This 38 y.o. female transferred from [facility] hospital with septic shock due to MRSA bactermia.  PMH includes:  ESRD on HD since 02/2019; DVT Rt LE, HTN, CHF, DM   Clinical Impression   Pt admitted with above. She demonstrates the below listed deficits and will benefit from continued OT to maximize safety and independence with BADLs.  Pt requires total A for all aspects of ADLs.  She is lethargic with confusion noted.  She required max - total A for bed mobility and max A, overall for EOB sitting.  Anticipate she will require SNF level rehab at discharge.       Follow Up Recommendations  SNF    Equipment Recommendations  None recommended by OT    Recommendations for Other Services       Precautions / Restrictions Precautions Precautions: Fall Restrictions Weight Bearing Restrictions: No      Mobility Bed Mobility Overal bed mobility: Needs Assistance Bed Mobility: Supine to Sit;Sit to Supine     Supine to sit: Total assist;+2 for physical assistance;+2 for safety/equipment Sit to supine: Max assist;+2 for physical assistance;+2 for safety/equipment   General bed mobility comments: pt able to assist with lowering trunk and with pulling feet/LEs onto bed   Transfers                 General transfer comment: unable to safely attempt     Balance Overall balance assessment: Needs assistance Sitting-balance support: Single extremity supported Sitting balance-Leahy Scale: Poor Sitting balance - Comments: Pt requirs max A to maintain EOB sitting with a brief period of min guard assist.  Pt with Lt lateral and posterior lean                                    ADL either performed or assessed with clinical judgement   ADL Overall ADL's : Needs assistance/impaired   Eating/Feeding  Details (indicate cue type and reason): not attempted due to lethargy  Grooming: Wash/dry hands;Wash/dry face;Oral care;Brushing hair;Total assistance;Sitting   Upper Body Bathing: Total assistance;Sitting   Lower Body Bathing: Total assistance;Bed level   Upper Body Dressing : Total assistance;Sitting   Lower Body Dressing: Total assistance;Sit to/from stand   Toilet Transfer: Total assistance Toilet Transfer Details (indicate cue type and reason): unable to safely attempt  Toileting- Clothing Manipulation and Hygiene: Total assistance;Bed level       Functional mobility during ADLs: Maximal assistance;Total assistance;+2 for physical assistance;+2 for safety/equipment(bed level only ) General ADL Comments: Pt unable to engage in ADLs due to lethargy      Vision   Additional Comments: Pt unable to engage in visual assessment      Perception     Praxis      Pertinent Vitals/Pain Pain Assessment: Faces Faces Pain Scale: Hurts whole lot Pain Location: back and Rt LE  Pain Descriptors / Indicators: Grimacing;Guarding;Moaning Pain Intervention(s): Monitored during session;Limited activity within patient's tolerance;Repositioned     Hand Dominance Right   Extremity/Trunk Assessment Upper Extremity Assessment Upper Extremity Assessment: Generalized weakness;RUE deficits/detail;LUE deficits/detail RUE Deficits / Details: not formally assessed due to pt lethargy.  She will pull and push with UEs to assist with sitting balance  LUE Deficits / Details: not formally assessed due to pt lethargy.  She will pull and  push with UEs to assist with sitting balance   Lower Extremity Assessment Lower Extremity Assessment: Defer to PT evaluation       Communication Communication Communication: Expressive difficulties;Other (comment)(low volume due likely to lethargy )   Cognition Arousal/Alertness: Lethargic Behavior During Therapy: Flat affect Overall Cognitive Status:  Impaired/Different from baseline Area of Impairment: Orientation;Attention;Memory;Following commands;Safety/judgement;Awareness;Problem solving                 Orientation Level: Disoriented to;Time Current Attention Level: Focused   Following Commands: Follows one step commands inconsistently Safety/Judgement: Decreased awareness of deficits;Decreased awareness of safety     General Comments: Pt lethargic and difficulty to engage in activity or conversation.  Perseveration noted.  She follows command inconsistently    General Comments  in supine BP 95/55, with sitting EoB BP 131/99 back in supine 87/52 Hr in low 80s throughout, and SaO2 on 2L O2 via [place] >92%O2 throughout    Exercises     Shoulder Instructions      Home Living Family/patient expects to be discharged to:: Private residence Living Arrangements: Alone Available Help at Discharge: Family;Available PRN/intermittently(mother who works) Type of Home: Apartment Home Access: Stairs to enter Technical brewer of Steps: 4 Entrance Stairs-Rails: Can reach both Home Layout: One level     Bathroom Shower/Tub: Tub/shower unit         Home Equipment: Cane - single point;Walker - 2 wheels          Prior Functioning/Environment Level of Independence: Needs assistance  Gait / Transfers Assistance Needed: ambulates with cane ADL's / Homemaking Assistance Needed: mother assists with meal prep and other A   Comments: drivers herself to dialysis        OT Problem List: Decreased strength;Decreased activity tolerance;Impaired balance (sitting and/or standing);Decreased cognition;Decreased safety awareness;Decreased knowledge of use of DME or AE;Decreased knowledge of precautions;Obesity;Pain      OT Treatment/Interventions:      OT Goals(Current goals can be found in the care plan section) Acute Rehab OT Goals Patient Stated Goal: to lie down  OT Goal Formulation: Patient unable to participate in goal  setting Time For Goal Achievement: 05/04/19 Potential to Achieve Goals: Good ADL Goals Pt Will Perform Grooming: (P) with set-up;sitting Pt Will Perform Upper Body Bathing: (P) with set-up;with supervision;sitting Pt Will Perform Lower Body Bathing: (P) Independently;with adaptive equipment;sit to/from stand Pt Will Perform Upper Body Dressing: (P) with min assist;sitting Pt Will Perform Lower Body Dressing: (P) with mod assist;sit to/from stand;with adaptive equipment Pt Will Transfer to Toilet: (P) with mod assist;stand pivot transfer;bedside commode Pt Will Perform Toileting - Clothing Manipulation and hygiene: (P) with mod assist;sit to/from stand  OT Frequency: Min 2X/week   Barriers to D/C: Decreased caregiver support          Co-evaluation PT/OT/SLP Co-Evaluation/Treatment: Yes Reason for Co-Treatment: Complexity of the patient's impairments (multi-system involvement);Necessary to address cognition/behavior during functional activity;For patient/therapist safety;To address functional/ADL transfers   OT goals addressed during session: ADL's and self-care;Strengthening/ROM      AM-PAC OT "6 Clicks" Daily Activity     Outcome Measure Help from another person eating meals?: Total Help from another person taking care of personal grooming?: Total Help from another person toileting, which includes using toliet, bedpan, or urinal?: Total Help from another person bathing (including washing, rinsing, drying)?: Total Help from another person to put on and taking off regular upper body clothing?: Total Help from another person to put on and taking off regular lower body clothing?: Total  6 Click Score: 6   End of Session Equipment Utilized During Treatment: Oxygen Nurse Communication: Mobility status  Activity Tolerance: Patient limited by lethargy Patient left: in bed;with call bell/phone within reach  OT Visit Diagnosis: Unsteadiness on feet (R26.81);Muscle weakness (generalized)  (M62.81);Cognitive communication deficit (R41.841);Pain Pain - Right/Left: Right Pain - part of body: Leg                Time: 1110-1136 OT Time Calculation (min): 26 min Charges:  OT General Charges $OT Visit: 1 Visit OT Evaluation $OT Eval Moderate Complexity: 1 Mod  Lucille Passy, OTR/L [place] Pager [phone] Office [phone]   Lucille Passy M 04/20/2019, 3:58 PM

## 2019-04-20 NOTE — Progress Notes (Addendum)
Patient Hypotensive 84/55. Patient alert to self and year, disoriented to location. Patient received 2 Bolus x 140ml NS per PRN order. BP 70/43. Paged Hollace Hayward, NP. See New orders.

## 2019-04-20 NOTE — Procedures (Signed)
Critical care central line insertion note  Indication: Acute CRRT  Consent obtained: yes  Preprocedural timeout called: yes  Full aseptic bundle used: yes  Ultrasound guidance: yes  Line inserted: 20cm Powertrialysis  Insertion depth: 19cm  Vessel used: R femoral  Number of attempts: 1  Line placement confirmation: guidewire in vessel.      X-ray performed: not indicated.  Kipp Brood, MD [facility] ICU Physician [place]  Pager: [phone] Mobile: [phone] After hours: [phone].  08/17/2018, 6:28 PM

## 2019-04-20 NOTE — Plan of Care (Signed)

## 2019-04-20 NOTE — Evaluation (Signed)
Physical Therapy Evaluation Patient Details Name: Carla Compton MRN: [id number] DOB: 06-06-1981 Today's Date: 04/20/2019   History of Present Illness  This 38 y.o. female transferred from [facility] hospital with septic shock due to MRSA bactermia.  PMH includes:  ESRD on HD since 02/2019; DVT Rt LE, HTN, CHF, DM  Clinical Impression  PTA pt reports living alone in apartment with 4 steps to enter. Pt reports driving herself to dialysis, but that her mother assists with meal prep and iADLS. Pt was ambulating limited community distances with a cane. Pt is currently limited in safe mobility by lethargy and back pain, as well as generalized weakness. Pt requires totalA for coming to EoB. Pt with reports of increased pain, but able to sit on EoB for about 5 minutes before requesting to return to bed. Pt requires maxAx2 for return to bed. PT recommending SNF level rehab at discharge. PT will continue to follow acutely.     Follow Up Recommendations SNF    Equipment Recommendations  None recommended by PT       Precautions / Restrictions Precautions Precautions: Fall Restrictions Weight Bearing Restrictions: No      Mobility  Bed Mobility Overal bed mobility: Needs Assistance Bed Mobility: Supine to Sit;Sit to Supine     Supine to sit: Total assist;+2 for physical assistance;+2 for safety/equipment Sit to supine: Max assist;+2 for physical assistance;+2 for safety/equipment   General bed mobility comments: pt able to assist with lowering trunk and with pulling feet/LEs onto bed   Transfers                 General transfer comment: unable to safely attempt          Balance Overall balance assessment: Needs assistance Sitting-balance support: Single extremity supported Sitting balance-Leahy Scale: Poor Sitting balance - Comments: Pt requirs max A to maintain EOB sitting with a brief period of min guard assist.  Pt with Lt lateral and posterior lean                                       Pertinent Vitals/Pain Pain Assessment: Faces Faces Pain Scale: Hurts whole lot Pain Location: back and Rt LE  Pain Descriptors / Indicators: Grimacing;Guarding;Moaning Pain Intervention(s): Monitored during session;Limited activity within patient's tolerance;Repositioned    Home Living Family/patient expects to be discharged to:: Private residence Living Arrangements: Alone Available Help at Discharge: Family;Available PRN/intermittently(mother who works) Type of Home: Apartment Home Access: Stairs to enter Entrance Stairs-Rails: Can reach both Entrance Stairs-Number of Steps: 4 Home Layout: One level Home Equipment: Cane - single point;Walker - 2 wheels      Prior Function Level of Independence: Needs assistance   Gait / Transfers Assistance Needed: ambulates with cane  ADL's / Homemaking Assistance Needed: mother assists with meal prep and other A  Comments: drivers herself to dialysis     Hand Dominance   Dominant Hand: Right    Extremity/Trunk Assessment   Upper Extremity Assessment Upper Extremity Assessment: Defer to OT evaluation RUE Deficits / Details: not formally assessed due to pt lethargy.  She will pull and push with UEs to assist with sitting balance  LUE Deficits / Details: not formally assessed due to pt lethargy.  She will pull and push with UEs to assist with sitting balance    Lower Extremity Assessment Lower Extremity Assessment: RLE deficits/detail;LLE deficits/detail RLE Deficits / Details: generalized weakness,  not formally assessed due to lethargy  LLE Deficits / Details: generalized weakness, not formally assessed due to lethargy        Communication   Communication: Expressive difficulties;Other (comment)(low volume due likely to lethargy )  Cognition Arousal/Alertness: Lethargic Behavior During Therapy: Flat affect Overall Cognitive Status: Impaired/Different from baseline Area of Impairment:  Orientation;Attention;Memory;Following commands;Safety/judgement;Awareness;Problem solving                 Orientation Level: Disoriented to;Time Current Attention Level: Focused   Following Commands: Follows one step commands inconsistently Safety/Judgement: Decreased awareness of deficits;Decreased awareness of safety     General Comments: Pt lethargic and difficulty to engage in activity or conversation.  Perseveration noted.  She follows command inconsistently       General Comments General comments (skin integrity, edema, etc.): in supine BP 95/55, with sitting EoB BP 131/99 back in supine 87/52 Hr in low 80s throughout, and SaO2 on 2L O2 via [place] >92%O2 throughout        Assessment/Plan    PT Assessment Patient needs continued PT services  PT Problem List Decreased strength;Decreased activity tolerance;Decreased mobility;Decreased balance;Decreased safety awareness;Pain       PT Treatment Interventions DME instruction;Gait training;Functional mobility training;Therapeutic activities;Therapeutic exercise;Balance training;Cognitive remediation;Patient/family education    PT Goals (Current goals can be found in the Care Plan section)  Acute Rehab PT Goals Patient Stated Goal: to lie down  PT Goal Formulation: With patient Time For Goal Achievement: 05/04/19 Potential to Achieve Goals: Fair    Frequency Min 2X/week   Barriers to discharge Decreased caregiver support      Co-evaluation PT/OT/SLP Co-Evaluation/Treatment: Yes Reason for Co-Treatment: Complexity of the patient's impairments (multi-system involvement) PT goals addressed during session: Mobility/safety with mobility OT goals addressed during session: ADL's and self-care;Strengthening/ROM       AM-PAC PT "6 Clicks" Mobility  Outcome Measure Help needed turning from your back to your side while in a flat bed without using bedrails?: Total Help needed moving from lying on your back to sitting on the  side of a flat bed without using bedrails?: Total Help needed moving to and from a bed to a chair (including a wheelchair)?: Total Help needed standing up from a chair using your arms (e.g., wheelchair or bedside chair)?: Total Help needed to walk in hospital room?: Total Help needed climbing 3-5 steps with a railing? : Total 6 Click Score: 6    End of Session   Activity Tolerance: Patient limited by pain Patient left: in bed;with call bell/phone within reach;with bed alarm set Nurse Communication: Mobility status PT Visit Diagnosis: Muscle weakness (generalized) (M62.81);Difficulty in walking, not elsewhere classified (R26.2);Pain Pain - part of body: (back)    Time: 1110-1136 PT Time Calculation (min) (ACUTE ONLY): 26 min   Charges:   PT Evaluation $PT Eval Moderate Complexity: 1 Mod          Antonia Jicha B. Migdalia Dk PT, DPT Acute Rehabilitation Services Pager [phone] Office [phone]   [place] 04/20/2019, 4:21 PM

## 2019-04-20 NOTE — Progress Notes (Signed)
Writer called mother of patient, Carla Compton, no answer.

## 2019-04-20 NOTE — Progress Notes (Signed)
[facility]  Date of Admission:  04/09/2019      Total days of antibiotics 9 Vancomycin  Day 1 cefepime + metronidazole           ASSESSMENT: Carla Compton is a 38 y.o. female with known MRSA bacteremia due to unexplained source (TEE negative, CT scan of T-L spine and RLE unrevealing) with ongoing leg pain, hypoxia and now overnight acidosis with vasopressor requirement.   She has worsening acidosis and encephalopathy - nephrology will be starting CVVHD at bedside once line is placed. Fortunately her blood culutres from 10/25 are no growth 48h. WBC continues to rise and remains afebrile.   Source of infection remains unclear. I also do not understand the pathology of her right hip pain. ?formal orthopedic evaluation. No findings on exam correlating with cellulitis read on CT.    Her previous central line site following removal yesterday was described to have purulence. If this becomes tender may need to ultrasound site to ensure no infected thrombus.   OK to continue cefepime for now however I suspect is all MRSA with unidentified source.     PLAN: 1. Please collect respiratory specimen  2. Continue vancomycin (will be starting CVVHD) 3. OK to continue cefepime for now. Will D/C flagyl 4. 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 allopurinol  100 mg Oral BID  . aspirin EC  81 mg Oral Daily  . calcitRIOL  0.5 mcg Oral Q M,W,F-HD  . Chlorhexidine Gluconate Cloth  6 each Topical Daily  . Chlorhexidine Gluconate Cloth  6 each Topical Q0600  . Chlorhexidine Gluconate Cloth  6 each Topical Q0600  . darbepoetin (ARANESP) injection - DIALYSIS  60 mcg Intravenous Q Tue-HD  .  feeding supplement (PRO-STAT SUGAR FREE 64)  30 mL Oral BID  . insulin aspart  0-9 Units Subcutaneous TID WC  . multivitamin  1 tablet Oral QHS  . rosuvastatin  40 mg Oral QHS  . sevelamer carbonate  800 mg Oral TID WC    SUBJECTIVE: Still in a lot of pain all over but localized primarily to the right hip. Will not allow me to raise or examine today. She otherwise is lethargic and often difficult to awaken. Describes breathing to be worse than yesterday.   Interval Events:  Noted from O/N patient requiring transfer to ICU care for hypotension, lethargy. Requiring low dose norepinephrine @ 9 mcg. Broadened to antibiotics to cefepime + flagyl. CT of the right leg unremarkable for infection. CXR indicates possible patchy infiltrates but critical care feels parenchyma looks underwhelming for PNA. She is still requiring oxygen therapy which is new.   Worsening acidosis on bicarb drip.  Last dose vanco with HD on 10/23; has not had treatment since.    Review of Systems: Review of Systems  Constitutional: Positive for malaise/fatigue. Negative for fever.  Respiratory: Positive for shortness of breath. Negative for cough and sputum production.   Cardiovascular: Negative for leg swelling.  Gastrointestinal: Positive for diarrhea. Negative for abdominal pain and vomiting.  Musculoskeletal: Positive for joint pain.  Neurological: Negative for focal weakness and headaches.    Allergies  Allergen Reactions  . Contrast  Media [Iodinated Diagnostic Agents] Other (See Comments)    Affected her kidneys  . Pepto-Bismol [Bismuth] Other (See Comments)    Unknown reaction    OBJECTIVE: Vitals:   04/20/19 1130 04/20/19 1145 04/20/19 1200 04/20/19 1215  BP:  (!) 93/53 (!) 91/59 (!) 92/56  Pulse: 78 78 78 78  Resp: 19 14 12 11   Temp:      TempSrc:      SpO2: 97% 100% 100% 100%  Weight:      Height:       Body mass index is 46.77 kg/m.  Physical Exam Vitals signs and nursing note reviewed.   Constitutional:      Comments: Resting in bed, lethargic. No distress but when awake moans in pain and very uncomfortable.   HENT:     Mouth/Throat:     Mouth: Mucous membranes are dry.     Pharynx: Oropharynx is clear.  Eyes:     General: No scleral icterus.    Pupils: Pupils are equal, round, and reactive to light.  Neck:     Musculoskeletal: Neck supple.     Comments: Dressing in place from CVC removal - remarked yesterday that there was scant pus present.  Cardiovascular:     Rate and Rhythm: Normal rate and regular rhythm.     Heart sounds: No murmur.  Pulmonary:     Effort: Pulmonary effort is normal.     Breath sounds: Wheezing and rhonchi (anterior upper fields ) present.     Comments: 2 lpm via [place] Abdominal:     General: There is no distension.     Tenderness: There is no abdominal tenderness.  Musculoskeletal:     Comments: Will not allow me to examine ROM of right hip further   Skin:    General: Skin is warm and dry.     Capillary Refill: Capillary refill takes less than 2 seconds.     Findings: No erythema or rash.  Neurological:     Mental Status: She is oriented to person, place, and time.     Lab Results Lab Results  Component Value Date   WBC 15.3 (H) 04/20/2019   HGB 10.1 (L) 04/20/2019   HCT 33.2 (L) 04/20/2019   MCV 97.9 04/20/2019   PLT 257 04/20/2019    Lab Results  Component Value Date   CREATININE 8.83 (H) 04/20/2019   BUN 74 (H) 04/20/2019   NA 131 (L) 04/20/2019   K 5.3 (H) 04/20/2019   CL 96 (L) 04/20/2019   CO2 18 (L) 04/20/2019    Lab Results  Component Value Date   ALT 16 04/20/2019   AST 46 (H) 04/20/2019   ALKPHOS 393 (H) 04/20/2019   BILITOT 3.7 (H) 04/20/2019     Microbiology: Recent Results (from the past 240 hour(s))  SARS CORONAVIRUS 2 (TAT 6-24 HRS) Nasopharyngeal Nasopharyngeal Swab     Status: None   Collection Time: 04/17/19 12:27 AM   Specimen: Nasopharyngeal Swab  Result Value Ref Range Status   SARS  Coronavirus 2 NEGATIVE NEGATIVE Final    Comment: (NOTE) SARS-CoV-2 target nucleic acids are NOT DETECTED. The SARS-CoV-2 RNA is generally detectable in upper and lower respiratory specimens during the acute phase of infection. Negative results do not preclude SARS-CoV-2 infection, do not rule out co-infections with other pathogens, and should not be used as the sole basis for treatment or other patient management decisions. Negative results must be combined with clinical observations, patient history, and epidemiological information. The expected  result is Negative. Fact Sheet for Patients: SugarRoll.be Fact Sheet for Healthcare Providers: https://www.woods-mathews.com/ This test is not yet approved or cleared by the [place] FDA and  has been authorized for detection and/or diagnosis of SARS-CoV-2 by FDA under an Emergency Use Authorization (EUA). This EUA will remain  in effect (meaning this test can be used) for the duration of the COVID-19 declaration under Section 56 4(b)(1) of the Act, 21 U.S.C. section 360bbb-3(b)(1), unless the authorization is terminated or revoked sooner. Performed at [facility], [place] [address], [place], [place] [address]   Culture, blood (routine x 2)     Status: None (Preliminary result)   Collection Time: 04/18/19  8:29 AM   Specimen: BLOOD RIGHT HAND  Result Value Ref Range Status   Specimen Description BLOOD RIGHT HAND  Final   Special Requests   Final    AEROBIC BOTTLE ONLY Blood Culture results may not be optimal due to an inadequate volume of blood received in culture bottles   Culture   Final    NO GROWTH 2 DAYS Performed at [facility], [place] [address], [place], [place] [address]    Report Status PENDING  Incomplete  Culture, blood (routine x 2)     Status: None (Preliminary result)   Collection Time: 04/18/19  5:41 PM   Specimen: BLOOD RIGHT HAND  Result Value Ref Range Status    Specimen Description BLOOD RIGHT HAND  Final   Special Requests   Final    AEROBIC BOTTLE ONLY Blood Culture results may not be optimal due to an inadequate volume of blood received in culture bottles   Culture   Final    NO GROWTH 2 DAYS Performed at [facility], [place] [address], [place], [place] [address]    Report Status PENDING  Incomplete  MRSA PCR Screening     Status: Abnormal   Collection Time: 04/18/2019  9:26 AM   Specimen: Nasopharyngeal  Result Value Ref Range Status   MRSA by PCR POSITIVE (A) NEGATIVE Final    Comment:        The GeneXpert MRSA Assay (FDA approved for NASAL specimens only), is one component of a comprehensive MRSA colonization surveillance program. It is not intended to diagnose MRSA infection nor to guide or monitor treatment for MRSA infections. RESULT CALLED TO, READ BACK BY AND VERIFIED WITH: Jefferson Fuel RN 12:05 04/10/2019 ([email or web address]) Performed at [facility], [place] [address] [place], [place] [address]     Janene Madeira, MSN, NP-C [facility] for Infectious Disease [facility] Cell: [phone] Pager: [phone]  @TODAY @ 12:28 PM

## 2019-04-20 NOTE — Progress Notes (Signed)
Patient received  250 NS bolus. No improvement in BP. Carla Hayward NP paged. BP 76/40, map of 52. Patient to receive 500 NS bolus. Rapid Response called and at bedside at 1130.  BP 76/40 at 0017. Critical care team at bedside at [place].

## 2019-04-20 NOTE — Procedures (Signed)
Patient Name: Carla Compton  MRN: [id number]  Epilepsy Attending: Lora Havens  Referring Physician/Provider: Dr Gala Romney Date: 04/18/2009 Duration: 22.68 mins  Patient history: 39 year old F with altered mental status.  EEG to evaluate for seizures. Level of alertness: awake  AEDs during EEG study: None  Technical aspects: This EEG study was done with scalp electrodes positioned according to the 10-20 International system of electrode placement. Electrical activity was acquired at a sampling rate of 500Hz  and reviewed with a high frequency filter of 70Hz  and a low frequency filter of 1Hz . EEG data were recorded continuously and digitally stored.   Description: EEG showed continuous generalized polymorphic 2 to 5 Hz theta-delta slowing as well as intermittent rhythmic 2 to 3 Hz generalized delta slowing.  No clear posterior dominant rhythm was seen.  Hyperventilation and photic stimulation were not performed.  Abnormality -Continuous slow, generalized -Intermittent rhythmic slow, generalized  IMPRESSION: This study is suggestive of moderate diffuse encephalopathy, nonspecific etiology. No seizures or epileptiform discharges were seen throughout the recording.

## 2019-04-20 NOTE — Progress Notes (Signed)
Report called and given to [facility], patient to transfer to [phone].

## 2019-04-20 NOTE — Progress Notes (Signed)
ANTICOAGULATION CONSULT NOTE - Follow Up Consult  Pharmacy Consult for Heparin Indication: DVT  Allergies  Allergen Reactions  . Contrast Media [Iodinated Diagnostic Agents] Other (See Comments)    Affected her kidneys  . Pepto-Bismol [Bismuth] Other (See Comments)    Unknown reaction    Patient Measurements: Height: 5\' 2"  (157.5 cm) Weight: 255 lb 11.7 oz (116 kg) IBW/kg (Calculated) : 50.1 Heparin Dosing Weight: 77.3 kg  Vital Signs: Temp: 97.6 F (36.4 C) (10/27 0805) Temp Source: Oral (10/27 0805) BP: 85/51 (10/27 0700) Pulse Rate: 80 (10/27 0700)  Labs: Recent Labs    04/18/19 0809  03/31/2019 0249 04/23/2019 1901 04/21/2019 2356 04/20/19 0259 04/20/19 0639  HGB  --   --  10.2* 9.9*  --  11.2* 10.1*  HCT  --   --  32.4* 31.3*  --  33.0* 33.2*  PLT  --   --  230 221  --   --  257  APTT 40*   < > 79*  --  72*  --  146*  HEPARINUNFRC 0.97*  --  0.90*  --   --   --  0.93*  CREATININE  --   --  7.82* 8.54*  --   --  8.83*   < > = values in this interval not displayed.    Estimated Creatinine Clearance: 10.6 mL/min (A) (by C-G formula based on SCr of 8.83 mg/dL (H)).   Assessment:  Anticoag: Heparin drip for RLE DVT, was on Xarelto at OSH,  LD 10/23. Resume on discharge - aPTT 146 supratherapeutic, HL remains elevated due to Xarelto interaction. Hgb 10.1 stable. Plts 257 stable.  Goal of Therapy:  aPTT 66-102 seconds Monitor platelets by anticoagulation protocol: Yes   Plan:  Decrease IV heparin to 1700 units/hr Daily HL, aPTT, CBC F/U med hx and resuming meds pending mental status   Alanda Slim, PharmD, [facility] Clinical Pharmacist Please see AMION for all Pharmacists' Contact Phone Numbers 04/20/2019, 8:37 AM

## 2019-04-20 NOTE — Progress Notes (Addendum)
Patient was transferred to ICU for hypotension overnight and was started on IV pressors. She will need CCRT. Spoke to Dr Abigail Butts, who will take over the care of the patient.  TRH will sign off   Shaterrica Territo. MD [phone]

## 2019-04-20 NOTE — Progress Notes (Signed)
CRITICAL VALUE ALERT  Critical Value:  K=6.7  Date & Time Notied:  04/20/19 1705  Provider Notified: Agarwala  Orders Received/Actions taken: No new orders. Continue CRRT.

## 2019-04-20 NOTE — Anesthesia Postprocedure Evaluation (Signed)
Anesthesia Post Note  Patient: Carla Compton  Procedure(s) Performed: TRANSESOPHAGEAL ECHOCARDIOGRAM (TEE) (N/A )     Patient location during evaluation: Endoscopy Anesthesia Type: MAC Level of consciousness: awake and alert Pain management: pain level controlled Vital Signs Assessment: post-procedure vital signs reviewed and stable Respiratory status: spontaneous breathing, nonlabored ventilation, respiratory function stable and patient connected to nasal cannula oxygen Cardiovascular status: blood pressure returned to baseline and stable Postop Assessment: no apparent nausea or vomiting Anesthetic complications: no    Last Vitals:  Vitals:   04/20/19 0630 04/20/19 0645  BP: (!) 87/56 (!) 89/55  Pulse: 80 80  Resp: 11 16  Temp:    SpO2: 100% 100%    Last Pain:  Vitals:   04/20/19 0400  TempSrc: Axillary  PainSc:                  Sabastien Tyler S

## 2019-04-20 NOTE — Progress Notes (Addendum)
[facility] ROUNDING NOTE   Subjective:   This is a 38 year old female with a history of end-stage renal disease history of DVT diabetes hypertension rheumatoid arthritis and MRSA bacteremia of unclear etiology.  She developed transient hypotension requiring low-dose pressors and was transferred to the intensive care unit.  Blood pressure 89/57 pulse 87 temperature 97.6 O2 sats 100% 2 L nasal cannula.   Sodium 131 potassium 5.3 chloride 96 CO2 18 BUN 74 creatinine 8.83 glucose 147 calcium 7.2 albumin 393 AST 46 ALT 16 ammonia is 23.  WBC 13 hemoglobin 9.9 platelets 221  Allopurinol 100 mg twice daily aspirin 81 mg daily Calcitrol 0.5 mcg Monday Wednesday Friday Coreg 12.5 mg twice daily, darbepoetin 60 mcg q. Tuesday, multivitamins 1 daily Crestor 40 mg daily Renvela 800 mg with meals  Objective:  Vital signs in last 24 hours:  Temp:  [97.6 F (36.4 C)-98.7 F (37.1 C)] 97.6 F (36.4 C) (10/27 0805) Pulse Rate:  [78-91] 81 (10/27 0900) Resp:  [6-25] 14 (10/27 0900) BP: (70-102)/(38-75) 81/52 (10/27 0900) SpO2:  [87 %-100 %] 99 % (10/27 0900) Weight:  [id number] kg] 116 kg (10/27 0107)  Weight change: 6 kg Filed Weights   04/18/19 0517 04/20/2019 0638 04/20/19 0107  Weight: 112 kg 110 kg 116 kg    Intake/Output: I/O last 3 completed shifts: In: 2234.7 [P.O.:290; I.V.:1244.6; IV Piggyback:700.1] Out: 0    Intake/Output this shift:  Total I/O In: 192.3 [I.V.:192.3] Out: 0   General:  AAOx3 NAD HEENT: MMM [place] AT anicteric sclera Neck:  No JVD, no adenopathy CV:  Heart RRR  Lungs:  L/S CTA bilaterally Abd:  abd SNT/ND with normal BS GU:  Bladder non-palpable Extremities:  No LE edema. Skin:  No skin rash   Basic Metabolic Panel: Recent Labs  Lab 04/17/19 0320 03/27/2019 0249 04/04/2019 1901 04/20/19 0259 04/20/19 0639  NA 134* 133* 131* 130* 131*  K 3.7 4.8 5.0 5.0 5.3*  CL 94* 93* 92*  --  96*  CO2 27 23 20*  --  18*  GLUCOSE 92 116* 88  --  147*  BUN  38* 61* 68*  --  74*  CREATININE 5.45* 7.82* 8.54*  --  8.83*  CALCIUM 8.7* 7.7* 7.6*  --  7.2*  MG 2.0  --   --   --   --   PHOS 5.4*  --  8.8*  --   --     Liver Function Tests: Recent Labs  Lab 04/17/19 0320 04/18/2019 0249 04/15/2019 1901 04/20/19 0639  AST 16 25  --  46*  ALT 15 14  --  16  ALKPHOS 145* 254*  --  393*  BILITOT 3.7* 3.7*  --  3.7*  PROT 6.3* 6.3*  --  6.0*  ALBUMIN 1.7* 1.7* 1.6* 1.7*   No results for input(s): LIPASE, AMYLASE in the last 168 hours. Recent Labs  Lab 04/20/19 0255  AMMONIA 23    CBC: Recent Labs  Lab 04/17/19 0320 04/18/19 0224 04/01/2019 0249 04/18/2019 1901 04/20/19 0259 04/20/19 0639  WBC 9.5 12.0* 12.8* 13.0*  --  15.3*  NEUTROABS 7.2  --   --   --   --   --   HGB 10.5* 9.9* 10.2* 9.9* 11.2* 10.1*  HCT 33.1* 31.0* 32.4* 31.3* 33.0* 33.2*  MCV 94.6 94.2 94.2 94.8  --  97.9  PLT 232 242 230 221  --  257    Cardiac Enzymes: No results for input(s): CKTOTAL, CKMB, CKMBINDEX, TROPONINI  in the last 168 hours.  BNP: Invalid input(s): POCBNP  CBG: Recent Labs  Lab 04/01/2019 1723 04/08/2019 2121 04/10/2019 2313 04/20/19 0109 04/20/19 0803  GLUCAP 75 97 111* 123* 131*    Microbiology: Results for orders placed or performed during the hospital encounter of 04/23/2019  SARS CORONAVIRUS 2 (TAT 6-24 HRS) Nasopharyngeal Nasopharyngeal Swab     Status: None   Collection Time: 04/17/19 12:27 AM   Specimen: Nasopharyngeal Swab  Result Value Ref Range Status   SARS Coronavirus 2 NEGATIVE NEGATIVE Final    Comment: (NOTE) SARS-CoV-2 target nucleic acids are NOT DETECTED. The SARS-CoV-2 RNA is generally detectable in upper and lower respiratory specimens during the acute phase of infection. Negative results do not preclude SARS-CoV-2 infection, do not rule out co-infections with other pathogens, and should not be used as the sole basis for treatment or other patient management decisions. Negative results must be combined with clinical  observations, patient history, and epidemiological information. The expected result is Negative. Fact Sheet for Patients: SugarRoll.be Fact Sheet for Healthcare Providers: https://www.woods-mathews.com/ This test is not yet approved or cleared by the [place] FDA and  has been authorized for detection and/or diagnosis of SARS-CoV-2 by FDA under an Emergency Use Authorization (EUA). This EUA will remain  in effect (meaning this test can be used) for the duration of the COVID-19 declaration under Section 56 4(b)(1) of the Act, 21 U.S.C. section 360bbb-3(b)(1), unless the authorization is terminated or revoked sooner. Performed at [facility], [place] [address], [place], [place] [address]   Culture, blood (routine x 2)     Status: None (Preliminary result)   Collection Time: 04/18/19  8:29 AM   Specimen: BLOOD RIGHT HAND  Result Value Ref Range Status   Specimen Description BLOOD RIGHT HAND  Final   Special Requests   Final    AEROBIC BOTTLE ONLY Blood Culture results may not be optimal due to an inadequate volume of blood received in culture bottles   Culture   Final    NO GROWTH 2 DAYS Performed at [facility], [place] [address], [place], [place] [address]    Report Status PENDING  Incomplete  Culture, blood (routine x 2)     Status: None (Preliminary result)   Collection Time: 04/18/19  5:41 PM   Specimen: BLOOD RIGHT HAND  Result Value Ref Range Status   Specimen Description BLOOD RIGHT HAND  Final   Special Requests   Final    AEROBIC BOTTLE ONLY Blood Culture results may not be optimal due to an inadequate volume of blood received in culture bottles   Culture   Final    NO GROWTH 2 DAYS Performed at [facility], [place] [address], [place], [place] [address]    Report Status PENDING  Incomplete  MRSA PCR Screening     Status: Abnormal   Collection Time: 03/31/2019  9:26 AM   Specimen: Nasopharyngeal  Result Value  Ref Range Status   MRSA by PCR POSITIVE (A) NEGATIVE Final    Comment:        The GeneXpert MRSA Assay (FDA approved for NASAL specimens only), is one component of a comprehensive MRSA colonization surveillance program. It is not intended to diagnose MRSA infection nor to guide or monitor treatment for MRSA infections. RESULT CALLED TO, READ BACK BY AND VERIFIED WITH: Jefferson Fuel RN 12:05 04/19/19 ([email or web address]) Performed at [facility], [place] [address] [place], [place] [address]  Coagulation Studies: No results for input(s): LABPROT, INR in the last 72 hours.  Urinalysis: No results for input(s): COLORURINE, LABSPEC, PHURINE, GLUCOSEU, HGBUR, BILIRUBINUR, KETONESUR, PROTEINUR, UROBILINOGEN, NITRITE, LEUKOCYTESUR in the last 72 hours.  Invalid input(s): APPERANCEUR    Imaging: Dg Chest Port 1 View  Result Date: 04/20/2019 CLINICAL DATA:  Hypoxia EXAM: PORTABLE CHEST 1 VIEW COMPARISON:  04/17/2019 FINDINGS: Cardiac shadow is enlarged but stable. Right jugular catheter has been removed in the interval. The inspiratory effort is poor with patchy parenchymal opacities within both lungs but worst in the right upper lobe consistent with multifocal pneumonia. No bony abnormality is noted. IMPRESSION: Patchy multifocal opacities consistent with pneumonia. Electronically Signed   By: Inez Catalina M.D.   On: 04/20/2019 01:10   Ct Extremity Lower Right W Contrast  Result Date: 04/20/2019 CLINICAL DATA:  Shock with right hip pain. EXAM: CT OF THE LOWER RIGHT EXTREMITY WITH CONTRAST TECHNIQUE: Multidetector CT imaging of the lower right extremity was performed according to the standard protocol following intravenous contrast administration. COMPARISON:  Right foot MRI 06/08/2018. CT of the right thigh to April 13, 2015 CONTRAST:  8mL OMNIPAQUE IOHEXOL 300 MG/ML  SOLN FINDINGS: Bones/Joint/Cartilage No erosion or fracture seen at the hip. Well-circumscribed subchondral cystic change at  the knee with joint space narrowing, chronic appearing and attributed to osteoarthritis. Multiple generalized central erosions at the Lisfranc joint and at the great toe interphalangeal joint, also seen on comparison MRI when midfoot erosions were attributed to infection. This could also be neurogenic arthropathy, although no progression or dislocation. HD related amyloid arthropathy is also considered given relative stability. No visible hip joint effusion.  Small knee joint effusion. Ligaments Suboptimally assessed by CT. Muscles and Tendons No visible pyomyositis or clear inter fascial edema. 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 Soft tissues Subcutaneous edema at the plantar foot, calf, MRI lateral thigh. No soft tissue gas. Low-density rounded collection like appearance measuring 2 cm below the fifth MTP joint with limited peripheral enhancement, stable from 2019 and likely pressure related. No adjacent ulceration is seen. Atherosclerotic calcification, premature. IMPRESSION: 1. Areas of subcutaneous edema in the lateral thigh, calf, and foot which could reflect mild cellulitis. 2. Lisfranc and great toe interphalangeal central erosions with plantar collection beneath the fifth MTP joint, chronic when compared to 2019 foot MRI. 3. 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 No pyomyositis or other deep space infection identified. 4. 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 Electronically Signed   By: Monte Fantasia M.D.   On: 04/20/2019 04:18     Medications:   . sodium chloride    . [START ON 04/21/2019] ceFEPime (MAXIPIME) IV    . heparin 1,700 Units/hr (04/20/19 0900)  . metronidazole Stopped (04/20/19 0502)  . norepinephrine (LEVOPHED) Adult infusion 8 mcg/min (04/20/19 0900)  .  sodium bicarbonate (isotonic) infusion in sterile water 50 mL/hr at 04/20/19 0900  . vancomycin     . allopurinol  100 mg  Oral BID  . aspirin EC  81 mg Oral Daily  . calcitRIOL  0.5 mcg Oral Q M,W,F-HD  . carvedilol  12.5 mg Oral BID WC  . Chlorhexidine Gluconate Cloth  6 each Topical Daily  . Chlorhexidine Gluconate Cloth  6 each Topical Q0600  . darbepoetin (ARANESP) injection - DIALYSIS  60 mcg Intravenous Q Tue-HD  . feeding supplement (PRO-STAT SUGAR FREE 64)  30 mL Oral BID  .  insulin aspart  0-9 Units Subcutaneous TID WC  . multivitamin  1 tablet Oral QHS  . rosuvastatin  40 mg Oral QHS  . sevelamer carbonate  800 mg Oral TID WC   acetaminophen **OR** acetaminophen, alteplase, fentaNYL (SUBLIMAZE) injection, heparin, heparin, lidocaine (PF), lidocaine-prilocaine, ondansetron **OR** ondansetron (ZOFRAN) IV, pentafluoroprop-tetrafluoroeth, traMADol  Assessment/ Plan:  1. MRSA bacteremia- persistent without obvious source -repeat BC pending - ID consulted and TEE negative for vegetations. She did have a prior MRSA hand wound 07/2018.  MRI of hip planned looking for untreated source of infection 2. ESRD- MWF -[place] -no ultrafiltration of fluid 04/05/2019 recorded.  Next dialysis treatment be 04/21/2019 3. Hypertension/volume-we will discontinue carvedilol due to hypotension 4. Anemia- hgb 10.5>9.9 Start Aranesp 60 Tuesday  5. Metabolic bone disease- Continue calcitriol with HD.    Renvela added as binder  6. Nutrition - alb low - renal carb mod diet - add protein suppl 7. Transient confusion and agitation during prior acute admission prior to transfer to [facility] 8. DM/gout/GERD -meds per primary 9. Rheumatoid arthritis -stable not issue at this time 10.Recently diagnosed RLE DVT 04/11/19 - heparin IV for now 11.Elevated T. Bili - present at transfer  Patient transferred to the intensive care unit for hypotension and shock requiring IV pressors will therefore initiate CRRT.   LOS: [place] @TODAY @9 :15 AM

## 2019-04-20 NOTE — Progress Notes (Signed)
Pharmacy Antibiotic Note  Carla Compton is a 38 y.o. female with AMS/hypotension , possible sepsis.  Pharmacy has been consulted for Cefepine dosing.  Plan: Cefepime 2 g IV now and after each HD  Height: 5\' 2"  (157.5 cm) Weight: 242 lb 8.1 oz (110 kg) IBW/kg (Calculated) : 50.1  Temp (24hrs), Avg:98.3 F (36.8 C), Min:97.6 F (36.4 C), Max:98.7 F (37.1 C)  Recent Labs  Lab 04/17/19 0320 04/18/19 0224 04/05/2019 0249 04/13/2019 1901 04/05/2019 2327  WBC 9.5 12.0* 12.8* 13.0*  --   CREATININE 5.45*  --  7.82* 8.54*  --   LATICACIDVEN  --   --   --   --  0.7  VANCORANDOM 10  --   --   --   --     Estimated Creatinine Clearance: 10.7 mL/min (A) (by C-G formula based on SCr of 8.54 mg/dL (H)).    Allergies  Allergen Reactions  . Contrast Media [Iodinated Diagnostic Agents] Other (See Comments)    Affected her kidneys  . Pepto-Bismol [Bismuth] Other (See Comments)    Unknown reaction    Carla Compton 04/20/2019 1:04 AM

## 2019-04-21 DIAGNOSIS — R52 Pain, unspecified: Secondary | ICD-10-CM | POA: Diagnosis not present

## 2019-04-21 DIAGNOSIS — E8770 Fluid overload, unspecified: Secondary | ICD-10-CM | POA: Diagnosis not present

## 2019-04-21 DIAGNOSIS — R7881 Bacteremia: Secondary | ICD-10-CM | POA: Diagnosis not present

## 2019-04-21 DIAGNOSIS — A419 Sepsis, unspecified organism: Secondary | ICD-10-CM | POA: Diagnosis not present

## 2019-04-21 DIAGNOSIS — B9562 Methicillin resistant Staphylococcus aureus infection as the cause of diseases classified elsewhere: Secondary | ICD-10-CM | POA: Diagnosis not present

## 2019-04-21 LAB — RENAL FUNCTION PANEL
Albumin: 1.6 g/dL — ABNORMAL LOW (ref 3.5–5.0)
Albumin: 1.7 g/dL — ABNORMAL LOW (ref 3.5–5.0)
Anion gap: 13 (ref 5–15)
Anion gap: 13 (ref 5–15)
BUN: 38 mg/dL — ABNORMAL HIGH (ref 6–20)
BUN: 23 mg/dL — ABNORMAL HIGH (ref 6–20)
CO2: 21 mmol/L — ABNORMAL LOW (ref 22–32)
CO2: 23 mmol/L (ref 22–32)
Calcium: 7.2 mg/dL — ABNORMAL LOW (ref 8.9–10.3)
Calcium: 7.6 mg/dL — ABNORMAL LOW (ref 8.9–10.3)
Chloride: 101 mmol/L (ref 98–111)
Chloride: 99 mmol/L (ref 98–111)
Creatinine, Ser: 4.78 mg/dL — ABNORMAL HIGH (ref 0.44–1.00)
Creatinine, Ser: 3.25 mg/dL — ABNORMAL HIGH (ref 0.44–1.00)
GFR calc Af Amer: 13 mL/min — ABNORMAL LOW (ref >60)
GFR calc Af Amer: 20 mL/min — ABNORMAL LOW (ref >60)
GFR calc non Af Amer: 11 mL/min — ABNORMAL LOW (ref >60)
GFR calc non Af Amer: 17 mL/min — ABNORMAL LOW (ref >60)
Glucose, Bld: 100 mg/dL — ABNORMAL HIGH (ref 70–99)
Glucose, Bld: 86 mg/dL (ref 70–99)
Phosphorus: 4.7 mg/dL — ABNORMAL HIGH (ref 2.5–4.6)
Phosphorus: 3.4 mg/dL (ref 2.5–4.6)
Potassium: 4.7 mmol/L (ref 3.5–5.1)
Potassium: 4.4 mmol/L (ref 3.5–5.1)
Sodium: 135 mmol/L (ref 135–145)
Sodium: 135 mmol/L (ref 135–145)

## 2019-04-21 LAB — HEPARIN LEVEL (UNFRACTIONATED): Heparin Unfractionated: 1.08 IU/mL — ABNORMAL HIGH (ref 0.30–0.70)

## 2019-04-21 LAB — CBC
HCT: 30.7 % — ABNORMAL LOW (ref 36.0–46.0)
Hemoglobin: 9.7 g/dL — ABNORMAL LOW (ref 12.0–15.0)
MCH: 29.9 pg (ref 26.0–34.0)
MCHC: 31.6 g/dL (ref 30.0–36.0)
MCV: 94.8 fL (ref 80.0–100.0)
Platelets: 190 10*3/uL (ref 150–400)
RBC: 3.24 MIL/uL — ABNORMAL LOW (ref 3.87–5.11)
RDW: 16.2 % — ABNORMAL HIGH (ref 11.5–15.5)
WBC: 9.7 10*3/uL (ref 4.0–10.5)
nRBC: 0 % (ref 0.0–0.2)

## 2019-04-21 LAB — MAGNESIUM: Magnesium: 2.3 mg/dL (ref 1.7–2.4)

## 2019-04-21 LAB — GLUCOSE, CAPILLARY
Glucose-Capillary: 112 mg/dL — ABNORMAL HIGH (ref 70–99)
Glucose-Capillary: 76 mg/dL (ref 70–99)
Glucose-Capillary: 77 mg/dL (ref 70–99)
Glucose-Capillary: 86 mg/dL (ref 70–99)
Glucose-Capillary: 90 mg/dL (ref 70–99)

## 2019-04-21 LAB — APTT
aPTT: >200 seconds (ref 24–36)
aPTT: >200 seconds (ref 24–36)

## 2019-04-21 MED ORDER — HEPARIN (PORCINE) 25000 UT/250ML-% IV SOLN
1200.0000 [IU]/h | INTRAVENOUS | Status: AC
  Administered 2019-04-22: 15:00:00 1200 [IU]/h via INTRAVENOUS
  Filled 2019-04-21: qty 250

## 2019-04-21 MED ORDER — MUPIROCIN 2 % EX OINT
TOPICAL_OINTMENT | Freq: Two times a day (BID) | CUTANEOUS | Status: DC
  Administered 2019-04-21 – 2019-04-22 (×3): via NASAL
  Administered 2019-04-22: 1 via NASAL
  Administered 2019-04-23 – 2019-04-24 (×3): via NASAL
  Administered 2019-04-24: 1 via NASAL
  Administered 2019-04-25: 11:00:00 via NASAL
  Administered 2019-04-26: 1 via NASAL
  Administered 2019-04-26 – 2019-04-27 (×2): via NASAL
  Administered 2019-04-27 – 2019-04-28 (×2): 1 via NASAL
  Administered 2019-04-28: 09:00:00 via NASAL
  Administered 2019-04-29: 1 via NASAL
  Administered 2019-04-29 – 2019-04-30 (×3): via NASAL
  Filled 2019-04-21 (×4): qty 22

## 2019-04-21 MED ORDER — FENTANYL CITRATE (PF) 100 MCG/2ML IJ SOLN
25.0000 ug | INTRAMUSCULAR | Status: DC | PRN
  Administered 2019-04-21 – 2019-04-25 (×5): 25 ug via INTRAVENOUS
  Administered 2019-04-25: 10:00:00 via INTRAVENOUS
  Filled 2019-04-21 (×6): qty 2

## 2019-04-21 MED ORDER — NEPRO/CARBSTEADY PO LIQD
237.0000 mL | Freq: Three times a day (TID) | ORAL | Status: DC
  Administered 2019-04-22 (×3): 237 mL via ORAL

## 2019-04-21 MED ORDER — HALOPERIDOL LACTATE 5 MG/ML IJ SOLN
2.0000 mg | Freq: Four times a day (QID) | INTRAMUSCULAR | Status: DC | PRN
Start: 2019-04-21 — End: 2019-04-22
  Administered 2019-04-21 – 2019-04-22 (×2): 2 mg via INTRAVENOUS
  Filled 2019-04-21 (×3): qty 1

## 2019-04-21 MED ORDER — PRO-STAT SUGAR FREE PO LIQD
30.0000 mL | Freq: Three times a day (TID) | ORAL | Status: DC
  Administered 2019-04-22 – 2019-04-23 (×3): 30 mL via ORAL
  Filled 2019-04-21 (×4): qty 30

## 2019-04-21 NOTE — Progress Notes (Signed)
Pharmacy Antibiotic Note  Carla Compton is a 38 y.o. female admitted on 04/15/2019 with hx of MRSA bacteremia, now requiring CRRT. ID team searching for source of infection, TEE/imaging of spine negative.    Plan: -Vancomycin 1 g IV q24h -Monitor CRRT -Vancomycin levels as needed   Height: 5\' 2"  (157.5 cm) Weight: 263 lb 0.1 oz (119.3 kg) IBW/kg (Calculated) : 50.1  Temp (24hrs), Avg:94.6 F (34.8 C), Min:94.5 F (34.7 C), Max:94.9 F (34.9 C)  Recent Labs  Lab 04/17/19 0320 04/18/19 0224 03/29/2019 0249 04/17/2019 1901 04/22/2019 2327 04/20/19 0255 04/20/19 0639 04/20/19 1532 04/21/19 0528  WBC 9.5 12.0* 12.8* 13.0*  --   --  15.3*  --  9.7  CREATININE 5.45*  --  7.82* 8.54*  --   --  8.83* 8.71* 4.78*  LATICACIDVEN  --   --   --   --  0.7 0.9  --   --   --   VANCORANDOM 10  --   --   --   --   --   --   --   --     Estimated Creatinine Clearance: 20 mL/min (A) (by C-G formula based on SCr of 4.78 mg/dL (H)).    Allergies  Allergen Reactions  . Contrast Media [Iodinated Diagnostic Agents] Other (See Comments)    Affected her kidneys  . Pepto-Bismol [Bismuth] Other (See Comments)    Unknown reaction    Antimicrobials this admission: 10/27 flagyl > 10/28 10/27 cefepime x1 10/27 vancomycin >   Microbiology results: 10/24 Covid-19: neg 10/26 mrsa pcr: pos 10/25 blood cx: neg  MastersJake Church 04/21/2019 11:28 AM

## 2019-04-21 NOTE — Progress Notes (Signed)
ANTICOAGULATION CONSULT NOTE - Follow Up Consult  Pharmacy Consult for heparin Indication: DVT  Labs: Recent Labs    03/25/2019 0249 03/25/2019 1901  04/20/19 0259 04/20/19 0639 04/20/19 1532 04/21/19 0528 04/21/19 1604 04/21/19 2309  HGB 10.2* 9.9*  --  11.2* 10.1*  --  9.7*  --   --   HCT 32.4* 31.3*  --  33.0* 33.2*  --  30.7*  --   --   PLT 230 221  --   --  257  --  190  --   --   APTT 79*  --    < >  --  146*  --  >200*  --  >200*  HEPARINUNFRC 0.90*  --   --   --  0.93*  --  1.08*  --   --   CREATININE 7.82* 8.54*  --   --  8.83* 8.71* 4.78* 3.25*  --    < > = values in this interval not displayed.    Assessment: 38yo female remains supratherapeutic on heparin after rate increase; no gtt issues or signs of bleeding per RN; bleeding at HD site has resolved.  Goal of Therapy:  aPTT 66-102 seconds   Plan:  Will hold heparin gtt x1h then decrease heparin gtt by 4 units/kgABW/hr to 1200 units/hr and check PTT in 8 hours.    Wynona Neat, PharmD, BCPS  04/21/2019,11:54 PM

## 2019-04-21 NOTE — Progress Notes (Signed)
CRITICAL VALUE ALERT  Critical Value:  APTT>200  Date & Time Notied:  04/21/19 [id number]  Provider Notified: Agarwala  Orders Received/Actions taken: No new orders

## 2019-04-21 NOTE — Progress Notes (Signed)
NAME:  Carla Compton, MRN:  [id number], DOB:  May 14, 1981, LOS: 5 ADMISSION DATE:  03/30/2019, CONSULTATION DATE:  04/21/19 CHIEF COMPLAINT:  Hypotension, sepsis   Brief History   38 y.o. F with ESRD and DVT who was transferred from 10/24 from [place], [place] for MRSA bacteremia and intermittent confusion of unclear source.  She has had two negative CT head's, Echo and TEE without vegetation and non-specific findings on CXR.   She has been on Vancomycin since admission.   Over the last several hours, BP has been down-trending to SBP 70-80's with increased confusion. On dialysis MWF and per nursing did not get dialysis today secondary to hypotension .  Given 1L NS without improvement therefore PCCM consulted.  History of present illness     Carla Compton is a 38 y.o. F with PMH of recently diagnosed RLE DVT on Xarelto, ESRD, Type 2 DM, Rheumatoid arthritis, HTN and HL who was initially admitted in [place] 10/19 for septic shock.  No clear source was identified, 4/4 BC grew MRSA and she was transferred to [facility] for ID consult and further management.  Per notes,  She was initially on Levophed and Vancomycin; pressors were weaned and abx changed from Vancomycin to Zyvox and meropenem.  She had initial episodes of confusion and had a negative CT head and spine per notes, RLE US showed some fluid collection concerning for cellulitis, no abscess.  Since admission on 10/24 she has been treated with Vancomycin, TEE negative for vegetations.  Repeat BC are pending and no fevers since admission, WBC up-trending from 9.5->13.0.  Overnight, Pt's SBP has been down-trending from baseline 90-100 to as low as 70/43.  RN notes increasing confusion.   On exam, pt is sleeping but arousable to voice.  She appears obtunded and will nod head and give one-word answers.    Past Medical History   Past Medical History:  Diagnosis Date  . Anemia    2019  . Benign essential HTN   . CHF (congestive heart failure) ([place])   .  CKD (chronic kidney disease) stage 3, GFR 30-59 ml/min   . Diabetes ([place])    Rheumatoid Arthritis   Significant Hospital Events   10/24>>Admitted from OSH in [place] 10/27>>Txfr to PCCM   Consults:  Nephrology ID PCCM  Procedures:  10/27-insertion of temporary hemodialysis catheter right femoral vein  Significant Diagnostic Tests:  10/24 CT head>>No acute findings  10/24 CXR>>Cardiomegaly with mild vascular congestion. Pneumonia is not Excluded. 10/27 CT RLE>> possible right foot osteomyelitis MRI head>>pending  Micro Data:  10/26 BC x2>>negative. 10/26 MRSA screen>>positive 10/24 Sars-CoV-2>>neg  Antimicrobials:  Vancomycin 10/24-  Interim history/subjective:  Tolerating continue hemodialysis.  Weaned off vasopressors.  Complains of generalized pain but unable to localize discomfort.  Objective   Blood pressure 109/63, pulse 73, temperature (!) 94.5 F (34.7 C), temperature source Axillary, resp. rate 16, height 5\' 2"  (1.575 m), weight 119.3 kg, last menstrual period 04/24/2019, SpO2 93 %. on 3L [place]        Intake/Output Summary (Last 24 hours) at 04/21/2019 1141 Last data filed at 04/21/2019 1100 Gross per 24 hour  Intake 2824.6 ml  Output 2280 ml  Net 544.6 ml   Filed Weights   03/27/2019 0638 04/20/19 0107 04/21/19 0500  Weight: 110 kg 116 kg 119.3 kg    General:  Overweight F, sleeping and not in obvious distress, arousable and non-toxic appearing HEENT: MM pink/moist Neuro: awake and follows basic commands, however appears somewhat obtunded and will only  answer yes/no to questions, not otherwise conversational CV: s1s2 RRR, no m/r/g PULM:  No retractions or accessory muscle use, diminished in the bilateral bases GI: soft, bsx4 active, no TTP Extremities: warm/dry, 1+ edema  Skin: no rashes or lesions no obvious foot pain.   Resolved Hospital Problem list     Assessment & Plan:   Was critically ill due to septic Shock secondary to MRSA bacteremia Now  off vasopressors Continuing vancomycin -For MRI of head and foot to complete work-up once stable. -ID to de-escalate antibiotic therapy as appropriate  Encephalopathy  -intermittent since admission, negative head CT -hypercarbia likely contributory  Critically ill due to stage V kidney disease requiring continuous hemodialysis Nephrology following, Dialysis M/W/F however missed yesterday 2/2 hypotension -Fluid removal now that blood pressure has improved.  Seropositive, erosive rheumatoid arthritis -Follows with [facility], last note in 03/2018 was on biologic therapy, however nephrology note from two months ago notes pt takes Plaquenil -does not appear pt has been on any recent steroids to suggest the need for stress-dose steroids inpatient -will need outpatient f/u  Best practice:  Diet: NPO Pain/Anxiety/Delirium protocol (if indicated): n/a VAP protocol (if indicated): n/a DVT prophylaxis: heparin GI prophylaxis: n/a Glucose control: Lantus and SSI Mobility: bed rest Code Status: Full Family Communication: Have updated mother 10/28 Disposition: ICU  Labs   CBC: Recent Labs  Lab 04/17/19 0320 04/18/19 0224 04/23/2019 0249 03/31/2019 1901 04/20/19 0259 04/20/19 0639 04/21/19 0528  WBC 9.5 12.0* 12.8* 13.0*  --  15.3* 9.7  NEUTROABS 7.2  --   --   --   --   --   --   HGB 10.5* 9.9* 10.2* 9.9* 11.2* 10.1* 9.7*  HCT 33.1* 31.0* 32.4* 31.3* 33.0* 33.2* 30.7*  MCV 94.6 94.2 94.2 94.8  --  97.9 94.8  PLT 232 242 230 221  --  257 [id number]    Basic Metabolic Panel: Recent Labs  Lab 04/17/19 0320 04/18/2019 0249 04/04/2019 1901 04/20/19 0259 04/20/19 0639 04/20/19 1532 04/21/19 0528  NA 134* 133* 131* 130* 131* 130* 135  K 3.7 4.8 5.0 5.0 5.3* 6.7* 4.7  CL 94* 93* 92*  --  96* 98 101  CO2 27 23 20*  --  18* 13* 21*  GLUCOSE 92 116* 88  --  147* 113* 100*  BUN 38* 61* 68*  --  74* 74* 38*  CREATININE 5.45* 7.82* 8.54*  --  8.83* 8.71* 4.78*  CALCIUM 8.7*  7.7* 7.6*  --  7.2* 6.7* 7.2*  MG 2.0  --   --   --   --   --  2.3  PHOS 5.4*  --  8.8*  --   --  9.4* 4.7*   GFR: Estimated Creatinine Clearance: 20 mL/min (A) (by C-G formula based on SCr of 4.78 mg/dL (H)). Recent Labs  Lab 03/27/2019 0249 03/28/2019 1901 04/07/2019 2327 04/20/19 0255 04/20/19 0639 04/21/19 0528  WBC 12.8* 13.0*  --   --  15.3* 9.7  LATICACIDVEN  --   --  0.7 0.9  --   --     Liver Function Tests: Recent Labs  Lab 04/17/19 0320 04/24/2019 0249 04/05/2019 1901 04/20/19 0639 04/20/19 1532 04/21/19 0528  AST 16 25  --  46*  --   --   ALT 15 14  --  16  --   --   ALKPHOS 145* 254*  --  393*  --   --   BILITOT 3.7* 3.7*  --  3.7*  --   --   PROT 6.3* 6.3*  --  6.0*  --   --   ALBUMIN 1.7* 1.7* 1.6* 1.7* 1.5* 1.6*   No results for input(s): LIPASE, AMYLASE in the last 168 hours. Recent Labs  Lab 04/20/19 0255  AMMONIA 23    ABG    Component Value Date/Time   PHART 7.217 (L) 04/20/2019 0259   PCO2ART 56.8 (H) 04/20/2019 0259   PO2ART 87.0 04/20/2019 0259   HCO3 23.1 04/20/2019 0259   TCO2 25 04/20/2019 0259   ACIDBASEDEF 5.0 (H) 04/20/2019 0259   O2SAT 94.0 04/20/2019 0259     Coagulation Profile: No results for input(s): INR, PROTIME in the last 168 hours.  Cardiac Enzymes: No results for input(s): CKTOTAL, CKMB, CKMBINDEX, TROPONINI in the last 168 hours.  HbA1C: Hgb A1c MFr Bld  Date/Time Value Ref Range Status  04/17/2019 03:20 AM 6.9 (H) 4.8 - 5.6 % Final    Comment:    (NOTE) Pre diabetes:          5.7%-6.4% Diabetes:              >6.4% Glycemic control for   <7.0% adults with diabetes   05/26/2018 04:57 AM 7.2 (H) 4.8 - 5.6 % Final    Comment:    (NOTE) Pre diabetes:          5.7%-6.4% Diabetes:              >6.4% Glycemic control for   <7.0% adults with diabetes     CBG: Recent Labs  Lab 04/20/19 1132 04/20/19 1604 04/20/19 2032 04/21/19 0017 04/21/19 0807  GLUCAP 124* 120* 115* 112* 86   CRITICAL CARE Performed by:  Kipp Brood   Total critical care time: 35 minutes  Critical care time was exclusive of separately billable procedures and treating other patients.  Critical care was necessary to treat or prevent imminent or life-threatening deterioration.  Critical care was time spent personally by me on the following activities: development of treatment plan with patient and/or surrogate as well as nursing, discussions with consultants, evaluation of patient's response to treatment, examination of patient, obtaining history from patient or surrogate, ordering and performing treatments and interventions, ordering and review of laboratory studies, ordering and review of radiographic studies, pulse oximetry, re-evaluation of patient's condition and participation in multidisciplinary rounds.  Kipp Brood, MD [facility] ICU Physician [place]  Pager: [phone] Mobile: [phone] After hours: [phone].

## 2019-04-21 NOTE — Progress Notes (Signed)
Initial Nutrition Assessment  DOCUMENTATION CODES:   Morbid obesity  INTERVENTION:   Liberalize diet to Regular with 1200 ml fluid restriction  Continue Rena-Vit  Nepro Shake po TID, each supplement provides 425 kcal and 19 grams protein  30 ml Prostat BID, each supplement provides 100 kcals and 15 grams protein.   Recommend considering nutrition support if mental status and po intake do not improve with NG/Cortrak insertion and initiation of EN   NUTRITION DIAGNOSIS:   Increased nutrient needs related to acute illness as evidenced by estimated needs.  GOAL:   Patient will meet greater than or equal to 90% of their needs   MONITOR:   PO intake, Supplement acceptance, Labs, Weight trends, Skin  REASON FOR ASSESSMENT:   Rounds    ASSESSMENT:   38 yo female admitted with critically ill due to septic shock from MRSA bacteremia, encephalopathy AKI requiring CRRT. PMH includes ESRD on HD, DVT, DM, HTN, RA, CHF, anemia  10/24 Admit 10/27 Transferred to ICU, CRRT initiated  Remains on CRRT, attempting 50-100 ml/hr fluid removal  Per RN, pt arousable but does not stay awake for very long. When pt is awake she is confused, sometimes combative. Unable to obtain diet and weight history from patient at this time  Unable to take po this AM due to poor mentation. Pt ate 100% of meals on day of admission on 10/24. Recorded po intake 0-30% since then  Admission weight 111.6 kg; current wt 119.3 kg. +4 L. Pt is Anuric  Labs: phosphorus 4.7 (acceptable for HD), potassium wdl, CBGs 76-124  Meds: calcitriol, aranesp, Rena-Vit, Renvela  NUTRITION - FOCUSED PHYSICAL EXAM:  Unable to assess, pt on Isolaiton  Diet Order:  RENAL/Carb Modified diet with 1200 mL FLUID RESTRICTION  EDUCATION NEEDS:   Not appropriate for education at this time  Skin:  Skin Assessment: Reviewed RN Assessment  Last BM:  10/28  Height:   Ht Readings from Last 1 Encounters:  04/05/2019 5\' 2"   (1.575 m)    Weight:   Wt Readings from Last 1 Encounters:  04/21/19 119.3 kg    Ideal Body Weight:  50 kg  BMI:  Body mass index is 48.1 kg/m.  Estimated Nutritional Needs:   Kcal:  2200-2400 kcals  Protein:  110-130 g  Fluid:  1000 mL plus UPOP   [employer] MS, RDN, LDN, CNSC [phone] Pager  [phone] Weekend/On-Call Pager

## 2019-04-21 NOTE — Progress Notes (Signed)
Pt's family requests Bryson Ha (sister) be the primary caller during the daytime as pt's mother is normally working.

## 2019-04-21 NOTE — Progress Notes (Signed)
Pt continues to remove bairhugger & warming blankets. Mittens applied. Pt still removing warming devices. Will continue to reinforce need for warming apparatuses.

## 2019-04-21 NOTE — Progress Notes (Signed)
[facility] for Infectious Disease  Date of Admission:  04/18/2019      Total days of antibiotics 10 Vancomycin  Day 2 cefepime + metronidazole           ASSESSMENT: Carla Compton is a 38 y.o. female with known MRSA bacteremia due to unexplained source (TEE negative, CT scan of T-L spine and RLE unrevealing). Required transfer to ICU for CVVHD d/t hyperkalemia, signs of fluid overload, acid base imbalances. Antibiotics were broadened to include cefepime + metronidazole in addition to vancomycin with concern for worsening sepsis related to alternative pathogen/process.   Over the last 12 hours her acidosis, pressor requirement and oxygen need resolved. Would consider repeating her CXR this afternoon to follow after more time to pull fluid. Lack of cough and rapid improvement on dialysis makes pneumonia less likely; will stop cefepime and metronidazole and target known MRSA bacteremia.   Source of infection remains unclear. Likely plan for 4 weeks for complicated bacteremia given repeated positive cultures at 24h.    PLAN: 1. Stop cefepime 2. Stop metronidazole 3. Continue vancomycin  4. CRRT parameters per nephrology  5. 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 allopurinol  100 mg Oral BID  . aspirin EC  81 mg Oral Daily  . calcitRIOL  0.5 mcg Oral Q M,W,F-HD  . Chlorhexidine Gluconate Cloth  6 each Topical Daily  . darbepoetin (ARANESP) injection - DIALYSIS  60 mcg Intravenous Q Tue-HD  . feeding supplement (PRO-STAT SUGAR FREE 64)  30 mL Oral BID  . insulin aspart  0-9 Units Subcutaneous TID WC  . multivitamin  1 tablet Oral QHS  . rosuvastatin  40 mg Oral QHS  . sevelamer carbonate   800 mg Oral TID WC    SUBJECTIVE: Slept OK last night. Still with a large amount of generalized, non-localized pain today. Does not tolerate moving/turning.   Interval Events:  BM yesterday.  Hypothermic while on CRRT --> Bair hugger and fluid warmer applied Net UF hourly on CRRT 50-100 She is now off levophed and oxygen.  Discussed with her nurse today - still moments of confusion / disorientation (stated 2013 for year).    Review of Systems: Review of Systems  Constitutional: Positive for malaise/fatigue. Negative for fever.  Respiratory: Negative for cough, sputum production and shortness of breath.   Cardiovascular: Negative for leg swelling.  Gastrointestinal: Negative for abdominal pain, diarrhea and vomiting.  Musculoskeletal:       Generalized pain   Neurological: Negative for focal weakness and headaches.    Allergies  Allergen Reactions  . Contrast Media [Iodinated Diagnostic Agents] Other (See Comments)    Affected her kidneys  . Pepto-Bismol [Bismuth] Other (See Comments)    Unknown reaction    OBJECTIVE: Vitals:   04/21/19 0915 04/21/19 0930 04/21/19 0934 04/21/19 0945  BP: 111/73 (!) 73/60 109/64 104/62  Pulse: 71 72 72 72  Resp: 14 16 18  (!) 0  Temp:      TempSrc:      SpO2: 94% 95% 95% 94%  Weight:      Height:       Body mass index is 48.1 kg/m.  Physical Exam Vitals signs and nursing note reviewed.  Constitutional:      Comments: Resting in bed, sleepy but easily awakens. No distress.    HENT:     Mouth/Throat:     Mouth: Mucous membranes are dry.     Pharynx: Oropharynx is clear.  Eyes:     General: No scleral icterus.    Pupils: Pupils are equal, round, and reactive to light.  Neck:     Musculoskeletal: Neck supple.     Comments: Dressing in place Cardiovascular:     Rate and Rhythm: Normal rate and regular rhythm.     Heart sounds: No murmur.  Pulmonary:     Effort: Pulmonary effort is normal.     Breath sounds: No wheezing or  rhonchi.     Comments: RA Abdominal:     General: There is no distension.     Tenderness: There is no abdominal tenderness.  Skin:    General: Skin is warm and dry.     Capillary Refill: Capillary refill takes less than 2 seconds.     Findings: No erythema or rash.  Neurological:     Mental Status: She is oriented to person, place, and time.     Lab Results Lab Results  Component Value Date   WBC 9.7 04/21/2019   HGB 9.7 (L) 04/21/2019   HCT 30.7 (L) 04/21/2019   MCV 94.8 04/21/2019   PLT 190 04/21/2019    Lab Results  Component Value Date   CREATININE 4.78 (H) 04/21/2019   BUN 38 (H) 04/21/2019   NA 135 04/21/2019   K 4.7 04/21/2019   CL 101 04/21/2019   CO2 21 (L) 04/21/2019    Lab Results  Component Value Date   ALT 16 04/20/2019   AST 46 (H) 04/20/2019   ALKPHOS 393 (H) 04/20/2019   BILITOT 3.7 (H) 04/20/2019     Microbiology: Recent Results (from the past 240 hour(s))  SARS CORONAVIRUS 2 (TAT 6-24 HRS) Nasopharyngeal Nasopharyngeal Swab     Status: None   Collection Time: 04/17/19 12:27 AM   Specimen: Nasopharyngeal Swab  Result Value Ref Range Status   SARS Coronavirus 2 NEGATIVE NEGATIVE Final    Comment: (NOTE) SARS-CoV-2 target nucleic acids are NOT DETECTED. The SARS-CoV-2 RNA is generally detectable in upper and lower respiratory specimens during the acute phase of infection. Negative results do not preclude SARS-CoV-2 infection, do not rule out co-infections with other pathogens, and should not be used as the sole basis for treatment or other patient management decisions. Negative results must be combined with clinical observations, patient history, and epidemiological information. The expected result is Negative. Fact Sheet for Patients: SugarRoll.be Fact Sheet for Healthcare Providers: https://www.woods-mathews.com/ This test is not yet approved or cleared by the [place] FDA and  has been  authorized for detection and/or diagnosis of SARS-CoV-2 by FDA under an Emergency Use Authorization (EUA). This EUA will remain  in effect (meaning this test can be used) for the duration of the COVID-19 declaration under Section 56 4(b)(1) of the Act, 21 U.S.C. section 360bbb-3(b)(1), unless the authorization is terminated or revoked sooner. Performed at [facility], [place] [address], [place], [place] [address]   Culture, blood (routine x 2)     Status: None (Preliminary result)   Collection Time: 04/18/19  8:29 AM   Specimen: BLOOD RIGHT HAND  Result Value Ref Range Status   Specimen Description BLOOD RIGHT HAND  Final   Special Requests  Final    AEROBIC BOTTLE ONLY Blood Culture results may not be optimal due to an inadequate volume of blood received in culture bottles   Culture   Final    NO GROWTH 2 DAYS Performed at [facility] [address] [place], [place] [address]    Report Status PENDING  Incomplete  Culture, blood (routine x 2)     Status: None (Preliminary result)   Collection Time: 04/18/19  5:41 PM   Specimen: BLOOD RIGHT HAND  Result Value Ref Range Status   Specimen Description BLOOD RIGHT HAND  Final   Special Requests   Final    AEROBIC BOTTLE ONLY Blood Culture results may not be optimal due to an inadequate volume of blood received in culture bottles   Culture   Final    NO GROWTH 2 DAYS Performed at [facility], [place] [address], [place], [place] [address]    Report Status PENDING  Incomplete  MRSA PCR Screening     Status: Abnormal   Collection Time: 04/16/2019  9:26 AM   Specimen: Nasopharyngeal  Result Value Ref Range Status   MRSA by PCR POSITIVE (A) NEGATIVE Final    Comment:        The GeneXpert MRSA Assay (FDA approved for NASAL specimens only), is one component of a comprehensive MRSA colonization surveillance program. It is not intended to diagnose MRSA infection nor to guide or monitor treatment for MRSA infections.  RESULT CALLED TO, READ BACK BY AND VERIFIED WITH: Jefferson Fuel RN 12:05 04/03/2019 ([email or web address]) Performed at [facility], [place] [address], [place], [place] [address]     Janene Madeira, MSN, NP-C [facility] for Infectious Disease [facility] Cell: [phone] Pager: [phone]  @TODAY @ 10:01 AM

## 2019-04-21 NOTE — Progress Notes (Signed)
ANTICOAGULATION CONSULT NOTE - Follow Up Consult  Pharmacy Consult for Heparin Indication: DVT   Patient Measurements: Height: 5\' 2"  (157.5 cm) Weight: 263 lb 0.1 oz (119.3 kg) IBW/kg (Calculated) : 50.1 Heparin Dosing Weight: 77.3 kg  Vital Signs: Temp: 94.5 F (34.7 C) (10/28 0757) Temp Source: Axillary (10/28 0757) BP: 96/65 (10/28 1100) Pulse Rate: 69 (10/28 1100)  Labs: Recent Labs    04/01/2019 0249 03/25/2019 1901 04/17/2019 2356 04/20/19 0259 04/20/19 0639 04/20/19 1532 04/21/19 0528  HGB 10.2* 9.9*  --  11.2* 10.1*  --  9.7*  HCT 32.4* 31.3*  --  33.0* 33.2*  --  30.7*  PLT 230 221  --   --  257  --  190  APTT 79*  --  72*  --  146*  --  >200*  HEPARINUNFRC 0.90*  --   --   --  0.93*  --  1.08*  CREATININE 7.82* 8.54*  --   --  8.83* 8.71* 4.78*     Assessment: Heparin drip for RLE DVT, was on Xarelto prior to admission,  LD 10/23. Heparin levels remain elevated due to Xarelto interaction, will use aPTTs for the time being. Her hgb is low stable 9.7, plts wnl. Her aPTT is > 200 (SUPRAtherapeutic). I discussed with the RN, the patient is having some bleeding from her HD access site, but nowhere else.    Goal of Therapy:  aPTT 66-102 seconds Monitor platelets by anticoagulation protocol: Yes    Plan:  -Hold heparin for one hour -Reduce heparin to 1500 units/hr -Daily HL, aPTT, CBC -Check aPTT in 8 hours -Discussed plan with RN    Harvel Quale 04/21/2019 11:17 AM

## 2019-04-21 NOTE — Progress Notes (Signed)
[facility] ROUNDING NOTE   Subjective:   This is a 38 year old female with a history of end-stage renal disease history of DVT diabetes hypertension rheumatoid arthritis and MRSA bacteremia of unclear etiology.  She developed transient hypotension requiring low-dose pressors and was transferred to the intensive care unit.  Patient decompensated with  Shock and bradycardia 04/20/2019.  Hyperkalemia initiation of CRRT 04/20/2019  Blood pressure 111/73 pulse 73 temperature 94.5 O2 sats 94% room air Being kept even on CRRT   Sodium 135 potassium 4.7 chloride 101 CO2 21 BUN 38 creatinine 4.78 glucose 100 calcium 7.2 phosphorus 4.7 albumin 1.7 WBC 9.7 hemoglobin 9.7 platelets 190  IV Levophed IV sodium bicarbonate IV cefepime IV vancomycin  Allopurinol 100 mg twice daily aspirin 81 mg daily Calcitrol 0.5 mcg Monday Wednesday Friday  darbepoetin 60 mcg q. Tuesday, multivitamins 1 daily Crestor 40 mg daily Renvela 800 mg with meals  Objective:  Vital signs in last 24 hours:  Temp:  [94.5 F (34.7 C)-97.7 F (36.5 C)] 94.5 F (34.7 C) (10/28 0757) Pulse Rate:  [60-84] 71 (10/28 0915) Resp:  [7-26] 14 (10/28 0915) BP: (77-130)/(50-96) 111/73 (10/28 0915) SpO2:  [89 %-100 %] 94 % (10/28 0915) Weight:  [119.3 kg] 119.3 kg (10/28 0500)  Weight change: 3.3 kg Filed Weights   03/26/2019 0638 04/20/19 0107 04/21/19 0500  Weight: 110 kg 116 kg 119.3 kg    Intake/Output: I/O last 3 completed shifts: In: 4539.6 [I.V.:3139.3; IV Piggyback:1400.3] Out: 1823 [id number]   Intake/Output this shift:  Total I/O In: 155.8 [I.V.:155.8] Out: 145 [Other:145]  General:  AAOx3 NAD HEENT: MMM [place] AT anicteric sclera Neck:  No JVD, no adenopathy CV:  Heart RRR  Lungs:  L/S CTA bilaterally Abd:  abd SNT/ND with normal BS GU:  Bladder non-palpable Extremities:  No LE edema. Skin:  No skin rash   Basic Metabolic Panel: Recent Labs  Lab 04/17/19 0320 04/06/2019 0249  04/10/2019 1901 04/20/19 0259 04/20/19 0639 04/20/19 1532 04/21/19 0528  NA 134* 133* 131* 130* 131* 130* 135  K 3.7 4.8 5.0 5.0 5.3* 6.7* 4.7  CL 94* 93* 92*  --  96* 98 101  CO2 27 23 20*  --  18* 13* 21*  GLUCOSE 92 116* 88  --  147* 113* 100*  BUN 38* 61* 68*  --  74* 74* 38*  CREATININE 5.45* 7.82* 8.54*  --  8.83* 8.71* 4.78*  CALCIUM 8.7* 7.7* 7.6*  --  7.2* 6.7* 7.2*  MG 2.0  --   --   --   --   --  2.3  PHOS 5.4*  --  8.8*  --   --  9.4* 4.7*    Liver Function Tests: Recent Labs  Lab 04/17/19 0320 03/30/2019 0249 03/30/2019 1901 04/20/19 0639 04/20/19 1532 04/21/19 0528  AST 16 25  --  46*  --   --   ALT 15 14  --  16  --   --   ALKPHOS 145* 254*  --  393*  --   --   BILITOT 3.7* 3.7*  --  3.7*  --   --   PROT 6.3* 6.3*  --  6.0*  --   --   ALBUMIN 1.7* 1.7* 1.6* 1.7* 1.5* 1.6*   No results for input(s): LIPASE, AMYLASE in the last 168 hours. Recent Labs  Lab 04/20/19 0255  AMMONIA 23    CBC: Recent Labs  Lab 04/17/19 0320 04/18/19 0224 04/03/2019 0249 04/18/2019 1901 04/20/19 0259  04/20/19 0639 04/21/19 0528  WBC 9.5 12.0* 12.8* 13.0*  --  15.3* 9.7  NEUTROABS 7.2  --   --   --   --   --   --   HGB 10.5* 9.9* 10.2* 9.9* 11.2* 10.1* 9.7*  HCT 33.1* 31.0* 32.4* 31.3* 33.0* 33.2* 30.7*  MCV 94.6 94.2 94.2 94.8  --  97.9 94.8  PLT 232 242 230 221  --  257 190    Cardiac Enzymes: No results for input(s): CKTOTAL, CKMB, CKMBINDEX, TROPONINI in the last 168 hours.  BNP: Invalid input(s): POCBNP  CBG: Recent Labs  Lab 04/20/19 1132 04/20/19 1604 04/20/19 2032 04/21/19 0017 04/21/19 0807  GLUCAP 124* 120* 115* 112* 86    Microbiology: Results for orders placed or performed during the hospital encounter of 04/02/2019  SARS CORONAVIRUS 2 (TAT 6-24 HRS) Nasopharyngeal Nasopharyngeal Swab     Status: None   Collection Time: 04/17/19 12:27 AM   Specimen: Nasopharyngeal Swab  Result Value Ref Range Status   SARS Coronavirus 2 NEGATIVE NEGATIVE Final     Comment: (NOTE) SARS-CoV-2 target nucleic acids are NOT DETECTED. The SARS-CoV-2 RNA is generally detectable in upper and lower respiratory specimens during the acute phase of infection. Negative results do not preclude SARS-CoV-2 infection, do not rule out co-infections with other pathogens, and should not be used as the sole basis for treatment or other patient management decisions. Negative results must be combined with clinical observations, patient history, and epidemiological information. The expected result is Negative. Fact Sheet for Patients: SugarRoll.be Fact Sheet for Healthcare Providers: https://www.woods-mathews.com/ This test is not yet approved or cleared by the [place] FDA and  has been authorized for detection and/or diagnosis of SARS-CoV-2 by FDA under an Emergency Use Authorization (EUA). This EUA will remain  in effect (meaning this test can be used) for the duration of the COVID-19 declaration under Section 56 4(b)(1) of the Act, 21 U.S.C. section 360bbb-3(b)(1), unless the authorization is terminated or revoked sooner. Performed at [facility], [place] [address] [place], [place] [address]   Culture, blood (routine x 2)     Status: None (Preliminary result)   Collection Time: 04/18/19  8:29 AM   Specimen: BLOOD RIGHT HAND  Result Value Ref Range Status   Specimen Description BLOOD RIGHT HAND  Final   Special Requests   Final    AEROBIC BOTTLE ONLY Blood Culture results may not be optimal due to an inadequate volume of blood received in culture bottles   Culture   Final    NO GROWTH 2 DAYS Performed at [facility], [place] [address] [place], [place] [address]    Report Status PENDING  Incomplete  Culture, blood (routine x 2)     Status: None (Preliminary result)   Collection Time: 04/18/19  5:41 PM   Specimen: BLOOD RIGHT HAND  Result Value Ref Range Status   Specimen Description BLOOD RIGHT HAND   Final   Special Requests   Final    AEROBIC BOTTLE ONLY Blood Culture results may not be optimal due to an inadequate volume of blood received in culture bottles   Culture   Final    NO GROWTH 2 DAYS Performed at [facility], [place] [address], [place], [place] [address]    Report Status PENDING  Incomplete  MRSA PCR Screening     Status: Abnormal   Collection Time: 04/09/2019  9:26 AM   Specimen: Nasopharyngeal  Result Value Ref Range  Status   MRSA by PCR POSITIVE (A) NEGATIVE Final    Comment:        The GeneXpert MRSA Assay (FDA approved for NASAL specimens only), is one component of a comprehensive MRSA colonization surveillance program. It is not intended to diagnose MRSA infection nor to guide or monitor treatment for MRSA infections. RESULT CALLED TO, READ BACK BY AND VERIFIED WITH: Jefferson Fuel RN 12:05 03/29/2019 ([email or web address]) Performed at [facility], [place] [address] [place], [place] [address]     Coagulation Studies: No results for input(s): LABPROT, INR in the last 72 hours.  Urinalysis: No results for input(s): COLORURINE, LABSPEC, PHURINE, GLUCOSEU, HGBUR, BILIRUBINUR, KETONESUR, PROTEINUR, UROBILINOGEN, NITRITE, LEUKOCYTESUR in the last 72 hours.  Invalid input(s): APPERANCEUR    Imaging: Dg Chest Port 1 View  Result Date: 04/20/2019 CLINICAL DATA:  Hypoxia EXAM: PORTABLE CHEST 1 VIEW COMPARISON:  04/17/2019 FINDINGS: Cardiac shadow is enlarged but stable. Right jugular catheter has been removed in the interval. The inspiratory effort is poor with patchy parenchymal opacities within both lungs but worst in the right upper lobe consistent with multifocal pneumonia. No bony abnormality is noted. IMPRESSION: Patchy multifocal opacities consistent with pneumonia. Electronically Signed   By: Inez Catalina M.D.   On: 04/20/2019 01:10   Ct Extremity Lower Right W Contrast  Result Date: 04/20/2019 CLINICAL DATA:  Shock with right hip pain. EXAM: CT OF THE LOWER  RIGHT EXTREMITY WITH CONTRAST TECHNIQUE: Multidetector CT imaging of the lower right extremity was performed according to the standard protocol following intravenous contrast administration. COMPARISON:  Right foot MRI 06/08/2018. CT of the right thigh to April 13, 2015 CONTRAST:  58mL OMNIPAQUE IOHEXOL 300 MG/ML  SOLN FINDINGS: Bones/Joint/Cartilage No erosion or fracture seen at the hip. Well-circumscribed subchondral cystic change at the knee with joint space narrowing, chronic appearing and attributed to osteoarthritis. Multiple generalized central erosions at the Lisfranc joint and at the great toe interphalangeal joint, also seen on comparison MRI when midfoot erosions were attributed to infection. This could also be neurogenic arthropathy, although no progression or dislocation. HD related amyloid arthropathy is also considered given relative stability. No visible hip joint effusion.  Small knee joint effusion. Ligaments Suboptimally assessed by CT. Muscles and Tendons No visible pyomyositis or clear inter fascial edema. 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 Soft tissues Subcutaneous edema at the plantar foot, calf, MRI lateral thigh. No soft tissue gas. Low-density rounded collection like appearance measuring 2 cm below the fifth MTP joint with limited peripheral enhancement, stable from 2019 and likely pressure related. No adjacent ulceration is seen. Atherosclerotic calcification, premature. IMPRESSION: 1. Areas of subcutaneous edema in the lateral thigh, calf, and foot which could reflect mild cellulitis. 2. Lisfranc and great toe interphalangeal central erosions with plantar collection beneath the fifth MTP joint, chronic when compared to 2019 foot MRI. 3. 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 No pyomyositis or other deep space infection identified. 4. 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 Electronically  Signed   By: Monte Fantasia M.D.   On: 04/20/2019 04:18     Medications:   .  prismasol BGK 4/2.5 500 mL/hr at 04/21/19 0133  .  prismasol BGK 4/2.5 500 mL/hr at 04/21/19 0133  . sodium chloride    . ceFEPime (MAXIPIME) IV Stopped (04/20/19 2244)  . heparin 1,700 Units/hr (04/21/19 0900)  . metronidazole Stopped (04/21/19 0238)  . norepinephrine (  LEVOPHED) Adult infusion 1 mcg/min (04/21/19 0900)  . prismasol BGK 4/2.5 2,000 mL/hr at 04/21/19 0857  .  sodium bicarbonate (isotonic) infusion in sterile water 50 mL/hr at 04/21/19 0900  . vancomycin Stopped (04/20/19 1910)   . allopurinol  100 mg Oral BID  . aspirin EC  81 mg Oral Daily  . calcitRIOL  0.5 mcg Oral Q M,W,F-HD  . Chlorhexidine Gluconate Cloth  6 each Topical Daily  . darbepoetin (ARANESP) injection - DIALYSIS  60 mcg Intravenous Q Tue-HD  . feeding supplement (PRO-STAT SUGAR FREE 64)  30 mL Oral BID  . insulin aspart  0-9 Units Subcutaneous TID WC  . multivitamin  1 tablet Oral QHS  . rosuvastatin  40 mg Oral QHS  . sevelamer carbonate  800 mg Oral TID WC   acetaminophen **OR** acetaminophen, alteplase, fentaNYL (SUBLIMAZE) injection, heparin, ondansetron **OR** ondansetron (ZOFRAN) IV, sodium chloride, traMADol  Assessment/ Plan:  1. MRSA bacteremia- persistent without obvious source  - ID consulted and TEE negative for vegetations. She did have a prior MRSA hand wound 07/2018.  MRI of hip planned looking for untreated source of infection 2. ESRD- MWF -[facility] -hypotension and shock initiated CRRT 04/20/2019. 3. Hypertension/volume-we will gently try to remove fluid 50 to 100 cc an hour 4. Anemia- hgb 10.5>9.9 Started Aranesp 60 Tuesday  5. Metabolic bone disease- Continue calcitriol with HD.    Renvela added as binder  6. Nutrition - alb low - renal carb mod diet - add protein suppl 7. Transient confusion and agitation during prior acute admission prior to transfer to  [facility] 8. DM/gout/GERD -meds per primary 9. Rheumatoid arthritis -stable not issue at this time 10.Recently diagnosed RLE DVT 04/11/19 - heparin IV for now 11.Elevated T. Bili - present at transfer     LOS: [place] @TODAY @9 :30 AM

## 2019-04-21 NOTE — Progress Notes (Signed)
Verbal order from Dr. Justin Mend to pull 50-100cc of fluid per hour.

## 2019-04-22 DIAGNOSIS — R7881 Bacteremia: Secondary | ICD-10-CM | POA: Diagnosis not present

## 2019-04-22 DIAGNOSIS — B9562 Methicillin resistant Staphylococcus aureus infection as the cause of diseases classified elsewhere: Secondary | ICD-10-CM | POA: Diagnosis not present

## 2019-04-22 DIAGNOSIS — N179 Acute kidney failure, unspecified: Secondary | ICD-10-CM

## 2019-04-22 DIAGNOSIS — A419 Sepsis, unspecified organism: Secondary | ICD-10-CM | POA: Diagnosis not present

## 2019-04-22 DIAGNOSIS — L089 Local infection of the skin and subcutaneous tissue, unspecified: Secondary | ICD-10-CM

## 2019-04-22 DIAGNOSIS — R4182 Altered mental status, unspecified: Secondary | ICD-10-CM

## 2019-04-22 LAB — RENAL FUNCTION PANEL
Albumin: 1.8 g/dL — ABNORMAL LOW (ref 3.5–5.0)
Albumin: 1.6 g/dL — ABNORMAL LOW (ref 3.5–5.0)
Anion gap: 11 (ref 5–15)
Anion gap: 15 (ref 5–15)
BUN: 12 mg/dL (ref 6–20)
BUN: 20 mg/dL (ref 6–20)
CO2: 24 mmol/L (ref 22–32)
CO2: 20 mmol/L — ABNORMAL LOW (ref 22–32)
Calcium: 7.8 mg/dL — ABNORMAL LOW (ref 8.9–10.3)
Calcium: 8.3 mg/dL — ABNORMAL LOW (ref 8.9–10.3)
Chloride: 101 mmol/L (ref 98–111)
Chloride: 101 mmol/L (ref 98–111)
Creatinine, Ser: 2.33 mg/dL — ABNORMAL HIGH (ref 0.44–1.00)
Creatinine, Ser: 2.82 mg/dL — ABNORMAL HIGH (ref 0.44–1.00)
GFR calc Af Amer: 30 mL/min — ABNORMAL LOW (ref >60)
GFR calc Af Amer: 24 mL/min — ABNORMAL LOW (ref >60)
GFR calc non Af Amer: 26 mL/min — ABNORMAL LOW (ref >60)
GFR calc non Af Amer: 21 mL/min — ABNORMAL LOW (ref >60)
Glucose, Bld: 79 mg/dL (ref 70–99)
Glucose, Bld: 156 mg/dL — ABNORMAL HIGH (ref 70–99)
Phosphorus: 2.4 mg/dL — ABNORMAL LOW (ref 2.5–4.6)
Phosphorus: 2.9 mg/dL (ref 2.5–4.6)
Potassium: 4.3 mmol/L (ref 3.5–5.1)
Potassium: 4.4 mmol/L (ref 3.5–5.1)
Sodium: 136 mmol/L (ref 135–145)
Sodium: 136 mmol/L (ref 135–145)

## 2019-04-22 LAB — POCT I-STAT 7, (LYTES, BLD GAS, ICA,H+H)
Acid-base deficit: 6 mmol/L — ABNORMAL HIGH (ref 0.0–2.0)
Bicarbonate: 19.9 mmol/L — ABNORMAL LOW (ref 20.0–28.0)
Calcium, Ion: 1.18 mmol/L (ref 1.15–1.40)
HCT: 31 % — ABNORMAL LOW (ref 36.0–46.0)
Hemoglobin: 10.5 g/dL — ABNORMAL LOW (ref 12.0–15.0)
O2 Saturation: 96 %
Patient temperature: 92
Potassium: 3.5 mmol/L (ref 3.5–5.1)
Sodium: 146 mmol/L — ABNORMAL HIGH (ref 135–145)
TCO2: 21 mmol/L — ABNORMAL LOW (ref 22–32)
pCO2 arterial: 34.7 mmHg (ref 32.0–48.0)
pH, Arterial: 7.348 — ABNORMAL LOW (ref 7.350–7.450)
pO2, Arterial: 75 mmHg — ABNORMAL LOW (ref 83.0–108.0)

## 2019-04-22 LAB — CBC
HCT: 32 % — ABNORMAL LOW (ref 36.0–46.0)
Hemoglobin: 10 g/dL — ABNORMAL LOW (ref 12.0–15.0)
MCH: 30 pg (ref 26.0–34.0)
MCHC: 31.3 g/dL (ref 30.0–36.0)
MCV: 96.1 fL (ref 80.0–100.0)
Platelets: 223 10*3/uL (ref 150–400)
RBC: 3.33 MIL/uL — ABNORMAL LOW (ref 3.87–5.11)
RDW: 16.3 % — ABNORMAL HIGH (ref 11.5–15.5)
WBC: 10.6 10*3/uL — ABNORMAL HIGH (ref 4.0–10.5)
nRBC: 0 % (ref 0.0–0.2)

## 2019-04-22 LAB — GLUCOSE, CAPILLARY
Glucose-Capillary: 108 mg/dL — ABNORMAL HIGH (ref 70–99)
Glucose-Capillary: 128 mg/dL — ABNORMAL HIGH (ref 70–99)
Glucose-Capillary: 80 mg/dL (ref 70–99)
Glucose-Capillary: 98 mg/dL (ref 70–99)

## 2019-04-22 LAB — HEPARIN LEVEL (UNFRACTIONATED)
Heparin Unfractionated: 0.69 IU/mL (ref 0.30–0.70)
Heparin Unfractionated: 0.59 IU/mL (ref 0.30–0.70)

## 2019-04-22 LAB — MAGNESIUM: Magnesium: 2.5 mg/dL — ABNORMAL HIGH (ref 1.7–2.4)

## 2019-04-22 LAB — APTT: aPTT: 191 seconds (ref 24–36)

## 2019-04-22 MED ORDER — VANCOMYCIN HCL IN DEXTROSE 1-5 GM/200ML-% IV SOLN
1000.0000 mg | INTRAVENOUS | Status: DC
  Administered 2019-04-23: 1000 mg via INTRAVENOUS
  Filled 2019-04-22: qty 200

## 2019-04-22 MED ORDER — CHLORHEXIDINE GLUCONATE CLOTH 2 % EX PADS
6.0000 | MEDICATED_PAD | Freq: Every day | CUTANEOUS | Status: DC
  Administered 2019-04-24: 19:00:00 6 via TOPICAL

## 2019-04-22 MED ORDER — HALOPERIDOL LACTATE 5 MG/ML IJ SOLN
5.0000 mg | Freq: Four times a day (QID) | INTRAMUSCULAR | Status: DC | PRN
  Administered 2019-04-22: 09:00:00 5 mg via INTRAVENOUS

## 2019-04-22 MED ORDER — QUETIAPINE FUMARATE 50 MG PO TABS
50.0000 mg | ORAL_TABLET | Freq: Two times a day (BID) | ORAL | Status: DC
  Administered 2019-04-22 (×2): 50 mg via ORAL
  Filled 2019-04-22 (×2): qty 1

## 2019-04-22 MED ORDER — GABAPENTIN 600 MG PO TABS
300.0000 mg | ORAL_TABLET | Freq: Three times a day (TID) | ORAL | Status: DC
  Administered 2019-04-22 – 2019-04-24 (×4): 300 mg via ORAL
  Filled 2019-04-22 (×9): qty 0.5

## 2019-04-22 NOTE — Progress Notes (Addendum)
[facility] ROUNDING NOTE   Subjective:   This is a 38 year old female with a history of end-stage renal disease history of DVT diabetes hypertension rheumatoid arthritis and MRSA bacteremia of unclear etiology.  She developed transient hypotension requiring low-dose pressors and was transferred to the intensive care unit.  Patient decompensated with  Shock and bradycardia 04/20/2019.  Hyperkalemia initiation of CRRT 04/20/2019 until 04/22/2019.  Patient was seen on heme intermittent hemodialysis 04/23/2019  Blood pressure 86/44 pulse 69 temperature 99 O2 sats 100% room air  Sodium 137 potassium 4.2 chloride 102 CO2 20 BUN 29 creatinine 3.29 glucose 134 calcium 8.5 phosphorus 3.0 magnesium 2.7 WBC 9.0 hemoglobin 8.6 platelets 193  Allopurinol 100 mg twice daily aspirin 81 mg daily Calcitrol 0.5 mcg Monday Wednesday Friday  darbepoetin 60 mcg q. Tuesday, multivitamins 1 daily Crestor 40 mg daily Renvela 800 mg with meals  Objective:  Vital signs in last 24 hours:  Temp:  [92.4 F (33.6 C)-98.3 F (36.8 C)] 98.3 F (36.8 C) (10/28 2039) Pulse Rate:  [30-80] 72 (10/29 0900) Resp:  [5-26] 17 (10/29 0900) BP: (81-189)/(50-94) 189/94 (10/29 0900) SpO2:  [85 %-100 %] 98 % (10/29 0900) Weight:  [115.1 kg] 115.1 kg (10/29 0339)  Weight change: -4.2 kg Filed Weights   04/20/19 0107 04/21/19 0500 04/22/19 0339  Weight: 116 kg 119.3 kg 115.1 kg    Intake/Output: I/O last 3 completed shifts: In: 2145.3 [P.O.:20; I.V.:1691.1; IV Piggyback:434.2] Out: [id number] [Other:4275]   Intake/Output this shift:  Total I/O In: 10 [I.V.:10] Out: -   General:  AAOx3 NAD HEENT: MMM [place] AT anicteric sclera Neck:  No JVD, no adenopathy CV:  Heart RRR  Lungs:  L/S CTA bilaterally Abd:  abd SNT/ND with normal BS GU:  Bladder non-palpable Extremities:  No LE edema. Skin:  No skin rash   Basic Metabolic Panel: Recent Labs  Lab 04/17/19 0320  04/23/2019 1901  04/20/19 0639 04/20/19 1532  04/21/19 0528 04/21/19 1604 04/22/19 0133 04/22/19 0341  NA 134*   < > 131*   < > 131* 130* 135 135 146* 136  K 3.7   < > 5.0   < > 5.3* 6.7* 4.7 4.4 3.5 4.3  CL 94*   < > 92*  --  96* 98 101 99  --  101  CO2 27   < > 20*  --  18* 13* 21* 23  --  24  GLUCOSE 92   < > 88  --  147* 113* 100* 86  --  79  BUN 38*   < > 68*  --  74* 74* 38* 23*  --  12  CREATININE 5.45*   < > 8.54*  --  8.83* 8.71* 4.78* 3.25*  --  2.33*  CALCIUM 8.7*   < > 7.6*  --  7.2* 6.7* 7.2* 7.6*  --  7.8*  MG 2.0  --   --   --   --   --  2.3  --   --  2.5*  PHOS 5.4*  --  8.8*  --   --  9.4* 4.7* 3.4  --  2.4*   < > = values in this interval not displayed.    Liver Function Tests: Recent Labs  Lab 04/17/19 0320 04/09/2019 0249  04/20/19 [id number] 04/20/19 1532 04/21/19 0528 04/21/19 1604 04/22/19 0341  AST 16 25  --  46*  --   --   --   --   ALT 15 14  --  16  --   --   --   --   ALKPHOS 145* 254*  --  393*  --   --   --   --   BILITOT 3.7* 3.7*  --  3.7*  --   --   --   --   PROT 6.3* 6.3*  --  6.0*  --   --   --   --   ALBUMIN 1.7* 1.7*   < > 1.7* 1.5* 1.6* 1.7* 1.8*   < > = values in this interval not displayed.   No results for input(s): LIPASE, AMYLASE in the last 168 hours. Recent Labs  Lab 04/20/19 0255  AMMONIA 23    CBC: Recent Labs  Lab 04/17/19 0320  04/20/2019 0249 03/28/2019 1901 04/20/19 0259 04/20/19 0639 04/21/19 0528 04/22/19 0133 04/22/19 0341  WBC 9.5   < > 12.8* 13.0*  --  15.3* 9.7  --  10.6*  NEUTROABS 7.2  --   --   --   --   --   --   --   --   HGB 10.5*   < > 10.2* 9.9* 11.2* 10.1* 9.7* 10.5* 10.0*  HCT 33.1*   < > 32.4* 31.3* 33.0* 33.2* 30.7* 31.0* 32.0*  MCV 94.6   < > 94.2 94.8  --  97.9 94.8  --  96.1  PLT 232   < > 230 221  --  257 190  --  223   < > = values in this interval not displayed.    Cardiac Enzymes: No results for input(s): CKTOTAL, CKMB, CKMBINDEX, TROPONINI in the last 168 hours.  BNP: Invalid input(s): POCBNP  CBG: Recent Labs  Lab  04/21/19 0807 04/21/19 1147 04/21/19 1535 04/21/19 2001 04/22/19 0757  GLUCAP 86 76 77 90 80    Microbiology: Results for orders placed or performed during the hospital encounter of 03/28/2019  SARS CORONAVIRUS 2 (TAT 6-24 HRS) Nasopharyngeal Nasopharyngeal Swab     Status: None   Collection Time: 04/17/19 12:27 AM   Specimen: Nasopharyngeal Swab  Result Value Ref Range Status   SARS Coronavirus 2 NEGATIVE NEGATIVE Final    Comment: (NOTE) SARS-CoV-2 target nucleic acids are NOT DETECTED. The SARS-CoV-2 RNA is generally detectable in upper and lower respiratory specimens during the acute phase of infection. Negative results do not preclude SARS-CoV-2 infection, do not rule out co-infections with other pathogens, and should not be used as the sole basis for treatment or other patient management decisions. Negative results must be combined with clinical observations, patient history, and epidemiological information. The expected result is Negative. Fact Sheet for Patients: SugarRoll.be Fact Sheet for Healthcare Providers: https://www.woods-mathews.com/ This test is not yet approved or cleared by the [place] FDA and  has been authorized for detection and/or diagnosis of SARS-CoV-2 by FDA under an Emergency Use Authorization (EUA). This EUA will remain  in effect (meaning this test can be used) for the duration of the COVID-19 declaration under Section 56 4(b)(1) of the Act, 21 U.S.C. section 360bbb-3(b)(1), unless the authorization is terminated or revoked sooner. Performed at [facility], [place] [address], [place], [place] [address]   Culture, blood (routine x 2)     Status: None (Preliminary result)   Collection Time: 04/18/19  8:29 AM   Specimen: BLOOD RIGHT HAND  Result Value Ref Range Status   Specimen Description BLOOD RIGHT HAND  Final   Special Requests   Final    AEROBIC BOTTLE ONLY  Blood Culture results may not be  optimal due to an inadequate volume of blood received in culture bottles   Culture   Final    NO GROWTH 4 DAYS Performed at [facility] [address] [place], [place] [address]    Report Status PENDING  Incomplete  Culture, blood (routine x 2)     Status: None (Preliminary result)   Collection Time: 04/18/19  5:41 PM   Specimen: BLOOD RIGHT HAND  Result Value Ref Range Status   Specimen Description BLOOD RIGHT HAND  Final   Special Requests   Final    AEROBIC BOTTLE ONLY Blood Culture results may not be optimal due to an inadequate volume of blood received in culture bottles   Culture   Final    NO GROWTH 4 DAYS Performed at [facility], [place] [address] [place], [place] [address]    Report Status PENDING  Incomplete  MRSA PCR Screening     Status: Abnormal   Collection Time: 03/29/2019  9:26 AM   Specimen: Nasopharyngeal  Result Value Ref Range Status   MRSA by PCR POSITIVE (A) NEGATIVE Final    Comment:        The GeneXpert MRSA Assay (FDA approved for NASAL specimens only), is one component of a comprehensive MRSA colonization surveillance program. It is not intended to diagnose MRSA infection nor to guide or monitor treatment for MRSA infections. RESULT CALLED TO, READ BACK BY AND VERIFIED WITH: Jefferson Fuel RN 12:05 03/28/2019 ([email or web address]) Performed at [facility], [place] [address], [place], [place] [address]     Coagulation Studies: No results for input(s): LABPROT, INR in the last 72 hours.  Urinalysis: No results for input(s): COLORURINE, LABSPEC, PHURINE, GLUCOSEU, HGBUR, BILIRUBINUR, KETONESUR, PROTEINUR, UROBILINOGEN, NITRITE, LEUKOCYTESUR in the last 72 hours.  Invalid input(s): APPERANCEUR    Imaging: No results found.   Medications:   .  prismasol BGK 4/2.5 500 mL/hr at 04/21/19 2151  .  prismasol BGK 4/2.5 500 mL/hr at 04/21/19 2151  . sodium chloride    . heparin 1,200 Units/hr (04/22/19 0800)  . norepinephrine (LEVOPHED) Adult  infusion Stopped (04/21/19 0902)  . prismasol BGK 4/2.5 2,000 mL/hr at 04/22/19 0610  .  sodium bicarbonate (isotonic) infusion in sterile water Stopped (04/21/19 1226)  . vancomycin Stopped (04/21/19 1803)   . allopurinol  100 mg Oral BID  . aspirin EC  81 mg Oral Daily  . calcitRIOL  0.5 mcg Oral Q M,W,F-HD  . Chlorhexidine Gluconate Cloth  6 each Topical Daily  . darbepoetin (ARANESP) injection - DIALYSIS  60 mcg Intravenous Q Tue-HD  . feeding supplement (NEPRO CARB STEADY)  237 mL Oral TID BM  . feeding supplement (PRO-STAT SUGAR FREE 64)  30 mL Oral TID  . insulin aspart  0-9 Units Subcutaneous TID WC  . multivitamin  1 tablet Oral QHS  . mupirocin ointment   Nasal BID  . rosuvastatin  40 mg Oral QHS  . sevelamer carbonate  800 mg Oral TID WC   acetaminophen **OR** acetaminophen, alteplase, fentaNYL (SUBLIMAZE) injection, haloperidol lactate, heparin, ondansetron **OR** ondansetron (ZOFRAN) IV, sodium chloride, traMADol  Assessment/ Plan:  1. MRSA bacteremia- persistent without obvious source  - ID consulted and TEE negative for vegetations. She did have a prior MRSA hand wound 07/2018.  Unclear etiology continues on vancomycin 4 weeks therapy recommended by infectious disease appreciate assistance 2. ESRD- MWF -[place] -hypotension and shock initiated CRRT 04/20/2019 until 04/22/2019.  Patient seen on dialysis 04/23/2019 3. Hypertension/volume-CRRT has been discontinued 04/22/2019 intermittent hemodialysis began 04/23/2019 4. Anemia- hgb 10.5>9.9 Started Aranesp 60 Tuesday  5. Metabolic bone disease- Continue calcitriol with HD.    Renvela added as binder  6. Nutrition - alb low - renal carb mod diet - add protein suppl 7. Transient confusion and agitation during prior acute admission prior to transfer to [facility] 8. DM/gout/GERD -meds per primary 9. Rheumatoid arthritis -stable not issue at this time 10.Recently diagnosed RLE DVT 04/11/19 - heparin  IV for now 11.Elevated T. Bili - present at transfer     LOS: [place] @TODAY @10 :05 AM

## 2019-04-22 NOTE — Progress Notes (Signed)
Updated pt's sister on the plan of care and current status, answered all questions.

## 2019-04-22 NOTE — Progress Notes (Signed)
ANTICOAGULATION CONSULT NOTE - Follow Up Consult  Pharmacy Consult for Heparin Indication: DVT   Patient Measurements: Height: 5\' 2"  (157.5 cm) Weight: 253 lb 12 oz (115.1 kg) IBW/kg (Calculated) : 50.1 Heparin Dosing Weight: 77.3 kg  Vital Signs: BP: 152/77 (10/29 1000) Pulse Rate: 71 (10/29 1000)  Labs: Recent Labs    04/20/19 0639  04/21/19 0528 04/21/19 1604 04/21/19 2309 04/22/19 0133 04/22/19 0341 04/22/19 0910  HGB 10.1*  --  9.7*  --   --  10.5* 10.0*  --   HCT 33.2*  --  30.7*  --   --  31.0* 32.0*  --   PLT 257  --  190  --   --   --  223  --   APTT 146*  --  >200*  --  >200*  --   --  191*  HEPARINUNFRC 0.93*  --  1.08*  --   --   --   --  0.69  CREATININE 8.83*   < > 4.78* 3.25*  --   --  2.33*  --    < > = values in this interval not displayed.     Assessment: Heparin drip for RLE DVT, was on Xarelto prior to admission,  LD 10/23.   It seems that the aPTT and HL were correlating on 10/27 (aPTT 146; HL 0.93), so aPTT should have stopped being checked and relied on HL which is more accurate.  This am aPTT and hep lvl are not correlating aPTT 191 and HL 0.69. Patient is on a low dose of heparin for her weight and the xarelto has cleared out of her body  Some minor bleeding from HD cath site, but nothing to be concerned about per RN  Goal of Therapy:  Heparin level 0.3-0.7 units/ml Monitor platelets by anticoagulation protocol: Yes   Plan:  Continue heparin 1200 units/hr Re-check HL only at 1600 to confirm within range Monitor bleeding  Barth Kirks, PharmD, BCPS, BCCCP Clinical Pharmacist [phone]  Please check AMION for all [place] numbers  04/22/2019 10:45 AM

## 2019-04-22 NOTE — Progress Notes (Signed)
Physical Therapy Treatment Patient Details Name: Carla Compton MRN: [id number] DOB: Nov 17, 1980 Today's Date: 04/22/2019    History of Present Illness This 38 y.o. female transferred from [facility] with septic shock due to MRSA bactermia.  PMH includes:  ESRD on HD since 02/2019; DVT Rt LE, HTN, CHF, DM    PT Comments    On entry pt calling out for someone, however could be redirected and agreed to get to EoB, while preparing pt went back to calling out. Pt given max verbal and tactile cues for moving LE to EoB, pt with minimal effort. Pt requiring total A to sit EoB, once in upright pt burped once and had reflex of black tarry liquid. RN called and pt required max A to maintain seated balance. Once RN in room pt request return to bed requiring total A for trunk and LE management. D/c plans remain appropriate. PT will continue to follow acutely.     Follow Up Recommendations  SNF     Equipment Recommendations  None recommended by PT       Precautions / Restrictions Precautions Precautions: Fall Restrictions Weight Bearing Restrictions: No    Mobility  Bed Mobility Overal bed mobility: Needs Assistance Bed Mobility: Supine to Sit;Sit to Supine     Supine to sit: Total assist;+2 for physical assistance;+2 for safety/equipment Sit to supine: +2 for physical assistance;+2 for safety/equipment;Total assist   General bed mobility comments: Pt requires total A for moving to seated EoB, in seated pt requires max-mod assist for sitting balance, total Ax2 for return to bed  Transfers                 General transfer comment: unable to safely attempt         Balance Overall balance assessment: Needs assistance Sitting-balance support: Single extremity supported Sitting balance-Leahy Scale: Poor Sitting balance - Comments: Pt requirs max-mod A to maintain EOB sitting Pt with Lt lateral and posterior lean                                      Cognition Arousal/Alertness: Lethargic Behavior During Therapy: Flat affect Overall Cognitive Status: Impaired/Different from baseline Area of Impairment: Orientation;Attention;Memory;Following commands;Safety/judgement;Awareness;Problem solving                 Orientation Level: Disoriented to;Time Current Attention Level: Focused   Following Commands: Follows one step commands inconsistently Safety/Judgement: Decreased awareness of deficits;Decreased awareness of safety     General Comments: Pt lethargic and difficulty to engage in activity or conversation.  Perseveration noted.  She follows command inconsistently          General Comments General comments (skin integrity, edema, etc.): once in seated pt with large burp and then reflux of black tarry liquid, RN notified, VSS      Pertinent Vitals/Pain Pain Assessment: Faces Faces Pain Scale: Hurts whole lot Pain Location: back and Rt LE  Pain Descriptors / Indicators: Grimacing;Guarding;Moaning Pain Intervention(s): Limited activity within patient's tolerance;Monitored during session;Repositioned           PT Goals (current goals can now be found in the care plan section) Acute Rehab PT Goals Patient Stated Goal: to lie down  PT Goal Formulation: With patient Time For Goal Achievement: 05/04/19 Potential to Achieve Goals: Fair    Frequency    Min 2X/week      PT Plan Current plan remains appropriate  AM-PAC PT "6 Clicks" Mobility   Outcome Measure  Help needed turning from your back to your side while in a flat bed without using bedrails?: Total Help needed moving from lying on your back to sitting on the side of a flat bed without using bedrails?: Total Help needed moving to and from a bed to a chair (including a wheelchair)?: Total Help needed standing up from a chair using your arms (e.g., wheelchair or bedside chair)?: Total Help needed to walk in hospital room?: Total Help needed  climbing 3-5 steps with a railing? : Total 6 Click Score: 6    End of Session   Activity Tolerance: Patient limited by pain Patient left: in bed;with call bell/phone within reach;with bed alarm set Nurse Communication: Mobility status PT Visit Diagnosis: Muscle weakness (generalized) (M62.81);Difficulty in walking, not elsewhere classified (R26.2);Pain Pain - part of body: (back)     Time: [id number] PT Time Calculation (min) (ACUTE ONLY): 12 min  Charges:  $Therapeutic Activity: 8-22 mins                     Carla Compton B. Migdalia Dk PT, DPT Acute Rehabilitation Services Pager [phone] Office [phone]    [place] 04/22/2019, 5:38 PM

## 2019-04-22 NOTE — Progress Notes (Signed)
Carla Compton for Infectious Disease  Date of Admission:  04/17/2019      Total days of antibiotics 11 Vancomycin            ASSESSMENT: Carla Compton is a 38 y.o. female with known MRSA bacteremia due to unexplained source (TEE negative, CT scan of T-L spine and RLE unrevealing). Required transfer to ICU for CVVHD d/t hyperkalemia, signs of fluid overload, acid base imbalances. Antibiotics were broadened to include cefepime + metronidazole in addition to vancomycin with concern for worsening sepsis related to alternative pathogen/process.   Her affect/mental status is concerning. I called to discuss with her sister if there has ever been a h/o an event similar to this before or psych / drug use history. She reports none of the above; she states that it was very alarming to hear that she was combative and paranoid at [facility].  ?PRES vs alternative process. I would expect that if this were to have been related to her infection she would have been showing signs of improvement by now 11 days into treatment. Her sister did remark that she did have shoulder/arm pain prior to admission but she could not recall which --> says that Carla Compton has had more trouble with pain related to her arthritis lately. Seroquel started today with PRN haldol.   Source of infection remains unclear. Likely plan for 4 weeks for complicated bacteremia given repeated positive cultures at 24h.    PLAN: 1. Continue Vancomycin x 4 weeks (Nov 23) 2. ?MRI of brain to work up AMS/encephalopathy  3. Please place gauze dressing to right neck furuncle to collect drainage from previous CVC site.    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 allopurinol  100 mg Oral BID  . aspirin EC   81 mg Oral Daily  . calcitRIOL  0.5 mcg Oral Q M,W,F-HD  . Chlorhexidine Gluconate Cloth  6 each Topical Daily  . Chlorhexidine Gluconate Cloth  6 each Topical Q0600  . darbepoetin (ARANESP) injection - DIALYSIS  60 mcg Intravenous Q Tue-HD  . feeding supplement (NEPRO CARB STEADY)  237 mL Oral TID BM  . feeding supplement (PRO-STAT SUGAR FREE 64)  30 mL Oral TID  . gabapentin  300 mg Oral TID  . insulin aspart  0-9 Units Subcutaneous TID WC  . multivitamin  1 tablet Oral QHS  . mupirocin ointment   Nasal BID  . QUEtiapine  50 mg Oral BID  . rosuvastatin  40 mg Oral QHS  . sevelamer carbonate  800 mg Oral TID WC    SUBJECTIVE: Non-verbal. Grunts infrequently.    Interval Events:  Afebrile. WBC stable overall. Off CRRT and back on iHD.    Review of Systems: Review of Systems  Unable to perform ROS: Mental status change    Allergies  Allergen Reactions  . Contrast Media [Iodinated Diagnostic Agents] Other (See Comments)    Affected her kidneys  . Pepto-Bismol [Bismuth] Other (See Comments)    Unknown reaction    OBJECTIVE: Vitals:   04/22/19 1100 04/22/19 1200 04/22/19 1300 04/22/19 1400  BP: (!) 148/84 129/85 121/71 (!) 89/61  Pulse: 72 72 72 71  Resp: (!) 0 20 (!) 21 12  Temp:  Marland Kitchen)  96.8 F (36 C)    TempSrc:  Axillary    SpO2: 98% 97% 99% 99%  Weight:      Height:       Body mass index is 46.41 kg/m.  Physical Exam Vitals signs and nursing note reviewed.  Constitutional:      Comments: Resting in bed without distress.   HENT:     Mouth/Throat:     Mouth: Mucous membranes are dry.     Pharynx: Oropharynx is clear.  Eyes:     General: No scleral icterus.    Pupils: Pupils are equal, round, and reactive to light.  Neck:     Musculoskeletal: Neck supple.     Comments: Small furuncle at site where her previous CVC was in place.  Cardiovascular:     Rate and Rhythm: Normal rate and regular rhythm.     Heart sounds: No murmur.  Pulmonary:     Effort:  Pulmonary effort is normal.     Breath sounds: No wheezing or rhonchi.     Comments: RA Abdominal:     General: There is no distension.     Tenderness: There is no abdominal tenderness.  Skin:    General: Skin is warm and dry.     Capillary Refill: Capillary refill takes less than 2 seconds.     Findings: No erythema or rash.  Neurological:     Comments: Grunts with answers.      Lab Results Lab Results  Component Value Date   WBC 10.6 (H) 04/22/2019   HGB 10.0 (L) 04/22/2019   HCT 32.0 (L) 04/22/2019   MCV 96.1 04/22/2019   PLT 223 04/22/2019    Lab Results  Component Value Date   CREATININE 2.33 (H) 04/22/2019   BUN 12 04/22/2019   NA 136 04/22/2019   K 4.3 04/22/2019   CL 101 04/22/2019   CO2 24 04/22/2019    Lab Results  Component Value Date   ALT 16 04/20/2019   AST 46 (H) 04/20/2019   ALKPHOS 393 (H) 04/20/2019   BILITOT 3.7 (H) 04/20/2019     Microbiology: Recent Results (from the past 240 hour(s))  SARS CORONAVIRUS 2 (TAT 6-24 HRS) Nasopharyngeal Nasopharyngeal Swab     Status: None   Collection Time: 04/17/19 12:27 AM   Specimen: Nasopharyngeal Swab  Result Value Ref Range Status   SARS Coronavirus 2 NEGATIVE NEGATIVE Final    Comment: (NOTE) SARS-CoV-2 target nucleic acids are NOT DETECTED. The SARS-CoV-2 RNA is generally detectable in upper and lower respiratory specimens during the acute phase of infection. Negative results do not preclude SARS-CoV-2 infection, do not rule out co-infections with other pathogens, and should not be used as the sole basis for treatment or other patient management decisions. Negative results must be combined with clinical observations, patient history, and epidemiological information. The expected result is Negative. Fact Sheet for Patients: SugarRoll.be Fact Sheet for Healthcare Providers: https://www.woods-mathews.com/ This test is not yet approved or cleared by the [place] FDA and  has been authorized for detection and/or diagnosis of SARS-CoV-2 by FDA under an Emergency Use Authorization (EUA). This EUA will remain  in effect (meaning this test can be used) for the duration of the COVID-19 declaration under Section 56 4(b)(1) of the Act, 21 U.S.C. section 360bbb-3(b)(1), unless the authorization is terminated or revoked sooner. Performed at [facility], [place] [address], [place], [place] [address]   Culture, blood (routine x 2)     Status: None (  Preliminary result)   Collection Time: 04/18/19  8:29 AM   Specimen: BLOOD RIGHT HAND  Result Value Ref Range Status   Specimen Description BLOOD RIGHT HAND  Final   Special Requests   Final    AEROBIC BOTTLE ONLY Blood Culture results may not be optimal due to an inadequate volume of blood received in culture bottles   Culture   Final    NO GROWTH 4 DAYS Performed at [facility], [place] [address], [place], [place] [address]    Report Status PENDING  Incomplete  Culture, blood (routine x 2)     Status: None (Preliminary result)   Collection Time: 04/18/19  5:41 PM   Specimen: BLOOD RIGHT HAND  Result Value Ref Range Status   Specimen Description BLOOD RIGHT HAND  Final   Special Requests   Final    AEROBIC BOTTLE ONLY Blood Culture results may not be optimal due to an inadequate volume of blood received in culture bottles   Culture   Final    NO GROWTH 4 DAYS Performed at [facility], [place] [address] [place], [place] [address]    Report Status PENDING  Incomplete  MRSA PCR Screening     Status: Abnormal   Collection Time: 04/15/2019  9:26 AM   Specimen: Nasopharyngeal  Result Value Ref Range Status   MRSA by PCR POSITIVE (A) NEGATIVE Final    Comment:        The GeneXpert MRSA Assay (FDA approved for NASAL specimens only), is one component of a comprehensive MRSA colonization surveillance program. It is not intended to diagnose MRSA infection nor to guide or monitor  treatment for MRSA infections. RESULT CALLED TO, READ BACK BY AND VERIFIED WITH: Jefferson Fuel RN 12:05 03/31/2019 ([email or web address]) Performed at [facility], [place] [address], [place],  [address]     Janene Madeira, MSN, NP-C [facility] for Infectious Disease [facility] [place] [facility] Cell: [phone] Pager: [phone]  @TODAY @ 4:37 PM

## 2019-04-22 NOTE — Progress Notes (Signed)
ANTICOAGULATION CONSULT NOTE - Follow Up Consult  Pharmacy Consult for Heparin Indication: DVT   Patient Measurements: Height: 5\' 2"  (157.5 cm) Weight: 253 lb 12 oz (115.1 kg) IBW/kg (Calculated) : 50.1 Heparin Dosing Weight: 77.3 kg  Vital Signs: Temp: 96.8 F (36 C) (10/29 1200) Temp Source: Axillary (10/29 1200) BP: 89/61 (10/29 1400) Pulse Rate: 79 (10/29 1600)  Labs: Recent Labs    04/20/19 [id number]  04/21/19 0528 04/21/19 1604 04/21/19 2309 04/22/19 0133 04/22/19 0341 04/22/19 0910 04/22/19 1601  HGB 10.1*  --  9.7*  --   --  10.5* 10.0*  --   --   HCT 33.2*  --  30.7*  --   --  31.0* 32.0*  --   --   PLT 257  --  190  --   --   --  223  --   --   APTT 146*  --  >200*  --  >200*  --   --  191*  --   HEPARINUNFRC 0.93*  --  1.08*  --   --   --   --  0.69 0.59  CREATININE 8.83*   < > 4.78* 3.25*  --   --  2.33*  --  2.82*   < > = values in this interval not displayed.     Assessment: Heparin drip for RLE DVT, was on Xarelto prior to admission,  LD 10/23.   It seems that the aPTT and HL were correlating on 10/27 (aPTT 146; HL 0.93), so aPTT should have stopped being checked and relied on HL which is more accurate.  This am aPTT and hep lvl are not correlating aPTT 191 and HL 0.69. Patient is on a low dose of heparin for her weight and the xarelto has cleared out of her body  Some minor bleeding from HD cath site, but nothing to be concerned about per RN  Goal of Therapy:  Heparin level 0.3-0.7 units/ml Monitor platelets by anticoagulation protocol: Yes   Plan:  - Heparin level remains therapeutic  - Continue heparin 1200 units/hr - Will continue forward with daily Heparin levels at this time  - Continue to Monitor bleeding  Duanne Limerick, PharmD, BCPS Clinical Pharmacist   Please check AMION for all [place] numbers  04/22/2019 4:53 PM

## 2019-04-22 NOTE — Progress Notes (Signed)
NAME:  Carla Compton, MRN:  [id number], DOB:  05-May-1981, LOS: 6 ADMISSION DATE:  04/22/2019, CONSULTATION DATE:  04/22/19 CHIEF COMPLAINT:  Hypotension, sepsis   Brief History   38 y.o. F with ESRD and DVT who was transferred from 10/24 from [place], [place] for MRSA bacteremia and intermittent confusion of unclear source.  She has had two negative CT head's, Echo and TEE without vegetation and non-specific findings on CXR.   She has been on Vancomycin since admission.   Over the last several hours, BP has been down-trending to SBP 70-80's with increased confusion. On dialysis MWF and per nursing did not get dialysis today secondary to hypotension .  Given 1L NS without improvement therefore PCCM consulted.  History of present illness     Carla Compton is a 38 y.o. F with PMH of recently diagnosed RLE DVT on Xarelto, ESRD, Type 2 DM, Rheumatoid arthritis, HTN and HL who was initially admitted in [place] 10/19 for septic shock.  No clear source was identified, 4/4 BC grew MRSA and she was transferred to [facility] for ID consult and further management.  Per notes,  She was initially on Levophed and Vancomycin; pressors were weaned and abx changed from Vancomycin to Zyvox and meropenem.  She had initial episodes of confusion and had a negative CT head and spine per notes, RLE US showed some fluid collection concerning for cellulitis, no abscess.  Since admission on 10/24 she has been treated with Vancomycin, TEE negative for vegetations.  Repeat BC are pending and no fevers since admission, WBC up-trending from 9.5->13.0.  Overnight, Pt's SBP has been down-trending from baseline 90-100 to as low as 70/43.  RN notes increasing confusion.   On exam, pt is sleeping but arousable to voice.  She appears obtunded and will nod head and give one-word answers.    Past Medical History   Past Medical History:  Diagnosis Date  . Anemia    2019  . Benign essential HTN   . CHF (congestive heart failure) ([place])   .  CKD (chronic kidney disease) stage 3, GFR 30-59 ml/min   . Diabetes ([place])    Rheumatoid Arthritis   Significant Hospital Events   10/24>>Admitted from OSH in [place] 10/27>>Txfr to [facility]  10/27 >> CRRT initiated 10/29>> CRRT discontinued.   Consults:  Nephrology ID PCCM  Procedures:  10/27-insertion of temporary hemodialysis catheter right femoral vein  Significant Diagnostic Tests:  10/24 CT head>>No acute findings  10/24 CXR>>Cardiomegaly with mild vascular congestion. Pneumonia is not Excluded. 10/27 CT RLE>> possible right foot osteomyelitis MRI head>>pending  Micro Data:  10/26 BC x2>>negative. 10/26 MRSA screen>>positive 10/24 Sars-CoV-2>>neg  Antimicrobials:  Vancomycin 10/24-  Interim history/subjective:  Off CRRT, for IHD tomorrow. Continues to be agitated with loud vocalizations. Responds to redirection. Limited improvement with analgesics.  Objective   Blood pressure (!) 152/77, pulse 71, temperature 98.3 F (36.8 C), temperature source Oral, resp. rate 17, height 5\' 2"  (1.575 m), weight 115.1 kg, last menstrual period 04/11/2019, SpO2 99 %. on 3L [place]        Intake/Output Summary (Last 24 hours) at 04/22/2019 1050 Last data filed at 04/22/2019 1000 Gross per 24 hour  Intake 629.04 ml  Output 2488 ml  Net -1858.96 ml   Filed Weights   04/20/19 0107 04/21/19 0500 04/22/19 0339  Weight: 116 kg 119.3 kg 115.1 kg    General:  Overweight, agitated and screaming out HEENT: MM pink/moist Neuro: awake and follows basic commands, screaming out in full  but repetitive sentences. Moves all limbs with full strength. CV: s1s2 RRR, no m/r/g PULM:  No retractions or accessory muscle use, diminished in the bilateral bases GI: soft, bsx4 active, no TTP Extremities: warm/dry, 1+ edema  Skin: no rashes or lesions no obvious foot pain.   Resolved Hospital Problem list     Assessment & Plan:   Was critically ill due to septic Shock secondary to MRSA bacteremia Now  off vasopressors Continuing vancomycin -For MRI of head and foot to complete work-up once stable. -ID to de-escalate antibiotic therapy as appropriate  Encephalopathy with no focal deficits. Not worsening. Negative EEG.  Consistent with delirium -Start Seroquel and continue haloperidol prn  Critically ill due to stage V kidney disease requiring continuous hemodialysis Adequate dialysis, uremia does not explain mental status. - Transition to IHD. - If tolerates, will pull temporary line.  Seropositive, erosive rheumatoid arthritis -Follows with [facility], last note in 03/2018 was on biologic therapy, however nephrology note from two months ago notes pt takes Plaquenil -does not appear pt has been on any recent steroids to suggest the need for stress-dose steroids inpatient -will need outpatient f/u  Best practice:  Diet: NPO Pain/Anxiety/Delirium protocol (if indicated): n/a VAP protocol (if indicated): n/a DVT prophylaxis: heparin GI prophylaxis: n/a Glucose control: Lantus and SSI Mobility: bed rest Code Status: Full Family Communication: Have updated mother 10/28 Disposition: ICU  Labs   CBC: Recent Labs  Lab 04/17/19 0320  03/31/2019 0249 04/12/2019 1901 04/20/19 0259 04/20/19 [id number] 04/21/19 0528 04/22/19 0133 04/22/19 0341  WBC 9.5   < > 12.8* 13.0*  --  15.3* 9.7  --  10.6*  NEUTROABS 7.2  --   --   --   --   --   --   --   --   HGB 10.5*   < > 10.2* 9.9* 11.2* 10.1* 9.7* 10.5* 10.0*  HCT 33.1*   < > 32.4* 31.3* 33.0* 33.2* 30.7* 31.0* 32.0*  MCV 94.6   < > 94.2 94.8  --  97.9 94.8  --  96.1  PLT 232   < > 230 221  --  257 190  --  223   < > = values in this interval not displayed.    Basic Metabolic Panel: Recent Labs  Lab 04/17/19 0320  04/02/2019 1901  04/20/19 [id number] 04/20/19 1532 04/21/19 0528 04/21/19 1604 04/22/19 0133 04/22/19 0341  NA 134*   < > 131*   < > 131* 130* 135 135 146* 136  K 3.7   < > 5.0   < > 5.3* 6.7* 4.7 4.4 3.5  4.3  CL 94*   < > 92*  --  96* 98 101 99  --  101  CO2 27   < > 20*  --  18* 13* 21* 23  --  24  GLUCOSE 92   < > 88  --  147* 113* 100* 86  --  79  BUN 38*   < > 68*  --  74* 74* 38* 23*  --  12  CREATININE 5.45*   < > 8.54*  --  8.83* 8.71* 4.78* 3.25*  --  2.33*  CALCIUM 8.7*   < > 7.6*  --  7.2* 6.7* 7.2* 7.6*  --  7.8*  MG 2.0  --   --   --   --   --  2.3  --   --  2.5*  PHOS 5.4*  --  8.8*  --   --  9.4* 4.7* 3.4  --  2.4*   < > = values in this interval not displayed.   GFR: Estimated Creatinine Clearance: 40.1 mL/min (A) (by C-G formula based on SCr of 2.33 mg/dL (H)). Recent Labs  Lab 04/20/2019 1901 04/13/2019 2327 04/20/19 0255 04/20/19 0639 04/21/19 0528 04/22/19 0341  WBC 13.0*  --   --  15.3* 9.7 10.6*  LATICACIDVEN  --  0.7 0.9  --   --   --     Liver Function Tests: Recent Labs  Lab 04/17/19 0320 03/26/2019 0249  04/20/19 0639 04/20/19 1532 04/21/19 0528 04/21/19 1604 04/22/19 0341  AST 16 25  --  46*  --   --   --   --   ALT 15 14  --  16  --   --   --   --   ALKPHOS 145* 254*  --  393*  --   --   --   --   BILITOT 3.7* 3.7*  --  3.7*  --   --   --   --   PROT 6.3* 6.3*  --  6.0*  --   --   --   --   ALBUMIN 1.7* 1.7*   < > 1.7* 1.5* 1.6* 1.7* 1.8*   < > = values in this interval not displayed.   No results for input(s): LIPASE, AMYLASE in the last 168 hours. Recent Labs  Lab 04/20/19 0255  AMMONIA 23    ABG    Component Value Date/Time   PHART 7.348 (L) 04/22/2019 0133   PCO2ART 34.7 04/22/2019 0133   PO2ART 75.0 (L) 04/22/2019 0133   HCO3 19.9 (L) 04/22/2019 0133   TCO2 21 (L) 04/22/2019 0133   ACIDBASEDEF 6.0 (H) 04/22/2019 0133   O2SAT 96.0 04/22/2019 0133     Coagulation Profile: No results for input(s): INR, PROTIME in the last 168 hours.  Cardiac Enzymes: No results for input(s): CKTOTAL, CKMB, CKMBINDEX, TROPONINI in the last 168 hours.  HbA1C: Hgb A1c MFr Bld  Date/Time Value Ref Range Status  04/17/2019 03:20 AM 6.9 (H) 4.8 -  5.6 % Final    Comment:    (NOTE) Pre diabetes:          5.7%-6.4% Diabetes:              >6.4% Glycemic control for   <7.0% adults with diabetes   05/26/2018 04:57 AM 7.2 (H) 4.8 - 5.6 % Final    Comment:    (NOTE) Pre diabetes:          5.7%-6.4% Diabetes:              >6.4% Glycemic control for   <7.0% adults with diabetes     CBG: Recent Labs  Lab 04/21/19 0807 04/21/19 1147 04/21/19 1535 04/21/19 2001 04/22/19 [place], MD [facility] ICU Physician [place]  Pager: [phone] Mobile: [phone] After hours: [phone].

## 2019-04-22 NOTE — Progress Notes (Signed)
Pharmacy Antibiotic Note  Carla Compton is a 38 y.o. female admitted on 03/25/2019 with hx of MRSA bacteremia. Hx of HD MWF, was on CRRT which was stopped this am.  Next HD is planned Fri (tomorrow)  Plan: Adjust vanc to 1 g w HD MWF F/U HD schedule and lvls prn  Height: 5\' 2"  (157.5 cm) Weight: 253 lb 12 oz (115.1 kg) IBW/kg (Calculated) : 50.1  Temp (24hrs), Avg:94.6 F (34.8 C), Min:92.4 F (33.6 C), Max:98.3 F (36.8 C)  Recent Labs  Lab 04/17/19 0320  04/24/2019 0249 04/22/2019 1901 03/26/2019 2327 04/20/19 0255 04/20/19 0639 04/20/19 1532 04/21/19 0528 04/21/19 1604 04/22/19 0341  WBC 9.5   < > 12.8* 13.0*  --   --  15.3*  --  9.7  --  10.6*  CREATININE 5.45*  --  7.82* 8.54*  --   --  8.83* 8.71* 4.78* 3.25* 2.33*  LATICACIDVEN  --   --   --   --  0.7 0.9  --   --   --   --   --   VANCORANDOM 10  --   --   --   --   --   --   --   --   --   --    < > = values in this interval not displayed.    Estimated Creatinine Clearance: 40.1 mL/min (A) (by C-G formula based on SCr of 2.33 mg/dL (H)).    Allergies  Allergen Reactions  . Contrast Media [Iodinated Diagnostic Agents] Other (See Comments)    Affected her kidneys  . Pepto-Bismol [Bismuth] Other (See Comments)    Unknown reaction   Barth Kirks, PharmD, BCPS, BCCCP Clinical Pharmacist [phone]  Please check AMION for all [place] numbers  04/22/2019 10:50 AM

## 2019-04-23 ENCOUNTER — Inpatient Hospital Stay (HOSPITAL_COMMUNITY): Payer: BLUE CROSS/BLUE SHIELD

## 2019-04-23 DIAGNOSIS — G9340 Encephalopathy, unspecified: Secondary | ICD-10-CM

## 2019-04-23 DIAGNOSIS — J9601 Acute respiratory failure with hypoxia: Secondary | ICD-10-CM | POA: Diagnosis not present

## 2019-04-23 DIAGNOSIS — R0902 Hypoxemia: Secondary | ICD-10-CM

## 2019-04-23 DIAGNOSIS — Z95828 Presence of other vascular implants and grafts: Secondary | ICD-10-CM

## 2019-04-23 LAB — COMPREHENSIVE METABOLIC PANEL
ALT: 34 U/L (ref 0–44)
AST: 99 U/L — ABNORMAL HIGH (ref 15–41)
Albumin: 1.7 g/dL — ABNORMAL LOW (ref 3.5–5.0)
Alkaline Phosphatase: 654 U/L — ABNORMAL HIGH (ref 38–126)
Anion gap: 11 (ref 5–15)
BUN: 14 mg/dL (ref 6–20)
CO2: 25 mmol/L (ref 22–32)
Calcium: 8.1 mg/dL — ABNORMAL LOW (ref 8.9–10.3)
Chloride: 101 mmol/L (ref 98–111)
Creatinine, Ser: 2.18 mg/dL — ABNORMAL HIGH (ref 0.44–1.00)
GFR calc Af Amer: 33 mL/min — ABNORMAL LOW (ref >60)
GFR calc non Af Amer: 28 mL/min — ABNORMAL LOW (ref >60)
Glucose, Bld: 193 mg/dL — ABNORMAL HIGH (ref 70–99)
Potassium: 3.5 mmol/L (ref 3.5–5.1)
Sodium: 137 mmol/L (ref 135–145)
Total Bilirubin: 3 mg/dL — ABNORMAL HIGH (ref 0.3–1.2)
Total Protein: 6.3 g/dL — ABNORMAL LOW (ref 6.5–8.1)

## 2019-04-23 LAB — CBC
HCT: 28.1 % — ABNORMAL LOW (ref 36.0–46.0)
HCT: 28.1 % — ABNORMAL LOW (ref 36.0–46.0)
Hemoglobin: 8.6 g/dL — ABNORMAL LOW (ref 12.0–15.0)
Hemoglobin: 8.6 g/dL — ABNORMAL LOW (ref 12.0–15.0)
MCH: 29.5 pg (ref 26.0–34.0)
MCH: 30 pg (ref 26.0–34.0)
MCHC: 30.6 g/dL (ref 30.0–36.0)
MCHC: 30.6 g/dL (ref 30.0–36.0)
MCV: 96.2 fL (ref 80.0–100.0)
MCV: 97.9 fL (ref 80.0–100.0)
Platelets: 193 10*3/uL (ref 150–400)
Platelets: 206 10*3/uL (ref 150–400)
RBC: 2.92 MIL/uL — ABNORMAL LOW (ref 3.87–5.11)
RBC: 2.87 MIL/uL — ABNORMAL LOW (ref 3.87–5.11)
RDW: 16.6 % — ABNORMAL HIGH (ref 11.5–15.5)
RDW: 16.9 % — ABNORMAL HIGH (ref 11.5–15.5)
WBC: 9 10*3/uL (ref 4.0–10.5)
WBC: 14 10*3/uL — ABNORMAL HIGH (ref 4.0–10.5)
nRBC: 0 % (ref 0.0–0.2)
nRBC: 0 % (ref 0.0–0.2)

## 2019-04-23 LAB — CULTURE, BLOOD (ROUTINE X 2)
Culture: NO GROWTH
Culture: NO GROWTH

## 2019-04-23 LAB — POCT I-STAT 7, (LYTES, BLD GAS, ICA,H+H)
Bicarbonate: 28.2 mmol/L — ABNORMAL HIGH (ref 20.0–28.0)
Calcium, Ion: 1.19 mmol/L (ref 1.15–1.40)
HCT: 29 % — ABNORMAL LOW (ref 36.0–46.0)
Hemoglobin: 9.9 g/dL — ABNORMAL LOW (ref 12.0–15.0)
O2 Saturation: 100 %
Patient temperature: 98.5
Potassium: 3.6 mmol/L (ref 3.5–5.1)
Sodium: 137 mmol/L (ref 135–145)
TCO2: 30 mmol/L (ref 22–32)
pCO2 arterial: 61.5 mmHg — ABNORMAL HIGH (ref 32.0–48.0)
pH, Arterial: 7.269 — ABNORMAL LOW (ref 7.350–7.450)
pO2, Arterial: 347 mmHg — ABNORMAL HIGH (ref 83.0–108.0)

## 2019-04-23 LAB — MAGNESIUM
Magnesium: 2.7 mg/dL — ABNORMAL HIGH (ref 1.7–2.4)
Magnesium: 2.1 mg/dL (ref 1.7–2.4)

## 2019-04-23 LAB — RENAL FUNCTION PANEL
Albumin: 1.6 g/dL — ABNORMAL LOW (ref 3.5–5.0)
Anion gap: 15 (ref 5–15)
BUN: 29 mg/dL — ABNORMAL HIGH (ref 6–20)
CO2: 20 mmol/L — ABNORMAL LOW (ref 22–32)
Calcium: 8.5 mg/dL — ABNORMAL LOW (ref 8.9–10.3)
Chloride: 102 mmol/L (ref 98–111)
Creatinine, Ser: 3.29 mg/dL — ABNORMAL HIGH (ref 0.44–1.00)
GFR calc Af Amer: 20 mL/min — ABNORMAL LOW (ref >60)
GFR calc non Af Amer: 17 mL/min — ABNORMAL LOW (ref >60)
Glucose, Bld: 134 mg/dL — ABNORMAL HIGH (ref 70–99)
Phosphorus: 3 mg/dL (ref 2.5–4.6)
Potassium: 4.2 mmol/L (ref 3.5–5.1)
Sodium: 137 mmol/L (ref 135–145)

## 2019-04-23 LAB — HEPARIN LEVEL (UNFRACTIONATED): Heparin Unfractionated: 0.59 IU/mL (ref 0.30–0.70)

## 2019-04-23 LAB — HEPATITIS PANEL, ACUTE
HCV Ab: NONREACTIVE
Hep A IgM: NONREACTIVE
Hep B C IgM: NONREACTIVE
Hepatitis B Surface Ag: NONREACTIVE

## 2019-04-23 LAB — PHOSPHORUS: Phosphorus: 3.7 mg/dL (ref 2.5–4.6)

## 2019-04-23 LAB — HEPATITIS B CORE ANTIBODY, TOTAL: Hep B Core Total Ab: NONREACTIVE

## 2019-04-23 LAB — APTT: aPTT: 174 seconds (ref 24–36)

## 2019-04-23 LAB — GLUCOSE, CAPILLARY
Glucose-Capillary: 116 mg/dL — ABNORMAL HIGH (ref 70–99)
Glucose-Capillary: 116 mg/dL — ABNORMAL HIGH (ref 70–99)
Glucose-Capillary: 190 mg/dL — ABNORMAL HIGH (ref 70–99)
Glucose-Capillary: 190 mg/dL — ABNORMAL HIGH (ref 70–99)

## 2019-04-23 MED ORDER — INSULIN ASPART 100 UNIT/ML ~~LOC~~ SOLN
0.0000 [IU] | SUBCUTANEOUS | Status: DC
  Administered 2019-04-23 – 2019-04-24 (×4): 2 [IU] via SUBCUTANEOUS
  Administered 2019-04-25 – 2019-04-28 (×7): 1 [IU] via SUBCUTANEOUS
  Administered 2019-04-28 (×2): 2 [IU] via SUBCUTANEOUS
  Administered 2019-04-28: 15:00:00 1 [IU] via SUBCUTANEOUS
  Administered 2019-04-28 (×2): 2 [IU] via SUBCUTANEOUS
  Administered 2019-04-29: 3 [IU] via SUBCUTANEOUS
  Administered 2019-04-29 (×3): 2 [IU] via SUBCUTANEOUS
  Administered 2019-04-29 – 2019-04-30 (×2): 1 [IU] via SUBCUTANEOUS

## 2019-04-23 MED ORDER — HEPARIN SODIUM (PORCINE) 1000 UNIT/ML IJ SOLN
INTRAMUSCULAR | Status: AC
  Filled 2019-04-23: qty 4

## 2019-04-23 MED ORDER — LIDOCAINE-PRILOCAINE 2.5-2.5 % EX CREA: 1.0000 "application " | TOPICAL_CREAM | CUTANEOUS | Status: DC | PRN

## 2019-04-23 MED ORDER — ROCURONIUM BROMIDE 10 MG/ML (PF) SYRINGE
PREFILLED_SYRINGE | INTRAVENOUS | Status: AC
  Filled 2019-04-23: qty 10

## 2019-04-23 MED ORDER — NOREPINEPHRINE 4 MG/250ML-% IV SOLN
0.0000 ug/min | INTRAVENOUS | Status: DC
  Administered 2019-04-23 (×3): 20 ug/min via INTRAVENOUS
  Administered 2019-04-24: 12 ug/min via INTRAVENOUS
  Administered 2019-04-24: 16 ug/min via INTRAVENOUS
  Administered 2019-04-24: 9 ug/min via INTRAVENOUS
  Administered 2019-04-26: 5 ug/min via INTRAVENOUS
  Filled 2019-04-23 (×8): qty 250

## 2019-04-23 MED ORDER — CALCITRIOL 0.5 MCG PO CAPS
ORAL_CAPSULE | ORAL | Status: AC
  Filled 2019-04-23: qty 1

## 2019-04-23 MED ORDER — ALTEPLASE 2 MG IJ SOLR: 2.0000 mg | Freq: Once | INTRAMUSCULAR | Status: DC | PRN

## 2019-04-23 MED ORDER — ORAL CARE MOUTH RINSE
15.0000 mL | OROMUCOSAL | Status: DC
  Administered 2019-04-24 – 2019-05-01 (×65): 15 mL via OROMUCOSAL

## 2019-04-23 MED ORDER — NEPRO/CARBSTEADY PO LIQD
1000.0000 mL | ORAL | Status: DC
  Filled 2019-04-23 (×2): qty 1000

## 2019-04-23 MED ORDER — SODIUM CHLORIDE 0.9 % IV SOLN: 100.0000 mL | INTRAVENOUS | Status: DC | PRN

## 2019-04-23 MED ORDER — MIDAZOLAM HCL 2 MG/2ML IJ SOLN
INTRAMUSCULAR | Status: AC
  Filled 2019-04-23: qty 2

## 2019-04-23 MED ORDER — LIDOCAINE HCL (PF) 1 % IJ SOLN: 5.0000 mL | INTRAMUSCULAR | Status: DC | PRN

## 2019-04-23 MED ORDER — PENTAFLUOROPROP-TETRAFLUOROETH EX AERO: 1.0000 "application " | INHALATION_SPRAY | CUTANEOUS | Status: DC | PRN

## 2019-04-23 MED ORDER — FENTANYL CITRATE (PF) 100 MCG/2ML IJ SOLN
INTRAMUSCULAR | Status: AC
  Filled 2019-04-23: qty 2

## 2019-04-23 MED ORDER — PRO-STAT SUGAR FREE PO LIQD
30.0000 mL | Freq: Every day | ORAL | Status: DC
  Administered 2019-04-24 – 2019-04-26 (×3): 30 mL
  Filled 2019-04-23 (×3): qty 30

## 2019-04-23 MED ORDER — CHLORHEXIDINE GLUCONATE 0.12% ORAL RINSE (MEDLINE KIT)
15.0000 mL | Freq: Two times a day (BID) | OROMUCOSAL | Status: DC
  Administered 2019-04-23 – 2019-04-30 (×14): 15 mL via OROMUCOSAL

## 2019-04-23 MED ORDER — HEPARIN SODIUM (PORCINE) 1000 UNIT/ML DIALYSIS: 1000.0000 [IU] | INTRAMUSCULAR | Status: DC | PRN

## 2019-04-23 NOTE — Progress Notes (Signed)
NAME:  Carla Compton, MRN:  [id number], DOB:  24-Sep-1980, LOS: 7 ADMISSION DATE:  04/01/2019, CONSULTATION DATE:  04/23/19 CHIEF COMPLAINT:  Hypotension, sepsis   Brief History   38 y.o. F with ESRD and DVT who was transferred from 10/24 from [place], [place] for MRSA bacteremia and intermittent confusion of unclear source.  She has had two negative CT head's, Echo and TEE without vegetation and non-specific findings on CXR.   She has been on Vancomycin since admission.   Over the last several hours, BP has been down-trending to SBP 70-80's with increased confusion. On dialysis MWF and per nursing did not get dialysis today secondary to hypotension .  Given 1L NS without improvement therefore PCCM consulted.  History of present illness     Carla Compton is a 38 y.o. F with PMH of recently diagnosed RLE DVT on Xarelto, ESRD, Type 2 DM, Rheumatoid arthritis, HTN and HL who was initially admitted in [place] 10/19 for septic shock.  No clear source was identified, 4/4 BC grew MRSA and she was transferred to [facility] for ID consult and further management.  Per notes,  She was initially on Levophed and Vancomycin; pressors were weaned and abx changed from Vancomycin to Zyvox and meropenem.  She had initial episodes of confusion and had a negative CT head and spine per notes, RLE US showed some fluid collection concerning for cellulitis, no abscess.  Since admission on 10/24 she has been treated with Vancomycin, TEE negative for vegetations.  Repeat BC are pending and no fevers since admission, WBC up-trending from 9.5->13.0.  Overnight, Pt's SBP has been down-trending from baseline 90-100 to as low as 70/43.  RN notes increasing confusion.   On exam, pt is sleeping but arousable to voice.  She appears obtunded and will nod head and give one-word answers.    Past Medical History   Past Medical History:  Diagnosis Date  . Anemia    2019  . Benign essential HTN   . CHF (congestive heart failure) ([place])   .  CKD (chronic kidney disease) stage 3, GFR 30-59 ml/min   . Diabetes ([place])    Rheumatoid Arthritis   Significant Hospital Events   10/24>>Admitted from OSH in [place] 10/27>>Txfr to [facility]  10/27 >> CRRT initiated 10/29>> CRRT discontinued.   Consults:  Nephrology ID PCCM  Procedures:  10/27-insertion of temporary hemodialysis catheter right femoral vein  Significant Diagnostic Tests:  10/24 CT head>>No acute findings  10/24 CXR>>Cardiomegaly with mild vascular congestion. Pneumonia is not Excluded. 10/27 CT RLE>> possible right foot osteomyelitis MRI head>>pending  Micro Data:  10/26 BC x2>>negative. 10/26 MRSA screen>>positive 10/24 Sars-CoV-2>>neg  Antimicrobials:  Vancomycin 10/24-  Interim history/subjective:  Tolerating IHD today.  Less responsive.   Objective   Blood pressure (!) 96/47, pulse 74, temperature 99 F (37.2 C), temperature source Oral, resp. rate (!) 9, height 5\' 2"  (1.575 m), weight 116.8 kg, last menstrual period 04/21/2019, SpO2 100 %. on 3L [place]        Intake/Output Summary (Last 24 hours) at 04/23/2019 1012 Last data filed at 04/23/2019 1000 Gross per 24 hour  Intake 368.02 ml  Output -  Net 368.02 ml   Filed Weights   04/21/19 0500 04/22/19 0339 04/23/19 0705  Weight: 119.3 kg 115.1 kg 116.8 kg    General:  Overweight, somnolent HEENT: MM pink/moist Neuro:stuporous with limited response to pain. CV: s1s2 RRR, no m/r/g PULM:  No retractions or accessory muscle use, diminished in the bilateral bases GI:  soft, bsx4 active, no TTP Extremities: warm/dry, 1+ edema  Skin: no rashes or lesions no obvious foot pain. Small area of induration at site of prior central line, draining blood.   Resolved Hospital Problem list     Assessment & Plan:   Was critically ill due to septic Shock secondary to MRSA bacteremia Now off vasopressors Continuing vancomycin -For MRI of head and foot to complete work-up once stable. -ID to de-escalate  antibiotic therapy as appropriate  Encephalopathy with no focal deficits. Not worsening. Negative EEG.  Consistent with delirium -Hold seroquel as mental status has declined. - For MRI  Today.  Critically ill due to stage V kidney disease requiring continuous hemodialysis Adequate dialysis, uremia does not explain mental status. - Transition to IHD. - If tolerates, will pull temporary line.  Seropositive, erosive rheumatoid arthritis -Follows with [facility], last note in 03/2018 was on biologic therapy, however nephrology note from two months ago notes pt takes Plaquenil -does not appear pt has been on any recent steroids to suggest the need for stress-dose steroids inpatient -will need outpatient f/u  Best practice:  Diet: NPO Pain/Anxiety/Delirium protocol (if indicated): n/a VAP protocol (if indicated): n/a DVT prophylaxis: heparin GI prophylaxis: n/a Glucose control: Lantus and SSI Mobility: bed rest Code Status: Full Family Communication: Have updated mother 10/28 Disposition: ICU  Labs   CBC: Recent Labs  Lab 04/17/19 0320  04/11/2019 1901  04/20/19 0639 04/21/19 0528 04/22/19 0133 04/22/19 0341 04/23/19 0358  WBC 9.5   < > 13.0*  --  15.3* 9.7  --  10.6* 9.0  NEUTROABS 7.2  --   --   --   --   --   --   --   --   HGB 10.5*   < > 9.9*   < > 10.1* 9.7* 10.5* 10.0* 8.6*  HCT 33.1*   < > 31.3*   < > 33.2* 30.7* 31.0* 32.0* 28.1*  MCV 94.6   < > 94.8  --  97.9 94.8  --  96.1 96.2  PLT 232   < > 221  --  257 190  --  223 193   < > = values in this interval not displayed.    Basic Metabolic Panel: Recent Labs  Lab 04/17/19 0320  04/21/19 0528 04/21/19 1604 04/22/19 0133 04/22/19 0341 04/22/19 1601 04/23/19 0358  NA 134*   < > 135 135 146* 136 136 137  K 3.7   < > 4.7 4.4 3.5 4.3 4.4 4.2  CL 94*   < > 101 99  --  101 101 102  CO2 27   < > 21* 23  --  24 20* 20*  GLUCOSE 92   < > 100* 86  --  79 156* 134*  BUN 38*   < > 38* 23*  --  12  20 29*  CREATININE 5.45*   < > 4.78* 3.25*  --  2.33* 2.82* 3.29*  CALCIUM 8.7*   < > 7.2* 7.6*  --  7.8* 8.3* 8.5*  MG 2.0  --  2.3  --   --  2.5*  --  2.7*  PHOS 5.4*   < > 4.7* 3.4  --  2.4* 2.9 3.0   < > = values in this interval not displayed.   GFR: Estimated Creatinine Clearance: 28.7 mL/min (A) (by C-G formula based on SCr of 3.29 mg/dL (H)). Recent Labs  Lab 04/24/2019 2327 04/20/19 0255 04/20/19 [id number] 04/21/19 0528 04/22/19 0341  04/23/19 0358  WBC  --   --  15.3* 9.7 10.6* 9.0  LATICACIDVEN 0.7 0.9  --   --   --   --     Liver Function Tests: Recent Labs  Lab 04/17/19 0320 04/05/2019 0249  04/20/19 0639  04/21/19 0528 04/21/19 1604 04/22/19 0341 04/22/19 1601 04/23/19 0358  AST 16 25  --  46*  --   --   --   --   --   --   ALT 15 14  --  16  --   --   --   --   --   --   ALKPHOS 145* 254*  --  393*  --   --   --   --   --   --   BILITOT 3.7* 3.7*  --  3.7*  --   --   --   --   --   --   PROT 6.3* 6.3*  --  6.0*  --   --   --   --   --   --   ALBUMIN 1.7* 1.7*   < > 1.7*   < > 1.6* 1.7* 1.8* 1.6* 1.6*   < > = values in this interval not displayed.   No results for input(s): LIPASE, AMYLASE in the last 168 hours. Recent Labs  Lab 04/20/19 0255  AMMONIA 23    ABG    Component Value Date/Time   PHART 7.348 (L) 04/22/2019 0133   PCO2ART 34.7 04/22/2019 0133   PO2ART 75.0 (L) 04/22/2019 0133   HCO3 19.9 (L) 04/22/2019 0133   TCO2 21 (L) 04/22/2019 0133   ACIDBASEDEF 6.0 (H) 04/22/2019 0133   O2SAT 96.0 04/22/2019 0133     Coagulation Profile: No results for input(s): INR, PROTIME in the last 168 hours.  Cardiac Enzymes: No results for input(s): CKTOTAL, CKMB, CKMBINDEX, TROPONINI in the last 168 hours.  HbA1C: Hgb A1c MFr Bld  Date/Time Value Ref Range Status  04/17/2019 03:20 AM 6.9 (H) 4.8 - 5.6 % Final    Comment:    (NOTE) Pre diabetes:          5.7%-6.4% Diabetes:              >6.4% Glycemic control for   <7.0% adults with diabetes    05/26/2018 04:57 AM 7.2 (H) 4.8 - 5.6 % Final    Comment:    (NOTE) Pre diabetes:          5.7%-6.4% Diabetes:              >6.4% Glycemic control for   <7.0% adults with diabetes     CBG: Recent Labs  Lab 04/22/19 0757 04/22/19 1200 04/22/19 1721 04/22/19 2337 04/23/19 [place], [place] ICU Physician [place]  Pager: [phone] Mobile: [phone] After hours: [phone].

## 2019-04-23 NOTE — Progress Notes (Signed)
1312 CPR started, [place], PEA, Sat in the 70% 1313 Pads placed 1314 EPI 1316 EPI 1317 Pulses regained

## 2019-04-23 NOTE — Procedures (Signed)
Cortrak  Person Inserting Tube:  Lynnmarie Lovett, RD Tube Type:  Cortrak - 43 inches Tube Location:  Left nare Initial Placement:  Stomach Secured by: Bridle Technique Used to Measure Tube Placement:  Documented cm marking at nare/ corner of mouth Cortrak Secured At:  73 cm   No x-ray is required. RN may begin using tube.   If the tube becomes dislodged please keep the tube and contact the Cortrak team at [email or web address] (password [email or web address]) for replacement.  If after hours and replacement cannot be delayed, place a NG tube and confirm placement with an abdominal x-ray.    Mariana Single RD, LDN Clinical Nutrition Pager # [phone]

## 2019-04-23 NOTE — Progress Notes (Signed)
RN Nira Conn notified of ABG results.

## 2019-04-23 NOTE — Procedures (Signed)
Intubation Procedure Note  Derrika Ruffalo  [id number] [birth year]   Procedure: Intubation Indications: Respiratory insufficiency  Procedure Details Consent: Unable to obtain consent because of emergent medical necessity. Time Out: Verified patient identification, verified procedure, site/side was marked, verified correct patient position, special equipment/implants available, medications/allergies/relevent history reviewed, required imaging and test results available.  Performed  Pre-oxygenation: 100% via  Premedication: none Position: supine Induction agent: none Paralytic: none Technique: DL MAC 4 Tube size: 7.5 Laryngoscopy view: grade 1 Number of attempts: 1 Insertion depth: 22cm Tube secured: tube holder Other findings:   OGT/NGT inserted: yes Position confirmed by auscultation: yes  Evaluation Colorimetric change: yes Bilateral breath sounds: yes Hemodynamic Status: Persistent hypotension treated with pressors and fluid; O2 sats: stable throughout Patient's Current Condition: unstable Complications: No apparent complications Patient did tolerate procedure well. Chest X-ray ordered to verify placement.  CXR: pending.   Einar Grad Filimon Miranda 05/06/2018

## 2019-04-23 NOTE — Progress Notes (Signed)
Nutrition Follow-up  DOCUMENTATION CODES:   Morbid obesity  INTERVENTION:   Tube Feeding:  Nepro at 50 ml/hr Pro-Stat 30 mL daily Provides 2260 kcals, 112 g of protein and 876 mL of free water Meets 100% estimated calorie and protein needs Monitor electrolytes, TF tolerance and make adjustments to TF formula as needed  Continue Rena-Vit   NUTRITION DIAGNOSIS:   Increased nutrient needs related to acute illness as evidenced by estimated needs.  Being addressed via TF   GOAL:   Patient will meet greater than or equal to 90% of their needs  Progressing  MONITOR:   PO intake, Supplement acceptance, Labs, Weight trends, Skin  REASON FOR ASSESSMENT:   Rounds    ASSESSMENT:   38 yo female admitted with critically ill due to septic shock from MRSA bacteremia, encephalopathy AKI requiring CRRT. PMH includes ESRD on HD, DVT, DM, HTN, RA, CHF, anemia  10/24 Admit 10/27 Transferred to ICU, CRRT initiated 10/29 CRRT discontinued 10/30 Cortrak placed-Gastric   Spoke with RN who indicates pt is not alert enough to take po. Yesterday pt was calling out repeatedly; spoke with RN yesterday who indicated that pt did not eat breakfast but was able to drink Nepro shake. No recorded po intake since 10/26 Today pt obtunded/lethargic but will arouse to voice but not long enough to take anything by mouth. Cortrak placed today with plan for TF  Current wt 116.8 kg; admission weight 111.6 kg; lowest wt of 110 kg on 10/26. Unsure of outpatient EDW. Mild edema present in all extremities   Labs: CBGs 98-128, potassium 4.2 (wdl), phosphorus 3.0 (lower end for HD) Meds: calcitriol, aransep, rena-vit, ss novolog, renvela   Diet Order:   Diet Order            Diet regular Room service appropriate? Yes; Fluid consistency: Thin; Fluid restriction: 1200 mL Fluid  Diet effective now              EDUCATION NEEDS:   Not appropriate for education at this time  Skin:  Skin Assessment:  Reviewed RN Assessment  Last BM:  10/28  Height:   Ht Readings from Last 1 Encounters:  04/12/2019 5\' 2"  (1.575 m)    Weight:   Wt Readings from Last 1 Encounters:  04/23/19 116.8 kg    Ideal Body Weight:  50 kg  BMI:  Body mass index is 47.1 kg/m.  Estimated Nutritional Needs:   Kcal:  2200-2400 kcals  Protein:  110-130 g  Fluid:  1000 mL plus UPOP   [employer] MS, RDN, LDN, CNSC [phone] Pager  [phone] Weekend/On-Call Pager

## 2019-04-23 NOTE — Progress Notes (Signed)
CRITICAL VALUE ALERT  Critical Value:  PTT 174  Date & Time Notied:  10/30 0736  Provider Notified: Dr. Lynetta Mare  Orders Received/Actions taken: Stop Heparin after dialysis

## 2019-04-23 NOTE — Progress Notes (Signed)
Critical Care Attending  Sudden respiratory decompensation following MRI.  Cardiorespiratory arrest with ROSC after 63min CPR. Intubated easily. Started on vasopressors for low BP  MRI shows nil acute in brain. No evidence of hip osteomyelitis.   Mother of patient updated.  CRITICAL CARE Performed by: Kipp Brood   Total critical care time: 30 minutes  Critical care time was exclusive of separately billable procedures and treating other patients.  Critical care was necessary to treat or prevent imminent or life-threatening deterioration.  Critical care was time spent personally by me on the following activities: development of treatment plan with patient and/or surrogate as well as nursing, discussions with consultants, evaluation of patient's response to treatment, examination of patient, obtaining history from patient or surrogate, ordering and performing treatments and interventions, ordering and review of laboratory studies, ordering and review of radiographic studies, pulse oximetry, re-evaluation of patient's condition and participation in multidisciplinary rounds.  Kipp Brood, MD [facility] ICU Physician [place]  Pager: [phone] Mobile: [phone] After hours: [phone].    04/23/2019, 2:11 PM

## 2019-04-24 DIAGNOSIS — B9562 Methicillin resistant Staphylococcus aureus infection as the cause of diseases classified elsewhere: Secondary | ICD-10-CM | POA: Diagnosis not present

## 2019-04-24 DIAGNOSIS — Z9911 Dependence on respirator [ventilator] status: Secondary | ICD-10-CM

## 2019-04-24 DIAGNOSIS — J9601 Acute respiratory failure with hypoxia: Secondary | ICD-10-CM | POA: Diagnosis not present

## 2019-04-24 DIAGNOSIS — R7881 Bacteremia: Secondary | ICD-10-CM | POA: Diagnosis not present

## 2019-04-24 LAB — RENAL FUNCTION PANEL
Albumin: 1.6 g/dL — ABNORMAL LOW (ref 3.5–5.0)
Albumin: 1.5 g/dL — ABNORMAL LOW (ref 3.5–5.0)
Anion gap: 14 (ref 5–15)
Anion gap: 13 (ref 5–15)
BUN: 19 mg/dL (ref 6–20)
BUN: 26 mg/dL — ABNORMAL HIGH (ref 6–20)
CO2: 24 mmol/L (ref 22–32)
CO2: 27 mmol/L (ref 22–32)
Calcium: 8.5 mg/dL — ABNORMAL LOW (ref 8.9–10.3)
Calcium: 8.5 mg/dL — ABNORMAL LOW (ref 8.9–10.3)
Chloride: 99 mmol/L (ref 98–111)
Chloride: 99 mmol/L (ref 98–111)
Creatinine, Ser: 3.01 mg/dL — ABNORMAL HIGH (ref 0.44–1.00)
Creatinine, Ser: 3.77 mg/dL — ABNORMAL HIGH (ref 0.44–1.00)
GFR calc Af Amer: 22 mL/min — ABNORMAL LOW (ref >60)
GFR calc Af Amer: 17 mL/min — ABNORMAL LOW (ref >60)
GFR calc non Af Amer: 19 mL/min — ABNORMAL LOW (ref >60)
GFR calc non Af Amer: 15 mL/min — ABNORMAL LOW (ref >60)
Glucose, Bld: 181 mg/dL — ABNORMAL HIGH (ref 70–99)
Glucose, Bld: 181 mg/dL — ABNORMAL HIGH (ref 70–99)
Phosphorus: 3.6 mg/dL (ref 2.5–4.6)
Phosphorus: 4.3 mg/dL (ref 2.5–4.6)
Potassium: 3.6 mmol/L (ref 3.5–5.1)
Potassium: 3.4 mmol/L — ABNORMAL LOW (ref 3.5–5.1)
Sodium: 137 mmol/L (ref 135–145)
Sodium: 139 mmol/L (ref 135–145)

## 2019-04-24 LAB — CBC
HCT: 27.4 % — ABNORMAL LOW (ref 36.0–46.0)
Hemoglobin: 8.4 g/dL — ABNORMAL LOW (ref 12.0–15.0)
MCH: 29.6 pg (ref 26.0–34.0)
MCHC: 30.7 g/dL (ref 30.0–36.0)
MCV: 96.5 fL (ref 80.0–100.0)
Platelets: 208 10*3/uL (ref 150–400)
RBC: 2.84 MIL/uL — ABNORMAL LOW (ref 3.87–5.11)
RDW: 16.6 % — ABNORMAL HIGH (ref 11.5–15.5)
WBC: 23.8 10*3/uL — ABNORMAL HIGH (ref 4.0–10.5)
nRBC: 0 % (ref 0.0–0.2)

## 2019-04-24 LAB — GLUCOSE, CAPILLARY
Glucose-Capillary: 150 mg/dL — ABNORMAL HIGH (ref 70–99)
Glucose-Capillary: 158 mg/dL — ABNORMAL HIGH (ref 70–99)
Glucose-Capillary: 169 mg/dL — ABNORMAL HIGH (ref 70–99)
Glucose-Capillary: 171 mg/dL — ABNORMAL HIGH (ref 70–99)
Glucose-Capillary: 176 mg/dL — ABNORMAL HIGH (ref 70–99)
Glucose-Capillary: 65 mg/dL — ABNORMAL LOW (ref 70–99)
Glucose-Capillary: 96 mg/dL (ref 70–99)

## 2019-04-24 LAB — MAGNESIUM
Magnesium: 2.2 mg/dL (ref 1.7–2.4)
Magnesium: 2.4 mg/dL (ref 1.7–2.4)

## 2019-04-24 LAB — PHOSPHORUS: Phosphorus: 4.3 mg/dL (ref 2.5–4.6)

## 2019-04-24 MED ORDER — GABAPENTIN 300 MG PO CAPS
300.0000 mg | ORAL_CAPSULE | Freq: Three times a day (TID) | ORAL | Status: DC
  Administered 2019-04-24 (×2): 300 mg via ORAL
  Filled 2019-04-24 (×2): qty 1

## 2019-04-24 MED ORDER — ASPIRIN 81 MG PO CHEW
81.0000 mg | CHEWABLE_TABLET | Freq: Every day | ORAL | Status: DC
  Administered 2019-04-24 – 2019-04-30 (×7): 81 mg
  Filled 2019-04-24 (×7): qty 1

## 2019-04-24 MED ORDER — DEXTROSE 50 % IV SOLN
25.0000 mL | Freq: Once | INTRAVENOUS | Status: AC
  Administered 2019-04-24: 13:00:00 25 mL via INTRAVENOUS

## 2019-04-24 MED ORDER — GABAPENTIN 250 MG/5ML PO SOLN
300.0000 mg | Freq: Three times a day (TID) | ORAL | Status: DC
  Administered 2019-04-25 (×3): 300 mg via ORAL
  Filled 2019-04-24 (×4): qty 6

## 2019-04-24 MED FILL — Medication: Qty: 1 | Status: AC

## 2019-04-24 NOTE — Progress Notes (Signed)
[facility] ROUNDING NOTE   Subjective:   This is a 38 year old female with a history of end-stage renal disease history of DVT diabetes hypertension rheumatoid arthritis and MRSA bacteremia of unclear etiology.  She developed transient hypotension requiring low-dose pressors and was transferred to the intensive care unit.  Patient decompensated with  Shock and bradycardia 04/20/2019.  Hyperkalemia initiation of CRRT 04/20/2019 until 04/22/2019.   Patient tolerated hemodialysis 04/23/2019 with 2 L removed.  She continues to have diarrhea stools.  Blood pressure 123/51 pulse 97 temperature 99.7 O2 sats 100% FiO2 40%  IV norepinephrine  Sodium 137 potassium 3.6 chloride 99 CO2 24 BUN 19 creatinine 3 glucose 181 phosphorus 3.6 calcium 8.5 magnesium 2.2 WBC 23.8 hemoglobin 8.4 platelets 208  Allopurinol 100 mg twice daily aspirin 81 mg daily Calcitrol 0.5 mcg Monday Wednesday Friday  darbepoetin 60 mcg q. Tuesday, multivitamins 1 daily Crestor 40 mg daily Renvela 800 mg with meals  Objective:  Vital signs in last 24 hours:  Temp:  [97.8 F (36.6 C)-101.9 F (38.8 C)] 99.7 F (37.6 C) (10/31 1015) Pulse Rate:  [76-99] 97 (10/31 1015) Resp:  [0-28] 19 (10/31 1015) BP: (68-123)/(22-66) 123/51 (10/31 1015) SpO2:  [91 %-100 %] 100 % (10/31 1015) FiO2 (%):  [40 %-100 %] 40 % (10/31 0800) Weight:  [113.7 kg] 113.7 kg (10/31 0359)  Weight change:  Filed Weights   04/22/19 0339 04/23/19 0705 04/24/19 0359  Weight: 115.1 kg 116.8 kg 113.7 kg    Intake/Output: I/O last 3 completed shifts: In: 1849.9 [I.V.:1529.8; NG/GT:120; IV Piggyback:200.1] Out: 2850 [Emesis/NG output:850; Other:2000]   Intake/Output this shift:  Total I/O In: 155.6 [I.V.:155.6] Out: -   General:  AAOx3 NAD HEENT: MMM [place] AT anicteric sclera Neck:  No JVD, no adenopathy CV:  Heart RRR  Lungs:  L/S CTA bilaterally Abd:  abd SNT/ND with normal BS GU:  Bladder non-palpable Extremities:  No LE  edema. Skin:  No skin rash   Basic Metabolic Panel: Recent Labs  Lab 04/21/19 0528  04/22/19 0341 04/22/19 1601 04/23/19 0358 04/23/19 1434 04/23/19 1508 04/24/19 0325  NA 135   < > 136 136 137 137 137 137  K 4.7   < > 4.3 4.4 4.2 3.5 3.6 3.6  CL 101   < > 101 101 102 101  --  99  CO2 21*   < > 24 20* 20* 25  --  24  GLUCOSE 100*   < > 79 156* 134* 193*  --  181*  BUN 38*   < > 12 20 29* 14  --  19  CREATININE 4.78*   < > 2.33* 2.82* 3.29* 2.18*  --  3.01*  CALCIUM 7.2*   < > 7.8* 8.3* 8.5* 8.1*  --  8.5*  MG 2.3  --  2.5*  --  2.7* 2.1  --  2.2  PHOS 4.7*   < > 2.4* 2.9 3.0 3.7  --  3.6   < > = values in this interval not displayed.    Liver Function Tests: Recent Labs  Lab 04/02/2019 0249  04/20/19 0639  04/22/19 0341 04/22/19 1601 04/23/19 0358 04/23/19 1434 04/24/19 0325  AST 25  --  46*  --   --   --   --  99*  --   ALT 14  --  16  --   --   --   --  34  --   ALKPHOS 254*  --  393*  --   --   --   --  654*  --   BILITOT 3.7*  --  3.7*  --   --   --   --  3.0*  --   PROT 6.3*  --  6.0*  --   --   --   --  6.3*  --   ALBUMIN 1.7*   < > 1.7*   < > 1.8* 1.6* 1.6* 1.7* 1.6*   < > = values in this interval not displayed.   No results for input(s): LIPASE, AMYLASE in the last 168 hours. Recent Labs  Lab 04/20/19 0255  AMMONIA 23    CBC: Recent Labs  Lab 04/21/19 0528  04/22/19 0341 04/23/19 0358 04/23/19 1434 04/23/19 1508 04/24/19 0325  WBC 9.7  --  10.6* 9.0 14.0*  --  23.8*  HGB 9.7*   < > 10.0* 8.6* 8.6* 9.9* 8.4*  HCT 30.7*   < > 32.0* 28.1* 28.1* 29.0* 27.4*  MCV 94.8  --  96.1 96.2 97.9  --  96.5  PLT 190  --  223 193 206  --  208   < > = values in this interval not displayed.    Cardiac Enzymes: No results for input(s): CKTOTAL, CKMB, CKMBINDEX, TROPONINI in the last 168 hours.  BNP: Invalid input(s): POCBNP  CBG: Recent Labs  Lab 04/23/19 1952 04/23/19 2206 04/23/19 2350 04/24/19 0343 04/24/19 0808  GLUCAP 190* 176* 169* 158* 96     Microbiology: Results for orders placed or performed during the hospital encounter of 03/25/2019  SARS CORONAVIRUS 2 (TAT 6-24 HRS) Nasopharyngeal Nasopharyngeal Swab     Status: None   Collection Time: 04/17/19 12:27 AM   Specimen: Nasopharyngeal Swab  Result Value Ref Range Status   SARS Coronavirus 2 NEGATIVE NEGATIVE Final    Comment: (NOTE) SARS-CoV-2 target nucleic acids are NOT DETECTED. The SARS-CoV-2 RNA is generally detectable in upper and lower respiratory specimens during the acute phase of infection. Negative results do not preclude SARS-CoV-2 infection, do not rule out co-infections with other pathogens, and should not be used as the sole basis for treatment or other patient management decisions. Negative results must be combined with clinical observations, patient history, and epidemiological information. The expected result is Negative. Fact Sheet for Patients: SugarRoll.be Fact Sheet for Healthcare Providers: https://www.woods-mathews.com/ This test is not yet approved or cleared by the [place] FDA and  has been authorized for detection and/or diagnosis of SARS-CoV-2 by FDA under an Emergency Use Authorization (EUA). This EUA will remain  in effect (meaning this test can be used) for the duration of the COVID-19 declaration under Section 56 4(b)(1) of the Act, 21 U.S.C. section 360bbb-3(b)(1), unless the authorization is terminated or revoked sooner. Performed at [facility], [place] [address], [place], [place] [address]   Culture, blood (routine x 2)     Status: None   Collection Time: 04/18/19  8:29 AM   Specimen: BLOOD RIGHT HAND  Result Value Ref Range Status   Specimen Description BLOOD RIGHT HAND  Final   Special Requests   Final    AEROBIC BOTTLE ONLY Blood Culture results may not be optimal due to an inadequate volume of blood received in culture bottles   Culture   Final    NO GROWTH 5  DAYS Performed at [facility], [place] [address] [place], [place] [address]    Report Status 04/23/2019 FINAL  Final  Culture, blood (routine x 2)     Status: None   Collection Time: 04/18/19  5:41 PM   Specimen: BLOOD RIGHT HAND  Result Value Ref Range Status   Specimen Description BLOOD RIGHT HAND  Final   Special Requests   Final    AEROBIC BOTTLE ONLY Blood Culture results may not be optimal due to an inadequate volume of blood received in culture bottles   Culture   Final    NO GROWTH 5 DAYS Performed at [facility], [place] [address] [place], [place] [address]    Report Status 04/23/2019 FINAL  Final  MRSA PCR Screening     Status: Abnormal   Collection Time: 04/10/2019  9:26 AM   Specimen: Nasopharyngeal  Result Value Ref Range Status   MRSA by PCR POSITIVE (A) NEGATIVE Final    Comment:        The GeneXpert MRSA Assay (FDA approved for NASAL specimens only), is one component of a comprehensive MRSA colonization surveillance program. It is not intended to diagnose MRSA infection nor to guide or monitor treatment for MRSA infections. RESULT CALLED TO, READ BACK BY AND VERIFIED WITH: Jefferson Fuel RN 12:05 04/04/2019 ([email or web address]) Performed at [facility], [place] [address], [place], [place] [address]     Coagulation Studies: No results for input(s): LABPROT, INR in the last 72 hours.  Urinalysis: No results for input(s): COLORURINE, LABSPEC, PHURINE, GLUCOSEU, HGBUR, BILIRUBINUR, KETONESUR, PROTEINUR, UROBILINOGEN, NITRITE, LEUKOCYTESUR in the last 72 hours.  Invalid input(s): APPERANCEUR    Imaging: Dg Chest 1 View  Result Date: 04/23/2019 CLINICAL DATA:  Hypotension, CPR EXAM: CHEST  1 VIEW COMPARISON:  04/20/2019 FINDINGS: Interval placement of endotracheal tube with distal tip terminating at the level of the carina. Enteric tube courses below the diaphragm with distal tip beyond the inferior margin of the film. Stable cardiomegaly. Ill-defined  interstitial opacities bilaterally, improved compared to prior. No pleural effusion or pneumothorax. No acute osseous findings. IMPRESSION: 1. Endotracheal tube terminating at the level of the carina. Recommend retraction approximately 3 cm. 2. Slight interval improvement of bilateral interstitial opacities. These results will be called to the ordering clinician or representative by the Radiologist Assistant, and communication documented in the PACS or zVision Dashboard. Electronically Signed   By: Davina Poke M.D.   On: 04/23/2019 14:03   Dg Abd 1 View  Result Date: 04/23/2019 CLINICAL DATA:  Orogastric tube insertion EXAM: ABDOMEN - 1 VIEW COMPARISON:  06/11/2018 FINDINGS: A limited view of the upper abdomen was obtained for the purposes of enteric tube localization. An enteric tube is identified coursing below the diaphragm with distal tip and side port terminating in the expected location of the gastric body. Numerous overlying leads and devices are present. No gross free intraperitoneal air on limited view. IMPRESSION: Orogastric tube appropriately positioned within the expected location of the gastric body. Electronically Signed   By: Davina Poke M.D.   On: 04/23/2019 14:00   Mr Brain Wo Contrast  Result Date: 04/23/2019 CLINICAL DATA:  Altered mental status. End-stage renal disease. Bacteremia. EXAM: MRI HEAD WITHOUT CONTRAST TECHNIQUE: Multiplanar, multiecho pulse sequences of the brain and surrounding structures were obtained without intravenous contrast. COMPARISON:  Head CT 04/17/2019 FINDINGS: Brain: No abnormality of the brainstem or cerebellum. There are foci of abnormal restricted diffusion and T2 signal within the corpus callosum, affecting the genu on the left and much of the body. The differential diagnosis includes post viral demyelination, toxic demyelination, multiple sclerosis and neuromyelitis optica. Autoimmune encephalopathy/vasculopathy ([place] syndrome) is possible.  Occasionally, posterior reversible encephalopathy can involve only the  corpus callosum. Cytotoxic lesions of the corpus callosum can occur subsequent to various immune mediated responses. Elsewhere, the cerebral hemispheres appear normal. No evidence of hemorrhage, hydrocephalus or extra-axial collection. Vascular: Major vessels at the base of the brain show flow. Skull and upper cervical spine: Negative Sinuses/Orbits: Mucosal inflammatory changes of the paranasal sinuses as seen previously. Orbits negative. 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 Broad differential diagnosis as above. Electronically Signed   By: Nelson Chimes M.D.   On: 04/23/2019 12:47   Mr Hip Right Wo Contrast  Result Date: 04/23/2019 CLINICAL DATA:  MRSA bacteremia of unknown source. EXAM: MR OF THE RIGHT HIP WITHOUT CONTRAST TECHNIQUE: Multiplanar, multisequence MR imaging was performed. No intravenous contrast was administered. COMPARISON:  CT scan dated 04/20/2019 FINDINGS: Bones: There is minimal edema in the superior posterolateral aspect of the right femoral head, likely degenerative. No other significant bone abnormality of the hips or pelvis. Chronic low signal intensity from the bones is felt to be related to the patient's chronic renal disease. Articular cartilage and labrum Articular cartilage: Slight diffuse thinning of the articular cartilage, with more thinning posteriorly than elsewhere in the joint. Labrum:  Intact. Joint or bursal effusion Joint effusion:  Small joint effusion, similar to the opposite hip. Bursae: There is prominent right iliopsoas bursitis with fluid extending along the iliopsoas tendon anterior to the right hip. No greater trochanteric bursitis. Muscles and tendons Muscles and tendons: There is slight patchy edema in most of the muscles around the right hip. There is slight edema in the left gluteal muscles. No  abscesses. There is edema in the subcutaneous fat adjacent to the catheter in the left femoral vein. This could be due to hemorrhage from the catheter insertion or cellulitis. Other findings Miscellaneous:   Bilateral slight inguinal adenopathy, nonspecific. IMPRESSION: 1. Prominent right iliopsoas bursitis. 2. Edema in the subcutaneous fat adjacent to the catheter in the left femoral vein. This could be due to hemorrhage from the catheter insertion or cellulitis. 3. Slight arthritic changes of the right hip. 4. Slight nonspecific edema in the muscles of the right hip and buttock. Electronically Signed   By: Lorriane Shire M.D.   On: 04/23/2019 13:20     Medications:   . sodium chloride    . sodium chloride    . sodium chloride    . feeding supplement (NEPRO CARB STEADY)    . norepinephrine (LEVOPHED) Adult infusion 12 mcg/min (04/24/19 1000)  . vancomycin Stopped (04/23/19 1546)   . allopurinol  100 mg Oral BID  . aspirin EC  81 mg Oral Daily  . calcitRIOL  0.5 mcg Oral Q M,W,F-HD  . chlorhexidine gluconate (MEDLINE KIT)  15 mL Mouth Rinse BID  . Chlorhexidine Gluconate Cloth  6 each Topical Daily  . Chlorhexidine Gluconate Cloth  6 each Topical Q0600  . darbepoetin (ARANESP) injection - DIALYSIS  60 mcg Intravenous Q Tue-HD  . feeding supplement (PRO-STAT SUGAR FREE 64)  30 mL Per Tube Daily  . gabapentin  300 mg Oral TID  . insulin aspart  0-9 Units Subcutaneous Q4H  . mouth rinse  15 mL Mouth Rinse 10 times per day  . multivitamin  1 tablet Oral QHS  . mupirocin ointment   Nasal BID  . rosuvastatin  40 mg Oral QHS  . sevelamer carbonate  800 mg Oral TID WC   sodium chloride, sodium chloride, acetaminophen **OR** acetaminophen, alteplase, alteplase,  fentaNYL (SUBLIMAZE) injection, haloperidol lactate, heparin, lidocaine (PF), lidocaine-prilocaine, ondansetron **OR** ondansetron (ZOFRAN) IV, pentafluoroprop-tetrafluoroeth, traMADol  Assessment/ Plan:  1. MRSA bacteremia-  persistent without obvious source  - ID consulted and TEE negative for vegetations. She did have a prior MRSA hand wound 07/2018.  Unclear etiology continues on vancomycin 4 weeks therapy recommended by infectious disease appreciate assistance 2. ESRD- MWF -[place] -hypotension and shock initiated CRRT 04/20/2019 until 04/22/2019.  Tolerated intermittent hemodialysis 04/23/2019 with removal of 2 L.  Next dialysis per 04/26/2019 3. Hypertension/volume-CRRT has been discontinued 04/22/2019 intermittent hemodialysis began 04/23/2019 4. Anemia- hgb 10.5>9.9 Started Aranesp 60 Tuesday  5. Metabolic bone disease- Continue calcitriol with HD.    Renvela added as binder  6. Nutrition - alb low - renal carb mod diet - add protein suppl 7. Transient confusion and agitation during prior acute admission prior to transfer to [facility] 8. DM/gout/GERD -meds per primary 9. Rheumatoid arthritis -stable not issue at this time 10.Recently diagnosed RLE DVT 04/11/19 - heparin IV for now 11.Elevated T. Bili - present at transfer     LOS: [place] _0 _1 :40 AM

## 2019-04-24 NOTE — Progress Notes (Signed)
NAME:  Carla Compton, MRN:  [id number], DOB:  [birth year], LOS: 8 ADMISSION DATE:  04/23/2019, CONSULTATION DATE:  04/24/19 CHIEF COMPLAINT:  Hypotension, sepsis   Brief History   38 y.o. F with ESRD and DVT who was transferred from 10/24 from [place], [place] for MRSA bacteremia and intermittent confusion of unclear source.  She has had two negative CT head's, Echo and TEE without vegetation and non-specific findings on CXR.   She has been on Vancomycin since admission.   Over the last several hours, BP has been down-trending to SBP 70-80's with increased confusion. On dialysis MWF and per nursing did not get dialysis today secondary to hypotension .  Given 1L NS without improvement therefore PCCM consulted.  History of present illness     Carla Compton is a 38 y.o. F with PMH of recently diagnosed RLE DVT on Xarelto, ESRD, Type 2 DM, Rheumatoid arthritis, HTN and HL who was initially admitted in [place] 10/19 for septic shock.  No clear source was identified, 4/4 BC grew MRSA and she was transferred to [facility] for ID consult and further management.  Per notes,  She was initially on Levophed and Vancomycin; pressors were weaned and abx changed from Vancomycin to Zyvox and meropenem.  She had initial episodes of confusion and had a negative CT head and spine per notes, RLE US showed some fluid collection concerning for cellulitis, no abscess.  Since admission on 10/24 she has been treated with Vancomycin, TEE negative for vegetations.  Repeat BC are pending and no fevers since admission, WBC up-trending from 9.5->13.0.  Overnight, Pt's SBP has been down-trending from baseline 90-100 to as low as 70/43.  RN notes increasing confusion.   On exam, pt is sleeping but arousable to voice.  She appears obtunded and will nod head and give one-word answers.    Past Medical History   Past Medical History:  Diagnosis Date  . Anemia    2019  . Benign essential HTN   . CHF (congestive heart failure) ([place])   .  CKD (chronic kidney disease) stage 3, GFR 30-59 ml/min   . Diabetes ([place])    Rheumatoid Arthritis   Significant Hospital Events   10/24>>Admitted from OSH in [place] 10/27>>Txfr to [facility] ([place]  10/27 >> CRRT initiated 10/29>> CRRT discontinued.   Consults:  Nephrology ID PCCM  Procedures:  10/27-insertion of temporary hemodialysis catheter right femoral vein  Significant Diagnostic Tests:  10/24 CT head>>No acute findings  10/24 CXR>>Cardiomegaly with mild vascular congestion. Pneumonia is not Excluded. 10/27 CT RLE>> possible right foot osteomyelitis MRI head>>pending  Micro Data:  10/26 BC x2>>negative. 10/26 MRSA screen>>positive 10/24 Sars-CoV-2>>neg  Antimicrobials:  Vancomycin 10/24-  Interim history/subjective:  Remains intubated since yesterday.  No issues overnight.  Objective   Blood pressure (!) 123/51, pulse 97, temperature 99.7 F (37.6 C), temperature source Oral, resp. rate 19, height 5\' 2"  (1.575 m), weight 113.7 kg, last menstrual period 04/15/2019, SpO2 100 %. on 3L [place]    Vent Mode: PRVC FiO2 (%):  [40 %-100 %] 40 % Set Rate:  [18 bmp] 18 bmp Vt Set:  [400 mL] 400 mL PEEP:  [5 cmH20] 5 cmH20 Plateau Pressure:  [24 cmH20-27 cmH20] 26 cmH20   Intake/Output Summary (Last 24 hours) at 04/24/2019 1105 Last data filed at 04/24/2019 1000 Gross per 24 hour  Intake 1813.44 ml  Output 2850 ml  Net -1036.56 ml   Filed Weights   04/22/19 0339 04/23/19 0705 04/24/19 0359  Weight: 115.1 kg 116.8  kg 113.7 kg    General: Obese female intubated on mechanical life support HEENT: NCAT, sclera clear pupils reactive Neuro: Alert on mechanical ventilation follows basic commands CV: Regular rate and rhythm, S1-S2, no MRG PULM: Bilateral ventilated breath sounds GI: Soft nontender nondistended bowel sounds present Extremities: No significant edema, right groin line Skin: Bandage over posterior right neck.  Previous CVC placement.  Resolved Hospital Problem list      Assessment & Plan:   Septic Shock secondary to MRSA bacteremia Back on vasopressors Wean norepinephrine maintain MAP greater than 65 Continue vancomycin MRI of the right hip with prominent right iliopsoas bursitis Nonspecific edema in the muscles of the right hip. We will consult orthopedics to have them review the images and any recommendations.  Acute metabolic encephalopathy secondary to above  Holding home Seroquel. Mental status seems to be better.  She is able to follow commands today.  ESRD on iHD MWF Continue dialysis per nephrology. Appreciate their input.  Seropositive, erosive rheumatoid arthritis -Follows with [facility], last note in 03/2018 was on biologic therapy, however nephrology note from two months ago notes pt takes Plaquenil -Continue plans for outpatient follow-up.  Best practice:  Diet: NPO Pain/Anxiety/Delirium protocol (if indicated): n/a VAP protocol (if indicated): n/a DVT prophylaxis: heparin GI prophylaxis: n/a Glucose control: Lantus and SSI Mobility: bed rest Code Status: Full Family Communication: Mother Disposition: ICU  Labs   CBC: Recent Labs  Lab 04/21/19 0528  04/22/19 0341 04/23/19 0358 04/23/19 1434 04/23/19 1508 04/24/19 0325  WBC 9.7  --  10.6* 9.0 14.0*  --  23.8*  HGB 9.7*   < > 10.0* 8.6* 8.6* 9.9* 8.4*  HCT 30.7*   < > 32.0* 28.1* 28.1* 29.0* 27.4*  MCV 94.8  --  96.1 96.2 97.9  --  96.5  PLT 190  --  223 193 206  --  208   < > = values in this interval not displayed.    Basic Metabolic Panel: Recent Labs  Lab 04/21/19 0528  04/22/19 0341 04/22/19 1601 04/23/19 0358 04/23/19 1434 04/23/19 1508 04/24/19 0325  NA 135   < > 136 136 137 137 137 137  K 4.7   < > 4.3 4.4 4.2 3.5 3.6 3.6  CL 101   < > 101 101 102 101  --  99  CO2 21*   < > 24 20* 20* 25  --  24  GLUCOSE 100*   < > 79 156* 134* 193*  --  181*  BUN 38*   < > 12 20 29* 14  --  19  CREATININE 4.78*   < > 2.33* 2.82* 3.29*  2.18*  --  3.01*  CALCIUM 7.2*   < > 7.8* 8.3* 8.5* 8.1*  --  8.5*  MG 2.3  --  2.5*  --  2.7* 2.1  --  2.2  PHOS 4.7*   < > 2.4* 2.9 3.0 3.7  --  3.6   < > = values in this interval not displayed.   GFR: Estimated Creatinine Clearance: 30.8 mL/min (A) (by C-G formula based on SCr of 3.01 mg/dL (H)). Recent Labs  Lab 04/18/2019 2327 04/20/19 0255  04/22/19 0341 04/23/19 0358 04/23/19 1434 04/24/19 0325  WBC  --   --    < > 10.6* 9.0 14.0* 23.8*  LATICACIDVEN 0.7 0.9  --   --   --   --   --    < > = values in this interval not  displayed.    Liver Function Tests: Recent Labs  Lab 04/11/2019 0249  04/20/19 0639  04/22/19 0341 04/22/19 1601 04/23/19 0358 04/23/19 1434 04/24/19 0325  AST 25  --  46*  --   --   --   --  99*  --   ALT 14  --  16  --   --   --   --  34  --   ALKPHOS 254*  --  393*  --   --   --   --  654*  --   BILITOT 3.7*  --  3.7*  --   --   --   --  3.0*  --   PROT 6.3*  --  6.0*  --   --   --   --  6.3*  --   ALBUMIN 1.7*   < > 1.7*   < > 1.8* 1.6* 1.6* 1.7* 1.6*   < > = values in this interval not displayed.   No results for input(s): LIPASE, AMYLASE in the last 168 hours. Recent Labs  Lab 04/20/19 0255  AMMONIA 23    ABG    Component Value Date/Time   PHART 7.269 (L) 04/23/2019 1508   PCO2ART 61.5 (H) 04/23/2019 1508   PO2ART 347.0 (H) 04/23/2019 1508   HCO3 28.2 (H) 04/23/2019 1508   TCO2 30 04/23/2019 1508   ACIDBASEDEF 6.0 (H) 04/22/2019 0133   O2SAT 100.0 04/23/2019 1508     Coagulation Profile: No results for input(s): INR, PROTIME in the last 168 hours.  Cardiac Enzymes: No results for input(s): CKTOTAL, CKMB, CKMBINDEX, TROPONINI in the last 168 hours.  HbA1C: Hgb A1c MFr Bld  Date/Time Value Ref Range Status  04/17/2019 03:20 AM 6.9 (H) 4.8 - 5.6 % Final    Comment:    (NOTE) Pre diabetes:          5.7%-6.4% Diabetes:              >6.4% Glycemic control for   <7.0% adults with diabetes   05/26/2018 04:57 AM 7.2 (H) 4.8 -  5.6 % Final    Comment:    (NOTE) Pre diabetes:          5.7%-6.4% Diabetes:              >6.4% Glycemic control for   <7.0% adults with diabetes     CBG: Recent Labs  Lab 04/23/19 1952 04/23/19 2206 04/23/19 2350 04/24/19 0343 04/24/19 0808  GLUCAP 190* 176* 169* 158* 96    This patient is critically ill with multiple organ system failure; which, requires frequent high complexity decision making, assessment, support, evaluation, and titration of therapies. This was completed through the application of advanced monitoring technologies and extensive interpretation of multiple databases. During this encounter critical care time was devoted to patient care services described in this note for 34 minutes.  Garner Nash, DO [facility] 04/24/2019 11:06 AM

## 2019-04-24 NOTE — Progress Notes (Signed)
Patient ID: Carla Compton, female   DOB: 1980-10-02, 38 y.o.   MRN: [id number]  IR aware of Rt iliopsoas abscess aspiration drain procedure to be performed in IR tomorrow  PA will see pt in am See orders

## 2019-04-24 NOTE — Progress Notes (Signed)
Nutrition Follow-up  RD working remotely.  DOCUMENTATION CODES:   Morbid obesity  INTERVENTION:  Once able and stable to initiate enteral nutrition while intubated, Recommend Vital 1.5 formula at goal rate of 45 ml/h (1080 ml per day) and Prostat 30 ml TID to provide 1596 kcals, 126 gm protein, 875 ml free water daily.  NUTRITION DIAGNOSIS:   Increased nutrient needs related to acute illness as evidenced by estimated needs; ongoing  GOAL:   Patient will meet greater than or equal to 90% of their needs; not met  MONITOR:   Vent status, Skin, Weight trends, Labs, I & O's  REASON FOR ASSESSMENT:   Rounds    ASSESSMENT:   38 yo female admitted with critically ill due to septic shock from MRSA bacteremia, encephalopathy AKI requiring CRRT. PMH includes ESRD on HD, DVT, DM, HTN, RA, CHF, anemia   10/27 Transferred to ICU, CRRT initiated 10/29 CRRT discontinued 10/30 Cortrak placed-Gastric  Patient is currently intubated on ventilator support MV: 9.4 L/min Temp (24hrs), Avg:99.8 F (37.7 C), Min:97.8 F (36.6 C), Max:101.9 F (38.8 C)  Pt with respiratory decompensation following MRI yesterday. Pt underwent cardiorespiratory arrest with ROSC after 5 minutes CPR. Pt intubated. Tube feeding ordered yesterday morning due to continued poor po intake since admission, however was not initiated due to intubation.  Tube feeding recommendations stated above once able to initiate enteral nutrition while intubated.   Labs and medications reviewed. CBGs 65-190, potassium 3.6 (wdl), phosphorus 2.2 Meds: calcitriol, aransep, rena-vit, ss novolog, renvela  Diet Order:   Diet Order            Diet NPO time specified  Diet effective now              EDUCATION NEEDS:   Not appropriate for education at this time  Skin:  Skin Assessment: Reviewed RN Assessment  Last BM:  10/30  Height:   Ht Readings from Last 1 Encounters:  04/23/2019 '5\' 2"'  (1.575 m)    Weight:   Wt  Readings from Last 1 Encounters:  04/24/19 113.7 kg  04/03/2019 111.6 kg  Net I/O: +1.9 L since admit  Ideal Body Weight:  50 kg  BMI:  Body mass index is 45.85 kg/m.  Estimated Nutritional Needs:   Kcal:  4098-1191  Protein:  125-135 grams  Fluid:  1000 ml plus UOP    Corrin Parker, MS, RD, LDN Pager # [phone] After hours/ weekend pager # [phone]

## 2019-04-24 NOTE — Progress Notes (Signed)
Spoke with Dr. Valeta Harms, MRSA bacteremia of unknown source.  MRI of pelvis has some increased fluid in the psoas sheath.  Might be of benefit for IR to tap or place drain if they think this is technically possible/beneficial.  I don't think surgical exploration is indicated, and this is outside of my clinical experience, but a drain might help.   Johnny Bridge, MD

## 2019-04-24 NOTE — Progress Notes (Addendum)
PCCM:  I spoke with Dr. Mardelle Matte from Ortho and Dr. Anselm Pancoast from IR. Plans for CT guided aspiration of the right iliopsoas bursa.   Orders place. Probably scheduled for tomorrow per Dr. Anselm Pancoast.  She will need this cultured.   Garner Nash, DO [facility] 04/24/2019 2:52 PM

## 2019-04-25 ENCOUNTER — Inpatient Hospital Stay (HOSPITAL_COMMUNITY): Payer: BLUE CROSS/BLUE SHIELD

## 2019-04-25 DIAGNOSIS — M7071 Other bursitis of hip, right hip: Secondary | ICD-10-CM

## 2019-04-25 LAB — RENAL FUNCTION PANEL
Albumin: 1.5 g/dL — ABNORMAL LOW (ref 3.5–5.0)
Albumin: 1.5 g/dL — ABNORMAL LOW (ref 3.5–5.0)
Anion gap: 13 (ref 5–15)
Anion gap: 12 (ref 5–15)
BUN: 33 mg/dL — ABNORMAL HIGH (ref 6–20)
BUN: 38 mg/dL — ABNORMAL HIGH (ref 6–20)
CO2: 26 mmol/L (ref 22–32)
CO2: 27 mmol/L (ref 22–32)
Calcium: 8.5 mg/dL — ABNORMAL LOW (ref 8.9–10.3)
Calcium: 8.6 mg/dL — ABNORMAL LOW (ref 8.9–10.3)
Chloride: 101 mmol/L (ref 98–111)
Chloride: 101 mmol/L (ref 98–111)
Creatinine, Ser: 4.59 mg/dL — ABNORMAL HIGH (ref 0.44–1.00)
Creatinine, Ser: 5.09 mg/dL — ABNORMAL HIGH (ref 0.44–1.00)
GFR calc Af Amer: 13 mL/min — ABNORMAL LOW (ref >60)
GFR calc Af Amer: 12 mL/min — ABNORMAL LOW (ref >60)
GFR calc non Af Amer: 11 mL/min — ABNORMAL LOW (ref >60)
GFR calc non Af Amer: 10 mL/min — ABNORMAL LOW (ref >60)
Glucose, Bld: 112 mg/dL — ABNORMAL HIGH (ref 70–99)
Glucose, Bld: 126 mg/dL — ABNORMAL HIGH (ref 70–99)
Phosphorus: 4.6 mg/dL (ref 2.5–4.6)
Phosphorus: 5.4 mg/dL — ABNORMAL HIGH (ref 2.5–4.6)
Potassium: 3.7 mmol/L (ref 3.5–5.1)
Potassium: 3.7 mmol/L (ref 3.5–5.1)
Sodium: 140 mmol/L (ref 135–145)
Sodium: 140 mmol/L (ref 135–145)

## 2019-04-25 LAB — PROTIME-INR
INR: 1.3 — ABNORMAL HIGH (ref 0.8–1.2)
Prothrombin Time: 15.8 seconds — ABNORMAL HIGH (ref 11.4–15.2)

## 2019-04-25 LAB — CBC
HCT: 24.2 % — ABNORMAL LOW (ref 36.0–46.0)
Hemoglobin: 7.2 g/dL — ABNORMAL LOW (ref 12.0–15.0)
MCH: 29.5 pg (ref 26.0–34.0)
MCHC: 29.8 g/dL — ABNORMAL LOW (ref 30.0–36.0)
MCV: 99.2 fL (ref 80.0–100.0)
Platelets: 139 10*3/uL — ABNORMAL LOW (ref 150–400)
RBC: 2.44 MIL/uL — ABNORMAL LOW (ref 3.87–5.11)
RDW: 16.6 % — ABNORMAL HIGH (ref 11.5–15.5)
WBC: 12.8 10*3/uL — ABNORMAL HIGH (ref 4.0–10.5)
nRBC: 0 % (ref 0.0–0.2)

## 2019-04-25 LAB — GLUCOSE, CAPILLARY
Glucose-Capillary: 115 mg/dL — ABNORMAL HIGH (ref 70–99)
Glucose-Capillary: 115 mg/dL — ABNORMAL HIGH (ref 70–99)
Glucose-Capillary: 121 mg/dL — ABNORMAL HIGH (ref 70–99)
Glucose-Capillary: 133 mg/dL — ABNORMAL HIGH (ref 70–99)
Glucose-Capillary: 148 mg/dL — ABNORMAL HIGH (ref 70–99)
Glucose-Capillary: 95 mg/dL (ref 70–99)

## 2019-04-25 LAB — MAGNESIUM: Magnesium: 2.2 mg/dL (ref 1.7–2.4)

## 2019-04-25 MED ORDER — SODIUM CHLORIDE 0.9% FLUSH
5.0000 mL | Freq: Three times a day (TID) | INTRAVENOUS | Status: DC
  Administered 2019-04-25 – 2019-04-30 (×15): 5 mL

## 2019-04-25 MED ORDER — PANTOPRAZOLE SODIUM 40 MG IV SOLR
40.0000 mg | Freq: Every day | INTRAVENOUS | Status: DC
  Administered 2019-04-25 – 2019-04-28 (×4): 40 mg via INTRAVENOUS
  Filled 2019-04-25 (×4): qty 40

## 2019-04-25 MED ORDER — LIDOCAINE HCL 1 % IJ SOLN
INTRAMUSCULAR | Status: AC
  Administered 2019-04-25: 10:00:00
  Filled 2019-04-25: qty 20

## 2019-04-25 MED ORDER — MIDAZOLAM HCL 2 MG/2ML IJ SOLN
INTRAMUSCULAR | Status: AC
  Filled 2019-04-25: qty 2

## 2019-04-25 MED ORDER — FENTANYL CITRATE (PF) 100 MCG/2ML IJ SOLN
INTRAMUSCULAR | Status: AC
  Administered 2019-04-25: 10:00:00 via INTRAVENOUS
  Filled 2019-04-25: qty 2

## 2019-04-25 MED ORDER — FENTANYL CITRATE (PF) 100 MCG/2ML IJ SOLN
INTRAMUSCULAR | Status: AC | PRN
  Administered 2019-04-25 (×2): 25 ug via INTRAVENOUS

## 2019-04-25 MED ORDER — CHLORHEXIDINE GLUCONATE CLOTH 2 % EX PADS
6.0000 | MEDICATED_PAD | Freq: Every day | CUTANEOUS | Status: DC
  Administered 2019-04-25 – 2019-04-26 (×2): 6 via TOPICAL

## 2019-04-25 NOTE — Progress Notes (Signed)
NAME:  Carla Compton, MRN:  [id number], DOB:  [birth year], LOS: 9 ADMISSION DATE:  03/27/2019, CONSULTATION DATE:  04/25/19 CHIEF COMPLAINT:  Hypotension, sepsis   Brief History   38 y.o. F with ESRD and DVT who was transferred from 10/24 from [place], [place] for MRSA bacteremia and intermittent confusion of unclear source.  She has had two negative CT head's, Echo and TEE without vegetation and non-specific findings on CXR.   She has been on Vancomycin since admission.   Over the last several hours, BP has been down-trending to SBP 70-80's with increased confusion. On dialysis MWF and per nursing did not get dialysis today secondary to hypotension .  Given 1L NS without improvement therefore PCCM consulted.  History of present illness    Carla Compton is a 38 y.o. F with PMH of recently diagnosed RLE DVT on Xarelto, ESRD, Type 2 DM, Rheumatoid arthritis, HTN and HL who was initially admitted in [place] 10/19 for septic shock.  No clear source was identified, 4/4 BC grew MRSA and she was transferred to [facility] for ID consult and further management.  Per notes,  She was initially on Levophed and Vancomycin; pressors were weaned and abx changed from Vancomycin to Zyvox and meropenem.  She had initial episodes of confusion and had a negative CT head and spine per notes, RLE US showed some fluid collection concerning for cellulitis, no abscess.  Since admission on 10/24 she has been treated with Vancomycin, TEE negative for vegetations.  Repeat BC are pending and no fevers since admission, WBC up-trending from 9.5->13.0.  Overnight, Pt's SBP has been down-trending from baseline 90-100 to as low as 70/43.  RN notes increasing confusion.   On exam, pt is sleeping but arousable to voice.  She appears obtunded and will nod head and give one-word answers.    Past Medical History   Past Medical History:  Diagnosis Date  . Anemia    2019  . Benign essential HTN   . CHF (congestive heart failure) ([place])   .  CKD (chronic kidney disease) stage 3, GFR 30-59 ml/min   . Diabetes ([place])    Rheumatoid Arthritis   Significant Hospital Events   10/24>>Admitted from OSH in [place] 10/27>>Txfr to [facility]  10/27 >> CRRT initiated 10/29>> CRRT discontinued.   Consults:  Nephrology ID PCCM  Procedures:  10/27-insertion of temporary hemodialysis catheter right femoral vein  Significant Diagnostic Tests:  10/24 CT head>>No acute findings  10/24 CXR>>Cardiomegaly with mild vascular congestion. Pneumonia is not Excluded. 10/27 CT RLE>> possible right foot osteomyelitis MRI head>>pending 04/04/2019: Transesophageal echocardiogram no evidence of vegetation.  Micro Data:  10/26 BC x2>>negative. 10/26 MRSA screen>>positive 10/24 Sars-CoV-2>>neg  Antimicrobials:  Vancomycin 10/24-  Interim history/subjective:   Patient remains intubated on mechanical life support in the intensive care unit hypotensive on vasopressors.  Objective   Blood pressure (!) 106/58, pulse 89, temperature 98.4 F (36.9 C), resp. rate (!) 22, height 5\' 2"  (1.575 m), weight 113.7 kg, last menstrual period 04/18/2019, SpO2 98 %. on 3L [place]    Vent Mode: PSV;CPAP FiO2 (%):  [40 %] 40 % Set Rate:  [18 bmp] 18 bmp Vt Set:  [400 mL] 400 mL PEEP:  [5 cmH20] 5 cmH20 Pressure Support:  [10 cmH20] 10 cmH20 Plateau Pressure:  [16 cmH20-25 cmH20] 18 cmH20   Intake/Output Summary (Last 24 hours) at 04/25/2019 0850 Last data filed at 04/25/2019 0800 Gross per 24 hour  Intake 828.87 ml  Output 250 ml  Net 578.87  ml   Filed Weights   04/22/19 0339 04/23/19 0705 04/24/19 0359  Weight: 115.1 kg 116.8 kg 113.7 kg    General: Obese female, intubated on mechanical life support HEENT: NCAT, sclera clear pupils equal reactive to light Neuro: Alert to voice, opens eyes but quickly fades unable to follow commands this morning. CV: Regular rate and rhythm, S1-S2 no MRG PULM: Bilateral ventilated breath sounds GI: Soft, nontender,  nondistended, bowel sounds present, obese pannus Extremities: No significant edema, right groin line checked Skin: Bandage on the right posterior neck at previous CVC site this was not taken down this morning.  Resolved Hospital Problem list     Assessment & Plan:   Septic Shock secondary to MRSA bacteremia Patient remains on vasopressors. Continue to wean norepinephrine to maintain mean arterial pressure greater than 65. Continue vancomycin Plans for aspiration of the right iliopsoas bursa today by interventional radiology Continue to follow temperature and white blood cell count curve.  Does appear the white blood cell counts improving today.  Acute metabolic encephalopathy secondary to above  Continue holding home Seroquel As mental status improves will attempt SBT and consider liberation from ventilator. Currently mental status precludes liberation from vent.  ESRD on iHD MWF Continue dialysis per nephrology We appreciate their input.  Seropositive, erosive rheumatoid arthritis -Follows with [facility], last note in 03/2018 was on biologic therapy, however nephrology note from two months ago notes pt takes Plaquenil Continue plans for outpatient follow-up  Best practice:  Diet: NPO Pain/Anxiety/Delirium protocol (if indicated): n/a VAP protocol (if indicated): n/a DVT prophylaxis: heparin GI prophylaxis: n/a Glucose control: Lantus and SSI Mobility: bed rest Code Status: Full Family Communication: Mother Disposition: ICU  Labs   CBC: Recent Labs  Lab 04/22/19 0341 04/23/19 0358 04/23/19 1434 04/23/19 1508 04/24/19 0325 04/25/19 0702  WBC 10.6* 9.0 14.0*  --  23.8* 12.8*  HGB 10.0* 8.6* 8.6* 9.9* 8.4* 7.2*  HCT 32.0* 28.1* 28.1* 29.0* 27.4* 24.2*  MCV 96.1 96.2 97.9  --  96.5 99.2  PLT 223 193 206  --  208 139*    Basic Metabolic Panel: Recent Labs  Lab 04/23/19 0358 04/23/19 1434 04/23/19 1508 04/24/19 0325 04/24/19 1630 04/25/19  0702  NA 137 137 137 137 139 140  K 4.2 3.5 3.6 3.6 3.4* 3.7  CL 102 101  --  99 99 101  CO2 20* 25  --  24 27 26   GLUCOSE 134* 193*  --  181* 181* 112*  BUN 29* 14  --  19 26* 33*  CREATININE 3.29* 2.18*  --  3.01* 3.77* 4.59*  CALCIUM 8.5* 8.1*  --  8.5* 8.5* 8.5*  MG 2.7* 2.1  --  2.2 2.4 2.2  PHOS 3.0 3.7  --  3.6 4.3  4.3 4.6   GFR: Estimated Creatinine Clearance: 20.2 mL/min (A) (by C-G formula based on SCr of 4.59 mg/dL (H)). Recent Labs  Lab 04/24/2019 2327 04/20/19 0255  04/23/19 0358 04/23/19 1434 04/24/19 0325 04/25/19 0702  WBC  --   --    < > 9.0 14.0* 23.8* 12.8*  LATICACIDVEN 0.7 0.9  --   --   --   --   --    < > = values in this interval not displayed.    Liver Function Tests: Recent Labs  Lab 04/24/2019 0249  04/20/19 [id number]  04/23/19 0358 04/23/19 1434 04/24/19 0325 04/24/19 1630 04/25/19 0702  AST 25  --  46*  --   --  99*  --   --   --   ALT 14  --  16  --   --  34  --   --   --   ALKPHOS 254*  --  393*  --   --  654*  --   --   --   BILITOT 3.7*  --  3.7*  --   --  3.0*  --   --   --   PROT 6.3*  --  6.0*  --   --  6.3*  --   --   --   ALBUMIN 1.7*   < > 1.7*   < > 1.6* 1.7* 1.6* 1.5* 1.5*   < > = values in this interval not displayed.   No results for input(s): LIPASE, AMYLASE in the last 168 hours. Recent Labs  Lab 04/20/19 0255  AMMONIA 23    ABG    Component Value Date/Time   PHART 7.269 (L) 04/23/2019 1508   PCO2ART 61.5 (H) 04/23/2019 1508   PO2ART 347.0 (H) 04/23/2019 1508   HCO3 28.2 (H) 04/23/2019 1508   TCO2 30 04/23/2019 1508   ACIDBASEDEF 6.0 (H) 04/22/2019 0133   O2SAT 100.0 04/23/2019 1508     Coagulation Profile: Recent Labs  Lab 04/25/19 0702  INR 1.3*    Cardiac Enzymes: No results for input(s): CKTOTAL, CKMB, CKMBINDEX, TROPONINI in the last 168 hours.  HbA1C: Hgb A1c MFr Bld  Date/Time Value Ref Range Status  04/17/2019 03:20 AM 6.9 (H) 4.8 - 5.6 % Final    Comment:    (NOTE) Pre diabetes:           5.7%-6.4% Diabetes:              >6.4% Glycemic control for   <7.0% adults with diabetes   05/26/2018 04:57 AM 7.2 (H) 4.8 - 5.6 % Final    Comment:    (NOTE) Pre diabetes:          5.7%-6.4% Diabetes:              >6.4% Glycemic control for   <7.0% adults with diabetes     CBG: Recent Labs  Lab 04/24/19 1612 04/24/19 2038 04/25/19 0008 04/25/19 0523 04/25/19 0843  GLUCAP 171* 150* 148* 133* 95    This patient is critically ill with multiple organ system failure; which, requires frequent high complexity decision making, assessment, support, evaluation, and titration of therapies. This was completed through the application of advanced monitoring technologies and extensive interpretation of multiple databases. During this encounter critical care time was devoted to patient care services described in this note for 32 minutes.  Garner Nash, DO [facility] 04/25/2019 8:51 AM  Personal pager: [phone] If unanswered, please page CCM On-call: [phone]

## 2019-04-25 NOTE — Progress Notes (Signed)
Patient transported to CT and back without complications.  

## 2019-04-25 NOTE — Progress Notes (Signed)
Spoke with patients family. Explained to family that because of the PEA arrest that pt had Friday,  today would be the last day that there could be more than one visitor. Starting tomorrow just one dedicated visitor would be allowed to come visit pt at this time.

## 2019-04-25 NOTE — Procedures (Signed)
Interventional Radiology Procedure:   Indications: Bacteremia, sepsis and looking for infection source.  Fluid in right iliopsoas bursa on recent MRI.  Procedure: CT guided drain placement in right iliopsoas abscess  Findings: Fluid in right iliopsoas bursa.  Aspirated 5 ml of yellow purulent fluid and placed 10 Fr drain.  Complications: None     EBL: less than 10 ml  Plan: Send fluid for culture.  Follow drain output, anticipate minimal output based on small size of the collection.     Carla Compton R. Anselm Pancoast, MD  Pager: [phone]

## 2019-04-25 NOTE — Progress Notes (Addendum)
[facility] ROUNDING NOTE   Subjective:   This is a 38 year old female with a history of end-stage renal disease history of DVT diabetes hypertension rheumatoid arthritis and MRSA bacteremia of unclear etiology.  She developed transient hypotension requiring low-dose pressors and was transferred to the intensive care unit.  Patient decompensated with  Shock and bradycardia 04/20/2019.  Hyperkalemia initiation of CRRT 04/20/2019 until 04/22/2019.   Patient tolerated hemodialysis 04/23/2019 with 2 L removed.  She continues to have diarrhea stools.  Blood pressure 115 over 60s pulse 87 temperature 99 O2 sats 100% 40% FiO2  IV norepinephrine  Sodium 140 potassium 3.7 chloride 101 CO2 26 BUN 33 creatinine 4.59 calcium 8.5 phosphorus 4.6 magnesium 2.2 WBC 12.8 hemoglobin 12.7 0.2 platelets 139  Allopurinol 100 mg twice daily aspirin 81 mg daily Calcitrol 0.5 mcg Monday Wednesday Friday  darbepoetin 60 mcg q. Tuesday, multivitamins 1 daily Crestor 40 mg daily Renvela 800 mg with meals  Objective:  Vital signs in last 24 hours:  Temp:  [98.4 F (36.9 C)-100 F (37.8 C)] 99 F (37.2 C) (11/01 0819) Pulse Rate:  [86-103] 87 (11/01 0944) Resp:  [0-23] 11 (11/01 0944) BP: (92-125)/(41-72) 115/66 (11/01 0944) SpO2:  [71 %-100 %] 100 % (11/01 0944) FiO2 (%):  [40 %] 40 % (11/01 0800)  Weight change:  Filed Weights   04/22/19 0339 04/23/19 0705 04/24/19 0359  Weight: 115.1 kg 116.8 kg 113.7 kg    Intake/Output: I/O last 3 completed shifts: In: 1971.4 [I.V.:1701.4; NG/GT:270] Out: 550 [Emesis/NG output:550]   Intake/Output this shift:  Total I/O In: 11.2 [I.V.:11.2] Out: 0   General:  AAOx3 NAD HEENT: MMM [place] AT anicteric sclera Neck:  No JVD, no adenopathy CV:  Heart RRR  Lungs:  L/S CTA bilaterally Abd:  abd SNT/ND with normal BS GU:  Bladder non-palpable Extremities:  No LE edema. Skin:  No skin rash   Basic Metabolic Panel: Recent Labs  Lab 04/23/19 0358  04/23/19 1434 04/23/19 1508 04/24/19 0325 04/24/19 1630 04/25/19 0702  NA 137 137 137 137 139 140  K 4.2 3.5 3.6 3.6 3.4* 3.7  CL 102 101  --  99 99 101  CO2 20* 25  --  _0 GLUCOSE 134* 193*  --  181* 181* 112*  BUN 29* 14  --  19 26* 33*  CREATININE 3.29* 2.18*  --  3.01* 3.77* 4.59*  CALCIUM 8.5* 8.1*  --  8.5* 8.5* 8.5*  MG 2.7* 2.1  --  2.2 2.4 2.2  PHOS 3.0 3.7  --  3.6 4.3  4.3 4.6    Liver Function Tests: Recent Labs  Lab 04/05/2019 0249  04/20/19 0639  04/23/19 0358 04/23/19 1434 04/24/19 0325 04/24/19 1630 04/25/19 0702  AST 25  --  46*  --   --  99*  --   --   --   ALT 14  --  16  --   --  34  --   --   --   ALKPHOS 254*  --  393*  --   --  654*  --   --   --   BILITOT 3.7*  --  3.7*  --   --  3.0*  --   --   --   PROT 6.3*  --  6.0*  --   --  6.3*  --   --   --   ALBUMIN 1.7*   < > 1.7*   < > 1.6* 1.7*  1.6* 1.5* 1.5*   < > = values in this interval not displayed.   No results for input(s): LIPASE, AMYLASE in the last 168 hours. Recent Labs  Lab 04/20/19 0255  AMMONIA 23    CBC: Recent Labs  Lab 04/22/19 0341 04/23/19 0358 04/23/19 1434 04/23/19 1508 04/24/19 0325 04/25/19 0702  WBC 10.6* 9.0 14.0*  --  23.8* 12.8*  HGB 10.0* 8.6* 8.6* 9.9* 8.4* 7.2*  HCT 32.0* 28.1* 28.1* 29.0* 27.4* 24.2*  MCV 96.1 96.2 97.9  --  96.5 99.2  PLT 223 193 206  --  208 139*    Cardiac Enzymes: No results for input(s): CKTOTAL, CKMB, CKMBINDEX, TROPONINI in the last 168 hours.  BNP: Invalid input(s): POCBNP  CBG: Recent Labs  Lab 04/24/19 1612 04/24/19 2038 04/25/19 0008 04/25/19 0523 04/25/19 0843  GLUCAP 171* 150* 148* 133* 95    Microbiology: Results for orders placed or performed during the hospital encounter of 03/27/2019  SARS CORONAVIRUS 2 (TAT 6-24 HRS) Nasopharyngeal Nasopharyngeal Swab     Status: None   Collection Time: 04/17/19 12:27 AM   Specimen: Nasopharyngeal Swab  Result Value Ref Range Status   SARS Coronavirus 2 NEGATIVE  NEGATIVE Final    Comment: (NOTE) SARS-CoV-2 target nucleic acids are NOT DETECTED. The SARS-CoV-2 RNA is generally detectable in upper and lower respiratory specimens during the acute phase of infection. Negative results do not preclude SARS-CoV-2 infection, do not rule out co-infections with other pathogens, and should not be used as the sole basis for treatment or other patient management decisions. Negative results must be combined with clinical observations, patient history, and epidemiological information. The expected result is Negative. Fact Sheet for Patients: SugarRoll.be Fact Sheet for Healthcare Providers: https://www.woods-mathews.com/ This test is not yet approved or cleared by the [place] FDA and  has been authorized for detection and/or diagnosis of SARS-CoV-2 by FDA under an Emergency Use Authorization (EUA). This EUA will remain  in effect (meaning this test can be used) for the duration of the COVID-19 declaration under Section 56 4(b)(1) of the Act, 21 U.S.C. section 360bbb-3(b)(1), unless the authorization is terminated or revoked sooner. Performed at [facility], [place] [address], [place], [place] [address]   Culture, blood (routine x 2)     Status: None   Collection Time: 04/18/19  8:29 AM   Specimen: BLOOD RIGHT HAND  Result Value Ref Range Status   Specimen Description BLOOD RIGHT HAND  Final   Special Requests   Final    AEROBIC BOTTLE ONLY Blood Culture results may not be optimal due to an inadequate volume of blood received in culture bottles   Culture   Final    NO GROWTH 5 DAYS Performed at [facility], [place] [address] [place], [place] [address]    Report Status 04/23/2019 FINAL  Final  Culture, blood (routine x 2)     Status: None   Collection Time: 04/18/19  5:41 PM   Specimen: BLOOD RIGHT HAND  Result Value Ref Range Status   Specimen Description BLOOD RIGHT HAND  Final   Special  Requests   Final    AEROBIC BOTTLE ONLY Blood Culture results may not be optimal due to an inadequate volume of blood received in culture bottles   Culture   Final    NO GROWTH 5 DAYS Performed at [facility], [place] [address] [place], [place] [address]    Report Status 04/23/2019 FINAL  Final  MRSA PCR  Screening     Status: Abnormal   Collection Time: 04/10/2019  9:26 AM   Specimen: Nasopharyngeal  Result Value Ref Range Status   MRSA by PCR POSITIVE (A) NEGATIVE Final    Comment:        The GeneXpert MRSA Assay (FDA approved for NASAL specimens only), is one component of a comprehensive MRSA colonization surveillance program. It is not intended to diagnose MRSA infection nor to guide or monitor treatment for MRSA infections. RESULT CALLED TO, READ BACK BY AND VERIFIED WITH: Jefferson Fuel RN 12:05 04/03/2019 ([email or web address]) Performed at [facility], [place] [address] [place], [place] [address]     Coagulation Studies: Recent Labs    04/25/19 0702  LABPROT 15.8*  INR 1.3*    Urinalysis: No results for input(s): COLORURINE, LABSPEC, PHURINE, GLUCOSEU, HGBUR, BILIRUBINUR, KETONESUR, PROTEINUR, UROBILINOGEN, NITRITE, LEUKOCYTESUR in the last 72 hours.  Invalid input(s): APPERANCEUR    Imaging: Dg Chest 1 View  Result Date: 04/23/2019 CLINICAL DATA:  Hypotension, CPR EXAM: CHEST  1 VIEW COMPARISON:  04/20/2019 FINDINGS: Interval placement of endotracheal tube with distal tip terminating at the level of the carina. Enteric tube courses below the diaphragm with distal tip beyond the inferior margin of the film. Stable cardiomegaly. Ill-defined interstitial opacities bilaterally, improved compared to prior. No pleural effusion or pneumothorax. No acute osseous findings. IMPRESSION: 1. Endotracheal tube terminating at the level of the carina. Recommend retraction approximately 3 cm. 2. Slight interval improvement of bilateral interstitial opacities. These results will be called to  the ordering clinician or representative by the Radiologist Assistant, and communication documented in the PACS or zVision Dashboard. Electronically Signed   By: Davina Poke M.D.   On: 04/23/2019 14:03   Dg Abd 1 View  Result Date: 04/23/2019 CLINICAL DATA:  Orogastric tube insertion EXAM: ABDOMEN - 1 VIEW COMPARISON:  06/11/2018 FINDINGS: A limited view of the upper abdomen was obtained for the purposes of enteric tube localization. An enteric tube is identified coursing below the diaphragm with distal tip and side port terminating in the expected location of the gastric body. Numerous overlying leads and devices are present. No gross free intraperitoneal air on limited view. IMPRESSION: Orogastric tube appropriately positioned within the expected location of the gastric body. Electronically Signed   By: Davina Poke M.D.   On: 04/23/2019 14:00   Mr Brain Wo Contrast  Result Date: 04/23/2019 CLINICAL DATA:  Altered mental status. End-stage renal disease. Bacteremia. EXAM: MRI HEAD WITHOUT CONTRAST TECHNIQUE: Multiplanar, multiecho pulse sequences of the brain and surrounding structures were obtained without intravenous contrast. COMPARISON:  Head CT 04/17/2019 FINDINGS: Brain: No abnormality of the brainstem or cerebellum. There are foci of abnormal restricted diffusion and T2 signal within the corpus callosum, affecting the genu on the left and much of the body. The differential diagnosis includes post viral demyelination, toxic demyelination, multiple sclerosis and neuromyelitis optica. Autoimmune encephalopathy/vasculopathy ([place] syndrome) is possible. Occasionally, posterior reversible encephalopathy can involve only the corpus callosum. Cytotoxic lesions of the corpus callosum can occur subsequent to various immune mediated responses. Elsewhere, the cerebral hemispheres appear normal. No evidence of hemorrhage, hydrocephalus or extra-axial collection. Vascular: Major vessels at the base of  the brain show flow. Skull and upper cervical spine: Negative Sinuses/Orbits: Mucosal inflammatory changes of the paranasal sinuses as seen previously. Orbits negative. 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 Broad differential diagnosis as above. Electronically Signed  By: Nelson Chimes M.D.   On: 04/23/2019 12:47   Mr Hip Right Wo Contrast  Result Date: 04/23/2019 CLINICAL DATA:  MRSA bacteremia of unknown source. EXAM: MR OF THE RIGHT HIP WITHOUT CONTRAST TECHNIQUE: Multiplanar, multisequence MR imaging was performed. No intravenous contrast was administered. COMPARISON:  CT scan dated 04/20/2019 FINDINGS: Bones: There is minimal edema in the superior posterolateral aspect of the right femoral head, likely degenerative. No other significant bone abnormality of the hips or pelvis. Chronic low signal intensity from the bones is felt to be related to the patient's chronic renal disease. Articular cartilage and labrum Articular cartilage: Slight diffuse thinning of the articular cartilage, with more thinning posteriorly than elsewhere in the joint. Labrum:  Intact. Joint or bursal effusion Joint effusion:  Small joint effusion, similar to the opposite hip. Bursae: There is prominent right iliopsoas bursitis with fluid extending along the iliopsoas tendon anterior to the right hip. No greater trochanteric bursitis. Muscles and tendons Muscles and tendons: There is slight patchy edema in most of the muscles around the right hip. There is slight edema in the left gluteal muscles. No abscesses. There is edema in the subcutaneous fat adjacent to the catheter in the left femoral vein. This could be due to hemorrhage from the catheter insertion or cellulitis. Other findings Miscellaneous:   Bilateral slight inguinal adenopathy, nonspecific. IMPRESSION: 1. Prominent right iliopsoas bursitis. 2. Edema in the subcutaneous fat  adjacent to the catheter in the left femoral vein. This could be due to hemorrhage from the catheter insertion or cellulitis. 3. Slight arthritic changes of the right hip. 4. Slight nonspecific edema in the muscles of the right hip and buttock. Electronically Signed   By: Lorriane Shire M.D.   On: 04/23/2019 13:20     Medications:   . sodium chloride    . sodium chloride    . sodium chloride    . norepinephrine (LEVOPHED) Adult infusion 3 mcg/min (04/25/19 0800)  . vancomycin Stopped (04/23/19 1546)   . allopurinol  100 mg Oral BID  . aspirin  81 mg Per Tube Daily  . calcitRIOL  0.5 mcg Oral Q M,W,F-HD  . chlorhexidine gluconate (MEDLINE KIT)  15 mL Mouth Rinse BID  . Chlorhexidine Gluconate Cloth  6 each Topical Daily  . Chlorhexidine Gluconate Cloth  6 each Topical Q0600  . darbepoetin (ARANESP) injection - DIALYSIS  60 mcg Intravenous Q Tue-HD  . feeding supplement (PRO-STAT SUGAR FREE 64)  30 mL Per Tube Daily  . fentaNYL      . gabapentin  300 mg Oral TID  . insulin aspart  0-9 Units Subcutaneous Q4H  . lidocaine      . mouth rinse  15 mL Mouth Rinse 10 times per day  . midazolam      . multivitamin  1 tablet Oral QHS  . mupirocin ointment   Nasal BID  . rosuvastatin  40 mg Oral QHS  . sevelamer carbonate  800 mg Oral TID WC   sodium chloride, sodium chloride, acetaminophen **OR** acetaminophen, alteplase, alteplase, fentaNYL (SUBLIMAZE) injection, haloperidol lactate, heparin, lidocaine (PF), lidocaine-prilocaine, ondansetron **OR** ondansetron (ZOFRAN) IV, pentafluoroprop-tetrafluoroeth, traMADol  Assessment/ Plan:  1. MRSA bacteremia- persistent without obvious source  - ID consulted and TEE negative for vegetations. She did have a prior MRSA hand wound 07/2018.  Unclear etiology continues on vancomycin 4 weeks therapy recommended by infectious disease appreciate assistance 2. ESRD- MWF -[facility] -hypotension and shock initiated CRRT 04/20/2019 until  04/22/2019.  Tolerated  intermittent hemodialysis 04/23/2019 with removal of 2 L.  Next dialysis per 04/26/2019. 3. Dialysis access functioning AV fistula left arm with thrill and bruit.  She also has a right femoral dialysis cath that this will need to be removed. 4. Hypertension/volume-CRRT has been discontinued 04/22/2019 intermittent hemodialysis began 04/23/2019 5. Anemia- hgb 10.5>9.9 Started Aranesp 60 Tuesday  6. Metabolic bone disease- Continue calcitriol with HD.    Renvela added as binder  7. Nutrition - alb low - renal carb mod diet - add protein suppl 8. Transient confusion and agitation during prior acute admission prior to transfer to [facility] 9. DM/gout/GERD -meds per primary 10. Rheumatoid arthritis -stable not issue at this time 10.Recently diagnosed RLE DVT 04/11/19 - heparin IV for now 11.Elevated T. Bili - present at transfer     LOS: [place] _0 _1 :08 AM

## 2019-04-25 NOTE — Consult Note (Signed)
Chief Complaint: Patient was seen in consultation today for right iliopsoas bursa aspiration/drain placement at the request of Dr Mosie Lukes   Supervising Physician: Markus Daft  Patient Status: [facility] - In-pt  History of Present Illness: Carla Compton is a 38 y.o. female   ESRD DVT- On Xarelto Tx from [facility] for MRSA bacteremia Sepsis  Unclear source CT head neg; TEE neg CXR nonspecific BP trending down; confusion  MR 10/30: IMPRESSION: 1. Prominent right iliopsoas bursitis. 2. Edema in the subcutaneous fat adjacent to the catheter in the left femoral vein. This could be due to hemorrhage from the catheter insertion or cellulitis. 3. Slight arthritic changes of the right hip. 4. Slight nonspecific edema in the muscles of the right hip and buttock.  Request for bursa collection aspiration Imaging reviewed by Dr Vangie Bicker procedure     Past Medical History:  Diagnosis Date   Anemia    2019   Benign essential HTN    CHF (congestive heart failure) (HCC)    CKD (chronic kidney disease) stage 3, GFR 30-59 ml/min    Diabetes [facility])     Past Surgical History:  Procedure Laterality Date   BREAST SURGERY     reduction, 2005   RENAL BIOPSY  2018   TEE WITHOUT CARDIOVERSION N/A 03/25/2019   Procedure: TRANSESOPHAGEAL ECHOCARDIOGRAM (TEE);  Surgeon: Acie Fredrickson Wonda Cheng, MD;  Location: [facility] ENDOSCOPY;  Service: Cardiovascular;  Laterality: N/A;    Allergies: Contrast media [iodinated diagnostic agents] and Pepto-bismol [bismuth]  Medications: Prior to Admission medications   Medication Sig Start Date End Date Taking? Authorizing Provider  albuterol (PROAIR HFA) 108 (90 Base) MCG/ACT inhaler Inhale 2 puffs into the lungs every 4 (four) hours as needed for wheezing or shortness of breath.    [provider]  allopurinol (ZYLOPRIM) 100 MG tablet TAKE 1 TABLET BY MOUTH TWICE A DAY Patient taking differently: Take 100 mg by mouth 2 (two) times  daily.  04/12/19   Newt Minion, MD  aspirin EC 81 MG EC tablet Take 1 tablet (81 mg total) by mouth daily. 06/15/18   Kayleen Memos, DO  calcitRIOL (ROCALTROL) 0.25 MCG capsule Take 1 capsule (0.25 mcg total) by mouth every Monday, Wednesday, and Friday. 08/17/18   Bonnell Public, MD  carvedilol (COREG) 25 MG tablet Take 1 tablet (25 mg total) by mouth 2 (two) times daily with a meal. 06/14/18   Kayleen Memos, DO  cloNIDine (CATAPRES) 0.2 MG tablet Take 0.2 mg by mouth at bedtime. 03/18/19   [provider]  ferric citrate (AURYXIA) 1 GM 210 MG(Fe) tablet Take 210 mg by mouth 3 (three) times daily with meals.    [provider]  gabapentin (NEURONTIN) 300 MG capsule Take 300 mg by mouth every 8 (eight) hours. 04/10/19   [provider]  hydrALAZINE (APRESOLINE) 50 MG tablet Take 1 tablet (50 mg total) by mouth 3 (three) times daily. 06/23/18   Kroeger, Lorelee Cover., PA-C  Insulin Glargine (LANTUS SOLOSTAR) 100 UNIT/ML Solostar Pen Inject 20 Units into the skin daily.    [provider]  insulin lispro (HUMALOG KWIKPEN) 100 UNIT/ML KwikPen Inject 10 Units into the skin 3 (three) times daily.    [provider]  levofloxacin (LEVAQUIN) 750 MG tablet Take 750 mg by mouth every other day. 02/05/19   [provider]  metolazone (ZAROXOLYN) 2.5 MG tablet Take 2.5 mg by mouth once a week. 03/14/19   [provider]  oxyCODONE-acetaminophen (PERCOCET/ROXICET)  5-325 MG tablet Take 1 tablet by mouth every 4 (four) hours as needed (pain).  04/10/19   [provider]  rosuvastatin (CRESTOR) 40 MG tablet Take 40 mg by mouth at bedtime. 03/23/18   [provider]  torsemide (DEMADEX) 100 MG tablet Take 1 tablet (100 mg total) by mouth 2 (two) times daily. 08/16/18   Bonnell Public, MD     Family History  Problem Relation Age of Onset   Mitral valve prolapse Mother    Hypertension Mother    Hypertension Father     Diabetes Father    Diabetes Maternal Grandmother     Social History   Socioeconomic History   Marital status: Single    Spouse name: Not on file   Number of children: Not on file   Years of education: Not on file   Highest education level: Not on file  Occupational History   Not on file  Social Needs   Financial resource strain: Not on file   Food insecurity    Worry: Not on file    Inability: Not on file   Transportation needs    Medical: Not on file    Non-medical: Not on file  Tobacco Use   Smoking status: Never Smoker   Smokeless tobacco: Never Used  Substance and Sexual Activity   Alcohol use: Never    Frequency: Never   Drug use: Never   Sexual activity: Not Currently  Lifestyle   Physical activity    Days per week: Not on file    Minutes per session: Not on file   Stress: Not on file  Relationships   Social connections    Talks on phone: Not on file    Gets together: Not on file    Attends religious service: Not on file    Active member of club or organization: Not on file    Attends meetings of clubs or organizations: Not on file    Relationship status: Not on file  Other Topics Concern   Not on file  Social History Narrative   Not on file     Review of Systems: A 12 point ROS discussed and pertinent positives are indicated in the HPI above.  All other systems are negative.   Vital Signs: BP (!) 106/58 (BP Location: Right Leg)    Pulse 89    Temp 98.4 F (36.9 C)    Resp (!) 22    Ht 5\' 2"  (1.575 m)    Wt 250 lb 10.6 oz (113.7 kg)    LMP 03/31/2019    SpO2 98%    BMI 45.85 kg/m   Physical Exam Vitals signs reviewed.  Constitutional:      Comments: Vent; no response  Skin:    General: Skin is warm and dry.  Psychiatric:     Comments: Spoke to Mother Carla Compton via phone for consent     Imaging: Dg Chest 1 View  Result Date: 04/23/2019 CLINICAL DATA:  Hypotension, CPR EXAM: CHEST  1 VIEW COMPARISON:  04/20/2019  FINDINGS: Interval placement of endotracheal tube with distal tip terminating at the level of the carina. Enteric tube courses below the diaphragm with distal tip beyond the inferior margin of the film. Stable cardiomegaly. Ill-defined interstitial opacities bilaterally, improved compared to prior. No pleural effusion or pneumothorax. No acute osseous findings. IMPRESSION: 1. Endotracheal tube terminating at the level of the carina. Recommend retraction approximately 3 cm. 2. Slight interval improvement of bilateral interstitial opacities. These  results will be called to the ordering clinician or representative by the Radiologist Assistant, and communication documented in the PACS or zVision Dashboard. Electronically Signed   By: Davina Poke M.D.   On: 04/23/2019 14:03   Dg Abd 1 View  Result Date: 04/23/2019 CLINICAL DATA:  Orogastric tube insertion EXAM: ABDOMEN - 1 VIEW COMPARISON:  06/11/2018 FINDINGS: A limited view of the upper abdomen was obtained for the purposes of enteric tube localization. An enteric tube is identified coursing below the diaphragm with distal tip and side port terminating in the expected location of the gastric body. Numerous overlying leads and devices are present. No gross free intraperitoneal air on limited view. IMPRESSION: Orogastric tube appropriately positioned within the expected location of the gastric body. Electronically Signed   By: Davina Poke M.D.   On: 04/23/2019 14:00   Ct Head Wo Contrast  Result Date: 04/17/2019 CLINICAL DATA:  Altered level of consciousness. EXAM: CT HEAD WITHOUT CONTRAST TECHNIQUE: Contiguous axial images were obtained from the base of the skull through the vertex without intravenous contrast. COMPARISON:  None. FINDINGS: Brain: No evidence of acute infarction, hemorrhage, hydrocephalus, extra-axial collection or mass lesion/mass effect. Vascular: None Skull: Normal. Negative for fracture or focal lesion. Sinuses/Orbits: There is  diffuse mucosal thickening involving the sphenoid, maxillary and frontal sinuses as well as the ethmoid air cells. No air-fluid levels. Other: None IMPRESSION: 1. No acute intracranial abnormalities. 2. Chronic pan sinus inflammation. Electronically Signed   By: Kerby Moors M.D.   On: 04/17/2019 06:06   Mr Brain Wo Contrast  Result Date: 04/23/2019 CLINICAL DATA:  Altered mental status. End-stage renal disease. Bacteremia. EXAM: MRI HEAD WITHOUT CONTRAST TECHNIQUE: Multiplanar, multiecho pulse sequences of the brain and surrounding structures were obtained without intravenous contrast. COMPARISON:  Head CT 04/17/2019 FINDINGS: Brain: No abnormality of the brainstem or cerebellum. There are foci of abnormal restricted diffusion and T2 signal within the corpus callosum, affecting the genu on the left and much of the body. The differential diagnosis includes post viral demyelination, toxic demyelination, multiple sclerosis and neuromyelitis optica. Autoimmune encephalopathy/vasculopathy ([place] syndrome) is possible. Occasionally, posterior reversible encephalopathy can involve only the corpus callosum. Cytotoxic lesions of the corpus callosum can occur subsequent to various immune mediated responses. Elsewhere, the cerebral hemispheres appear normal. No evidence of hemorrhage, hydrocephalus or extra-axial collection. Vascular: Major vessels at the base of the brain show flow. Skull and upper cervical spine: Negative Sinuses/Orbits: Mucosal inflammatory changes of the paranasal sinuses as seen previously. Orbits negative. 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 Broad differential diagnosis as above. Electronically Signed   By: Nelson Chimes M.D.   On: 04/23/2019 12:47   Mr Hip Right Wo Contrast  Result Date: 04/23/2019 CLINICAL DATA:  MRSA bacteremia of unknown source. EXAM: MR OF THE RIGHT HIP WITHOUT CONTRAST  TECHNIQUE: Multiplanar, multisequence MR imaging was performed. No intravenous contrast was administered. COMPARISON:  CT scan dated 04/20/2019 FINDINGS: Bones: There is minimal edema in the superior posterolateral aspect of the right femoral head, likely degenerative. No other significant bone abnormality of the hips or pelvis. Chronic low signal intensity from the bones is felt to be related to the patient's chronic renal disease. Articular cartilage and labrum Articular cartilage: Slight diffuse thinning of the articular cartilage, with more thinning posteriorly than elsewhere in the joint. Labrum:  Intact. Joint or bursal effusion Joint effusion:  Small joint effusion, similar to the opposite  hip. Bursae: There is prominent right iliopsoas bursitis with fluid extending along the iliopsoas tendon anterior to the right hip. No greater trochanteric bursitis. Muscles and tendons Muscles and tendons: There is slight patchy edema in most of the muscles around the right hip. There is slight edema in the left gluteal muscles. No abscesses. There is edema in the subcutaneous fat adjacent to the catheter in the left femoral vein. This could be due to hemorrhage from the catheter insertion or cellulitis. Other findings Miscellaneous:   Bilateral slight inguinal adenopathy, nonspecific. IMPRESSION: 1. Prominent right iliopsoas bursitis. 2. Edema in the subcutaneous fat adjacent to the catheter in the left femoral vein. This could be due to hemorrhage from the catheter insertion or cellulitis. 3. Slight arthritic changes of the right hip. 4. Slight nonspecific edema in the muscles of the right hip and buttock. Electronically Signed   By: Lorriane Shire M.D.   On: 04/23/2019 13:20   Dg Chest Port 1 View  Result Date: 04/20/2019 CLINICAL DATA:  Hypoxia EXAM: PORTABLE CHEST 1 VIEW COMPARISON:  04/17/2019 FINDINGS: Cardiac shadow is enlarged but stable. Right jugular catheter has been removed in the interval. The  inspiratory effort is poor with patchy parenchymal opacities within both lungs but worst in the right upper lobe consistent with multifocal pneumonia. No bony abnormality is noted. IMPRESSION: Patchy multifocal opacities consistent with pneumonia. Electronically Signed   By: Inez Catalina M.D.   On: 04/20/2019 01:10   Portable Chest 1 View  Result Date: 04/17/2019 CLINICAL DATA:  38 year old female with fever. EXAM: PORTABLE CHEST 1 VIEW COMPARISON:  Chest radiograph dated 08/12/2018 FINDINGS: Right IJ central venous line with tip over right atrium close to the cavoatrial junction. There is mild cardiomegaly with mild vascular congestion. Scattered nodularity may represent congestion although atypical infection is not excluded. Clinical correlation is recommended. No focal consolidation, pleural effusion, or pneumothorax. No acute osseous pathology. IMPRESSION: 1. Cardiomegaly with mild vascular congestion. Pneumonia is not excluded. Clinical correlation is recommended. 2. Right IJ central venous line with tip over right atrium. Electronically Signed   By: Anner Crete M.D.   On: 04/17/2019 01:05   Ct Extremity Lower Right W Contrast  Result Date: 04/20/2019 CLINICAL DATA:  Shock with right hip pain. EXAM: CT OF THE LOWER RIGHT EXTREMITY WITH CONTRAST TECHNIQUE: Multidetector CT imaging of the lower right extremity was performed according to the standard protocol following intravenous contrast administration. COMPARISON:  Right foot MRI 06/08/2018. CT of the right thigh to April 13, 2015 CONTRAST:  69mL OMNIPAQUE IOHEXOL 300 MG/ML  SOLN FINDINGS: Bones/Joint/Cartilage No erosion or fracture seen at the hip. Well-circumscribed subchondral cystic change at the knee with joint space narrowing, chronic appearing and attributed to osteoarthritis. Multiple generalized central erosions at the Lisfranc joint and at the great toe interphalangeal joint, also seen on comparison MRI when midfoot erosions were  attributed to infection. This could also be neurogenic arthropathy, although no progression or dislocation. HD related amyloid arthropathy is also considered given relative stability. No visible hip joint effusion.  Small knee joint effusion. Ligaments Suboptimally assessed by CT. Muscles and Tendons No visible pyomyositis or clear inter fascial edema. 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 Soft tissues Subcutaneous edema at the plantar foot, calf, MRI lateral thigh. No soft tissue gas. Low-density rounded collection like appearance measuring 2 cm below the fifth MTP joint with limited peripheral enhancement, stable from 2019 and likely pressure related.  No adjacent ulceration is seen. Atherosclerotic calcification, premature. IMPRESSION: 1. Areas of subcutaneous edema in the lateral thigh, calf, and foot which could reflect mild cellulitis. 2. Lisfranc and great toe interphalangeal central erosions with plantar collection beneath the fifth MTP joint, chronic when compared to 2019 foot MRI. 3. 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 No pyomyositis or other deep space infection identified. 4. 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 Electronically Signed   By: Monte Fantasia M.D.   On: 04/20/2019 04:18    Labs:  CBC: Recent Labs    04/23/19 0358 04/23/19 1434 04/23/19 1508 04/24/19 0325 04/25/19 0702  WBC 9.0 14.0*  --  23.8* 12.8*  HGB 8.6* 8.6* 9.9* 8.4* 7.2*  HCT 28.1* 28.1* 29.0* 27.4* 24.2*  PLT 193 206  --  208 139*    COAGS: Recent Labs    04/21/19 0528 04/21/19 2309 04/22/19 0910 04/23/19 0358 04/25/19 0702  INR  --   --   --   --  1.3*  APTT >200* >200* 191* 174*  --     BMP: Recent Labs    04/23/19 1434 04/23/19 1508 04/24/19 0325 04/24/19 1630 04/25/19 0702  NA 137 137 137 139 140  K 3.5 3.6 3.6 3.4* 3.7  CL 101  --  99 99 101  CO2 25  --  24 27 26   GLUCOSE 193*  --   181* 181* 112*  BUN 14  --  19 26* 33*  CALCIUM 8.1*  --  8.5* 8.5* 8.5*  CREATININE 2.18*  --  3.01* 3.77* 4.59*  GFRNONAA 28*  --  19* 15* 11*  GFRAA 33*  --  22* 17* 13*    LIVER FUNCTION TESTS: Recent Labs    04/17/19 0320 04/05/2019 0249  04/20/19 0639  04/23/19 1434 04/24/19 0325 04/24/19 1630 04/25/19 0702  BILITOT 3.7* 3.7*  --  3.7*  --  3.0*  --   --   --   AST 16 25  --  46*  --  99*  --   --   --   ALT 15 14  --  16  --  34  --   --   --   ALKPHOS 145* 254*  --  393*  --  654*  --   --   --   PROT 6.3* 6.3*  --  6.0*  --  6.3*  --   --   --   ALBUMIN 1.7* 1.7*   < > 1.7*   < > 1.7* 1.6* 1.5* 1.5*   < > = values in this interval not displayed.    TUMOR MARKERS: No results for input(s): AFPTM, CEA, CA199, CHROMGRNA in the last 8760 hours.  Assessment and Plan:  Bacteremia Sepsis Unknown source Scheduled for Rt iliopsoas bursa abscess aspiration/drain placement in IR today Risks and benefits of Rt iliopsoas bursa abscess aspiration was discussed with the patient's mother Carla Compton and sister via phone.  Risks  And benefits including, but not limited to bleeding, infection, damage to adjacent structures or low yield requiring additional tests.  All of the questions were answered and there is agreement to proceed.  Consent signed and in chart.   Thank you for this interesting consult.  I greatly enjoyed meeting Euda Creque and look forward to participating in their care.  A copy of this report was sent to the requesting provider on this date.  Electronically Signed: Lavonia Drafts, PA-C 04/25/2019, 8:43 AM  I spent a total of 40 Minutes    in face to face in clinical consultation, greater than 50% of which was counseling/coordinating care for Rt iliopsoas abscess aspiration/drain

## 2019-04-25 DEATH — deceased

## 2019-04-26 ENCOUNTER — Inpatient Hospital Stay (HOSPITAL_COMMUNITY): Payer: BLUE CROSS/BLUE SHIELD

## 2019-04-26 DIAGNOSIS — M7989 Other specified soft tissue disorders: Secondary | ICD-10-CM

## 2019-04-26 DIAGNOSIS — M79609 Pain in unspecified limb: Secondary | ICD-10-CM

## 2019-04-26 LAB — RENAL FUNCTION PANEL
Albumin: 1.4 g/dL — ABNORMAL LOW (ref 3.5–5.0)
Albumin: 1.4 g/dL — ABNORMAL LOW (ref 3.5–5.0)
Albumin: 1.5 g/dL — ABNORMAL LOW (ref 3.5–5.0)
Anion gap: 14 (ref 5–15)
Anion gap: 14 (ref 5–15)
Anion gap: 10 (ref 5–15)
BUN: 43 mg/dL — ABNORMAL HIGH (ref 6–20)
BUN: 44 mg/dL — ABNORMAL HIGH (ref 6–20)
BUN: 19 mg/dL (ref 6–20)
CO2: 24 mmol/L (ref 22–32)
CO2: 26 mmol/L (ref 22–32)
CO2: 28 mmol/L (ref 22–32)
Calcium: 8.2 mg/dL — ABNORMAL LOW (ref 8.9–10.3)
Calcium: 8.3 mg/dL — ABNORMAL LOW (ref 8.9–10.3)
Calcium: 8.2 mg/dL — ABNORMAL LOW (ref 8.9–10.3)
Chloride: 101 mmol/L (ref 98–111)
Chloride: 98 mmol/L (ref 98–111)
Chloride: 102 mmol/L (ref 98–111)
Creatinine, Ser: 5.74 mg/dL — ABNORMAL HIGH (ref 0.44–1.00)
Creatinine, Ser: 5.87 mg/dL — ABNORMAL HIGH (ref 0.44–1.00)
Creatinine, Ser: 2.97 mg/dL — ABNORMAL HIGH (ref 0.44–1.00)
GFR calc Af Amer: 10 mL/min — ABNORMAL LOW (ref >60)
GFR calc Af Amer: 10 mL/min — ABNORMAL LOW (ref >60)
GFR calc Af Amer: 23 mL/min — ABNORMAL LOW (ref >60)
GFR calc non Af Amer: 9 mL/min — ABNORMAL LOW (ref >60)
GFR calc non Af Amer: 9 mL/min — ABNORMAL LOW (ref >60)
GFR calc non Af Amer: 19 mL/min — ABNORMAL LOW (ref >60)
Glucose, Bld: 83 mg/dL (ref 70–99)
Glucose, Bld: 81 mg/dL (ref 70–99)
Glucose, Bld: 103 mg/dL — ABNORMAL HIGH (ref 70–99)
Phosphorus: 5.5 mg/dL — ABNORMAL HIGH (ref 2.5–4.6)
Phosphorus: 5.5 mg/dL — ABNORMAL HIGH (ref 2.5–4.6)
Phosphorus: 3.4 mg/dL (ref 2.5–4.6)
Potassium: 3.9 mmol/L (ref 3.5–5.1)
Potassium: 3.8 mmol/L (ref 3.5–5.1)
Potassium: 3.3 mmol/L — ABNORMAL LOW (ref 3.5–5.1)
Sodium: 139 mmol/L (ref 135–145)
Sodium: 138 mmol/L (ref 135–145)
Sodium: 140 mmol/L (ref 135–145)

## 2019-04-26 LAB — CBC
HCT: 22.8 % — ABNORMAL LOW (ref 36.0–46.0)
HCT: 23.2 % — ABNORMAL LOW (ref 36.0–46.0)
Hemoglobin: 6.7 g/dL — CL (ref 12.0–15.0)
Hemoglobin: 6.8 g/dL — CL (ref 12.0–15.0)
MCH: 29.8 pg (ref 26.0–34.0)
MCH: 29.6 pg (ref 26.0–34.0)
MCHC: 29.4 g/dL — ABNORMAL LOW (ref 30.0–36.0)
MCHC: 29.3 g/dL — ABNORMAL LOW (ref 30.0–36.0)
MCV: 101.3 fL — ABNORMAL HIGH (ref 80.0–100.0)
MCV: 100.9 fL — ABNORMAL HIGH (ref 80.0–100.0)
Platelets: 136 10*3/uL — ABNORMAL LOW (ref 150–400)
Platelets: 131 10*3/uL — ABNORMAL LOW (ref 150–400)
RBC: 2.25 MIL/uL — ABNORMAL LOW (ref 3.87–5.11)
RBC: 2.3 MIL/uL — ABNORMAL LOW (ref 3.87–5.11)
RDW: 17 % — ABNORMAL HIGH (ref 11.5–15.5)
RDW: 17 % — ABNORMAL HIGH (ref 11.5–15.5)
WBC: 10.5 10*3/uL (ref 4.0–10.5)
WBC: 10.2 10*3/uL (ref 4.0–10.5)
nRBC: 0 % (ref 0.0–0.2)
nRBC: 0 % (ref 0.0–0.2)

## 2019-04-26 LAB — VANCOMYCIN, RANDOM: Vancomycin Rm: 30

## 2019-04-26 LAB — GLUCOSE, CAPILLARY
Glucose-Capillary: 103 mg/dL — ABNORMAL HIGH (ref 70–99)
Glucose-Capillary: 84 mg/dL (ref 70–99)
Glucose-Capillary: 76 mg/dL (ref 70–99)
Glucose-Capillary: 101 mg/dL — ABNORMAL HIGH (ref 70–99)
Glucose-Capillary: 95 mg/dL (ref 70–99)
Glucose-Capillary: 72 mg/dL (ref 70–99)

## 2019-04-26 LAB — MAGNESIUM
Magnesium: 2.2 mg/dL (ref 1.7–2.4)
Magnesium: 2 mg/dL (ref 1.7–2.4)

## 2019-04-26 LAB — PREPARE RBC (CROSSMATCH)

## 2019-04-26 LAB — HEMOGLOBIN AND HEMATOCRIT, BLOOD
HCT: 26.7 % — ABNORMAL LOW (ref 36.0–46.0)
Hemoglobin: 8.1 g/dL — ABNORMAL LOW (ref 12.0–15.0)

## 2019-04-26 MED ORDER — ROSUVASTATIN CALCIUM 20 MG PO TABS
40.0000 mg | ORAL_TABLET | Freq: Every day | ORAL | Status: DC
  Administered 2019-04-27 – 2019-04-30 (×4): 40 mg
  Filled 2019-04-26: qty 8
  Filled 2019-04-26: qty 2
  Filled 2019-04-26: qty 8
  Filled 2019-04-26 (×3): qty 2

## 2019-04-26 MED ORDER — SODIUM CHLORIDE 0.9 % IV SOLN: 100.0000 mL | INTRAVENOUS | Status: DC | PRN

## 2019-04-26 MED ORDER — ACETAMINOPHEN 650 MG RE SUPP
650.0000 mg | Freq: Four times a day (QID) | RECTAL | Status: DC | PRN
  Filled 2019-04-26: qty 1

## 2019-04-26 MED ORDER — ONDANSETRON HCL 4 MG/2ML IJ SOLN: 4.0000 mg | Freq: Four times a day (QID) | INTRAMUSCULAR | Status: DC | PRN

## 2019-04-26 MED ORDER — MIDODRINE HCL 5 MG PO TABS
5.0000 mg | ORAL_TABLET | Freq: Three times a day (TID) | ORAL | Status: DC
  Administered 2019-04-26: 5 mg
  Filled 2019-04-26: qty 1

## 2019-04-26 MED ORDER — SEVELAMER CARBONATE 800 MG PO TABS
800.0000 mg | ORAL_TABLET | Freq: Three times a day (TID) | ORAL | Status: DC
  Administered 2019-04-27 – 2019-04-29 (×7): 800 mg
  Filled 2019-04-26 (×8): qty 1

## 2019-04-26 MED ORDER — ACETAMINOPHEN 325 MG PO TABS
650.0000 mg | ORAL_TABLET | Freq: Four times a day (QID) | ORAL | Status: DC | PRN
  Administered 2019-04-30 – 2019-05-01 (×2): 650 mg
  Filled 2019-04-26 (×2): qty 2

## 2019-04-26 MED ORDER — ALLOPURINOL 100 MG PO TABS
100.0000 mg | ORAL_TABLET | Freq: Two times a day (BID) | ORAL | Status: DC
  Administered 2019-04-27 – 2019-04-30 (×8): 100 mg
  Filled 2019-04-26 (×10): qty 1

## 2019-04-26 MED ORDER — MIDODRINE HCL 5 MG PO TABS
10.0000 mg | ORAL_TABLET | Freq: Three times a day (TID) | ORAL | Status: DC
  Administered 2019-04-26 – 2019-04-28 (×5): 10 mg
  Filled 2019-04-26 (×5): qty 2

## 2019-04-26 MED ORDER — HEPARIN SODIUM (PORCINE) 5000 UNIT/ML IJ SOLN
5000.0000 [IU] | Freq: Three times a day (TID) | INTRAMUSCULAR | Status: DC
  Administered 2019-04-26 – 2019-04-30 (×12): 5000 [IU] via SUBCUTANEOUS
  Filled 2019-04-26 (×12): qty 1

## 2019-04-26 MED ORDER — LIDOCAINE HCL (PF) 1 % IJ SOLN: 5.0000 mL | INTRAMUSCULAR | Status: DC | PRN

## 2019-04-26 MED ORDER — VANCOMYCIN HCL IN DEXTROSE 1-5 GM/200ML-% IV SOLN
1000.0000 mg | INTRAVENOUS | Status: DC
  Administered 2019-04-28 – 2019-04-30 (×2): 1000 mg via INTRAVENOUS
  Filled 2019-04-26 (×2): qty 200

## 2019-04-26 MED ORDER — PENTAFLUOROPROP-TETRAFLUOROETH EX AERO: 1.0000 "application " | INHALATION_SPRAY | CUTANEOUS | Status: DC | PRN

## 2019-04-26 MED ORDER — ALTEPLASE 2 MG IJ SOLR: 2.0000 mg | Freq: Once | INTRAMUSCULAR | Status: DC | PRN

## 2019-04-26 MED ORDER — SODIUM CHLORIDE 0.9% IV SOLUTION: Freq: Once | INTRAVENOUS | Status: DC

## 2019-04-26 MED ORDER — VITAL HIGH PROTEIN PO LIQD
1000.0000 mL | ORAL | Status: DC
  Administered 2019-04-26 – 2019-04-30 (×4): 1000 mL

## 2019-04-26 MED ORDER — LIDOCAINE-PRILOCAINE 2.5-2.5 % EX CREA
1.0000 "application " | TOPICAL_CREAM | CUTANEOUS | Status: DC | PRN
  Filled 2019-04-26: qty 5

## 2019-04-26 MED ORDER — CALCITRIOL 0.5 MCG PO CAPS
0.5000 ug | ORAL_CAPSULE | ORAL | Status: DC
  Administered 2019-04-28 – 2019-04-30 (×2): 0.5 ug
  Filled 2019-04-26: qty 2
  Filled 2019-04-26: qty 1

## 2019-04-26 MED ORDER — ONDANSETRON HCL 4 MG PO TABS: 4.0000 mg | ORAL_TABLET | Freq: Four times a day (QID) | ORAL | Status: DC | PRN

## 2019-04-26 MED ORDER — HEPARIN SODIUM (PORCINE) 1000 UNIT/ML DIALYSIS: 1000.0000 [IU] | INTRAMUSCULAR | Status: DC | PRN

## 2019-04-26 NOTE — Progress Notes (Signed)
[facility] NEPHROLOGY PROGRESS NOTE  Assessment/ Plan: Pt is a 38 y.o. 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  Patient decompensated with shock and bradycardia on 10/27 required CRRT from 10/27-10/29.  #MRSA bacteremia: Seen by ID.  TEE negative for vegetation.  On IV vancomycin for total 4 weeks.  # ESRD: MWF at [facility], [place].  Tolerated IHD on 10/30 with 2 L UF.  Currently doing intermittent hemodialysis, required low-dose pressors to maintain blood pressure.  AV fistula is working well.  Recommend to discontinue temporary groin catheter.  Discussed with ICU team.  Plan to continue MWF dialysis schedule.  #Septic shock: On low-dose Levophed.  Increase midodrine to 10 mg 3 times daily.  Antibiotics as above.  Monitor blood pressure.  #Anemia due to sepsis, ESRD: Plan for PRBC transfusion today.  Continue ESA.  No iron because of sepsis.  #Metabolic bone disease: Phosphorus level acceptable.  Continue Renvela and calcitriol.  #Status post PEA arrest on 10/30: Echo with 55 to 60% EF.  Per primary team.  #Toxic metabolic encephalopathy: Patient is not awakening.  Minimize sedatives.  Subjective: Seen and examined in ICU.  Patient remains lethargic.  Blood pressure soft requiring low-dose Levophed for dialysis. Objective Vital signs in last 24 hours: Vitals:   04/26/19 1115 04/26/19 1130 04/26/19 1139 04/26/19 1156  BP: (!) 95/52 (!) 92/50 (!) 107/59   Pulse: 76 76    Resp: 18 17    Temp:   98.4 F (36.9 C) 98.4 F (36.9 C)  TempSrc:   Oral Axillary  SpO2: 100% 100%    Weight:   110.8 kg   Height:       Weight change:   Intake/Output Summary (Last 24 hours) at 04/26/2019 1201 Last data filed at 04/26/2019 1139 Gross per 24 hour  Intake 144.79 ml  Output 2247 ml  Net -2102.21 ml       Labs: Basic Metabolic Panel: Recent  Labs  Lab 04/25/19 1755 04/26/19 0540 04/26/19 0752  NA 140 139 138  K 3.7 3.9 3.8  CL 101 101 98  CO2 '27 24 26  ' GLUCOSE 126* 83 81  BUN 38* 43* 44*  CREATININE 5.09* 5.74* 5.87*  CALCIUM 8.6* 8.2* 8.3*  PHOS 5.4* 5.5* 5.5*   Liver Function Tests: Recent Labs  Lab 04/20/19 0639  04/23/19 1434  04/25/19 1755 04/26/19 0540 04/26/19 0752  AST 46*  --  99*  --   --   --   --   ALT 16  --  34  --   --   --   --   ALKPHOS 393*  --  654*  --   --   --   --   BILITOT 3.7*  --  3.0*  --   --   --   --   PROT 6.0*  --  6.3*  --   --   --   --   ALBUMIN 1.7*   < > 1.7*   < > 1.5* 1.4* 1.4*   < > = values in this interval not displayed.   No results for input(s): LIPASE, AMYLASE in the last 168 hours. Recent Labs  Lab 04/20/19 0255  AMMONIA 23   CBC: Recent Labs  Lab 04/23/19 1434  04/24/19 0325 04/25/19 0702 04/26/19 0540 04/26/19 0752  WBC 14.0*  --  23.8* 12.8* 10.5 10.2  HGB 8.6*   < > 8.4* 7.2* 6.7* 6.8*  HCT 28.1*   < > 27.4* 24.2* 22.8* 23.2*  MCV 97.9  --  96.5 99.2 101.3* 100.9*  PLT 206  --  208 139* 136* 131*   < > = values in this interval not displayed.   Cardiac Enzymes: No results for input(s): CKTOTAL, CKMB, CKMBINDEX, TROPONINI in the last 168 hours. CBG: Recent Labs  Lab 04/25/19 2031 04/26/19 0108 04/26/19 0532 04/26/19 0743 04/26/19 1136  GLUCAP 115* 103* 84 76 101*    Iron Studies: No results for input(s): IRON, TIBC, TRANSFERRIN, FERRITIN in the last 72 hours. Studies/Results: Ct Image Guided Fluid Drain By Catheter  Result Date: 04/25/2019 INDICATION: 38 year old with bacteremia and looking for infection source. Patient has fluid in the right iliopsoas bursa. Plan for image guided aspiration or drainage of the right iliopsoas fluid collection. EXAM: CT-GUIDED DRAIN PLACEMENT IN RIGHT ILIOPSOAS BURSA MEDICATIONS: No antibiotics given for this procedure. ANESTHESIA/SEDATION: Fentanyl 50 mcg COMPLICATIONS: None immediate. PROCEDURE:  Informed consent was obtained for a CT-guided aspiration or drainage. Patient was placed supine on the CT scanner and images through the pelvis were obtained. Right iliopsoas bursa was identified. Overlying skin was prepped with chlorhexidine and sterile field was created. 18 gauge trocar needle was directed into the fluid collection with CT guidance. 5 mL yellow purulent fluid was removed. Stiff Amplatz wire was advanced into the collection and the tract was dilated to accommodate a 10.2 [place] multipurpose drain. Small amount of additional bloody fluid was removed from the collection. Catheter was sutured to skin and attached to a suction bulb. FINDINGS: Fluid in the right iliopsoas bursa. Yellow purulent fluid was removed from the bursa. Bursa was partially decompressed following drain placement. IMPRESSION: CT-guided drain placement within the right iliopsoas bursa abscess. Electronically Signed   By: Markus Daft M.D.   On: 04/25/2019 21:02    Medications: Infusions: . sodium chloride    . sodium chloride    . sodium chloride    . norepinephrine (LEVOPHED) Adult infusion 5 mcg/min (04/26/19 0904)  . [START ON 04/28/2019] vancomycin      Scheduled Medications: . sodium chloride   Intravenous Once  . allopurinol  100 mg Oral BID  . aspirin  81 mg Per Tube Daily  . calcitRIOL  0.5 mcg Oral Q M,W,F-HD  . chlorhexidine gluconate (MEDLINE KIT)  15 mL Mouth Rinse BID  . Chlorhexidine Gluconate Cloth  6 each Topical Daily  . Chlorhexidine Gluconate Cloth  6 each Topical Q0600  . darbepoetin (ARANESP) injection - DIALYSIS  60 mcg Intravenous Q Tue-HD  . feeding supplement (PRO-STAT SUGAR FREE 64)  30 mL Per Tube Daily  . heparin injection (subcutaneous)  5,000 Units Subcutaneous Q8H  . insulin aspart  0-9 Units Subcutaneous Q4H  . mouth rinse  15 mL Mouth Rinse 10 times per day  . midodrine  5 mg Per Tube TID WC  . multivitamin  1 tablet Oral QHS  . mupirocin ointment   Nasal BID  . pantoprazole  (PROTONIX) IV  40 mg Intravenous Daily  . rosuvastatin  40 mg Oral QHS  . sevelamer carbonate  800 mg Oral TID WC  . sodium chloride flush  5 mL Intracatheter Q8H    have reviewed scheduled and prn medications.  Physical Exam: General: Intubated, sedated, not responding Heart:RRR, s1s2 nl Lungs: Coarse breath sound bilateral Abdomen:soft, Non-tender, non-distended Extremities: edema+ Dialysis Access: Left upper extremity AV fistula has good thrill and bruit,  able to access.  Right groin temporary catheter.  Osie Bond Bartt Gonzaga 04/26/2019,12:01 PM  LOS: 10 days  Pager: [phone]

## 2019-04-26 NOTE — Progress Notes (Signed)
Nutrition Follow-up  DOCUMENTATION CODES:   Morbid obesity  INTERVENTION:   Tube Feeding:  Vital High Protein at 20 ml/hr, goal rate of 65 ml/hr Goal rate provides 1560 kcals, 137 g of protein and 1310 mL of free water Meets 100% estimated calorie and protein needs at goal   NUTRITION DIAGNOSIS:   Increased nutrient needs related to acute illness as evidenced by estimated needs.  Being addressed via TF   GOAL:   Patient will meet greater than or equal to 90% of their needs  Progressing  MONITOR:   Vent status, Skin, Weight trends, Labs, I & O's  REASON FOR ASSESSMENT:   Rounds    ASSESSMENT:   38 yo female admitted with critically ill due to septic shock from MRSA bacteremia, encephalopathy AKI requiring CRRT. PMH includes ESRD on HD, DVT, DM, HTN, RA, CHF, anemia  10/24 Admit 10/27 Transferred to ICU, CRRT initiated 10/29 CRRT discontinued 10/30 Cortrak placed-Gastric 10/30 Cardiorespiratory arrest, intubated  Patient is currently intubated on ventilator support MV: 8 L/min Temp (24hrs), Avg:98.3 F (36.8 C), Min:97.7 F (36.5 C), Max:98.6 F (37 C)  No TF since intubation, poor nutrition since admission. Cortrak placed 10/30 due to poor po intake but TF was never initiated   Abdomen soft, obese, BS present, +flatus  Current wt 110.8 kg post HD today. Admission weight 111.6 kg. Lowest wt since admission 110 kg. Utilizing as EDW  Labs: phosphorus 5.5 (H), CBGs 76-101, potassium wdl Meds: calcitriol, aransep, rena-vit, renvela with meals  Diet Order:   Diet Order            Diet NPO time specified  Diet effective midnight              EDUCATION NEEDS:   Not appropriate for education at this time  Skin:  Skin Assessment: Reviewed RN Assessment  Last BM:  10/30  Height:   Ht Readings from Last 1 Encounters:  04/04/2019 5\' 2"  (1.575 m)    Weight:   Wt Readings from Last 1 Encounters:  04/26/19 110.8 kg    Ideal Body Weight:  50  kg  BMI:  Body mass index is 44.68 kg/m.  Estimated Nutritional Needs:   Kcal:  [id number]  Protein:  125-135 grams  Fluid:  1000 ml plus UOP    [employer] MS, RDN, LDN, CNSC [phone] Pager  [phone] Weekend/On-Call Pager

## 2019-04-26 NOTE — Progress Notes (Signed)
Dr. Carolin Sicks saw pt. At bedside LFA AVF functioning properly. MD states theres no need for right femoral trialysis catheter from HD standpoint. Primary nurse Macy Mis, RN aware. ICU MD also aware.

## 2019-04-26 NOTE — Progress Notes (Signed)
Upper extremity venous has been completed.   Preliminary results in CV Proc.   Abram Sander 04/26/2019 4:09 PM

## 2019-04-26 NOTE — Progress Notes (Signed)
PT Cancellation Note  Patient Details Name: Carla Compton MRN: [id number] DOB: June 17, 1981   Cancelled Treatment:    Reason Eval/Treat Not Completed: (P) Patient not medically ready Pt went into cardiac arrest 10/30 and is currently intubated and obtunded. PT will follow back tomorrow to see if pt is more appropriate for therapy.  Janine Reller B. Migdalia Dk PT, DPT Acute Rehabilitation Services Pager [phone] Office [phone]    [facility] 04/26/2019, 11:59 AM

## 2019-04-26 NOTE — Progress Notes (Addendum)
NAME:  Carla Compton, MRN:  [id number], DOB:  14-Apr-1981, LOS: 51 ADMISSION DATE:  04/24/2019, CONSULTATION DATE:  04/26/2019 REFERRING MD:  Dr. Karleen Hampshire, CHIEF COMPLAINT:  Septic shock  History of present illness   Carla Compton is a 38 y.o. F with PMH of recently diagnosed RLE DVT on Xarelto, ESRD, Type 2 DM, Rheumatoid arthritis, HTN and HL who was initially admitted in [place] 10/19 for septic shock with 4/4 BC grew MRSA and she was transferred to [facility] on 10/23 for ID consult and further management.  admitted to ICU for septic shock. On 10/30 underwent PEA arrest  With ROSC after 5 minutes of CPR requiring emergent intubation.   Significant Hospital Events   Admitted 10/23 PEA arrest 10/30 CT guided drain on 11/1   Consults:  Nephrology ID IR Ortho  Procedures:  ETT 10/30>> trialysis 10/27>> CT guided drainage of Psoas abscess on 11/1  Significant Diagnostic Tests:  10/24 CT head>>No acute findings  10/24 CXR>>Cardiomegaly with mild vascular congestion. Pneumonia is not Excluded. 10/27 CT RLE>> possible right foot osteomyelitis 10/30MRI head>>no thromboembolic disease [id number]: Transesophageal echocardiogram no evidence of vegetation. 10/30 MRI hip with psoas abscess  Micro Data:  MRSA PCR positive 10/26 Blood cultures negative since admission 11/01 psoas abscess cultures negative  Antimicrobials:  On vancomycin per ID  Interim history/subjective:  Not following commands, seems more confused this morning.  Off pressors since last night  Objective   Blood pressure (!) 102/51, pulse 75, temperature 98.6 F (37 C), temperature source Oral, resp. rate 18, height 5\' 2"  (1.575 m), weight 112.6 kg, last menstrual period 03/31/2019, SpO2 100 %.    Vent Mode: PRVC FiO2 (%):  [40 %] 40 % Set Rate:  [18 bmp] 18 bmp Vt Set:  [400 mL] 400 mL PEEP:  [5 cmH20] 5 cmH20 Plateau Pressure:  [18 cmH20-25 cmH20] 19 cmH20   Intake/Output Summary (Last 24  hours) at 04/26/2019 0926 Last data filed at 04/26/2019 0555 Gross per 24 hour  Intake 178.51 ml  Output 6 ml  Net 172.51 ml   Filed Weights   04/24/19 0359 04/26/19 0500 04/26/19 0730  Weight: 113.7 kg 113.6 kg 112.6 kg    Examination: General: intubated, sedated HENT: Sclera anicteric.  Not tracking eye movements. Lungs: diminished, clear Cardiovascular: Regular rate and rhythm soft systolic murmur. Abdomen: Obese, distended, soft, normal bowel sounds Extremities: bilateral piting edema, Right groin dialysis catheter LUE Fistula Neuro: Withdrawing only to pain  Assessment & Plan:   Toxic Metabolic Encephalopathy -  intermittently awake per nursing report. This morning grimaces to pain/sternal rub.  - May be multifactorial secondary to medications and PEA arrest - Discontinue Neurontin.  Hold sedating agents as able  Acute hypoxemic respiratory failure - maintain LTVV, daily SBT - on minimal vent settings, mental status precluding extubation  ESRD - nephrology following, HD today through LUE fistula - ok to d/c trialysis per nephrology, will d/c if can stay off pressors  Septic/Circulatory Shock - secondary to MRSA bacteremia. Iliopsoas abscess drained 11/1 Vanc through Nov 23rd per ID. - Add Midodrine 5 TID   S/P PEA arrest -Awaiting neurologic recovery -Brain imaging negative for thromboembolic disease  Acute on chronic blood loss anemia  - likely acute illness superimposed with chronic disease - will give 1 unit prbc with dialysis  RUE swelling  -Progressing over the last several days.  Suspect thrombophlebitis. - will order upper extremity Doppler  Seropositive RA - on plaquenil at home? -Not on home prednisone  Best practice:  Diet: tube feeds held due to ileus previously.  Will start tube feeds trickle today Pain/Anxiety/Delirium protocol (if indicated): on fentanyl for pain control VAP protocol (if indicated): yes DVT prophylaxis: [place] adding GI  prophylaxis: PPI Glucose control: <180 Mobility: bed rest due to shock Code Status: Full Family Communication: We will update today Disposition: needs ICU  Labs   CBC: Recent Labs  Lab 04/23/19 1434 04/23/19 1508 04/24/19 0325 04/25/19 0702 04/26/19 0540 04/26/19 0752  WBC 14.0*  --  23.8* 12.8* 10.5 10.2  HGB 8.6* 9.9* 8.4* 7.2* 6.7* 6.8*  HCT 28.1* 29.0* 27.4* 24.2* 22.8* 23.2*  MCV 97.9  --  96.5 99.2 101.3* 100.9*  PLT 206  --  208 139* 136* 131*    Basic Metabolic Panel: Recent Labs  Lab 04/23/19 1434  04/24/19 0325 04/24/19 1630 04/25/19 0702 04/25/19 1755 04/26/19 0540 04/26/19 0752  NA 137   < > 137 139 140 140 139 138  K 3.5   < > 3.6 3.4* 3.7 3.7 3.9 3.8  CL 101  --  99 99 101 101 101 98  CO2 25  --  24 27 26 27 24 26   GLUCOSE 193*  --  181* 181* 112* 126* 83 81  BUN 14  --  19 26* 33* 38* 43* 44*  CREATININE 2.18*  --  3.01* 3.77* 4.59* 5.09* 5.74* 5.87*  CALCIUM 8.1*  --  8.5* 8.5* 8.5* 8.6* 8.2* 8.3*  MG 2.1  --  2.2 2.4 2.2  --  2.2  --   PHOS 3.7  --  3.6 4.3  4.3 4.6 5.4* 5.5* 5.5*   < > = values in this interval not displayed.   GFR: Estimated Creatinine Clearance: 15.7 mL/min (A) (by C-G formula based on SCr of 5.87 mg/dL (H)). Recent Labs  Lab 03/25/2019 2327 04/20/19 0255  04/24/19 0325 04/25/19 0702 04/26/19 0540 04/26/19 0752  WBC  --   --    < > 23.8* 12.8* 10.5 10.2  LATICACIDVEN 0.7 0.9  --   --   --   --   --    < > = values in this interval not displayed.    Liver Function Tests: Recent Labs  Lab 04/20/19 [id number]  04/23/19 1434  04/24/19 1630 04/25/19 0702 04/25/19 1755 04/26/19 0540 04/26/19 0752  AST 46*  --  99*  --   --   --   --   --   --   ALT 16  --  34  --   --   --   --   --   --   ALKPHOS 393*  --  654*  --   --   --   --   --   --   BILITOT 3.7*  --  3.0*  --   --   --   --   --   --   PROT 6.0*  --  6.3*  --   --   --   --   --   --   ALBUMIN 1.7*   < > 1.7*   < > 1.5* 1.5* 1.5* 1.4* 1.4*   < > = values in  this interval not displayed.   No results for input(s): LIPASE, AMYLASE in the last 168 hours. Recent Labs  Lab 04/20/19 0255  AMMONIA 23    ABG    Component Value Date/Time   PHART 7.269 (L) 04/23/2019 1508   PCO2ART 61.5 (H) 04/23/2019 1508  PO2ART 347.0 (H) 04/23/2019 1508   HCO3 28.2 (H) 04/23/2019 1508   TCO2 30 04/23/2019 1508   ACIDBASEDEF 6.0 (H) 04/22/2019 0133   O2SAT 100.0 04/23/2019 1508     Coagulation Profile: Recent Labs  Lab 04/25/19 0702  INR 1.3*    Cardiac Enzymes: No results for input(s): CKTOTAL, CKMB, CKMBINDEX, TROPONINI in the last 168 hours.  HbA1C: Hgb A1c MFr Bld  Date/Time Value Ref Range Status  04/17/2019 03:20 AM 6.9 (H) 4.8 - 5.6 % Final    Comment:    (NOTE) Pre diabetes:          5.7%-6.4% Diabetes:              >6.4% Glycemic control for   <7.0% adults with diabetes   05/26/2018 04:57 AM 7.2 (H) 4.8 - 5.6 % Final    Comment:    (NOTE) Pre diabetes:          5.7%-6.4% Diabetes:              >6.4% Glycemic control for   <7.0% adults with diabetes     CBG: Recent Labs  Lab 04/25/19 1615 04/25/19 2031 04/26/19 0108 04/26/19 0532 04/26/19 0743  GLUCAP 115* 115* 103* 84 76     Critical care time: 47 minutes    The patient is critically ill with multiple organ systems failure and requires high complexity decision making for assessment and support, frequent evaluation and titration of therapies, application of advanced monitoring technologies and extensive interpretation of multiple databases.   Critical Care Time devoted to patient care services described in this note is 47 minutes. This time reflects time of care of this [place] . This critical care time does not reflect separately billable procedures or procedure time, teaching time or supervisory time of PA/NP/Med student/Med Resident etc but could involve care discussion time.  Leone [facility] 04/26/2019  11:59 AM  Pager: [phone] After hours pager: [phone]

## 2019-04-26 NOTE — Progress Notes (Signed)
Awaiting blood product for administration. Primary nurse Macy Mis, RN states blood product not available at this time, will notify this nurse when available. 1 hour and 23min remaining treatment time.

## 2019-04-26 NOTE — Progress Notes (Signed)
Dr. Carolin Sicks notified unable to UF pt. Due to low bps with hemodialysis. Pt. Very edematous. Ok to restart Levophed for bp support. Primary nurse made aware.

## 2019-04-26 NOTE — Progress Notes (Signed)
Hold Vancomycin dose per pharmacist.

## 2019-04-26 NOTE — Progress Notes (Addendum)
Pharmacy Antibiotic Note  Carla Compton is a 38 y.o. female admitted on 04/05/2019 with persistent MRSA bacteremia. Repeat BCx negative. Hx of HD MWF, previously on CRRT this admission, stopped 10/29.  Vancomycin level this AM was high at 30.  Patient has returned to HD MWF schedule, tolerated full session today.   Plan: Hold vancomycin dose post-HD today Resume vancomycin 1g IV qHD MWF with next HD planned on 11/4 Monitor c/s, pre-HD vancomycin level as indicated F/u HD schedule/tolerance inpatient ID planning 4 weeks of treatment through 11/23   Height: 5\' 2"  (157.5 cm) Weight: 248 lb 3.8 oz (112.6 kg) IBW/kg (Calculated) : 50.1  Temp (24hrs), Avg:98.3 F (36.8 C), Min:97.7 F (36.5 C), Max:98.8 F (37.1 C)  Recent Labs  Lab 04/11/2019 2327 04/20/19 0255  04/23/19 1434 04/24/19 0325 04/24/19 1630 04/25/19 0702 04/25/19 1755 04/26/19 0540 04/26/19 0752  WBC  --   --    < > 14.0* 23.8*  --  12.8*  --  10.5 10.2  CREATININE  --   --    < > 2.18* 3.01* 3.77* 4.59* 5.09* 5.74* 5.87*  LATICACIDVEN 0.7 0.9  --   --   --   --   --   --   --   --   VANCORANDOM  --   --   --   --   --   --   --   --  30  --    < > = values in this interval not displayed.    Estimated Creatinine Clearance: 15.7 mL/min (A) (by C-G formula based on SCr of 5.87 mg/dL (H)).    Allergies  Allergen Reactions  . Contrast Media [Iodinated Diagnostic Agents] Other (See Comments)    Affected her kidneys  . Pepto-Bismol [Bismuth] Other (See Comments)    Unknown reaction   Elicia Lamp, PharmD, BCPS Clinical Pharmacist Please check AMION for all [facility] contact numbers 04/26/2019 1:14 PM

## 2019-04-27 ENCOUNTER — Inpatient Hospital Stay (HOSPITAL_COMMUNITY): Payer: BLUE CROSS/BLUE SHIELD

## 2019-04-27 LAB — MAGNESIUM
Magnesium: 2 mg/dL (ref 1.7–2.4)
Magnesium: 2.1 mg/dL (ref 1.7–2.4)

## 2019-04-27 LAB — CBC
HCT: 26.9 % — ABNORMAL LOW (ref 36.0–46.0)
Hemoglobin: 7.9 g/dL — ABNORMAL LOW (ref 12.0–15.0)
MCH: 29.4 pg (ref 26.0–34.0)
MCHC: 29.4 g/dL — ABNORMAL LOW (ref 30.0–36.0)
MCV: 100 fL (ref 80.0–100.0)
Platelets: 126 10*3/uL — ABNORMAL LOW (ref 150–400)
RBC: 2.69 MIL/uL — ABNORMAL LOW (ref 3.87–5.11)
RDW: 17.5 % — ABNORMAL HIGH (ref 11.5–15.5)
WBC: 7.8 10*3/uL (ref 4.0–10.5)
nRBC: 0 % (ref 0.0–0.2)

## 2019-04-27 LAB — RENAL FUNCTION PANEL
Albumin: 1.4 g/dL — ABNORMAL LOW (ref 3.5–5.0)
Albumin: 1.4 g/dL — ABNORMAL LOW (ref 3.5–5.0)
Anion gap: 11 (ref 5–15)
Anion gap: 14 (ref 5–15)
BUN: 23 mg/dL — ABNORMAL HIGH (ref 6–20)
BUN: 31 mg/dL — ABNORMAL HIGH (ref 6–20)
CO2: 27 mmol/L (ref 22–32)
CO2: 23 mmol/L (ref 22–32)
Calcium: 8.3 mg/dL — ABNORMAL LOW (ref 8.9–10.3)
Calcium: 8.1 mg/dL — ABNORMAL LOW (ref 8.9–10.3)
Chloride: 101 mmol/L (ref 98–111)
Chloride: 95 mmol/L — ABNORMAL LOW (ref 98–111)
Creatinine, Ser: 3.64 mg/dL — ABNORMAL HIGH (ref 0.44–1.00)
Creatinine, Ser: 4.31 mg/dL — ABNORMAL HIGH (ref 0.44–1.00)
GFR calc Af Amer: 18 mL/min — ABNORMAL LOW (ref >60)
GFR calc Af Amer: 14 mL/min — ABNORMAL LOW (ref >60)
GFR calc non Af Amer: 15 mL/min — ABNORMAL LOW (ref >60)
GFR calc non Af Amer: 12 mL/min — ABNORMAL LOW (ref >60)
Glucose, Bld: 97 mg/dL (ref 70–99)
Glucose, Bld: 159 mg/dL — ABNORMAL HIGH (ref 70–99)
Phosphorus: 4.1 mg/dL (ref 2.5–4.6)
Phosphorus: 3.9 mg/dL (ref 2.5–4.6)
Potassium: 3.5 mmol/L (ref 3.5–5.1)
Potassium: 3.6 mmol/L (ref 3.5–5.1)
Sodium: 139 mmol/L (ref 135–145)
Sodium: 132 mmol/L — ABNORMAL LOW (ref 135–145)

## 2019-04-27 LAB — GLUCOSE, CAPILLARY
Glucose-Capillary: 89 mg/dL (ref 70–99)
Glucose-Capillary: 93 mg/dL (ref 70–99)
Glucose-Capillary: 110 mg/dL — ABNORMAL HIGH (ref 70–99)
Glucose-Capillary: 104 mg/dL — ABNORMAL HIGH (ref 70–99)
Glucose-Capillary: 134 mg/dL — ABNORMAL HIGH (ref 70–99)
Glucose-Capillary: 140 mg/dL — ABNORMAL HIGH (ref 70–99)
Glucose-Capillary: 142 mg/dL — ABNORMAL HIGH (ref 70–99)

## 2019-04-27 LAB — PHOSPHORUS: Phosphorus: 4.1 mg/dL (ref 2.5–4.6)

## 2019-04-27 LAB — BPAM RBC
Blood Product Expiration Date: 202011242359
ISSUE DATE / TIME: 202011021401
Unit Type and Rh: 7300

## 2019-04-27 LAB — TYPE AND SCREEN
ABO/RH(D): AB POS
Antibody Screen: NEGATIVE
PT AG Type: NEGATIVE
Unit division: 0

## 2019-04-27 MED ORDER — CHLORHEXIDINE GLUCONATE CLOTH 2 % EX PADS
6.0000 | MEDICATED_PAD | Freq: Every day | CUTANEOUS | Status: DC
  Administered 2019-04-27 – 2019-05-01 (×3): 6 via TOPICAL

## 2019-04-27 NOTE — Progress Notes (Signed)
PIV consult: RUE edematous. R forearm 22g sluggish to flush. 20g placed in proximal RUE. Pt has minimal PIV options due to L a/v dialysis access.

## 2019-04-27 NOTE — Consult Note (Signed)
Neurology Consultation Reason for Consult: Encephalopathy Referring Physician: Dr. Lenice Llamas  CC: Enceohalopathy  History is obtained from: chart review  HPI: Akeila Yarosh is a 38 y.o. female  With ho rheumatoid arthritis on plaquenil, ESRD on HD MWF, DVT on Xarelto, DM and HTN who was transferred from outside hospital on 04/17/2019 for MRSA bacteremia.  She was noted to have a psoas abscess which has since been drained.  On 10/30 patient had a PEA arrest requiring CPR and emergent intubation ROSC was achieved after 5 minutes. Neurology was consulted due to persistent encephalopathy.  Of note, patient had MRI brain on 04/23/2019 prior to her having a PEA arrest.  That MRI showed restricted diffusion of the corpus callosum.   ROS: Unable to obtain due to altered mental status.   Past Medical History:  Diagnosis Date  . Anemia    2019  . Benign essential HTN   . CHF (congestive heart failure) ([place])   . CKD (chronic kidney disease) stage 3, GFR 30-59 ml/min   . Diabetes ([place])     Family History  Problem Relation Age of Onset  . Mitral valve prolapse Mother   . Hypertension Mother   . Hypertension Father   . Diabetes Father   . Diabetes Maternal Grandmother    Social History: Per chart review, she has never smoked. She has never used smokeless tobacco. She reports that she does not drink alcohol or use drugs.  Exam: Current vital signs: BP (!) 108/50   Pulse 81   Temp 97.8 F (36.6 C) (Axillary)   Resp 16   Ht 5\' 2"  (1.575 m)   Wt 110.3 kg   LMP 04/01/2019   SpO2 93%   BMI 44.48 kg/m  Vital signs in last 24 hours: Temp:  [97.6 F (36.4 C)-98.9 F (37.2 C)] 97.8 F (36.6 C) (11/03 1154) Pulse Rate:  [78-86] 81 (11/03 1230) Resp:  [10-27] 16 (11/03 1230) BP: (101-136)/(47-68) 108/50 (11/03 1215) SpO2:  [87 %-100 %] 93 % (11/03 1230) FiO2 (%):  [30 %-40 %] 30 % (11/03 1200) Weight:  [110.3 kg] 110.3 kg (11/03 0500)   Physical Exam  Constitutional: Appears  well-developed and well-nourished.  Psych: Unable to assess secondary to intubation, altered mental status eyes: No scleral injection HENT: No OP obstrucion Head: Normocephalic.  Cardiovascular: Normal rate and regular rhythm.  Respiratory: Intubated, on pressor support GI: Soft.  No distension. There is no tenderness.  Skin: WDI  Neuro: Mental Status: Opens eyes to verbal stimuli, does not follow commands Cranial Nerves: PERLA, oculocephalics intact, corneals intact,  Motor/sensory: Winces but does not withdraw to noxious stimuli in right upper and lower extremity and left upper extremity.  2/5 to noxious stimuli in left lower extremity.  Right lower extremity externally rotated. Deep Tendon Reflexes: 1+ and symmetric in the biceps and patellae.  Plantars: Toes are mute bilaterally.  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   Assessment and plan: 38 year old female with rheumatoid arthritis who was transferred from outside hospital and diagnosed with MRSA bacteremia, right psoas abscess status post drainage and PEA arrest with ROSC in 5 minutes on 04/23/2019.  Neurology was consulted due to persistent encephalopathy.  Encephalopathy MRSA  bacteremia End-stage renal disease with elevated BUN -Encephalopathy is likely multifactorial in the setting of bacteremia, cardiac arrest,elevated BUN -Other differential includes anoxic brain injury, stroke, subclinical seizures, rheumatoid vasculitis/meningo encephalitis.  Recommendations - MRI brain was obtained prior to PEA arrest.  Ideally would also like to get MRI Brain w and wo contrast to assess for  any evidence of meningitis/encephalitis but with history of end-stage renal disease MRI contrast is contraindicated. Therefore will obtain another MRI brain without contrast to look for any anoxic injury, stroke.   -Also considered ordering CTA head but patient has contrast allergy and will require prednisone.  After weighing risks and benefits of prednisone administration in the setting of bacteremia, decided to wait till MRI results.  If MRI brain shows evidence of strokes, will consider CTA head to look for stenosis, vasculitis. -If MRI brain does not show any acute abnormalities, may consider lumbar puncture to look for rheumatoid meningoencephalitis. -We will also consider long-term video EEG monitoring to look for subclinical seizures after MRI brain -Rest of the management per primary team   CRITICAL CARE Performed by: Lora Havens   Total critical care time: 35 minutes  Critical care time was exclusive of separately billable procedures and treating other patients.  Critical care was necessary to treat or prevent imminent or life-threatening deterioration.  Critical care was time spent personally by me on the following activities: development of treatment plan with patient and/or surrogate as well as nursing, discussions with consultants, evaluation of patient's response to treatment, examination of patient, obtaining history from patient or surrogate, ordering and performing treatments and interventions, ordering and review of laboratory studies, ordering and review of radiographic studies, pulse oximetry and re-evaluation of patient's condition.

## 2019-04-27 NOTE — Progress Notes (Signed)
NAME:  Carla Compton, MRN:  [id number], DOB:  02-07-1981, LOS: 41 ADMISSION DATE:  04/18/2019, CONSULTATION DATE:  04/27/2019 REFERRING MD:  Dr. Karleen Hampshire, CHIEF COMPLAINT:  Septic shock  History of present illness   Carla Compton is a 38 y.o. F with PMH of recently diagnosed RLE DVT on Xarelto, ESRD, Type 2 DM, Rheumatoid arthritis, HTN and HL who was initially admitted in [place] 10/19 for septic shock with 4/4 BC grew MRSA and she was transferred to [facility] on 10/23 for ID consult and further management.  admitted to ICU for septic shock. On 10/30 underwent PEA arrest  With ROSC after 5 minutes of CPR requiring emergent intubation. Has been persistently obtunded since arrest.   Significant Hospital Events   Admitted 10/23 PEA arrest 10/30 CT guided drain on 11/1   Consults:  Nephrology ID IR Ortho Neurology today.   Procedures:  ETT 10/30>> trialysis 10/27>> CT guided drainage of Psoas abscess on 11/1  Significant Diagnostic Tests:  10/24 CT head>>No acute findings  10/24 CXR>>Cardiomegaly with mild vascular congestion. Pneumonia is not Excluded. 10/27 CT RLE>> possible right foot osteomyelitis 10/30MRI head>>no thromboembolic disease [id number]: Transesophageal echocardiogram no evidence of vegetation. 10/30 MRI hip with psoas abscess  Micro Data:  MRSA PCR positive 10/26 Blood cultures negative since admission 11/01 psoas abscess cultures growing staph.   Antimicrobials:  On vancomycin per ID  Interim history/subjective:  Not on pressors, tolerating tube feed advancement. Opens eyes briefly to name - not consistent.   Objective   Blood pressure (!) 106/48, pulse 80, temperature 98.5 F (36.9 C), temperature source Oral, resp. rate 19, height 5\' 2"  (1.575 m), weight 110.3 kg, last menstrual period 04/03/2019, SpO2 93 %.    Vent Mode: PRVC FiO2 (%):  [30 %-40 %] 30 % Set Rate:  [18 bmp] 18 bmp Vt Set:  [400 mL] 400 mL PEEP:  [5 cmH20] 5 cmH20  Plateau Pressure:  [18 cmH20-20 cmH20] 19 cmH20   Intake/Output Summary (Last 24 hours) at 04/27/2019 0846 Last data filed at 04/27/2019 0800 Gross per 24 hour  Intake 835 ml  Output 2251 ml  Net -1416 ml   Filed Weights   04/26/19 0730 04/26/19 1139 04/27/19 0500  Weight: 112.6 kg 110.8 kg 110.3 kg    Examination: General: intubated, sedated HENT: Sclera anicteric.  Not tracking eye movements. Lungs: diminished, clear Cardiovascular: Regular rate and rhythm soft systolic murmur. Abdomen: Obese, distended, soft, normal bowel sounds Extremities: bilateral piting edema, Right groin dialysis catheter LUE Fistula Neuro: Withdrawing only to pain  Assessment & Plan:   Toxic Metabolic Encephalopathy -  intermittently awake per nursing report. This morning grimaces to pain/sternal rub.  - May be multifactorial secondary to medications and PEA arrest - Discontinued Neurontin.  Hold sedating agents as able - consult to neurology today.   Acute hypoxemic respiratory failure - maintain LTVV, daily SBT - on minimal vent settings, mental status precluding extubation  ESRD - nephrology following, HD today through LUE fistula - will d/c trialysis line today.   Septic/Circulatory Shock - secondary to MRSA bacteremia. Iliopsoas abscess drained 11/1 Vanc through Nov 23rd per ID. - improved with Midodrine 5 TID   S/P PEA arrest -Awaiting neurologic recovery -Brain imaging negative for thromboembolic disease - will consult neurology today given persistent decreased mental status  Acute on chronic blood loss anemia  - likely acute illness superimposed with chronic disease - given 1 unit prbc with dialysis  RUE swelling  - upper extremity Doppler negative  for DVT or superficial clot  Seropositive RA - on plaquenil at home? -Not on home prednisone  Best practice:  Diet: tube feeds held due to ileus previously.  Will start tube feeds trickle today Pain/Anxiety/Delirium protocol (if  indicated): on fentanyl for pain control VAP protocol (if indicated): yes DVT prophylaxis: [place] adding GI prophylaxis: PPI Glucose control: <180 Mobility: bed rest due to shock Code Status: Full Family Communication: We will update today Disposition: needs ICU  Labs   CBC: Recent Labs  Lab 04/24/19 0325 04/25/19 0702 04/26/19 0540 04/26/19 0752 04/26/19 1830 04/27/19 0314  WBC 23.8* 12.8* 10.5 10.2  --  7.8  HGB 8.4* 7.2* 6.7* 6.8* 8.1* 7.9*  HCT 27.4* 24.2* 22.8* 23.2* 26.7* 26.9*  MCV 96.5 99.2 101.3* 100.9*  --  100.0  PLT 208 139* 136* 131*  --  126*    Basic Metabolic Panel: Recent Labs  Lab 04/24/19 1630 04/25/19 0702 04/25/19 1755 04/26/19 0540 04/26/19 0752 04/26/19 1632 04/27/19 0314  NA 139 140 140 139 138 140 139  K 3.4* 3.7 3.7 3.9 3.8 3.3* 3.5  CL 99 101 101 101 98 102 101  CO2 27 26 27 24 26 28 27   GLUCOSE 181* 112* 126* 83 81 103* 97  BUN 26* 33* 38* 43* 44* 19 23*  CREATININE 3.77* 4.59* 5.09* 5.74* 5.87* 2.97* 3.64*  CALCIUM 8.5* 8.5* 8.6* 8.2* 8.3* 8.2* 8.3*  MG 2.4 2.2  --  2.2  --  2.0 2.0  PHOS 4.3  4.3 4.6 5.4* 5.5* 5.5* 3.4 4.1  4.1   GFR: Estimated Creatinine Clearance: 25 mL/min (A) (by C-G formula based on SCr of 3.64 mg/dL (H)). Recent Labs  Lab 04/25/19 0702 04/26/19 0540 04/26/19 0752 04/27/19 0314  WBC 12.8* 10.5 10.2 7.8    Liver Function Tests: Recent Labs  Lab 04/23/19 1434  04/25/19 1755 04/26/19 0540 04/26/19 0752 04/26/19 1632 04/27/19 0314  AST 99*  --   --   --   --   --   --   ALT 34  --   --   --   --   --   --   ALKPHOS 654*  --   --   --   --   --   --   BILITOT 3.0*  --   --   --   --   --   --   PROT 6.3*  --   --   --   --   --   --   ALBUMIN 1.7*   < > 1.5* 1.4* 1.4* 1.5* 1.4*   < > = values in this interval not displayed.   No results for input(s): LIPASE, AMYLASE in the last 168 hours. No results for input(s): AMMONIA in the last 168 hours.  ABG    Component Value Date/Time   PHART 7.269  (L) 04/23/2019 1508   PCO2ART 61.5 (H) 04/23/2019 1508   PO2ART 347.0 (H) 04/23/2019 1508   HCO3 28.2 (H) 04/23/2019 1508   TCO2 30 04/23/2019 1508   ACIDBASEDEF 6.0 (H) 04/22/2019 0133   O2SAT 100.0 04/23/2019 1508     Coagulation Profile: Recent Labs  Lab 04/25/19 0702  INR 1.3*    Cardiac Enzymes: No results for input(s): CKTOTAL, CKMB, CKMBINDEX, TROPONINI in the last 168 hours.  HbA1C: Hgb A1c MFr Bld  Date/Time Value Ref Range Status  04/17/2019 03:20 AM 6.9 (H) 4.8 - 5.6 % Final    Comment:    (NOTE) Pre  diabetes:          5.7%-6.4% Diabetes:              >6.4% Glycemic control for   <7.0% adults with diabetes   05/26/2018 04:57 AM 7.2 (H) 4.8 - 5.6 % Final    Comment:    (NOTE) Pre diabetes:          5.7%-6.4% Diabetes:              >6.4% Glycemic control for   <7.0% adults with diabetes     CBG: Recent Labs  Lab 04/26/19 1136 04/26/19 1557 04/26/19 1937 04/27/19 0331 04/27/19 0757  GLUCAP 101* 95 72 93 110*     Critical care time: 56 minutes    The patient is critically ill with multiple organ systems failure and requires high complexity decision making for assessment and support, frequent evaluation and titration of therapies, application of advanced monitoring technologies and extensive interpretation of multiple databases.   Critical Care Time devoted to patient care services described in this note is 56 minutes. This time reflects time of care of this Herman . This critical care time does not reflect separately billable procedures or procedure time, teaching time or supervisory time of PA/NP/Med student/Med Resident etc but could involve care discussion time.  Leone [facility] 04/27/2019 8:46 AM  Pager: [phone] After hours pager: [phone]

## 2019-04-27 NOTE — Progress Notes (Signed)
[facility] NEPHROLOGY PROGRESS NOTE  Assessment/ Plan: Pt is a 38 y.o. 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  Patient decompensated with shock and bradycardia on 10/27 required CRRT from 10/27-10/29.  #MRSA bacteremia: Seen by ID.  TEE negative for vegetation.  On IV vancomycin for total 4 weeks.  # ESRD: MWF at [facility], [place]. status post IHD yesterday with a 2.2 L UF, tolerated well.  Plan to continue her MWF schedule, next HD tomorrow.  AV fistula is working well.  Recommend to discontinue temporary groin catheter.  Discussed with ICU team.    #Septic shock: On low-dose Levophed.  Increase midodrine to 10 mg 3 times daily.  Antibiotics as above.  Monitor blood pressure.  #Anemia due to sepsis, ESRD: Status post blood transfusion on 11/12.  Continue ESA.  No iron because of sepsis.  #Metabolic bone disease: Phosphorus level acceptable.  Continue Renvela and calcitriol.  #Status post PEA arrest on 10/30: Echo with 55 to 60% EF.  Per primary team.  #Toxic metabolic encephalopathy: Just opening eyes with the name, lethargic and not following commands.  Neurology was consulted.   Minimize sedatives.  Subjective: Seen and examined in ICU.  Patient remains lethargic.  Blood pressure soft requiring low-dose Levophed for dialysis.  No new event.  Discussed with ICU team. Objective Vital signs in last 24 hours: Vitals:   04/27/19 0830 04/27/19 0845 04/27/19 0900 04/27/19 0915  BP: 123/63 128/64 132/64 132/67  Pulse: 81 82 83 82  Resp: _0 Temp:      TempSrc:      SpO2: 95% 96% 95% 90%  Weight:      Height:       Weight change: -1 kg  Intake/Output Summary (Last 24 hours) at 04/27/2019 0920 Last data filed at 04/27/2019 0900 Gross per 24 hour  Intake 865 ml  Output 2251 ml  Net -1386 ml       Labs: Basic Metabolic  Panel: Recent Labs  Lab 04/26/19 0752 04/26/19 1632 04/27/19 0314  NA 138 140 139  K 3.8 3.3* 3.5  CL 98 102 101  CO2 _1 GLUCOSE 81 103* 97  BUN 44* 19 23*  CREATININE 5.87* 2.97* 3.64*  CALCIUM 8.3* 8.2* 8.3*  PHOS 5.5* 3.4 4.1  4.1   Liver Function Tests: Recent Labs  Lab 04/23/19 1434  04/26/19 0752 04/26/19 1632 04/27/19 0314  AST 99*  --   --   --   --   ALT 34  --   --   --   --   ALKPHOS 654*  --   --   --   --   BILITOT 3.0*  --   --   --   --   PROT 6.3*  --   --   --   --   ALBUMIN 1.7*   < > 1.4* 1.5* 1.4*   < > = values in this interval not displayed.   No results for input(s): LIPASE, AMYLASE in the last 168 hours. No results for input(s): AMMONIA in the last 168 hours. CBC: Recent Labs  Lab 04/24/19 0325 04/25/19 0702 04/26/19 0540 04/26/19 0752 04/26/19 1830 04/27/19 0314  WBC 23.8* 12.8* 10.5 10.2  --  7.8  HGB 8.4* 7.2* 6.7* 6.8* 8.1* 7.9*  HCT 27.4* 24.2* 22.8* 23.2* 26.7* 26.9*  MCV 96.5  99.2 101.3* 100.9*  --  100.0  PLT 208 139* 136* 131*  --  126*   Cardiac Enzymes: No results for input(s): CKTOTAL, CKMB, CKMBINDEX, TROPONINI in the last 168 hours. CBG: Recent Labs  Lab 04/26/19 1557 04/26/19 1937 04/26/19 2323 04/27/19 0331 04/27/19 0757  GLUCAP 95 72 89 93 110*    Iron Studies: No results for input(s): IRON, TIBC, TRANSFERRIN, FERRITIN in the last 72 hours. Studies/Results: Ct Image Guided Fluid Drain By Catheter  Result Date: 04/25/2019 INDICATION: 38 year old with bacteremia and looking for infection source. Patient has fluid in the right iliopsoas bursa. Plan for image guided aspiration or drainage of the right iliopsoas fluid collection. EXAM: CT-GUIDED DRAIN PLACEMENT IN RIGHT ILIOPSOAS BURSA MEDICATIONS: No antibiotics given for this procedure. ANESTHESIA/SEDATION: Fentanyl 50 mcg COMPLICATIONS: None immediate. PROCEDURE: Informed consent was obtained for a CT-guided aspiration or drainage. Patient was placed  supine on the CT scanner and images through the pelvis were obtained. Right iliopsoas bursa was identified. Overlying skin was prepped with chlorhexidine and sterile field was created. 18 gauge trocar needle was directed into the fluid collection with CT guidance. 5 mL yellow purulent fluid was removed. Stiff Amplatz wire was advanced into the collection and the tract was dilated to accommodate a 10.2 [place] multipurpose drain. Small amount of additional bloody fluid was removed from the collection. Catheter was sutured to skin and attached to a suction bulb. FINDINGS: Fluid in the right iliopsoas bursa. Yellow purulent fluid was removed from the bursa. Bursa was partially decompressed following drain placement. IMPRESSION: CT-guided drain placement within the right iliopsoas bursa abscess. Electronically Signed   By: Markus Daft M.D.   On: 04/25/2019 21:02   Vas [place] Upper Extremity Venous Duplex  Result Date: 04/26/2019 UPPER VENOUS STUDY  Indications: Pain, and Swelling Comparison Study: no prior Performing Technologist: Abram Sander RVS  Examination Guidelines: A complete evaluation includes B-mode imaging, spectral Doppler, color Doppler, and power Doppler as needed of all accessible portions of each vessel. Bilateral testing is considered an integral part of a complete examination. Limited examinations for reoccurring indications may be performed as noted.  Right Findings: +----------+------------+---------+-----------+----------+--------------+ RIGHT     CompressiblePhasicitySpontaneousProperties   Summary     +----------+------------+---------+-----------+----------+--------------+ IJV                                                 Not visualized +----------+------------+---------+-----------+----------+--------------+ Subclavian    Full                                                 +----------+------------+---------+-----------+----------+--------------+ Axillary      Full                                                  +----------+------------+---------+-----------+----------+--------------+ Brachial      Full                                                 +----------+------------+---------+-----------+----------+--------------+  Radial        Full                                                 +----------+------------+---------+-----------+----------+--------------+ Ulnar         Full                                                 +----------+------------+---------+-----------+----------+--------------+ Cephalic      Full                                                 +----------+------------+---------+-----------+----------+--------------+ Basilic       Full                                                 +----------+------------+---------+-----------+----------+--------------+  Summary:  Right: No evidence of deep vein thrombosis in the upper extremity. No evidence of superficial vein thrombosis in the upper extremity. No evidence of thrombosis in the subclavian.  *See table(s) above for measurements and observations.    Preliminary     Medications: Infusions: . sodium chloride    . sodium chloride    . sodium chloride    . feeding supplement (VITAL HIGH PROTEIN) 30 mL/hr at 04/27/19 0652  . [START ON 04/28/2019] vancomycin      Scheduled Medications: . sodium chloride   Intravenous Once  . allopurinol  100 mg Per Tube BID  . aspirin  81 mg Per Tube Daily  . [START ON 04/28/2019] calcitRIOL  0.5 mcg Per Tube Q M,W,F-HD  . chlorhexidine gluconate (MEDLINE KIT)  15 mL Mouth Rinse BID  . Chlorhexidine Gluconate Cloth  6 each Topical Daily  . Chlorhexidine Gluconate Cloth  6 each Topical Q0600  . darbepoetin (ARANESP) injection - DIALYSIS  60 mcg Intravenous Q Tue-HD  . heparin injection (subcutaneous)  5,000 Units Subcutaneous Q8H  . insulin aspart  0-9 Units Subcutaneous Q4H  . mouth rinse  15 mL Mouth Rinse 10 times per day   . midodrine  10 mg Per Tube TID WC  . multivitamin  1 tablet Oral QHS  . mupirocin ointment   Nasal BID  . pantoprazole (PROTONIX) IV  40 mg Intravenous Daily  . rosuvastatin  40 mg Per Tube QHS  . sevelamer carbonate  800 mg Per Tube TID WC  . sodium chloride flush  5 mL Intracatheter Q8H    have reviewed scheduled and prn medications.  Physical Exam: General: Intubated, opening eyes with name only.  Not following commands Heart:RRR, s1s2 nl Lungs: Coarse breath sound bilateral Abdomen:soft, Non-tender, non-distended Extremities: edema+ Dialysis Access: Left upper extremity AV fistula has good thrill and bruit, able to access.  Right groin temporary catheter.  Lener Ventresca Prasad Lisbeth Puller 04/27/2019,9:20 AM  LOS: 11 days  Pager: [phone]

## 2019-04-27 NOTE — Progress Notes (Signed)
Spoke w/ pts sister Gabriel Cirri to provide updates. [place] appreciative.

## 2019-04-27 NOTE — Procedures (Signed)
Patient Name: Carla Compton  MRN: [id number]  Epilepsy Attending: Lora Havens  Referring Physician/Provider: Dr. Amie [place] Date: 04/27/2019 Duration: 27.44 minutes  Patient history: 38 year old female status post PEA arrest.  EEG to evaluate for seizures.  Level of alertness: Comatose  AEDs during EEG study: None  Technical aspects: This EEG study was done with scalp electrodes positioned according to the 10-20 International system of electrode placement. Electrical activity was acquired at a sampling rate of 500Hz  and reviewed with a high frequency filter of 70Hz  and a low frequency filter of 1Hz . EEG data were recorded continuously and digitally stored.   Description: EEG showed continuous generalized polymorphic 2 to 5 Hz theta-delta slowing.  EEG was reactive to tactile stimuli.  Hyperventilation and photic stimulation were not performed.  Abnormality -Continuous slow, generalized  IMPRESSION: This study is suggestive of severe diffuse encephalopathy, nonspecific to etiology. No seizures or epileptiform discharges were seen throughout the recording.

## 2019-04-27 NOTE — Progress Notes (Signed)
EEG complete - results pending 

## 2019-04-27 NOTE — Progress Notes (Signed)
PT Cancellation Note  Patient Details Name: Carla Compton MRN: [id number] DOB: 1980-11-19   Cancelled Treatment:    Reason Eval/Treat Not Completed: (P) Patient not medically ready Pt continues to be obtunded after cardiac arrest last week. PT will follow back tomorrow to reassess medical readiness.  Krishiv Sandler B. Migdalia Dk PT, DPT Acute Rehabilitation Services Pager [phone] Office [phone]   [place] 04/27/2019, 8:42 AM

## 2019-04-28 ENCOUNTER — Inpatient Hospital Stay (HOSPITAL_COMMUNITY): Payer: BLUE CROSS/BLUE SHIELD

## 2019-04-28 DIAGNOSIS — G92 Toxic encephalopathy: Secondary | ICD-10-CM

## 2019-04-28 LAB — CBC
HCT: 27.2 % — ABNORMAL LOW (ref 36.0–46.0)
Hemoglobin: 8.4 g/dL — ABNORMAL LOW (ref 12.0–15.0)
MCH: 29.6 pg (ref 26.0–34.0)
MCHC: 30.9 g/dL (ref 30.0–36.0)
MCV: 95.8 fL (ref 80.0–100.0)
Platelets: 126 10*3/uL — ABNORMAL LOW (ref 150–400)
RBC: 2.84 MIL/uL — ABNORMAL LOW (ref 3.87–5.11)
RDW: 17.2 % — ABNORMAL HIGH (ref 11.5–15.5)
WBC: 8.3 10*3/uL (ref 4.0–10.5)
nRBC: 0 % (ref 0.0–0.2)

## 2019-04-28 LAB — RENAL FUNCTION PANEL
Albumin: 1.4 g/dL — ABNORMAL LOW (ref 3.5–5.0)
Albumin: 1.3 g/dL — ABNORMAL LOW (ref 3.5–5.0)
Anion gap: 12 (ref 5–15)
Anion gap: 12 (ref 5–15)
BUN: 35 mg/dL — ABNORMAL HIGH (ref 6–20)
BUN: 40 mg/dL — ABNORMAL HIGH (ref 6–20)
CO2: 25 mmol/L (ref 22–32)
CO2: 25 mmol/L (ref 22–32)
Calcium: 8.2 mg/dL — ABNORMAL LOW (ref 8.9–10.3)
Calcium: 8.3 mg/dL — ABNORMAL LOW (ref 8.9–10.3)
Chloride: 100 mmol/L (ref 98–111)
Chloride: 101 mmol/L (ref 98–111)
Creatinine, Ser: 4.65 mg/dL — ABNORMAL HIGH (ref 0.44–1.00)
Creatinine, Ser: 5.04 mg/dL — ABNORMAL HIGH (ref 0.44–1.00)
GFR calc Af Amer: 13 mL/min — ABNORMAL LOW (ref >60)
GFR calc Af Amer: 12 mL/min — ABNORMAL LOW (ref >60)
GFR calc non Af Amer: 11 mL/min — ABNORMAL LOW (ref >60)
GFR calc non Af Amer: 10 mL/min — ABNORMAL LOW (ref >60)
Glucose, Bld: 165 mg/dL — ABNORMAL HIGH (ref 70–99)
Glucose, Bld: 164 mg/dL — ABNORMAL HIGH (ref 70–99)
Phosphorus: 3.2 mg/dL (ref 2.5–4.6)
Phosphorus: 3 mg/dL (ref 2.5–4.6)
Potassium: 3.7 mmol/L (ref 3.5–5.1)
Potassium: 3.8 mmol/L (ref 3.5–5.1)
Sodium: 137 mmol/L (ref 135–145)
Sodium: 138 mmol/L (ref 135–145)

## 2019-04-28 LAB — GLUCOSE, CAPILLARY
Glucose-Capillary: 141 mg/dL — ABNORMAL HIGH (ref 70–99)
Glucose-Capillary: 148 mg/dL — ABNORMAL HIGH (ref 70–99)
Glucose-Capillary: 152 mg/dL — ABNORMAL HIGH (ref 70–99)
Glucose-Capillary: 164 mg/dL — ABNORMAL HIGH (ref 70–99)
Glucose-Capillary: 166 mg/dL — ABNORMAL HIGH (ref 70–99)
Glucose-Capillary: 174 mg/dL — ABNORMAL HIGH (ref 70–99)

## 2019-04-28 LAB — MAGNESIUM: Magnesium: 2.1 mg/dL (ref 1.7–2.4)

## 2019-04-28 LAB — PHOSPHORUS: Phosphorus: 3.1 mg/dL (ref 2.5–4.6)

## 2019-04-28 MED ORDER — PENTAFLUOROPROP-TETRAFLUOROETH EX AERO: 1.0000 "application " | INHALATION_SPRAY | CUTANEOUS | Status: DC | PRN

## 2019-04-28 MED ORDER — HEPARIN SODIUM (PORCINE) 1000 UNIT/ML DIALYSIS: 1000.0000 [IU] | INTRAMUSCULAR | Status: DC | PRN

## 2019-04-28 MED ORDER — MIDODRINE HCL 5 MG PO TABS
10.0000 mg | ORAL_TABLET | Freq: Three times a day (TID) | ORAL | Status: DC
  Administered 2019-04-28 – 2019-04-30 (×7): 10 mg
  Filled 2019-04-28 (×7): qty 2

## 2019-04-28 MED ORDER — SODIUM CHLORIDE 0.9 % IV SOLN: 100.0000 mL | INTRAVENOUS | Status: DC | PRN

## 2019-04-28 MED ORDER — ALTEPLASE 2 MG IJ SOLR
2.0000 mg | Freq: Once | INTRAMUSCULAR | Status: DC | PRN
  Filled 2019-04-28: qty 2

## 2019-04-28 MED ORDER — PANTOPRAZOLE SODIUM 40 MG PO PACK
40.0000 mg | PACK | Freq: Every day | ORAL | Status: DC
  Administered 2019-04-29 – 2019-04-30 (×2): 40 mg
  Filled 2019-04-28 (×3): qty 20

## 2019-04-28 MED ORDER — LIDOCAINE HCL (PF) 1 % IJ SOLN: 5.0000 mL | INTRAMUSCULAR | Status: DC | PRN

## 2019-04-28 MED ORDER — CALCITRIOL 0.5 MCG PO CAPS
ORAL_CAPSULE | ORAL | Status: AC
  Filled 2019-04-28: qty 1

## 2019-04-28 MED ORDER — LIDOCAINE-PRILOCAINE 2.5-2.5 % EX CREA
1.0000 "application " | TOPICAL_CREAM | CUTANEOUS | Status: DC | PRN
  Filled 2019-04-28: qty 5

## 2019-04-28 NOTE — Progress Notes (Signed)
Physical Therapy Treatment Patient Details Name: Carla Compton MRN: [id number] DOB: [birth year] Today's Date: 04/28/2019    History of Present Illness This 38 y.o. female transferred from [facility] hospital with septic shock due to MRSA bactermia.  PMH includes:  ESRD on HD since 02/2019; DVT Rt LE, HTN, CHF, DM    PT Comments    Pt more responsive today, opens eyes to sternal rub initially and then to PROM. Pt able to follow therapist with eyes with movement in room, however unable to follow finger on command. Performed PROM on UE and LE, with commands for active movement. Grimace noted with movement of hips and knees. Pt unable to illicit movement to command, however was once noted to have spontaneous movement in L foot with repositioning. Pt repositioned onto L hip to encourage lengthening of right side of neck with ventilator. Pt may need LTACH level care at d/c depending on how she progresses. PT will continue to follow acutely.   Follow Up Recommendations  SNF     Equipment Recommendations  None recommended by PT       Precautions / Restrictions Precautions Precautions: Fall Restrictions Weight Bearing Restrictions: No    Mobility  Bed Mobility               General bed mobility comments: unable to attempt today  Transfers                 General transfer comment: unable to safely attempt            Cognition Arousal/Alertness: Lethargic Behavior During Therapy: Flat affect Overall Cognitive Status: Impaired/Different from baseline Area of Impairment: Orientation;Attention;Memory;Following commands;Safety/judgement;Awareness;Problem solving                 Orientation Level: Disoriented to;Time Current Attention Level: Focused   Following Commands: Follows one step commands inconsistently Safety/Judgement: Decreased awareness of deficits;Decreased awareness of safety     General Comments: Pt minimally responsive today, opens eyes  and able to focus and follow therapist unable to follow any commands      Exercises General Exercises - Upper Extremity Shoulder Flexion: PROM;Both;10 reps;Supine Elbow Flexion: PROM;Both;10 reps;Supine Elbow Extension: PROM;Both;10 reps;Supine Wrist Flexion: PROM;Both;15 reps;Supine Wrist Extension: PROM;Both;10 reps;Supine Low Level/ICU Exercises Ankle Circles/Pumps: PROM;Both;10 reps Heel Slides: PROM;Both;10 reps;Supine        Pertinent Vitals/Pain Pain Assessment: Faces Faces Pain Scale: Hurts whole lot Pain Location: grimace to knee and hip flexion  Pain Descriptors / Indicators: Grimacing;Guarding;Moaning Pain Intervention(s): Limited activity within patient's tolerance;Monitored during session;Repositioned           PT Goals (current goals can now be found in the care plan section) Acute Rehab PT Goals Patient Stated Goal: to lie down  PT Goal Formulation: With patient Time For Goal Achievement: 05/04/19 Potential to Achieve Goals: Fair Progress towards PT goals: Progressing toward goals    Frequency    Min 2X/week      PT Plan Current plan remains appropriate       AM-PAC PT "6 Clicks" Mobility   Outcome Measure  Help needed turning from your back to your side while in a flat bed without using bedrails?: Total Help needed moving from lying on your back to sitting on the side of a flat bed without using bedrails?: Total Help needed moving to and from a bed to a chair (including a wheelchair)?: Total Help needed standing up from a chair using your arms (e.g., wheelchair or bedside chair)?: Total Help needed to  walk in hospital room?: Total Help needed climbing 3-5 steps with a railing? : Total 6 Click Score: 6    End of Session   Activity Tolerance: Patient limited by pain Patient left: in bed;with call bell/phone within reach;with bed alarm set Nurse Communication: Mobility status PT Visit Diagnosis: Muscle weakness (generalized)  (M62.81);Difficulty in walking, not elsewhere classified (R26.2);Pain Pain - part of body: (back)     Time: [id number] PT Time Calculation (min) (ACUTE ONLY): 21 min  Charges:  $Therapeutic Exercise: 8-22 mins                     [place]ell Makara B. Migdalia Dk PT, DPT Acute Rehabilitation Services Pager [phone] Office [phone]    [place] 04/28/2019, 1:12 PM

## 2019-04-28 NOTE — Progress Notes (Signed)
Spoke w/ pts sister Gabriel Cirri to provide updates. [place] appreciative.

## 2019-04-28 NOTE — Progress Notes (Signed)
[facility] NEPHROLOGY PROGRESS NOTE  Assessment/ Plan: Pt is a 38 y.o. 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  Patient decompensated with shock and bradycardia on 10/27 required CRRT from 10/27-10/29.  #MRSA bacteremia: Seen by ID.  TEE negative for vegetation.  On IV vancomycin for total 4 weeks.  # ESRD: MWF at [facility] [place] for HD today. BP is acceptable so far, may need low dose levophed for intradialytic hypotension. AV fistula is working well.     #Septic shock: was off Levophed this morning.  Increased midodrine to 10 mg 3 times daily.  Antibiotics as above.  Monitor blood pressure.  #Anemia due to sepsis, ESRD: Status post blood transfusion on 11/12.  Continue ESA.  No iron because of sepsis.  #Metabolic bone disease: Phosphorus level acceptable.  Continue Renvela and calcitriol.  #Status post PEA arrest on 10/30: Echo with 55 to 60% EF.  Per primary team.  #Toxic metabolic encephalopathy: Seen by neurology.  Getting MRI today.  No improvement in mental status.  Subjective: Seen and examined in ICU.  Not responding.  Remains on vent.  Off Levophed this morning.  Blood pressure acceptable. Objective Vital signs in last 24 hours: Vitals:   04/28/19 1042 04/28/19 1100 04/28/19 1215 04/28/19 1230  BP:  114/63  (!) 116/55  Pulse:  91 88 86  Resp:  _0 Temp:      TempSrc:      SpO2: 99% 99% 100% 100%  Weight:      Height:       Weight change: -1.4 kg  Intake/Output Summary (Last 24 hours) at 04/28/2019 1257 Last data filed at 04/28/2019 1200 Gross per 24 hour  Intake 1466.5 ml  Output 10 ml  Net 1456.5 ml       Labs: Basic Metabolic Panel: Recent Labs  Lab 04/27/19 0314 04/27/19 1847 04/28/19 0448  NA 139 132* 137  K 3.5 3.6 3.7  CL 101 95* 100  CO2 _1 GLUCOSE 97 159* 165*  BUN 23*  31* 35*  CREATININE 3.64* 4.31* 4.65*  CALCIUM 8.3* 8.1* 8.2*  PHOS 4.1  4.1 3.9 3.2  3.1   Liver Function Tests: Recent Labs  Lab 04/23/19 1434  04/27/19 0314 04/27/19 1847 04/28/19 0448  AST 99*  --   --   --   --   ALT 34  --   --   --   --   ALKPHOS 654*  --   --   --   --   BILITOT 3.0*  --   --   --   --   PROT 6.3*  --   --   --   --   ALBUMIN 1.7*   < > 1.4* 1.4* 1.4*   < > = values in this interval not displayed.   No results for input(s): LIPASE, AMYLASE in the last 168 hours. No results for input(s): AMMONIA in the last 168 hours. CBC: Recent Labs  Lab 04/25/19 0702 04/26/19 0540 04/26/19 0752 04/26/19 1830 04/27/19 0314 04/28/19 0448  WBC 12.8* 10.5 10.2  --  7.8 8.3  HGB 7.2* 6.7* 6.8* 8.1* 7.9* 8.4*  HCT 24.2* 22.8* 23.2* 26.7* 26.9* 27.2*  MCV 99.2 101.3* 100.9*  --  100.0 95.8  PLT 139* 136* 131*  --  126* 126*   Cardiac Enzymes: No  results for input(s): CKTOTAL, CKMB, CKMBINDEX, TROPONINI in the last 168 hours. CBG: Recent Labs  Lab 04/27/19 1926 04/27/19 2310 04/28/19 0311 04/28/19 0907 04/28/19 1252  GLUCAP 140* 142* 152* 174* 148*    Iron Studies: No results for input(s): IRON, TIBC, TRANSFERRIN, FERRITIN in the last 72 hours. Studies/Results: Vas [place] Upper Extremity Venous Duplex  Result Date: 04/27/2019 UPPER VENOUS STUDY  Indications: Pain, and Swelling Comparison Study: no prior Performing Technologist: Abram Sander RVS  Examination Guidelines: A complete evaluation includes B-mode imaging, spectral Doppler, color Doppler, and power Doppler as needed of all accessible portions of each vessel. Bilateral testing is considered an integral part of a complete examination. Limited examinations for reoccurring indications may be performed as noted.  Right Findings: +----------+------------+---------+-----------+----------+--------------+ RIGHT     CompressiblePhasicitySpontaneousProperties   Summary      +----------+------------+---------+-----------+----------+--------------+ IJV                                                 Not visualized +----------+------------+---------+-----------+----------+--------------+ Subclavian    Full                                                 +----------+------------+---------+-----------+----------+--------------+ Axillary      Full                                                 +----------+------------+---------+-----------+----------+--------------+ Brachial      Full                                                 +----------+------------+---------+-----------+----------+--------------+ Radial        Full                                                 +----------+------------+---------+-----------+----------+--------------+ Ulnar         Full                                                 +----------+------------+---------+-----------+----------+--------------+ Cephalic      Full                                                 +----------+------------+---------+-----------+----------+--------------+ Basilic       Full                                                 +----------+------------+---------+-----------+----------+--------------+  Summary:  Right: No evidence of  deep vein thrombosis in the upper extremity. No evidence of superficial vein thrombosis in the upper extremity. No evidence of thrombosis in the subclavian.  *See table(s) above for measurements and observations.  Diagnosing physician: Ruta Hinds MD Electronically signed by Ruta Hinds MD on 04/27/2019 at 9:42:40 AM.    Final     Medications: Infusions: . sodium chloride    . sodium chloride    . sodium chloride    . feeding supplement (VITAL HIGH PROTEIN) Stopped (04/28/19 1130)  . vancomycin      Scheduled Medications: . sodium chloride   Intravenous Once  . allopurinol  100 mg Per Tube BID  . aspirin  81 mg Per Tube Daily  .  calcitRIOL      . calcitRIOL  0.5 mcg Per Tube Q M,W,F-HD  . chlorhexidine gluconate (MEDLINE KIT)  15 mL Mouth Rinse BID  . Chlorhexidine Gluconate Cloth  6 each Topical Daily  . Chlorhexidine Gluconate Cloth  6 each Topical Q0600  . darbepoetin (ARANESP) injection - DIALYSIS  60 mcg Intravenous Q Tue-HD  . heparin injection (subcutaneous)  5,000 Units Subcutaneous Q8H  . insulin aspart  0-9 Units Subcutaneous Q4H  . mouth rinse  15 mL Mouth Rinse 10 times per day  . midodrine  10 mg Per Tube Q8H  . multivitamin  1 tablet Oral QHS  . mupirocin ointment   Nasal BID  . [START ON 04/29/2019] pantoprazole sodium  40 mg Per Tube Daily  . rosuvastatin  40 mg Per Tube QHS  . sevelamer carbonate  800 mg Per Tube TID WC  . sodium chloride flush  5 mL Intracatheter Q8H    have reviewed scheduled and prn medications.  Physical Exam: General: Not responding today  heart:RRR, s1s2 nl Lungs: Coarse breath sound bilateral Abdomen:soft, Non-tender, non-distended Extremities: edema+ Dialysis Access: Left upper extremity AV fistula has good thrill and bruit, able to access.   Leanah Kolander Tanna Furry 04/28/2019,12:57 PM  LOS: 12 days  Pager: [phone]

## 2019-04-28 NOTE — Progress Notes (Addendum)
Neurology Progress Note   S:// Patient seen and examined. Remains encephalopathic.  Not on any sedation.   O:// Current vital signs: BP (!) 101/55   Pulse 85   Temp 98.7 F (37.1 C) (Axillary)   Resp 19   Ht '5\' 2"'  (1.575 m)   Wt 111.2 kg   LMP 04/23/2019   SpO2 96%   BMI 44.84 kg/m  Vital signs in last 24 hours: Temp:  [97.8 F (36.6 C)-99.8 F (37.7 C)] 98.7 F (37.1 C) (11/04 0400) Pulse Rate:  [79-87] 85 (11/04 0600) Resp:  [9-27] 19 (11/04 0600) BP: (100-136)/(50-78) 101/55 (11/04 0600) SpO2:  [80 %-100 %] 96 % (11/04 0746) FiO2 (%):  [30 %-40 %] 40 % (11/04 0746) Weight:  [111.2 kg] 111.2 kg (11/04 0457) Neurological exam Not on sedation, intubated, lying still in bed with no spontaneous movements. Opens eyes to voice but does not follow commands. Cranial nerves: Pupils are equal round reactive light, oculocephalics intact, extraocular movements intact, corneal reflexes intact, cough and gag present.  Difficult ascertain facial symmetry due to the tube. Motor/sensory exam: There is grimacing to noxious stimulation and some withdrawal in the left lower extremity noxious stimulation.  No withdrawal in the right upper right lower and left upper extremity. Mute plantars Unable to assess coordination due to mentation.  Medications  Current Facility-Administered Medications:  .  0.9 %  sodium chloride infusion (Manually program via Guardrails IV Fluids), , Intravenous, Once, Ollis, Brandi L, NP, Stopped at 04/26/19 1600 .  0.9 %  sodium chloride infusion, 250 mL, Intravenous, Continuous, Desai, Rahul P, PA-C .  0.9 %  sodium chloride infusion, 100 mL, Intravenous, PRN, Edrick Oh, MD .  0.9 %  sodium chloride infusion, 100 mL, Intravenous, PRN, Edrick Oh, MD .  acetaminophen (TYLENOL) tablet 650 mg, 650 mg, Per Tube, Q6H PRN **OR** acetaminophen (TYLENOL) suppository 650 mg, 650 mg, Rectal, Q6H PRN, Spero Geralds, MD .  allopurinol (ZYLOPRIM) tablet 100 mg, 100  mg, Per Tube, BID, Spero Geralds, MD, 100 mg at 04/27/19 2111 .  alteplase (CATHFLO ACTIVASE) injection 2 mg, 2 mg, Intracatheter, Once PRN, Edrick Oh, MD .  aspirin chewable tablet 81 mg, 81 mg, Per Tube, Daily, Agarwala, Ravi, MD, 81 mg at 04/27/19 0945 .  calcitRIOL (ROCALTROL) capsule 0.5 mcg, 0.5 mcg, Per Tube, Q M,W,F-HD, Spero Geralds, MD .  chlorhexidine gluconate (MEDLINE KIT) (PERIDEX) 0.12 % solution 15 mL, 15 mL, Mouth Rinse, BID, Agarwala, Ravi, MD, 15 mL at 04/27/19 1940 .  Chlorhexidine Gluconate Cloth 2 % PADS 6 each, 6 each, Topical, Daily, Nahser, Wonda Cheng, MD, 6 each at 04/23/19 1708 .  Chlorhexidine Gluconate Cloth 2 % PADS 6 each, 6 each, Topical, Q0600, Rosita Fire, MD, 6 each at 04/27/19 1044 .  Darbepoetin Alfa (ARANESP) injection 60 mcg, 60 mcg, Intravenous, Q Tue-HD, Nahser, Wonda Cheng, MD, Stopped at 04/20/19 1304 .  feeding supplement (VITAL HIGH PROTEIN) liquid 1,000 mL, 1,000 mL, Per Tube, Continuous, Spero Geralds, MD, Last Rate: 60 mL/hr at 04/28/19 0617 .  fentaNYL (SUBLIMAZE) injection 25 mcg, 25 mcg, Intravenous, Q2H PRN, Agarwala, Ravi, MD, 25 mcg at 04/25/19 2222 .  heparin injection 1,000 Units, 1,000 Units, Dialysis, PRN, Edrick Oh, MD .  heparin injection 5,000 Units, 5,000 Units, Subcutaneous, Q8H, Spero Geralds, MD, 5,000 Units at 04/28/19 0505 .  insulin aspart (novoLOG) injection 0-9 Units, 0-9 Units, Subcutaneous, Q4H, Deterding, Guadelupe Sabin, MD, 2 Units at 04/28/19 [id number] .  lidocaine (PF) (XYLOCAINE) 1 % injection 5 mL, 5 mL, Intradermal, PRN, Edrick Oh, MD .  lidocaine-prilocaine (EMLA) cream 1 application, 1 application, Topical, PRN, Edrick Oh, MD .  MEDLINE mouth rinse, 15 mL, Mouth Rinse, 10 times per day, Kipp Brood, MD, 15 mL at 04/28/19 0506 .  midodrine (PROAMATINE) tablet 10 mg, 10 mg, Per Tube, TID WC, Rosita Fire, MD, 10 mg at 04/27/19 1703 .  multivitamin (RENA-VIT) tablet 1 tablet, 1 tablet, Oral,  QHS, Spero Geralds, MD, 1 tablet at 04/27/19 2112 .  mupirocin ointment (BACTROBAN) 2 %, , Nasal, BID, Kipp Brood, MD, 1 application at 16/10/96 2137 .  ondansetron (ZOFRAN) tablet 4 mg, 4 mg, Per Tube, Q6H PRN **OR** ondansetron (ZOFRAN) injection 4 mg, 4 mg, Intravenous, Q6H PRN, Spero Geralds, MD .  pantoprazole (PROTONIX) injection 40 mg, 40 mg, Intravenous, Daily, Icard, Bradley L, DO, 40 mg at 04/27/19 0945 .  pentafluoroprop-tetrafluoroeth (GEBAUERS) aerosol 1 application, 1 application, Topical, PRN, Edrick Oh, MD .  rosuvastatin (CRESTOR) tablet 40 mg, 40 mg, Per Tube, QHS, Spero Geralds, MD, 40 mg at 04/27/19 2112 .  sevelamer carbonate (RENVELA) tablet 800 mg, 800 mg, Per Tube, TID WC, Spero Geralds, MD, 800 mg at 04/27/19 1808 .  sodium chloride flush (NS) 0.9 % injection 5 mL, 5 mL, Intracatheter, Q8H, Markus Daft, MD, 5 mL at 04/28/19 0508 .  vancomycin (VANCOCIN) IVPB 1000 mg/200 mL premix, 1,000 mg, Intravenous, Q M,W,F-HD, Romona Curls, [facility] Labs CBC    Component Value Date/Time   WBC 8.3 04/28/2019 0448   RBC 2.84 (L) 04/28/2019 0448   HGB 8.4 (L) 04/28/2019 0448   HCT 27.2 (L) 04/28/2019 0448   PLT 126 (L) 04/28/2019 0448   MCV 95.8 04/28/2019 0448   MCH 29.6 04/28/2019 0448   MCHC 30.9 04/28/2019 0448   RDW 17.2 (H) 04/28/2019 0448   LYMPHSABS 1.0 04/17/2019 0320   MONOABS 0.6 04/17/2019 0320   EOSABS 0.4 04/17/2019 0320   BASOSABS 0.0 04/17/2019 0320    CMP     Component Value Date/Time   NA 137 04/28/2019 0448   NA 139 08/05/2018 1231   K 3.7 04/28/2019 0448   CL 100 04/28/2019 0448   CO2 25 04/28/2019 0448   GLUCOSE 165 (H) 04/28/2019 0448   BUN 35 (H) 04/28/2019 0448   BUN 36 (H) 08/05/2018 1231   CREATININE 4.65 (H) 04/28/2019 0448   CALCIUM 8.2 (L) 04/28/2019 0448   PROT 6.3 (L) 04/23/2019 1434   ALBUMIN 1.4 (L) 04/28/2019 0448   AST 99 (H) 04/23/2019 1434   ALT 34 04/23/2019 1434   ALKPHOS 654 (H) 04/23/2019 1434   BILITOT 3.0  (H) 04/23/2019 1434   GFRNONAA 11 (L) 04/28/2019 0448   GFRAA 13 (L) 04/28/2019 0448   Imaging I have reviewed images in epic and the results pertinent to this consultation are: MRI examination of the brain-done on 04/23/2019 shows restricted diffusion lesions of the corpus callosum, with no other gross abnormality.  No microbleeds. Formal report: Brain: No abnormality of the brainstem or cerebellum. There are foci of abnormal restricted diffusion and T2 signal within the corpus callosum, affecting the genu on the left and much of the body. The differential diagnosis includes post viral demyelination, toxic demyelination, multiple sclerosis and neuromyelitis optica. Autoimmune encephalopathy/vasculopathy (Susac syndrome) is possible. Occasionally, posterior reversible encephalopathy can involve only the corpus callosum. Cytotoxic lesions of the corpus callosum can occur subsequent to various immune mediated responses.  Elsewhere, the cerebral hemispheres appear normal. No evidence of hemorrhage, hydrocephalus or extra-axial collection.  Assessment: 38 year old woman with past medical history of rheumatoid arthritis on Plaquenil, came in as a transfer from outside hospital for MRSA bacteremia right psoas abscess status post drainage, and remains encephalopathic.  She had a PEA arrest on 04/23/2019, shortly after the MRI with return of spontaneous circulation in 5 minutes. Persistent encephalopathy was the reason neurology was consulted. Her brain MRI, done prior to the cardiac arrest, shows a cytotoxic lesion of the corpus callosum which carries a broad differentials that includes the spectrum from demyelination/MS/2 autoimmune encephalopathy/vasculopathy suggest Susac's syndrome as well as possibly being secondary to various immune mediated responses.  Post viral demyelination and toxic demyelination can also be considered in differentials. In her case, she has a pretty complicated past medical  history.  Autoimmune vasculitis/meningoencephalitis should also be considered.  Impression: Prolonged encephalopathy -multifactorial in likely secondary to the MRSA bacteremia, end-stage renal disease, psoas abscess. Consider other differentials such as autoimmune vasculitis/meningoencephalitis, less likely to be infectious meningoencephalitis given the imaging findings although no contrast was given due to ESRD, so findings are not definitive.  Recommendations: Obtain MRI of the brain without contrast I do not suspect she is having active seizures at this time as corpus callosum lesions without any other brain abnormality would be rather atypical to see in ongoing seizures. Can consider CTA-patient has contrast allergy which will require preparation with steroids, and given her current bacteremia, steroids might not be the safest thing to consider. We will consider CTA if the MRI shows any new abnormalities since 04/23/2019, low suspicion. A lumbar puncture can be considered after the imaging studies are done, but will need to be done under fluoroscopy guidance due to her body habitus -again, not sure how helpful it might be.  Discussed with Dr. Nelda Marseille on the unit  -- Amie Portland, MD [facility] Pager: [phone] If 7pm to 7am, please call on call as listed on AMION.  CRITICAL CARE ATTESTATION Performed by: Amie Portland, MD Total critical care time: 40 minutes Critical care time was exclusive of separately billable procedures and treating other patients and/or supervising APPs/Residents/Students Critical care was necessary to treat or prevent imminent or life-threatening deterioration due to toxic metabolic encephalopathy This patient is critically ill and at significant risk for neurological worsening and/or death and care requires constant monitoring. Critical care was time spent personally by me on the following activities: development of treatment plan with patient  and/or surrogate as well as nursing, discussions with consultants, evaluation of patient's response to treatment, examination of patient, obtaining history from patient or surrogate, ordering and performing treatments and interventions, ordering and review of laboratory studies, ordering and review of radiographic studies, pulse oximetry, re-evaluation of patient's condition, participation in multidisciplinary rounds and medical decision making of high complexity in the care of this patient.

## 2019-04-28 NOTE — Progress Notes (Signed)
Pt transported to MRI via vent with no complications noted.

## 2019-04-28 NOTE — Progress Notes (Addendum)
NAME:  Carla Compton, MRN:  [id number], DOB:  1981-03-17, LOS: 12 ADMISSION DATE:  04/14/2019, CONSULTATION DATE:  04/28/2019 REFERRING MD:  Dr. Karleen Hampshire, CHIEF COMPLAINT:  Septic shock  History of present illness   Carla Compton is a 38 y.o. F with PMH of recently diagnosed RLE DVT on Xarelto, ESRD, Type 2 DM, Rheumatoid arthritis, HTN and HL who was initially admitted in [place] 10/19 for septic shock with 4/4 BC grew MRSA and she was transferred to [facility] on 10/23 for ID consult and further management.  admitted to ICU for septic shock. On 10/30 underwent PEA arrest  With ROSC after 5 minutes of CPR requiring emergent intubation. Has been persistently obtunded since arrest.   Significant Hospital Events   Admitted 10/23 PEA arrest 10/30 CT guided drain on 11/1   Consults:  Nephrology ID IR Ortho Neurology 04/27/2019  Procedures:  ETT 10/30>> trialysis 10/27>> CT guided drainage of Psoas abscess on 11/1  Significant Diagnostic Tests:  10/24 CT head>>No acute findings  10/24 CXR>>Cardiomegaly with mild vascular congestion. Pneumonia is not Excluded. 10/27 CT RLE>> possible right foot osteomyelitis 10/30MRI head>>no thromboembolic disease [id number]: Transesophageal echocardiogram no evidence of vegetation. 10/30 MRI hip with psoas abscess 12/26/2018 MRI head pending>>  Micro Data:  MRSA PCR positive 10/26 Blood cultures negative since admission 11/01 psoas abscess cultures growing staph.   Antimicrobials:  On vancomycin per ID  Interim history/subjective:  Withdraws to noxious stimuli..   Objective   Blood pressure (!) 101/55, pulse 85, temperature 98.7 F (37.1 C), temperature source Axillary, resp. rate 19, height 5\' 2"  (1.575 m), weight 111.2 kg, last menstrual period 03/25/2019, SpO2 96 %.    Vent Mode: PRVC FiO2 (%):  [30 %-40 %] 40 % Set Rate:  [18 bmp] 18 bmp Vt Set:  [400 mL] 400 mL PEEP:  [5 cmH20] 5 cmH20 Pressure Support:  [5 cmH20] 5  cmH20 Plateau Pressure:  [17 cmH20-20 cmH20] 17 cmH20   Intake/Output Summary (Last 24 hours) at 04/28/2019 0850 Last data filed at 04/28/2019 0600 Gross per 24 hour  Intake 1411.5 ml  Output 10 ml  Net 1401.5 ml   Filed Weights   04/26/19 1139 04/27/19 0500 04/28/19 0457  Weight: 110.8 kg 110.3 kg 111.2 kg    Examination: General: Obese female full mechanical ventilatory support HEENT: Tracheal tube is in place Neuro: Withdraws to noxious stimuli CV: Heart sounds are regular regular rate rhythm PULM: Rhonchi bilaterally GI: soft, bsx4 active  Extremities: warm/dry, 1+ edema, paralysis catheter site is been removed Skin: no rashes or lesions   Assessment & Plan:   Toxic Metabolic Encephalopathy Appreciate neurology's input Holding sedating medications at this time  Acute hypoxemic respiratory failure Wean per protocol Mental status is a block to weaning  ESRD Appreciate nephrology's input Using fistula  Septic/Circulatory Shock  Improved with midodrine Currently on vancomycin for MRSA bacteremia 20 per ID  abscess drained 1100  S/P PEA arrest Appreciate neurology's input MRI has been ordered and neurology aware  Continue to monitor   Acute on chronic blood loss anemia  Recent Labs    04/27/19 0314 04/28/19 0448  HGB 7.9* 8.4*    Continue to monitor Transfuse per protocol  RUE swelling  Upper extremity Doppler studies negative for DVT  Seropositive RA - on plaquenil at home? Monitor  Best practice:  Diet: tube feeds held due to ileus previously.  Will start tube feeds trickle today Pain/Anxiety/Delirium protocol (if indicated): on fentanyl for pain control VAP protocol (  if indicated): yes DVT prophylaxis: [place] adding GI prophylaxis: PPI Glucose control: <180 Mobility: bed rest due to shock Code Status: Full Family Communication: Plan to updated bedside Disposition: needs ICU  Labs   CBC: Recent Labs  Lab 04/25/19 0702 04/26/19 0540  04/26/19 0752 04/26/19 1830 04/27/19 0314 04/28/19 0448  WBC 12.8* 10.5 10.2  --  7.8 8.3  HGB 7.2* 6.7* 6.8* 8.1* 7.9* 8.4*  HCT 24.2* 22.8* 23.2* 26.7* 26.9* 27.2*  MCV 99.2 101.3* 100.9*  --  100.0 95.8  PLT 139* 136* 131*  --  126* 126*    Basic Metabolic Panel: Recent Labs  Lab 04/26/19 0540 04/26/19 0752 04/26/19 1632 04/27/19 0314 04/27/19 1847 04/28/19 0448  NA 139 138 140 139 132* 137  K 3.9 3.8 3.3* 3.5 3.6 3.7  CL 101 98 102 101 95* 100  CO2 24 26 28 27 23 25   GLUCOSE 83 81 103* 97 159* 165*  BUN 43* 44* 19 23* 31* 35*  CREATININE 5.74* 5.87* 2.97* 3.64* 4.31* 4.65*  CALCIUM 8.2* 8.3* 8.2* 8.3* 8.1* 8.2*  MG 2.2  --  2.0 2.0 2.1 2.1  PHOS 5.5* 5.5* 3.4 4.1  4.1 3.9 3.2  3.1   GFR: Estimated Creatinine Clearance: 19.7 mL/min (A) (by C-G formula based on SCr of 4.65 mg/dL (H)). Recent Labs  Lab 04/26/19 0540 04/26/19 0752 04/27/19 0314 04/28/19 0448  WBC 10.5 10.2 7.8 8.3    Liver Function Tests: Recent Labs  Lab 04/23/19 1434  04/26/19 0752 04/26/19 1632 04/27/19 0314 04/27/19 1847 04/28/19 0448  AST 99*  --   --   --   --   --   --   ALT 34  --   --   --   --   --   --   ALKPHOS 654*  --   --   --   --   --   --   BILITOT 3.0*  --   --   --   --   --   --   PROT 6.3*  --   --   --   --   --   --   ALBUMIN 1.7*   < > 1.4* 1.5* 1.4* 1.4* 1.4*   < > = values in this interval not displayed.   No results for input(s): LIPASE, AMYLASE in the last 168 hours. No results for input(s): AMMONIA in the last 168 hours.  ABG    Component Value Date/Time   PHART 7.269 (L) 04/23/2019 1508   PCO2ART 61.5 (H) 04/23/2019 1508   PO2ART 347.0 (H) 04/23/2019 1508   HCO3 28.2 (H) 04/23/2019 1508   TCO2 30 04/23/2019 1508   ACIDBASEDEF 6.0 (H) 04/22/2019 0133   O2SAT 100.0 04/23/2019 1508     Coagulation Profile: Recent Labs  Lab 04/25/19 0702  INR 1.3*    Cardiac Enzymes: No results for input(s): CKTOTAL, CKMB, CKMBINDEX, TROPONINI in the last  168 hours.  HbA1C: Hgb A1c MFr Bld  Date/Time Value Ref Range Status  04/17/2019 03:20 AM 6.9 (H) 4.8 - 5.6 % Final    Comment:    (NOTE) Pre diabetes:          5.7%-6.4% Diabetes:              >6.4% Glycemic control for   <7.0% adults with diabetes   05/26/2018 04:57 AM 7.2 (H) 4.8 - 5.6 % Final    Comment:    (NOTE) Pre diabetes:  5.7%-6.4% Diabetes:              >6.4% Glycemic control for   <7.0% adults with diabetes     CBG: Recent Labs  Lab 04/27/19 1148 04/27/19 1601 04/27/19 1926 04/27/19 2310 04/28/19 0311  GLUCAP 104* 134* 140* 142* 152*     Critical care time:30 min    Richardson Landry Minor ACNP [facility] P[facility]  Attending Note:  38 year old female with extensive PMH who presents to PCCM with respiratory failure and acute encephalopathy.  No events overnight, remains unresponsive.  On exam, unresponsive with clear lungs.  I reviewed CXR myself, ETT is in a good position and no acute disease noted.  Discussed with PCCM-NP.  Will order MRI today.  Consider LP but will defer to neurology.  Hold off weaning today.  Continue abx.  PCCM will continue to follow.  The patient is critically ill with multiple organ systems failure and requires high complexity decision making for assessment and support, frequent evaluation and titration of therapies, application of advanced monitoring technologies and extensive interpretation of multiple databases.   Critical Care Time devoted to patient care services described in this note is  32  Minutes. This time reflects time of care of this signee Dr Jennet Maduro. This critical care time does not reflect procedure time, or teaching time or supervisory time of PA/NP/Med student/Med Resident etc but could involve care discussion time.  Rush Farmer, M.D. [facility] Pulmonary/Critical Care Medicine.

## 2019-04-29 ENCOUNTER — Inpatient Hospital Stay (HOSPITAL_COMMUNITY): Payer: BLUE CROSS/BLUE SHIELD

## 2019-04-29 DIAGNOSIS — R9089 Other abnormal findings on diagnostic imaging of central nervous system: Secondary | ICD-10-CM

## 2019-04-29 LAB — RENAL FUNCTION PANEL
Albumin: 1.3 g/dL — ABNORMAL LOW (ref 3.5–5.0)
Albumin: 1.3 g/dL — ABNORMAL LOW (ref 3.5–5.0)
Anion gap: 13 (ref 5–15)
Anion gap: 12 (ref 5–15)
BUN: 42 mg/dL — ABNORMAL HIGH (ref 6–20)
BUN: 46 mg/dL — ABNORMAL HIGH (ref 6–20)
CO2: 24 mmol/L (ref 22–32)
CO2: 26 mmol/L (ref 22–32)
Calcium: 8.4 mg/dL — ABNORMAL LOW (ref 8.9–10.3)
Calcium: 8.6 mg/dL — ABNORMAL LOW (ref 8.9–10.3)
Chloride: 97 mmol/L — ABNORMAL LOW (ref 98–111)
Chloride: 96 mmol/L — ABNORMAL LOW (ref 98–111)
Creatinine, Ser: 5.07 mg/dL — ABNORMAL HIGH (ref 0.44–1.00)
Creatinine, Ser: 5.6 mg/dL — ABNORMAL HIGH (ref 0.44–1.00)
GFR calc Af Amer: 12 mL/min — ABNORMAL LOW (ref >60)
GFR calc Af Amer: 10 mL/min — ABNORMAL LOW (ref >60)
GFR calc non Af Amer: 10 mL/min — ABNORMAL LOW (ref >60)
GFR calc non Af Amer: 9 mL/min — ABNORMAL LOW (ref >60)
Glucose, Bld: 192 mg/dL — ABNORMAL HIGH (ref 70–99)
Glucose, Bld: 137 mg/dL — ABNORMAL HIGH (ref 70–99)
Phosphorus: 2.4 mg/dL — ABNORMAL LOW (ref 2.5–4.6)
Phosphorus: 2.4 mg/dL — ABNORMAL LOW (ref 2.5–4.6)
Potassium: 4 mmol/L (ref 3.5–5.1)
Potassium: 4.5 mmol/L (ref 3.5–5.1)
Sodium: 134 mmol/L — ABNORMAL LOW (ref 135–145)
Sodium: 134 mmol/L — ABNORMAL LOW (ref 135–145)

## 2019-04-29 LAB — CBC WITH DIFFERENTIAL/PLATELET
Abs Immature Granulocytes: 0.08 10*3/uL — ABNORMAL HIGH (ref 0.00–0.07)
Basophils Absolute: 0 10*3/uL (ref 0.0–0.1)
Basophils Relative: 0 %
Eosinophils Absolute: 0.2 10*3/uL (ref 0.0–0.5)
Eosinophils Relative: 3 %
HCT: 26.4 % — ABNORMAL LOW (ref 36.0–46.0)
Hemoglobin: 8.1 g/dL — ABNORMAL LOW (ref 12.0–15.0)
Immature Granulocytes: 1 %
Lymphocytes Relative: 13 %
Lymphs Abs: 1 10*3/uL (ref 0.7–4.0)
MCH: 29.5 pg (ref 26.0–34.0)
MCHC: 30.7 g/dL (ref 30.0–36.0)
MCV: 96 fL (ref 80.0–100.0)
Monocytes Absolute: 0.5 10*3/uL (ref 0.1–1.0)
Monocytes Relative: 6 %
Neutro Abs: 6.1 10*3/uL (ref 1.7–7.7)
Neutrophils Relative %: 77 %
Platelets: 120 10*3/uL — ABNORMAL LOW (ref 150–400)
RBC: 2.75 MIL/uL — ABNORMAL LOW (ref 3.87–5.11)
RDW: 17.2 % — ABNORMAL HIGH (ref 11.5–15.5)
WBC: 7.9 10*3/uL (ref 4.0–10.5)
nRBC: 0 % (ref 0.0–0.2)

## 2019-04-29 LAB — CSF CELL COUNT WITH DIFFERENTIAL
RBC Count, CSF: 183 /mm3 — ABNORMAL HIGH
Tube #: 1
WBC, CSF: 2 /mm3 (ref 0–5)

## 2019-04-29 LAB — GLUCOSE, CAPILLARY
Glucose-Capillary: 177 mg/dL — ABNORMAL HIGH (ref 70–99)
Glucose-Capillary: 188 mg/dL — ABNORMAL HIGH (ref 70–99)
Glucose-Capillary: 203 mg/dL — ABNORMAL HIGH (ref 70–99)
Glucose-Capillary: 157 mg/dL — ABNORMAL HIGH (ref 70–99)
Glucose-Capillary: 129 mg/dL — ABNORMAL HIGH (ref 70–99)
Glucose-Capillary: 147 mg/dL — ABNORMAL HIGH (ref 70–99)

## 2019-04-29 LAB — HEPATITIS B E ANTIGEN: Hep B E Ag: NEGATIVE

## 2019-04-29 LAB — MAGNESIUM: Magnesium: 2.2 mg/dL (ref 1.7–2.4)

## 2019-04-29 LAB — HEPATITIS B SURFACE ANTIBODY, QUANTITATIVE: Hep B S AB Quant (Post): 14.9 m[IU]/mL (ref >9.9)

## 2019-04-29 LAB — GLUCOSE, CSF: Glucose, CSF: 99 mg/dL — ABNORMAL HIGH (ref 40–70)

## 2019-04-29 LAB — PROTEIN, CSF: Total  Protein, CSF: 21 mg/dL (ref 15–45)

## 2019-04-29 MED ORDER — LIDOCAINE HCL (PF) 1 % IJ SOLN
5.0000 mL | Freq: Once | INTRAMUSCULAR | Status: AC
  Administered 2019-04-29: 16:00:00 5 mL via INTRADERMAL

## 2019-04-29 MED ORDER — CHLORHEXIDINE GLUCONATE CLOTH 2 % EX PADS
6.0000 | MEDICATED_PAD | Freq: Every day | CUTANEOUS | Status: DC
  Administered 2019-04-29 – 2019-05-01 (×3): 6 via TOPICAL

## 2019-04-29 NOTE — Progress Notes (Signed)
[facility] NEPHROLOGY PROGRESS NOTE  Assessment/ Plan: Pt is a 38 y.o. 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  Patient decompensated with shock and bradycardia on 10/27 required CRRT from 10/27-10/29.  #MRSA bacteremia: Seen by ID.  TEE negative for vegetation.  On IV vancomycin for total 4 weeks.  # ESRD: MWF at [facility] [place].  -Status post HD yesterday with 2 L ultrafiltration, tolerated well.  Plan for next HD tomorrow.  AV fistula is working well.     #Septic shock: was off Levophed this morning.  Continue midodrine to 10 mg 3 times daily.  Antibiotics as above.  Monitor blood pressure.  #Anemia due to sepsis, ESRD: Status post blood transfusion on 11/12.  Continue ESA.  No iron because of sepsis.  Transfuse as needed.  #Metabolic bone disease: Phosphorus level low.  Discontinue Renvela, continue calcitriol.    #Status post PEA arrest on 10/30: Echo with 55 to 60% EF.  Per primary team.  #Toxic metabolic encephalopathy: Seen by neurology.  MRI reviewed, neurology is following likely LP today.    Subjective: Seen and examined in ICU.  Remains on ventilator.  She is opening eyes however not following commands.  Review of system limited.  No new event.  Objective Vital signs in last 24 hours: Vitals:   04/29/19 0600 04/29/19 0731 04/29/19 0740 04/29/19 0800  BP: (!) 111/59   (!) 112/56  Pulse: 99   94  Resp: 20   (!) 24  Temp:   98.8 F (37.1 C)   TempSrc:   Oral   SpO2: 97% 99%  98%  Weight:      Height:       Weight change: 1.4 kg  Intake/Output Summary (Last 24 hours) at 04/29/2019 0908 Last data filed at 04/29/2019 0800 Gross per 24 hour  Intake 1725 ml  Output 2001 ml  Net -276 ml       Labs: Basic Metabolic Panel: Recent Labs  Lab 04/28/19 0448 04/28/19 1436 04/29/19 0310  NA 137 138 134*  K 3.7 3.8  4.0  CL 100 101 97*  CO2 '25 25 24  ' GLUCOSE 165* 164* 192*  BUN 35* 40* 42*  CREATININE 4.65* 5.04* 5.07*  CALCIUM 8.2* 8.3* 8.4*  PHOS 3.2  3.1 3.0 2.4*   Liver Function Tests: Recent Labs  Lab 04/23/19 1434  04/28/19 0448 04/28/19 1436 04/29/19 0310  AST 99*  --   --   --   --   ALT 34  --   --   --   --   ALKPHOS 654*  --   --   --   --   BILITOT 3.0*  --   --   --   --   PROT 6.3*  --   --   --   --   ALBUMIN 1.7*   < > 1.4* 1.3* 1.3*   < > = values in this interval not displayed.   No results for input(s): LIPASE, AMYLASE in the last 168 hours. No results for input(s): AMMONIA in the last 168 hours. CBC: Recent Labs  Lab 04/26/19 0540 04/26/19 0752  04/27/19 0314 04/28/19 0448 04/29/19 0310  WBC 10.5 10.2  --  7.8 8.3 7.9  NEUTROABS  --   --   --   --   --  6.1  HGB 6.7* 6.8*   < >  7.9* 8.4* 8.1*  HCT 22.8* 23.2*   < > 26.9* 27.2* 26.4*  MCV 101.3* 100.9*  --  100.0 95.8 96.0  PLT 136* 131*  --  126* 126* 120*   < > = values in this interval not displayed.   Cardiac Enzymes: No results for input(s): CKTOTAL, CKMB, CKMBINDEX, TROPONINI in the last 168 hours. CBG: Recent Labs  Lab 04/28/19 1508 04/28/19 1935 04/28/19 2348 04/29/19 0320 04/29/19 0738  GLUCAP 141* 164* 166* 177* 188*    Iron Studies: No results for input(s): IRON, TIBC, TRANSFERRIN, FERRITIN in the last 72 hours. Studies/Results: Mr Brain Wo Contrast  Result Date: 04/28/2019 CLINICAL DATA:  Bacteremia, end stage renal disease, abnormal MRI with interval cardiac arrest EXAM: MRI HEAD WITHOUT CONTRAST TECHNIQUE: Multiplanar, multiecho pulse sequences of the brain and surrounding structures were obtained without intravenous contrast. COMPARISON:  April 23, 2019 FINDINGS: Brain: There is persistent and expanded reduced diffusion along the corpus callosum primarily involving the body with some extension into the genu on the left. New small foci of reduced diffusion are identified adjacent to  the left frontal horn near the foramen of Monro, right frontal subcortical white matter, and adjacent to the atrium of the right lateral ventricle. There is no significant mass effect. No evidence of intracranial hemorrhage. Vascular: Major vessel flow voids at the skull base are preserved. Skull and upper cervical spine: Decreased T1 marrow signal, which may reflect hematopoetic marrow. Sinuses/Orbits: Diffuse paranasal sinus mucosal thickening. Orbits are unremarkable. Other: Right mastoid effusion. IMPRESSION: Extension of abnormal diffusion signal involving the corpus callosum with unchanged differential including primary lesion and an immune mediated secondary lesion. Minimal new additional punctate foci of abnormal diffusion, which may reflect the same process or unrelated new infarcts. Electronically Signed   By: Macy Mis M.D.   On: 04/28/2019 13:07   Dg Chest Port 1 View  Result Date: 04/29/2019 CLINICAL DATA:  Respiratory failure EXAM: PORTABLE CHEST 1 VIEW COMPARISON:  04/23/2019 FINDINGS: The endotracheal tube terminates above the carina and at the thoracic inlet. The enteric tubes extend below the left hemidiaphragm. The heart size remains enlarged. There are streaky, hazy bilateral airspace opacities, most notably in the right upper lobe. There is no pneumothorax. A small right-sided pleural effusion is suspected. IMPRESSION: 1. Lines and tubes as above. 2. Persistent interstitial persistent hazy bilateral airspace opacities, greatest within the right upper lobe. Electronically Signed   By: Constance Holster M.D.   On: 04/29/2019 06:09    Medications: Infusions: . sodium chloride    . sodium chloride    . sodium chloride    . feeding supplement (VITAL HIGH PROTEIN) 65 mL/hr at 04/29/19 0600  . vancomycin 1,000 mg (04/28/19 1820)    Scheduled Medications: . sodium chloride   Intravenous Once  . allopurinol  100 mg Per Tube BID  . aspirin  81 mg Per Tube Daily  . calcitRIOL  0.5  mcg Per Tube Q M,W,F-HD  . chlorhexidine gluconate (MEDLINE KIT)  15 mL Mouth Rinse BID  . Chlorhexidine Gluconate Cloth  6 each Topical Daily  . Chlorhexidine Gluconate Cloth  6 each Topical Q0600  . darbepoetin (ARANESP) injection - DIALYSIS  60 mcg Intravenous Q Tue-HD  . heparin injection (subcutaneous)  5,000 Units Subcutaneous Q8H  . insulin aspart  0-9 Units Subcutaneous Q4H  . mouth rinse  15 mL Mouth Rinse 10 times per day  . midodrine  10 mg Per Tube Q8H  . multivitamin  1 tablet Oral  QHS  . mupirocin ointment   Nasal BID  . pantoprazole sodium  40 mg Per Tube Daily  . rosuvastatin  40 mg Per Tube QHS  . sevelamer carbonate  800 mg Per Tube TID WC  . sodium chloride flush  5 mL Intracatheter Q8H    have reviewed scheduled and prn medications.  Physical Exam: General: Just opening eyes but not following commands  heart:RRR, s1s2 nl Lungs: Coarse breath sound bilateral Abdomen:soft, Non-tender, non-distended Extremities: edema+ Dialysis Access: Left upper extremity AV fistula has good thrill and bruit, able to access.   Joaquin Knebel Prasad Sonyia Muro 04/29/2019,9:08 AM  LOS: 13 days  Pager: [phone]

## 2019-04-29 NOTE — Progress Notes (Signed)
Fluoroscopy guided LP orders placed in chart. Spoke with fluoroscopy department.  They will have her on the schedule sometime this afternoon or evening as the schedule permits. Please hold subcu heparin.  Spoke with the nurse and asked her to hold subcu heparin dose.   -- Amie Portland, MD [facility] Pager: [phone] If 7pm to 7am, please call on call as listed on AMION.

## 2019-04-29 NOTE — Progress Notes (Signed)
Appreciate Dr. Nevada Crane for performing fluoro guided LP.  Results from CSF analysis: Glu 99 Prt 21 RBC 183 WBC 2  Will follow   -- Amie Portland, MD [facility] Pager: [phone] If 7pm to 7am, please call on call as listed on AMION.

## 2019-04-29 NOTE — Progress Notes (Addendum)
Neurology Progress Note   S:// Patient seen and examined. No acute changes Patient was reportedly following some commands-wiggling toes overnight.    O:// Current vital signs: BP (!) 112/56 (BP Location: Right Leg)   Pulse 94   Temp 98.8 F (37.1 C) (Oral)   Resp (!) 24   Ht '5\' 2"'  (1.575 m)   Wt 112.6 kg   LMP 03/30/2019   SpO2 98%   BMI 45.40 kg/m  Vital signs in last 24 hours: Temp:  [97.8 F (36.6 C)-99.1 F (37.3 C)] 98.8 F (37.1 C) (11/05 0740) Pulse Rate:  [79-99] 94 (11/05 0800) Resp:  [0-27] 24 (11/05 0800) BP: (90-135)/(49-69) 112/56 (11/05 0800) SpO2:  [95 %-100 %] 98 % (11/05 0800) FiO2 (%):  [30 %-40 %] 30 % (11/05 0731) Weight:  [112.6 kg] 112.6 kg (11/05 0347) General: Intubated, no sedation HEENT: Normocephalic atraumatic Lungs: Clear to auscultation, intubated Cardiovascular: Regular rate  Extremities: Trace edema Neurological exam She is awake, inconsistently tracks the examiner. She did not follow any commands for me. She looks rather uncomfortable or in some sort of discomfort. Cranial nerves: Pupils equal round reactive to light, no gaze preference or deviation, able to move both sides, difficult ascertain facial symmetry. Motor exam: Grimaces to noxious stimulation with some withdrawal in the left lower extremity but no withdrawal elsewhere. Sensory exam as above Mute plantars. Unable to assess cerebellar function due to mentation.  Medications  Current Facility-Administered Medications:  .  0.9 %  sodium chloride infusion (Manually program via Guardrails IV Fluids), , Intravenous, Once, Ollis, Brandi L, NP, Stopped at 04/26/19 1600 .  0.9 %  sodium chloride infusion, 250 mL, Intravenous, Continuous, Desai, Rahul P, PA-C .  0.9 %  sodium chloride infusion, 100 mL, Intravenous, PRN, Rosita Fire, MD .  0.9 %  sodium chloride infusion, 100 mL, Intravenous, PRN, Rosita Fire, MD .  acetaminophen (TYLENOL) tablet 650 mg, 650  mg, Per Tube, Q6H PRN **OR** acetaminophen (TYLENOL) suppository 650 mg, 650 mg, Rectal, Q6H PRN, Spero Geralds, MD .  allopurinol (ZYLOPRIM) tablet 100 mg, 100 mg, Per Tube, BID, Spero Geralds, MD, 100 mg at 04/28/19 2131 .  alteplase (CATHFLO ACTIVASE) injection 2 mg, 2 mg, Intracatheter, Once PRN, Rosita Fire, MD .  aspirin chewable tablet 81 mg, 81 mg, Per Tube, Daily, Kipp Brood, MD, 81 mg at 04/28/19 0917 .  calcitRIOL (ROCALTROL) capsule 0.5 mcg, 0.5 mcg, Per Tube, Q M,W,F-HD, Spero Geralds, MD, 0.5 mcg at 04/28/19 1401 .  chlorhexidine gluconate (MEDLINE KIT) (PERIDEX) 0.12 % solution 15 mL, 15 mL, Mouth Rinse, BID, Agarwala, Ravi, MD, 15 mL at 04/29/19 0815 .  Chlorhexidine Gluconate Cloth 2 % PADS 6 each, 6 each, Topical, Daily, Nahser, Wonda Cheng, MD, 6 each at 04/28/19 1506 .  Chlorhexidine Gluconate Cloth 2 % PADS 6 each, 6 each, Topical, Q0600, Rosita Fire, MD, 6 each at 04/27/19 1044 .  Darbepoetin Alfa (ARANESP) injection 60 mcg, 60 mcg, Intravenous, Q Tue-HD, Nahser, Wonda Cheng, MD, Stopped at 04/20/19 1304 .  feeding supplement (VITAL HIGH PROTEIN) liquid 1,000 mL, 1,000 mL, Per Tube, Continuous, Spero Geralds, MD, Last Rate: 65 mL/hr at 04/29/19 0600 .  fentaNYL (SUBLIMAZE) injection 25 mcg, 25 mcg, Intravenous, Q2H PRN, Agarwala, Ravi, MD, 25 mcg at 04/25/19 2222 .  heparin injection 1,000 Units, 1,000 Units, Dialysis, PRN, Rosita Fire, MD .  heparin injection 5,000 Units, 5,000 Units, Subcutaneous, Q8H, Spero Geralds, MD, 5,000  Units at 04/29/19 0526 .  insulin aspart (novoLOG) injection 0-9 Units, 0-9 Units, Subcutaneous, Q4H, Deterding, Guadelupe Sabin, MD, 2 Units at 04/29/19 [phone] .  lidocaine (PF) (XYLOCAINE) 1 % injection 5 mL, 5 mL, Intradermal, PRN, Rosita Fire, MD .  lidocaine-prilocaine (EMLA) cream 1 application, 1 application, Topical, PRN, Rosita Fire, MD .  MEDLINE mouth rinse, 15 mL, Mouth Rinse, 10 times per  day, Kipp Brood, MD, 15 mL at 04/29/19 0527 .  midodrine (PROAMATINE) tablet 10 mg, 10 mg, Per Tube, Q8H, Rush Farmer, MD, 10 mg at 04/29/19 0526 .  multivitamin (RENA-VIT) tablet 1 tablet, 1 tablet, Oral, QHS, Spero Geralds, MD, 1 tablet at 04/28/19 2132 .  mupirocin ointment (BACTROBAN) 2 %, , Nasal, BID, Kipp Brood, MD, 1 application at 19/62/22 2151 .  ondansetron (ZOFRAN) tablet 4 mg, 4 mg, Per Tube, Q6H PRN **OR** ondansetron (ZOFRAN) injection 4 mg, 4 mg, Intravenous, Q6H PRN, Spero Geralds, MD .  pantoprazole sodium (PROTONIX) 40 mg/20 mL oral suspension 40 mg, 40 mg, Per Tube, Daily, Priscella Mann, RPH .  pentafluoroprop-tetrafluoroeth (GEBAUERS) aerosol 1 application, 1 application, Topical, PRN, Rosita Fire, MD .  rosuvastatin (CRESTOR) tablet 40 mg, 40 mg, Per Tube, QHS, Spero Geralds, MD, 40 mg at 04/28/19 2131 .  sevelamer carbonate (RENVELA) tablet 800 mg, 800 mg, Per Tube, TID WC, Spero Geralds, MD, 800 mg at 04/29/19 0804 .  sodium chloride flush (NS) 0.9 % injection 5 mL, 5 mL, Intracatheter, Q8H, Anselm Pancoast, Adam, MD, 5 mL at 04/29/19 0527 .  vancomycin (VANCOCIN) IVPB 1000 mg/200 mL premix, 1,000 mg, Intravenous, Q M,W,F-HD, Romona Curls, [facility], Last Rate: 200 mL/hr at 04/28/19 1820, 1,000 mg at 04/28/19 1820 Labs CBC    Component Value Date/Time   WBC 7.9 04/29/2019 0310   RBC 2.75 (L) 04/29/2019 0310   HGB 8.1 (L) 04/29/2019 0310   HCT 26.4 (L) 04/29/2019 0310   PLT 120 (L) 04/29/2019 0310   MCV 96.0 04/29/2019 0310   MCH 29.5 04/29/2019 0310   MCHC 30.7 04/29/2019 0310   RDW 17.2 (H) 04/29/2019 0310   LYMPHSABS 1.0 04/29/2019 0310   MONOABS 0.5 04/29/2019 0310   EOSABS 0.2 04/29/2019 0310   BASOSABS 0.0 04/29/2019 0310    CMP     Component Value Date/Time   NA 134 (L) 04/29/2019 0310   NA 139 08/05/2018 1231   K 4.0 04/29/2019 0310   CL 97 (L) 04/29/2019 0310   CO2 24 04/29/2019 0310   GLUCOSE 192 (H) 04/29/2019 0310   BUN 42  (H) 04/29/2019 0310   BUN 36 (H) 08/05/2018 1231   CREATININE 5.07 (H) 04/29/2019 0310   CALCIUM 8.4 (L) 04/29/2019 0310   PROT 6.3 (L) 04/23/2019 1434   ALBUMIN 1.3 (L) 04/29/2019 0310   AST 99 (H) 04/23/2019 1434   ALT 34 04/23/2019 1434   ALKPHOS 654 (H) 04/23/2019 1434   BILITOT 3.0 (H) 04/23/2019 1434   GFRNONAA 10 (L) 04/29/2019 0310   GFRAA 12 (L) 04/29/2019 0310    glycosylated hemoglobin  Lipid Panel     Component Value Date/Time   CHOL 194 05/27/2018 0349   TRIG 190 (H) 05/27/2018 0349   HDL 40 (L) 05/27/2018 0349   CHOLHDL 4.9 05/27/2018 0349   VLDL 38 05/27/2018 0349   LDLCALC 116 (H) 05/27/2018 0349     Imaging I have reviewed images in epic and the results pertinent to this consultation are: MR brain  from 04/23/2019 with abnormal restricted diffusion and T2 signal within the corpus callosum affecting the genu on the left and much of the body. Repeat MRI brain done yesterday showed extension of the restricted diffusion signal now involving most of the corpus callosum with unchanged differentials-primary lesion versus immune mediated secondary lesion. Minimal new additional punctate foci of abnormal restricted diffusion-either the same process versus unrelated infarct.  Assessment: 38 year old woman with a past medical history of rheumatoid arthritis on Plaquenil, came in as a transfer from outside hospital for MRSA bacteremia, found to have a right psoas abscess which is status post drainage, neurological consultation obtained due to persistent encephalopathy following this. She also had a brief PEA arrest on 04/23/2019 with 5 minutes downtime prior to return of spontaneous circulation. Brain MRI on 04/23/2019 was done prior to the cardiac arrest showed a cytotoxic lesion of the corpus callosum with a broad differential including demyelination, autoimmune, vasculopathy along with Susac syndrome. Repeat MRI yesterday showed extension of the cytotoxic lesion of the  corpus callosum again keeping him with the same differentials of a primary cytotoxic lesion and differentials remaining-autoimmune versus inflammatory versus less likely malignant example lymphoma due to immune compromised status.  Other differentials include hemolytic uremic syndrome again in the vein of autoimmune processes. Less likely vasculitides but cannot be ruled out. Given her history of MRSA bacteremia and current treatment with antibiotics, I would like to pursue further work-up with a spinal tap if possible prior to recommending initiating steroids, which have to be used with extreme caution given her bacteremia.  Impression: -Cytotoxic lesion of corpus callosum-due to broad differential of autoimmune versus inflammatory versus malignant such as lymphoma. -Persistent encephalopathy-likely multifactorial toxic metabolic  Recommendations: -Will recommend spinal tap under fluoroscopic guidance given the body habitus and history of rheumatoid arthritis. -If the CSF is negative for infection and strongly suggestive of inflammation, that would give with impetus to start steroids, even with the knowledge that she is currently septic because the benefits are likely to outweigh the risks. -Continue management of the toxic metabolic derangements per primary team as you are. -After fluoroscopy guided spinal tap, might consider a CTA-has a listed allergies-affected kidneys but currently she is on hemodialysis and that should not be a problem.  CSF test: -Protein cell count glucose -IgG index -Monoclonal bands -Culture and Gram stain -Angiotensin-converting enzyme -Cytology -Flow cytometry  D/W Dr. Valeta Harms on the unit  -- Amie Portland, MD [facility] Pager: [phone] If 7pm to 7am, please call on call as listed on AMION.   CRITICAL CARE ATTESTATION Performed by: Amie Portland, MD Total critical care time: 45 minutes Critical care time was exclusive of separately billable  procedures and treating other patients and/or supervising APPs/Residents/Students Critical care was necessary to treat or prevent imminent or life-threatening deterioration due to multifactorial toxic metabolic encephalopathy, cytotoxic lesion of the corpus callosum This patient is critically ill and at significant risk for neurological worsening and/or death and care requires constant monitoring. Critical care was time spent personally by me on the following activities: development of treatment plan with patient and/or surrogate as well as nursing, discussions with consultants, evaluation of patient's response to treatment, examination of patient, obtaining history from patient or surrogate, ordering and performing treatments and interventions, ordering and review of laboratory studies, ordering and review of radiographic studies, pulse oximetry, re-evaluation of patient's condition, participation in multidisciplinary rounds and medical decision making of high complexity in the care of this patient.

## 2019-04-29 NOTE — Progress Notes (Signed)
Assisted tele visit to patient with family member.  Iliya Spivack M, RN  

## 2019-04-29 NOTE — Progress Notes (Addendum)
NAME:  Carla Compton, MRN:  [id number], DOB:  1981/02/19, LOS: 69 ADMISSION DATE:  03/30/2019, CONSULTATION DATE:  04/29/2019 REFERRING MD:  Dr. Karleen Hampshire, CHIEF COMPLAINT:  Septic shock  History of present illness   Carla Compton is a 38 y.o. F with PMH of recently diagnosed RLE DVT on Xarelto, ESRD, Type 2 DM, Rheumatoid arthritis, HTN and HL who was initially admitted in [place] 10/19 for septic shock with 4/4 BC grew MRSA and she was transferred to [facility] on 10/23 for ID consult and further management.  admitted to ICU for septic shock. On 10/30 underwent PEA arrest  With ROSC after 5 minutes of CPR requiring emergent intubation. Has been persistently obtunded since arrest.   Significant Hospital Events   Admitted 10/23 PEA arrest 10/30 CT guided drain on 11/1   Consults:  Nephrology ID IR Ortho Neurology 04/27/2019  Procedures:  ETT 10/30>> trialysis 10/27>> CT guided drainage of Psoas abscess on 11/1  Significant Diagnostic Tests:  10/24 CT head>>No acute findings  10/24 CXR>>Cardiomegaly with mild vascular congestion. Pneumonia is not Excluded. 10/27 CT RLE>> possible right foot osteomyelitis 10/30MRI head>>no thromboembolic disease [id number]: Transesophageal echocardiogram no evidence of vegetation. 10/30 MRI hip with psoas abscess 12/26/2018 MRI head pending>>  Micro Data:  MRSA PCR positive 10/26 Blood cultures negative since admission 11/01 psoas abscess cultures growing staph.   Antimicrobials:  On vancomycin per ID  Interim history/subjective:  Withdraws to noxious stimuli..   Objective   Blood pressure (!) 112/56, pulse 94, temperature 98.8 F (37.1 C), temperature source Oral, resp. rate (!) 24, height 5\' 2"  (1.575 m), weight 112.6 kg, last menstrual period 03/29/2019, SpO2 98 %.    Vent Mode: PSV;CPAP FiO2 (%):  [30 %-40 %] 30 % Set Rate:  [18 bmp] 18 bmp Vt Set:  [400 mL] 400 mL PEEP:  [5 cmH20] 5 cmH20 Pressure Support:  [10  cmH20] 10 cmH20 Plateau Pressure:  [12 cmH20-20 cmH20] 13 cmH20   Intake/Output Summary (Last 24 hours) at 04/29/2019 0837 Last data filed at 04/29/2019 0800 Gross per 24 hour  Intake 1835 ml  Output 2001 ml  Net -166 ml   Filed Weights   04/27/19 0500 04/28/19 0457 04/29/19 0347  Weight: 110.3 kg 111.2 kg 112.6 kg    Examination: General: Obese female.  Awake with open eyes though not responsive to voice.  Not able to follow commands. HEENT: Moist mucous membranes.  Difficult to assess JVD due to neck girth. Cardio: Distant heart sounds.  Regular rate and rhythm on monitor.  Palpable radial pulse. Pulm: Ventilated, synchronous breathing with the vent.  Transmitted upper airway sounds on auscultation. Abdomen: Bowel sounds normal. Abdomen soft and non-tender.  Drain with white purulent discharge. Extremities: No peripheral edema. Warm/ well perfused.  Strong radial pulse on right.  Palpable thrill left of left wrist.. Neuro: Generally unresponsive to neuro exam.  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  Additional contributions from corpus callosum lesion.  EEG on 11/3 shows no evidence of seizures but severe diffuse encephalopathy. -Neurology following, appreciate recommendations -We will consider LP today -May benefit from steroids although this may worsen her MRSA bacteremia  Brain lesions MRI from 11/4 showed extension of the corpus callosum lesion and new additional punctate foci of abnormal diffusion.  Differential remains wide and includes demyelination pathology, MS, autoimmune mediated, PRES. neurology is following and aiding with diagnosis and treatment. -  Neurology following, appreciate recommendations -Possible LP today  MRSA bacteremia Deep wound culture from 11/1 growing MRSA.  Blood cultures obtained 10/25 showed no growth final read.  TEE on 10/26  shows no vegetations although new punctate foci noted above are concerning for septic emboli. - Vancomycin per pharmacy, for 4 weeks - Psoas abscess drained by IR on 11/4, IR following for removal  Acute hypoxemic respiratory failure Currently off sedatives.  Awake and unresponsive.  Mental status is largest barrier to extubation. -Mental status is a block to weaning  ESRD -Nephrology following  Septic/Circulatory Shock - improved Off IV pressors.  Maintaining maps from 62-88 in the past 24 hours.  Stable vitals in the past 24 hours - midodrine 10 mg 3 times daily  S/P PEA arrest Appreciate neurology's input MRI has been ordered and neurology aware  Continue to monitor  Acute on chronic blood loss anemia  8.1 today from 8.4 on 11/4. -Continue to monitor -Transfuse for hemoglobin under 7  RUE swelling  -Upper extremity Doppler studies negative for DVT  Seropositive RA - on plaquenil at home? Monitor  Best practice:  Diet: tube feeds held due to ileus previously.  Will start tube feeds trickle today Pain/Anxiety/Delirium protocol (if indicated): on fentanyl for pain control VAP protocol (if indicated): yes DVT prophylaxis: Nittany adding GI prophylaxis: PPI Glucose control: <180 Mobility: bed rest due to shock Code Status: Full Family Communication: Plan to updated bedside Disposition: needs ICU  Labs   CBC: Recent Labs  Lab 04/26/19 0540 04/26/19 0752 04/26/19 1830 04/27/19 0314 04/28/19 0448 04/29/19 0310  WBC 10.5 10.2  --  7.8 8.3 7.9  NEUTROABS  --   --   --   --   --  6.1  HGB 6.7* 6.8* 8.1* 7.9* 8.4* 8.1*  HCT 22.8* 23.2* 26.7* 26.9* 27.2* 26.4*  MCV 101.3* 100.9*  --  100.0 95.8 96.0  PLT 136* 131*  --  126* 126* 120*    Basic Metabolic Panel: Recent Labs  Lab 04/26/19 1632 04/27/19 0314 04/27/19 1847 04/28/19 0448 04/28/19 1436 04/29/19 0310  NA 140 139 132* 137 138 134*  K 3.3* 3.5 3.6 3.7 3.8 4.0  CL 102 101 95* 100 101 97*  CO2 28 27 23  25 25 24   GLUCOSE 103* 97 159* 165* 164* 192*  BUN 19 23* 31* 35* 40* 42*  CREATININE 2.97* 3.64* 4.31* 4.65* 5.04* 5.07*  CALCIUM 8.2* 8.3* 8.1* 8.2* 8.3* 8.4*  MG 2.0 2.0 2.1 2.1  --  2.2  PHOS 3.4 4.1  4.1 3.9 3.2  3.1 3.0 2.4*   GFR: Estimated Creatinine Clearance: 18.2 mL/min (A) (by C-G formula based on SCr of 5.07 mg/dL (H)). Recent Labs  Lab 04/26/19 0752 04/27/19 0314 04/28/19 0448 04/29/19 0310  WBC 10.2 7.8 8.3 7.9    Liver Function Tests: Recent Labs  Lab 04/23/19 1434  04/27/19 0314 04/27/19 1847 04/28/19 0448 04/28/19 1436 04/29/19 0310  AST 99*  --   --   --   --   --   --   ALT 34  --   --   --   --   --   --   ALKPHOS 654*  --   --   --   --   --   --   BILITOT 3.0*  --   --   --   --   --   --   PROT 6.3*  --   --   --   --   --   --  ALBUMIN 1.7*   < > 1.4* 1.4* 1.4* 1.3* 1.3*   < > = values in this interval not displayed.   No results for input(s): LIPASE, AMYLASE in the last 168 hours. No results for input(s): AMMONIA in the last 168 hours.  ABG    Component Value Date/Time   PHART 7.269 (L) 04/23/2019 1508   PCO2ART 61.5 (H) 04/23/2019 1508   PO2ART 347.0 (H) 04/23/2019 1508   HCO3 28.2 (H) 04/23/2019 1508   TCO2 30 04/23/2019 1508   ACIDBASEDEF 6.0 (H) 04/22/2019 0133   O2SAT 100.0 04/23/2019 1508     Coagulation Profile: Recent Labs  Lab 04/25/19 0702  INR 1.3*    Cardiac Enzymes: No results for input(s): CKTOTAL, CKMB, CKMBINDEX, TROPONINI in the last 168 hours.  HbA1C: Hgb A1c MFr Bld  Date/Time Value Ref Range Status  04/17/2019 03:20 AM 6.9 (H) 4.8 - 5.6 % Final    Comment:    (NOTE) Pre diabetes:          5.7%-6.4% Diabetes:              >6.4% Glycemic control for   <7.0% adults with diabetes   05/26/2018 04:57 AM 7.2 (H) 4.8 - 5.6 % Final    Comment:    (NOTE) Pre diabetes:          5.7%-6.4% Diabetes:              >6.4% Glycemic control for   <7.0% adults with diabetes     CBG: Recent Labs  Lab  04/28/19 1508 04/28/19 1935 04/28/19 2348 04/29/19 0320 04/29/19 0738  GLUCAP 141* 164* 166* 177* 188*    PCCM attending:  This is a 38 year old female multiple medical comorbidities presents septic shock MRSA bacteremia PEA arrest on presentation.  MRI with several corpus callosum brain lesions.  Seen by neurology.  Bacteremia clearing.  Psoas abscess status post drainage.  Acute hypoxemic respiratory failure requiring intubation mechanical ventilation.  ESRD requiring continued dialysis need.  Also has seropositive RA on Plaquenil.  Patient remains intubated on mechanical life support in the intensive care unit.  BP (!) 100/53   Pulse 98   Temp 98.8 F (37.1 C) (Oral)   Resp 19   Ht 5\' 2"  (1.575 m)   Wt 112.6 kg   LMP 04/15/2019   SpO2 99%   BMI 45.40 kg/m   General: Intubated on mechanical life support young female Neck: Large neck HEENT: Tracking appropriately, endotracheal tube in place, NCAT Cardiac: Regular rate and rhythm, S1-S2 Lungs: Bilateral ventilated breath sounds Extremities: No significant edema.  Labs reviewed Imaging reviewed: Imaging of the brain discussed with neurology at bedside.  Assessment: Acute metabolic encephalopathy Abnormal brain imaging, corpus callosum injury/lesions unclear etiology at this time discussed with neurology various inflammatory diseases versus malignancy as potential cause.  ESRD on IHD Acute hypoxemic respiratory failure requiring intubation mechanical ventilation likely secondary to inability to protect airway in the setting of metabolic encephalopathy and sepsis as described.  MRSA bacteremia Psoas bursa fluid collection, MRSA positive.  Autoimmune disease, seropositive RA  Plan: Continue antimicrobials, on vancomycin Remains off vasopressors Remains on mechanical ventilatory support. Wean from ventilator as tolerated maintain FiO2 to maintain SPO2 greater than 90%. Discuss further brain imaging plans with  neurology. Recommending LP.  This has been arranged by neurology.  We appreciate their input. If the LP is negative for any evidence of infection there is discussion whether or not risk-benefit would be to start steroid therapy for  these corpus callosal lesion. We appreciate neurology's input and help with this case.   This patient is critically ill with multiple organ system failure; which, requires frequent high complexity decision making, assessment, support, evaluation, and titration of therapies. This was completed through the application of advanced monitoring technologies and extensive interpretation of multiple databases. During this encounter critical care time was devoted to patient care services described in this note for 32 minutes.  Garner Nash, DO [facility] 04/29/2019 3:34 PM

## 2019-04-29 NOTE — Progress Notes (Signed)
Transported patient from 3M09 to 2M06 without event.

## 2019-04-29 NOTE — Progress Notes (Signed)
Pharmacy Antibiotic Note  Carla Compton is a 38 y.o. female admitted on 03/29/2019 with persistent MRSA bacteremia. Repeat BCx negative. PMH of ESRD with HD MWF.  Last scheduled vancomycin dose previously held given elevated vancomycin level at 30. Patient scheduled for HD today.  Pre- HD goal 15-25 mcg/mL Post-HD goal 5-15 mcg/mL  Plan: Resume vancomycin 1000mg  IV post-HD today. Obtain vancomycin level following HD session. Monitor for signs of continued clinical improvement, blood cx, and pre-HD vancomycin level as indicated. ID planning 4 weeks of treatment through 11/23   Height: 5\' 2"  (157.5 cm) Weight: 248 lb 3.8 oz (112.6 kg) IBW/kg (Calculated) : 50.1  Temp (24hrs), Avg:98.4 F (36.9 C), Min:97.8 F (36.6 C), Max:99.1 F (37.3 C)  Recent Labs  Lab 04/26/19 0540 04/26/19 0752  04/27/19 0314 04/27/19 1847 04/28/19 0448 04/28/19 1436 04/29/19 0310  WBC 10.5 10.2  --  7.8  --  8.3  --  7.9  CREATININE 5.74* 5.87*   < > 3.64* 4.31* 4.65* 5.04* 5.07*  VANCORANDOM 30  --   --   --   --   --   --   --    < > = values in this interval not displayed.    Estimated Creatinine Clearance: 18.2 mL/min (A) (by C-G formula based on SCr of 5.07 mg/dL (H)).    Allergies  Allergen Reactions  . Contrast Media [Iodinated Diagnostic Agents] Other (See Comments)    Affected her kidneys  . Pepto-Bismol [Bismuth] Other (See Comments)    Unknown reaction   Onnie Boer; PharmD Candidate 04/29/2019 11:03 AM

## 2019-04-29 NOTE — Procedures (Signed)
Procedure: LP w Fluoro guidance at L2/3. Specimen: CSF, to lab Bleeding: minimal. Complications: None immediate. Patient   -Condition: Stable.  -Disposition:  Return to inpt ward.  Full Radiology Report to follow under IMAGING

## 2019-04-30 ENCOUNTER — Inpatient Hospital Stay (HOSPITAL_COMMUNITY): Payer: BLUE CROSS/BLUE SHIELD

## 2019-04-30 DIAGNOSIS — R6521 Severe sepsis with septic shock: Secondary | ICD-10-CM

## 2019-04-30 DIAGNOSIS — J9621 Acute and chronic respiratory failure with hypoxia: Secondary | ICD-10-CM

## 2019-04-30 DIAGNOSIS — Z8674 Personal history of sudden cardiac arrest: Secondary | ICD-10-CM

## 2019-04-30 DIAGNOSIS — K6812 Psoas muscle abscess: Secondary | ICD-10-CM

## 2019-04-30 DIAGNOSIS — G9389 Other specified disorders of brain: Secondary | ICD-10-CM

## 2019-04-30 DIAGNOSIS — J9622 Acute and chronic respiratory failure with hypercapnia: Secondary | ICD-10-CM

## 2019-04-30 DIAGNOSIS — Z452 Encounter for adjustment and management of vascular access device: Secondary | ICD-10-CM

## 2019-04-30 DIAGNOSIS — I469 Cardiac arrest, cause unspecified: Secondary | ICD-10-CM

## 2019-04-30 DIAGNOSIS — A419 Sepsis, unspecified organism: Secondary | ICD-10-CM

## 2019-04-30 LAB — POCT I-STAT 7, (LYTES, BLD GAS, ICA,H+H)
Acid-Base Excess: 2 mmol/L (ref 0.0–2.0)
Acid-Base Excess: 2 mmol/L (ref 0.0–2.0)
Acid-base deficit: 5 mmol/L — ABNORMAL HIGH (ref 0.0–2.0)
Acid-base deficit: 2 mmol/L (ref 0.0–2.0)
Bicarbonate: 24.6 mmol/L (ref 20.0–28.0)
Bicarbonate: 30.8 mmol/L — ABNORMAL HIGH (ref 20.0–28.0)
Bicarbonate: 26.2 mmol/L (ref 20.0–28.0)
Bicarbonate: 28.5 mmol/L — ABNORMAL HIGH (ref 20.0–28.0)
Calcium, Ion: 1.2 mmol/L (ref 1.15–1.40)
Calcium, Ion: 1.19 mmol/L (ref 1.15–1.40)
Calcium, Ion: 1.3 mmol/L (ref 1.15–1.40)
Calcium, Ion: 1.26 mmol/L (ref 1.15–1.40)
HCT: 27 % — ABNORMAL LOW (ref 36.0–46.0)
HCT: 33 % — ABNORMAL LOW (ref 36.0–46.0)
HCT: 25 % — ABNORMAL LOW (ref 36.0–46.0)
HCT: 26 % — ABNORMAL LOW (ref 36.0–46.0)
Hemoglobin: 9.2 g/dL — ABNORMAL LOW (ref 12.0–15.0)
Hemoglobin: 11.2 g/dL — ABNORMAL LOW (ref 12.0–15.0)
Hemoglobin: 8.5 g/dL — ABNORMAL LOW (ref 12.0–15.0)
Hemoglobin: 8.8 g/dL — ABNORMAL LOW (ref 12.0–15.0)
O2 Saturation: 95 %
O2 Saturation: 100 %
O2 Saturation: 96 %
O2 Saturation: 97 %
Patient temperature: 97.8
Patient temperature: 97.8
Patient temperature: 97.8
Patient temperature: 97.5
Potassium: 4.9 mmol/L (ref 3.5–5.1)
Potassium: 4.5 mmol/L (ref 3.5–5.1)
Potassium: 5.3 mmol/L — ABNORMAL HIGH (ref 3.5–5.1)
Potassium: 4.7 mmol/L (ref 3.5–5.1)
Sodium: 134 mmol/L — ABNORMAL LOW (ref 135–145)
Sodium: 135 mmol/L (ref 135–145)
Sodium: 134 mmol/L — ABNORMAL LOW (ref 135–145)
Sodium: 135 mmol/L (ref 135–145)
TCO2: 27 mmol/L (ref 22–32)
TCO2: 33 mmol/L — ABNORMAL HIGH (ref 22–32)
TCO2: 28 mmol/L (ref 22–32)
TCO2: 30 mmol/L (ref 22–32)
pCO2 arterial: 71 mmHg (ref 32.0–48.0)
pCO2 arterial: 69 mmHg (ref 32.0–48.0)
pCO2 arterial: 59.5 mmHg — ABNORMAL HIGH (ref 32.0–48.0)
pCO2 arterial: 52.6 mmHg — ABNORMAL HIGH (ref 32.0–48.0)
pH, Arterial: 7.144 — CL (ref 7.350–7.450)
pH, Arterial: 7.255 — ABNORMAL LOW (ref 7.350–7.450)
pH, Arterial: 7.249 — ABNORMAL LOW (ref 7.350–7.450)
pH, Arterial: 7.338 — ABNORMAL LOW (ref 7.350–7.450)
pO2, Arterial: 97 mmHg (ref 83.0–108.0)
pO2, Arterial: 345 mmHg — ABNORMAL HIGH (ref 83.0–108.0)
pO2, Arterial: 99 mmHg (ref 83.0–108.0)
pO2, Arterial: 98 mmHg (ref 83.0–108.0)

## 2019-04-30 LAB — GLUCOSE, CAPILLARY
Glucose-Capillary: 106 mg/dL — ABNORMAL HIGH (ref 70–99)
Glucose-Capillary: 72 mg/dL (ref 70–99)
Glucose-Capillary: 79 mg/dL (ref 70–99)
Glucose-Capillary: 85 mg/dL (ref 70–99)
Glucose-Capillary: 95 mg/dL (ref 70–99)

## 2019-04-30 LAB — RENAL FUNCTION PANEL
Albumin: 1.2 g/dL — ABNORMAL LOW (ref 3.5–5.0)
Anion gap: 22 — ABNORMAL HIGH (ref 5–15)
BUN: 52 mg/dL — ABNORMAL HIGH (ref 6–20)
CO2: 23 mmol/L (ref 22–32)
Calcium: 9 mg/dL (ref 8.9–10.3)
Chloride: 92 mmol/L — ABNORMAL LOW (ref 98–111)
Creatinine, Ser: 6.08 mg/dL — ABNORMAL HIGH (ref 0.44–1.00)
GFR calc Af Amer: 9 mL/min — ABNORMAL LOW (ref >60)
GFR calc non Af Amer: 8 mL/min — ABNORMAL LOW (ref >60)
Glucose, Bld: 172 mg/dL — ABNORMAL HIGH (ref 70–99)
Phosphorus: 6 mg/dL — ABNORMAL HIGH (ref 2.5–4.6)
Potassium: 5.1 mmol/L (ref 3.5–5.1)
Sodium: 137 mmol/L (ref 135–145)

## 2019-04-30 LAB — LACTIC ACID, PLASMA
Lactic Acid, Venous: 9.5 mmol/L (ref 0.5–1.9)
Lactic Acid, Venous: 1.6 mmol/L (ref 0.5–1.9)

## 2019-04-30 LAB — AEROBIC/ANAEROBIC CULTURE W GRAM STAIN (SURGICAL/DEEP WOUND)

## 2019-04-30 LAB — CBC
HCT: 28.4 % — ABNORMAL LOW (ref 36.0–46.0)
Hemoglobin: 8.4 g/dL — ABNORMAL LOW (ref 12.0–15.0)
MCH: 30 pg (ref 26.0–34.0)
MCHC: 29.6 g/dL — ABNORMAL LOW (ref 30.0–36.0)
MCV: 101.4 fL — ABNORMAL HIGH (ref 80.0–100.0)
Platelets: 133 10*3/uL — ABNORMAL LOW (ref 150–400)
RBC: 2.8 MIL/uL — ABNORMAL LOW (ref 3.87–5.11)
RDW: 17.1 % — ABNORMAL HIGH (ref 11.5–15.5)
WBC: 14.3 10*3/uL — ABNORMAL HIGH (ref 4.0–10.5)
nRBC: 0.6 % — ABNORMAL HIGH (ref 0.0–0.2)

## 2019-04-30 LAB — VANCOMYCIN, RANDOM: Vancomycin Rm: 19

## 2019-04-30 LAB — PROTIME-INR
INR: 1.3 — ABNORMAL HIGH (ref 0.8–1.2)
Prothrombin Time: 16.1 seconds — ABNORMAL HIGH (ref 11.4–15.2)

## 2019-04-30 LAB — HEPATIC FUNCTION PANEL
ALT: 68 U/L — ABNORMAL HIGH (ref 0–44)
AST: 242 U/L — ABNORMAL HIGH (ref 15–41)
Albumin: 1.1 g/dL — ABNORMAL LOW (ref 3.5–5.0)
Alkaline Phosphatase: 720 U/L — ABNORMAL HIGH (ref 38–126)
Bilirubin, Direct: 1.2 mg/dL — ABNORMAL HIGH (ref 0.0–0.2)
Indirect Bilirubin: 0.6 mg/dL (ref 0.3–0.9)
Total Bilirubin: 1.8 mg/dL — ABNORMAL HIGH (ref 0.3–1.2)
Total Protein: 6.2 g/dL — ABNORMAL LOW (ref 6.5–8.1)

## 2019-04-30 LAB — MAGNESIUM: Magnesium: 2.9 mg/dL — ABNORMAL HIGH (ref 1.7–2.4)

## 2019-04-30 LAB — AMMONIA: Ammonia: 113 umol/L — ABNORMAL HIGH (ref 9–35)

## 2019-04-30 LAB — FLOW CYTOMETRY REQUEST - FLUID (INPATIENT)

## 2019-04-30 LAB — ANGIOTENSIN CONVERTING ENZYME, CSF: Angio Convert Enzyme: <1.5 U/L (ref 0.0–2.5)

## 2019-04-30 MED ORDER — ALBUMIN HUMAN 5 % IV SOLN
25.0000 g | Freq: Once | INTRAVENOUS | Status: DC
  Filled 2019-04-30: qty 250

## 2019-04-30 MED ORDER — SODIUM BICARBONATE 8.4 % IV SOLN
INTRAVENOUS | Status: AC
  Administered 2019-04-30: 06:00:00 50 meq
  Filled 2019-04-30: qty 100

## 2019-04-30 MED ORDER — SODIUM BICARBONATE 8.4 % IV SOLN
INTRAVENOUS | Status: AC
  Administered 2019-04-30: 50 meq
  Filled 2019-04-30: qty 100

## 2019-04-30 MED ORDER — NOREPINEPHRINE 16 MG/250ML-% IV SOLN
0.0000 ug/min | INTRAVENOUS | Status: DC
  Administered 2019-04-30: 10:00:00 35 ug/min via INTRAVENOUS
  Filled 2019-04-30 (×3): qty 250

## 2019-04-30 MED ORDER — SODIUM BICARBONATE 8.4 % IV SOLN
50.0000 meq | Freq: Once | INTRAVENOUS | Status: AC
  Administered 2019-04-30: 03:00:00 50 meq via INTRAVENOUS

## 2019-04-30 MED ORDER — VASOPRESSIN 20 UNIT/ML IV SOLN
0.0300 [IU]/min | INTRAVENOUS | Status: DC
  Administered 2019-04-30: 23:00:00 0.03 [IU]/min via INTRAVENOUS
  Filled 2019-04-30: qty 2

## 2019-04-30 MED ORDER — ATROPINE SULFATE 1 MG/10ML IJ SOSY
PREFILLED_SYRINGE | INTRAMUSCULAR | Status: AC
  Filled 2019-04-30: qty 10

## 2019-04-30 MED ORDER — ALBUTEROL SULFATE (2.5 MG/3ML) 0.083% IN NEBU
2.5000 mg | INHALATION_SOLUTION | Freq: Four times a day (QID) | RESPIRATORY_TRACT | Status: DC | PRN
  Administered 2019-04-30 (×4): 2.5 mg via RESPIRATORY_TRACT
  Filled 2019-04-30 (×5): qty 3

## 2019-04-30 MED ORDER — ALBUMIN HUMAN 25 % IV SOLN
25.0000 g | Freq: Once | INTRAVENOUS | Status: AC
  Administered 2019-04-30: 25 g via INTRAVENOUS

## 2019-04-30 MED ORDER — ACETYLCYSTEINE 20 % IN SOLN
4.0000 mL | Freq: Three times a day (TID) | RESPIRATORY_TRACT | Status: DC
  Filled 2019-04-30: qty 4

## 2019-04-30 MED ORDER — NOREPINEPHRINE 4 MG/250ML-% IV SOLN
INTRAVENOUS | Status: AC
  Filled 2019-04-30: qty 250

## 2019-04-30 MED ORDER — ALBUMIN HUMAN 25 % IV SOLN
25.0000 g | Freq: Once | INTRAVENOUS | Status: AC
  Administered 2019-04-30: 23:00:00 25 g via INTRAVENOUS
  Filled 2019-04-30: qty 100

## 2019-04-30 MED ORDER — ACETYLCYSTEINE 20 % IN SOLN
4.0000 mL | Freq: Four times a day (QID) | RESPIRATORY_TRACT | Status: DC
  Administered 2019-04-30 – 2019-05-01 (×5): 4 mL via RESPIRATORY_TRACT
  Filled 2019-04-30 (×7): qty 4

## 2019-04-30 MED ORDER — IOHEXOL 350 MG/ML SOLN
100.0000 mL | Freq: Once | INTRAVENOUS | Status: AC | PRN
  Administered 2019-04-30: 10:00:00 100 mL via INTRAVENOUS

## 2019-04-30 MED FILL — Medication: Qty: 1 | Status: AC

## 2019-04-30 NOTE — Procedures (Signed)
Central Venous Catheter Insertion Procedure Note Carla Compton [id number] 08-Apr-1981  Procedure: Insertion of Central Venous Catheter Indications: Assessment of intravascular volume, Drug and/or fluid administration and Frequent blood sampling  Procedure Details Consent: Unable to obtain consent because of emergent medical necessity. Time Out: Verified patient identification, verified procedure, site/side was marked, verified correct patient position, special equipment/implants available, medications/allergies/relevent history reviewed, required imaging and test results available.  Performed  Maximum sterile technique was used including antiseptics, cap, gloves, gown, hand hygiene, mask and sheet. Skin prep: Chlorhexidine; local anesthetic administered A antimicrobial bonded/coated triple lumen catheter was placed in the left internal jugular vein using the Seldinger technique.  Evaluation Blood flow good Complications: No apparent complications Patient did tolerate procedure well. Chest X-ray ordered to verify placement.  CXR: normal.  Kristen D Scatliffe 04/30/2019, 8:50 AM

## 2019-04-30 NOTE — Progress Notes (Signed)
[facility]   Reason for visit: Follow up on MRSA bacteremia  Interval History: I have been reconsulted due to a change in her status, PEA arrest and extension of her brain MRI findings of the abnormal diffusion signal and concern for an autoimmune process.  She remains intubated.    Physical Exam: Constitutional:  Vitals:   04/30/19 0700 04/30/19 0819  BP:    Pulse:    Resp:    Temp: (!) 101.5 F (38.6 C)   SpO2:  100%   patient appears in NAD Eyes: left pupil larger than the right HENT: +ET Respiratory: respiratory effort on vent; CTA B Cardiovascular: tachy RR GI: soft, nt, nd Neuro: no response  Review of Systems: Unable to be assessed due to patient factors  Lab Results  Component Value Date   WBC 14.3 (H) 04/30/2019   HGB 8.8 (L) 04/30/2019   HCT 26.0 (L) 04/30/2019   MCV 101.4 (H) 04/30/2019   PLT 133 (L) 04/30/2019    Lab Results  Component Value Date   CREATININE 6.08 (H) 04/30/2019   BUN 52 (H) 04/30/2019   NA 135 04/30/2019   K 4.7 04/30/2019   CL 92 (L) 04/30/2019   CO2 23 04/30/2019    Lab Results  Component Value Date   ALT 68 (H) 04/30/2019   AST 242 (H) 04/30/2019   ALKPHOS 720 (H) 04/30/2019     Microbiology: Recent Results (from the past 240 hour(s))  Aerobic/Anaerobic Culture (surgical/deep wound)     Status: None   Collection Time: 04/25/19 10:37 AM   Specimen: Abscess  Result Value Ref Range Status   Specimen Description ABSCESS RIGHT ILIOPSOAS  Final   Special Requests NONE  Final   Gram Stain   Final    RARE WBC PRESENT, PREDOMINANTLY PMN NO ORGANISMS SEEN    Culture   Final    RARE METHICILLIN RESISTANT STAPHYLOCOCCUS AUREUS NO ANAEROBES ISOLATED Performed at [facility], [address] [place], [place] [address]    Report Status 04/30/2019 FINAL  Final   Organism ID, Bacteria METHICILLIN RESISTANT STAPHYLOCOCCUS AUREUS  Final      Susceptibility   Methicillin resistant  staphylococcus aureus - MIC*    CIPROFLOXACIN >=8 RESISTANT Resistant     ERYTHROMYCIN >=8 RESISTANT Resistant     GENTAMICIN <=0.5 SENSITIVE Sensitive     OXACILLIN >=4 RESISTANT Resistant     TETRACYCLINE <=1 SENSITIVE Sensitive     VANCOMYCIN <=0.5 SENSITIVE Sensitive     TRIMETH/SULFA <=10 SENSITIVE Sensitive     CLINDAMYCIN <=0.25 SENSITIVE Sensitive     RIFAMPIN <=0.5 SENSITIVE Sensitive     Inducible Clindamycin NEGATIVE Sensitive     * RARE METHICILLIN RESISTANT STAPHYLOCOCCUS AUREUS  CSF culture with Stat gram stain     Status: None (Preliminary result)   Collection Time: 04/29/19  4:23 PM   Specimen: CSF; Cerebrospinal Fluid  Result Value Ref Range Status   Specimen Description CSF  Final   Special Requests NONE  Final   Gram Stain   Final    WBC PRESENT,BOTH PMN AND MONONUCLEAR NO ORGANISMS SEEN CYTOSPIN SMEAR    Culture   Final    NO GROWTH < 24 HOURS Performed at [facility], [address], [place], [place] [address]    Report Status PENDING  Incomplete    Impression/Plan:  1. 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  I  agree with neurology that the benefits of steroids outweigh the risk of some immunosuppression in the setting of ongoing treatment of bacteremia, psoas abscess.  Though she is febrile again with an increase in her WBC, some of this may be due to the presumed autoimmune process.  CSF studies not concerning for infection.  Therefore I agree with giving steroids as directed by neurology.  2.  MRSA bacteremia - she continue on vancomycin and blood cultures from 10/25 remained negative.  Psoas abscess drained and with MRSA as well.   3.  Psoas abscess - on antibiotics as above.    Dr. Tommy Medal available over the weekend if needed, otherwise I will folllow up on Monday

## 2019-04-30 NOTE — Progress Notes (Signed)
[facility] NEPHROLOGY PROGRESS NOTE  Assessment/ Plan: Pt is a 38 y.o. 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  Patient decompensated with shock and bradycardia on 10/27 required CRRT from 10/27-10/29.  Cardiac arrest on 04/30/2019.  #MRSA bacteremia: Seen by ID.  TEE negative for vegetation.  On IV vancomycin for total 4 weeks.  # ESRD: MWF at [facility], [place].  -Had a cardiac arrest last night with low blood pressure.  Currently on Levophed with acceptable BP reading.  We will attempt intermittent HD today using albumin and lowering dialysate temperature.  If patient does not tolerate IHD then she will need CRRT.  Discussed with ICU team. AV fistula is working well.     #Septic shock: Back on levo. Antibiotics as above.  Monitor blood pressure.  #Anemia due to sepsis, ESRD: Status post blood transfusion on 11/12.  Continue ESA.  No iron because of sepsis.  Transfuse as needed.  #Metabolic bone disease: Discontinued Renvela because of low phosphorus, continue calcitriol.  Monitor lab.  #Status post PEA arrest on 10/30 and 11/6: Echo with 55 to 60% EF.  Per primary team.Getting CT head.  Overall prognosis looks grim.  #Toxic metabolic encephalopathy: Seen by neurology.  No improvement.  Subjective: Seen and examined in ICU.  Overnight event noted.  Had a cardiac arrest.  Remains intubated, unresponsive.  Blood pressure soft on Levophed. Objective Vital signs in last 24 hours: Vitals:   04/30/19 0500 04/30/19 0600 04/30/19 0700 04/30/19 0819  BP: 92/80 106/66    Pulse: (!) 165 (!) 113    Resp: 16 20    Temp:   (!) 101.5 F (38.6 C)   TempSrc:   Axillary   SpO2: 100% 100%  100%  Weight: 113 kg     Height:       Weight change: 0.4 kg  Intake/Output Summary (Last 24 hours) at 04/30/2019 0938 Last data filed at 04/30/2019 0200 Gross per  24 hour  Intake 1105 ml  Output -  Net 1105 ml       Labs: Basic Metabolic Panel: Recent Labs  Lab 04/29/19 0310 04/29/19 1637 04/30/19 0223  04/30/19 0341 04/30/19 0432 04/30/19 0537  NA 134* 134* 137   < > 135 134* 135  K 4.0 4.5 5.1   < > 4.5 5.3* 4.7  CL 97* 96* 92*  --   --   --   --   CO2 _0 --   --   --   --   GLUCOSE 192* 137* 172*  --   --   --   --   BUN 42* 46* 52*  --   --   --   --   CREATININE 5.07* 5.60* 6.08*  --   --   --   --   CALCIUM 8.4* 8.6* 9.0  --   --   --   --   PHOS 2.4* 2.4* 6.0*  --   --   --   --    < > = values in this interval not displayed.   Liver Function Tests: Recent Labs  Lab 04/23/19 1434  04/29/19 1637 04/30/19 0223 04/30/19 0249  AST 99*  --   --   --  242*  ALT 34  --   --   --  68*  ALKPHOS 654*  --   --   --  720*  BILITOT 3.0*  --   --   --  1.8*  PROT 6.3*  --   --   --  6.2*  ALBUMIN 1.7*   < > 1.3* 1.2* 1.1*   < > = values in this interval not displayed.   No results for input(s): LIPASE, AMYLASE in the last 168 hours. Recent Labs  Lab 04/30/19 0258  AMMONIA 113*   CBC: Recent Labs  Lab 04/26/19 0752  04/27/19 0314 04/28/19 0448 04/29/19 0310  04/30/19 0257 04/30/19 0341 04/30/19 0432 04/30/19 0537  WBC 10.2  --  7.8 8.3 7.9  --  14.3*  --   --   --   NEUTROABS  --   --   --   --  6.1  --   --   --   --   --   HGB 6.8*   < > 7.9* 8.4* 8.1*   < > 8.4* 11.2* 8.5* 8.8*  HCT 23.2*   < > 26.9* 27.2* 26.4*   < > 28.4* 33.0* 25.0* 26.0*  MCV 100.9*  --  100.0 95.8 96.0  --  101.4*  --   --   --   PLT 131*  --  126* 126* 120*  --  133*  --   --   --    < > = values in this interval not displayed.   Cardiac Enzymes: No results for input(s): CKTOTAL, CKMB, CKMBINDEX, TROPONINI in the last 168 hours. CBG: Recent Labs  Lab 04/29/19 1134 04/29/19 1504 04/29/19 1940 04/29/19 2325 04/30/19 0743  GLUCAP 203* 157* 129* 147* 95    Iron Studies: No results for input(s): IRON, TIBC, TRANSFERRIN,  FERRITIN in the last 72 hours. Studies/Results: Mr Brain Wo Contrast  Result Date: 04/28/2019 CLINICAL DATA:  Bacteremia, end stage renal disease, abnormal MRI with interval cardiac arrest EXAM: MRI HEAD WITHOUT CONTRAST TECHNIQUE: Multiplanar, multiecho pulse sequences of the brain and surrounding structures were obtained without intravenous contrast. COMPARISON:  April 23, 2019 FINDINGS: Brain: There is persistent and expanded reduced diffusion along the corpus callosum primarily involving the body with some extension into the genu on the left. New small foci of reduced diffusion are identified adjacent to the left frontal horn near the foramen of Monro, right frontal subcortical white matter, and adjacent to the atrium of the right lateral ventricle. There is no significant mass effect. No evidence of intracranial hemorrhage. Vascular: Major vessel flow voids at the skull base are preserved. Skull and upper cervical spine: Decreased T1 marrow signal, which may reflect hematopoetic marrow. Sinuses/Orbits: Diffuse paranasal sinus mucosal thickening. Orbits are unremarkable. Other: Right mastoid effusion. IMPRESSION: Extension of abnormal diffusion signal involving the corpus callosum with unchanged differential including primary lesion and an immune mediated secondary lesion. Minimal new additional punctate foci of abnormal diffusion, which may reflect the same process or unrelated new infarcts. Electronically Signed   By: Macy Mis M.D.   On: 04/28/2019 13:07   Dg Chest Port 1 View  Result Date: 04/30/2019 CLINICAL DATA:  Central line placement. EXAM: PORTABLE CHEST 1 VIEW COMPARISON:  04/30/2019 earlier. FINDINGS: Endotracheal tube without significant change with tip 1.8 cm above the carina. 2 enteric tubes course into the region of the stomach and off the film as tips are not visualized. Interval placement of left IJ central venous catheter with tip just below the right atrium. This could be  pulled back 3-4 cm. Lungs are somewhat hypoinflated demonstrate bilateral patchy hazy airspace opacification without  significant change likely multifocal infection. No evidence of effusion. No pneumothorax. Mild stable cardiomegaly. Remainder of the exam is unchanged. IMPRESSION: Stable hazy bilateral patchy airspace process likely multifocal infection. Multiple tubes and lines as described. New left IJ central venous catheter with tip over the right atrium. This could be pulled back 3-4 cm. These results will be called to the ordering clinician or representative by the Radiologist Assistant, and communication documented in the PACS or zVision Dashboard. Electronically Signed   By: Marin Olp M.D.   On: 04/30/2019 07:27   Dg Chest Port 1 View  Result Date: 04/30/2019 CLINICAL DATA:  Post CPR EXAM: PORTABLE CHEST 1 VIEW COMPARISON:  04/29/2019 FINDINGS: Support devices are stable. Cardiomegaly. Left lung clear. Patchy airspace disease throughout the right lung, stable. Small right pleural effusion. No pneumothorax. No acute bony abnormality. IMPRESSION: Patchy right lung airspace disease, stable. Small right pleural effusion. Electronically Signed   By: Rolm Baptise M.D.   On: 04/30/2019 03:31   Dg Chest Port 1 View  Result Date: 04/29/2019 CLINICAL DATA:  Respiratory failure EXAM: PORTABLE CHEST 1 VIEW COMPARISON:  04/23/2019 FINDINGS: The endotracheal tube terminates above the carina and at the thoracic inlet. The enteric tubes extend below the left hemidiaphragm. The heart size remains enlarged. There are streaky, hazy bilateral airspace opacities, most notably in the right upper lobe. There is no pneumothorax. A small right-sided pleural effusion is suspected. IMPRESSION: 1. Lines and tubes as above. 2. Persistent interstitial persistent hazy bilateral airspace opacities, greatest within the right upper lobe. Electronically Signed   By: Constance Holster M.D.   On: 04/29/2019 06:09   Dg Fl Guided  Lumbar Puncture  Result Date: 04/29/2019 CLINICAL DATA:  38 year old female in the medical ICU with bacteremia, end-stage renal disease, abnormal brain MRI with progressive corpus callosum lesion since 04/28/2019. Diagnostic lumbar puncture requested. EXAM: DIAGNOSTIC LUMBAR PUNCTURE UNDER FLUOROSCOPIC GUIDANCE FLUOROSCOPY TIME:  Fluoroscopy Time:  0 minutes 12 seconds Radiation Exposure Index (if provided by the fluoroscopic device): Number of Acquired Spot Images: 0 PROCEDURE: Informed consent was obtained from the patient's sister by telephone prior to the procedure, including potential complications of headache, allergy, and pain. A "time-out" was performed. With the patient prone, the lower back was prepped with Betadine. 1% Lidocaine was used for local anesthesia. Lumbar puncture was performed at the L2-L3 level using left sub laminar technique and a 5 inch x 20 gauge needle with return of slightly cloudy CSF with an elevated opening pressure of 41.5 cm water. 22 mL of CSF were obtained for laboratory studies. The needle was withdrawn, direct pressure held and hemostasis noted. The patient tolerated the procedure well and there were no apparent complications. The patient was returned to the inpatient floor in stable condition for continued treatment. IMPRESSION: 1. Fluoroscopic guided lumbar puncture at the L2-L3 level with 22 mL of slightly cloudy CSF obtained for analysis. 2. Elevated opening pressure of 41.5 cm of water, although perhaps this measurement might be affected by the patient's prone positioning as well as mechanical ventilation. Electronically Signed   By: Genevie Ann M.D.   On: 04/29/2019 16:45    Medications: Infusions: . sodium chloride    . sodium chloride    . sodium chloride    . albumin human Stopped (04/30/19 0602)  . feeding supplement (VITAL HIGH PROTEIN) 65 mL/hr at 04/30/19 0200  . norepinephrine    . norepinephrine 30 mcg/min (04/30/19 0715)  . vancomycin Stopped (04/29/19  1900)  Scheduled Medications: . sodium chloride   Intravenous Once  . acetylcysteine  4 mL Nebulization Q6H  . allopurinol  100 mg Per Tube BID  . aspirin  81 mg Per Tube Daily  . atropine      . calcitRIOL  0.5 mcg Per Tube Q M,W,F-HD  . chlorhexidine gluconate (MEDLINE KIT)  15 mL Mouth Rinse BID  . Chlorhexidine Gluconate Cloth  6 each Topical Daily  . Chlorhexidine Gluconate Cloth  6 each Topical Q0600  . Chlorhexidine Gluconate Cloth  6 each Topical Q0600  . darbepoetin (ARANESP) injection - DIALYSIS  60 mcg Intravenous Q Tue-HD  . heparin injection (subcutaneous)  5,000 Units Subcutaneous Q8H  . insulin aspart  0-9 Units Subcutaneous Q4H  . mouth rinse  15 mL Mouth Rinse 10 times per day  . midodrine  10 mg Per Tube Q8H  . multivitamin  1 tablet Oral QHS  . mupirocin ointment   Nasal BID  . pantoprazole sodium  40 mg Per Tube Daily  . rosuvastatin  40 mg Per Tube QHS  . sodium chloride flush  5 mL Intracatheter Q8H    have reviewed scheduled and prn medications.  Physical Exam: General: Sedated, intubated, not responsive  heart:RRR, s1s2 nl Lungs: Coarse breath sound bilateral Abdomen:soft, Non-tender, non-distended Extremities: edema+ Dialysis Access: Left upper extremity AV fistula has good thrill and bruit, able to access.   Sumedh Shinsato Prasad Breannah Kratt 04/30/2019,9:38 AM  LOS: 14 days  Pager: [phone]

## 2019-04-30 NOTE — Significant Event (Addendum)
CARDIAC ARREST  Called by E-link due to pt in Cardiac arrest Upon my arrival pt was already in ROSC O2 sat 83%>> PRVC TV 400 RR 18 FiO2 100% PEEP 7 Poor VTi and VTe on initial evaluation  Per 74M team Pt desaturated and became bradycardic  Difficult to palpate any pulse They started compressions immediately CODE STARTED 2:05 AM ROSC Achieved 2:10 AM Pt received 2 Epinephrine and 1 Bicarb   On review of chart pt had LP at 4 pm. ( examine pt's back and removed dressing small band aid underneath both dry at the site of LP. No obvious hematoma or swelling)  On review of vitals she has had some episodes of hypotension  On evaluation of the Cardiac monitor and past events  Post 1:55 Am pt had a slow desaturation from 94% over a 10 min period HR and vitals were relatively stable until 2:03 bradycardic Reviewed Events on Vent around the same time  Peak pressures increased  and MVe decreased. Alarm history shows Paw increased from 2:02 AM  Most likely patient mucous plugged. RT was able to suction thick secretions via ETT Currently Peak pressures are improved. ABG: 7.144/71/97/24>> Respiratory Acidosis Switched pt to Pressure control for improved synchrony MVe is now approx 9 L  Received 1 amp bicarb Will get a CXR and repeat ABG at 3:30 AM.   Signed Dr Seward Carol [facility] Loc[facility]

## 2019-04-30 NOTE — Progress Notes (Signed)
Spoke with family today (mother and sister) in person about [facility] hospital course.  They now appreciate that Kynzli is now severely ill and showing signs of worsening.  They understand that we will continue to treat her with IV antibiotics and start steroids today but that we are not optimistic about her prognosis.    They noted that the family does not want Nira to suffer but they want to ensure that the family all has the opportunity to say goodbye.  Another sister and the father are hoping to visit with her today.  The family would like an opportunity to discuss Jasia's progress and decide together if her code status should be changed.    The family is planning to touch base with the ICU staff again tomorrow (11/6).  Matilde Haymaker, MD

## 2019-04-30 NOTE — Progress Notes (Signed)
OT Cancellation Note  Patient Details Name: Latera Whillock MRN: [id number] DOB: 10-06-1980   Cancelled Treatment:    Reason Eval/Treat Not Completed: Medical issues which prohibited therapy Per RN pt has coded twice this AM and is not appropriate to continue therapy this date. Will continue to follow as available and appropriate.  Zenovia Jarred, MSOT, OTR/L Behavioral Health OT/ Acute Relief OT [facility] Office: [place] 04/30/2019, 9:56 AM

## 2019-04-30 NOTE — Progress Notes (Signed)
Neurology Progress Note   S:// Seen and examined. Underwent spinal tap during the day which went unremarkable. Had a cardiac arrest overnight later in the night x2.  Short duration.   O:// Current vital signs: BP 106/66   Pulse (!) 113   Temp (!) 101.5 F (38.6 C) (Axillary)   Resp 20   Ht '5\' 2"'  (1.575 m)   Wt 113 kg   LMP 04/20/2019   SpO2 100%   BMI 45.56 kg/m  Vital signs in last 24 hours: Temp:  [98.2 F (36.8 C)-101.5 F (38.6 C)] 101.5 F (38.6 C) (11/06 0700) Pulse Rate:  [55-165] 113 (11/06 0600) Resp:  [15-24] 20 (11/06 0600) BP: (78-119)/(44-80) 106/66 (11/06 0600) SpO2:  [87 %-100 %] 100 % (11/06 0819) Arterial Line BP: (100)/(57) 100/57 (11/06 0600) FiO2 (%):  [30 %-100 %] 100 % (11/06 0819) Weight:  [625 kg] 113 kg (11/06 0500) General: Intubated not sedated HEENT: Normocephalic atraumatic CVS: Regular rate rhythm Respiratory: Intubated, breathing over the ventilator Abdomen: Obese, nontender Extremities: Bilateral edema Neurological exam Intubated, not sedated. No spontaneous movement. Does not open eyes spontaneously. To noxious stimulation, mild opening of the left eyelid. Pupils 5 mm fixed. Positive corneals Positive breathing over the vent. No response to noxious stimulation on any of the extremities. Exam is much worse than the prior exam yesterday.  Medications  Current Facility-Administered Medications:  .  0.9 %  sodium chloride infusion (Manually program via Guardrails IV Fluids), , Intravenous, Once, Ollis, Brandi L, NP, Stopped at 04/26/19 1600 .  0.9 %  sodium chloride infusion, 250 mL, Intravenous, Continuous, Desai, Rahul P, PA-C .  0.9 %  sodium chloride infusion, 100 mL, Intravenous, PRN, Rosita Fire, MD .  0.9 %  sodium chloride infusion, 100 mL, Intravenous, PRN, Rosita Fire, MD .  acetaminophen (TYLENOL) tablet 650 mg, 650 mg, Per Tube, Q6H PRN **OR** acetaminophen (TYLENOL) suppository 650 mg, 650 mg,  Rectal, Q6H PRN, Spero Geralds, MD .  acetylcysteine (MUCOMYST) 20 % nebulizer / oral solution 4 mL, 4 mL, Nebulization, Q6H, Scatliffe, Kristen D, MD, 4 mL at 04/30/19 0819 .  albumin human 5 % solution 25 g, 25 g, Intravenous, Once, Scatliffe, Rise Paganini, MD, Stopped at 04/30/19 0602 .  albuterol (PROVENTIL) (2.5 MG/3ML) 0.083% nebulizer solution 2.5 mg, 2.5 mg, Nebulization, Q6H PRN, Scatliffe, Kristen D, MD, 2.5 mg at 04/30/19 0819 .  allopurinol (ZYLOPRIM) tablet 100 mg, 100 mg, Per Tube, BID, Spero Geralds, MD, 100 mg at 04/29/19 2248 .  alteplase (CATHFLO ACTIVASE) injection 2 mg, 2 mg, Intracatheter, Once PRN, Rosita Fire, MD .  aspirin chewable tablet 81 mg, 81 mg, Per Tube, Daily, Agarwala, Einar Grad, MD, 81 mg at 04/29/19 0947 .  atropine 1 MG/10ML injection, , , ,  .  calcitRIOL (ROCALTROL) capsule 0.5 mcg, 0.5 mcg, Per Tube, Q M,W,F-HD, Spero Geralds, MD, 0.5 mcg at 04/28/19 1401 .  chlorhexidine gluconate (MEDLINE KIT) (PERIDEX) 0.12 % solution 15 mL, 15 mL, Mouth Rinse, BID, Agarwala, Ravi, MD, 15 mL at 04/29/19 2053 .  Chlorhexidine Gluconate Cloth 2 % PADS 6 each, 6 each, Topical, Daily, Nahser, Wonda Cheng, MD, 6 each at 04/28/19 1506 .  Chlorhexidine Gluconate Cloth 2 % PADS 6 each, 6 each, Topical, Q0600, Rosita Fire, MD, 6 each at 04/30/19 0541 .  Chlorhexidine Gluconate Cloth 2 % PADS 6 each, 6 each, Topical, Q0600, Rosita Fire, MD, 6 each at 04/30/19 0541 .  Darbepoetin Alfa (ARANESP)  injection 60 mcg, 60 mcg, Intravenous, Q Tue-HD, Nahser, Wonda Cheng, MD, Stopped at 04/20/19 1304 .  feeding supplement (VITAL HIGH PROTEIN) liquid 1,000 mL, 1,000 mL, Per Tube, Continuous, Spero Geralds, MD, Last Rate: 65 mL/hr at 04/30/19 0200 .  fentaNYL (SUBLIMAZE) injection 25 mcg, 25 mcg, Intravenous, Q2H PRN, Agarwala, Ravi, MD, 25 mcg at 04/25/19 2222 .  heparin injection 1,000 Units, 1,000 Units, Dialysis, PRN, Rosita Fire, MD .  heparin injection  5,000 Units, 5,000 Units, Subcutaneous, Q8H, Spero Geralds, MD, 5,000 Units at 04/29/19 2248 .  insulin aspart (novoLOG) injection 0-9 Units, 0-9 Units, Subcutaneous, Q4H, Deterding, Guadelupe Sabin, MD, 1 Units at 04/30/19 0040 .  lidocaine (PF) (XYLOCAINE) 1 % injection 5 mL, 5 mL, Intradermal, PRN, Rosita Fire, MD .  lidocaine-prilocaine (EMLA) cream 1 application, 1 application, Topical, PRN, Rosita Fire, MD .  MEDLINE mouth rinse, 15 mL, Mouth Rinse, 10 times per day, Kipp Brood, MD, 15 mL at 04/30/19 0540 .  midodrine (PROAMATINE) tablet 10 mg, 10 mg, Per Tube, Q8H, Rush Farmer, MD, 10 mg at 04/29/19 2247 .  multivitamin (RENA-VIT) tablet 1 tablet, 1 tablet, Oral, QHS, Spero Geralds, MD, 1 tablet at 04/29/19 2248 .  mupirocin ointment (BACTROBAN) 2 %, , Nasal, BID, Kipp Brood, MD, 1 application at 42/59/56 2247 .  norepinephrine (LEVOPHED) 16 mg in 284m premix infusion, 0-40 mcg/min, Intravenous, Titrated, Yacoub, WLloyd Huger MD .  norepinephrine (LEVOPHED) 4-5 MG/250ML-% infusion SOLN, , , , , Last Rate: 112.5 mL/hr at 04/30/19 0715, 30 mcg/min at 04/30/19 0715 .  ondansetron (ZOFRAN) tablet 4 mg, 4 mg, Per Tube, Q6H PRN **OR** ondansetron (ZOFRAN) injection 4 mg, 4 mg, Intravenous, Q6H PRN, DSpero Geralds MD .  pantoprazole sodium (PROTONIX) 40 mg/20 mL oral suspension 40 mg, 40 mg, Per Tube, Daily, Millen, Jessica B, RPH, 40 mg at 04/29/19 1017 .  pentafluoroprop-tetrafluoroeth (GEBAUERS) aerosol 1 application, 1 application, Topical, PRN, BRosita Fire MD .  rosuvastatin (CRESTOR) tablet 40 mg, 40 mg, Per Tube, QHS, DSpero Geralds MD, 40 mg at 04/29/19 2248 .  sodium chloride flush (NS) 0.9 % injection 5 mL, 5 mL, Intracatheter, Q8H, HAnselm Pancoast Adam, MD, 5 mL at 04/30/19 0556 .  vancomycin (VANCOCIN) IVPB 1000 mg/200 mL premix, 1,000 mg, Intravenous, Q M,W,F-HD, BRomona Curls RPH, Stopped at 04/29/19 1900 Labs CBC    Component Value Date/Time    WBC 14.3 (H) 04/30/2019 0257   RBC 2.80 (L) 04/30/2019 0257   HGB 8.8 (L) 04/30/2019 0537   HCT 26.0 (L) 04/30/2019 0537   PLT 133 (L) 04/30/2019 0257   MCV 101.4 (H) 04/30/2019 0257   MCH 30.0 04/30/2019 0257   MCHC 29.6 (L) 04/30/2019 0257   RDW 17.1 (H) 04/30/2019 0257   LYMPHSABS 1.0 04/29/2019 0310   MONOABS 0.5 04/29/2019 0310   EOSABS 0.2 04/29/2019 0310   BASOSABS 0.0 04/29/2019 0310    CMP     Component Value Date/Time   NA 135 04/30/2019 0537   NA 139 08/05/2018 1231   K 4.7 04/30/2019 0537   CL 92 (L) 04/30/2019 0223   CO2 23 04/30/2019 0223   GLUCOSE 172 (H) 04/30/2019 0223   BUN 52 (H) 04/30/2019 0223   BUN 36 (H) 08/05/2018 1231   CREATININE 6.08 (H) 04/30/2019 0223   CALCIUM 9.0 04/30/2019 0223   PROT 6.2 (L) 04/30/2019 0249   ALBUMIN 1.1 (L) 04/30/2019 0249   AST 242 (H) 04/30/2019 0249  ALT 68 (H) 04/30/2019 0249   ALKPHOS 720 (H) 04/30/2019 0249   BILITOT 1.8 (H) 04/30/2019 0249   GFRNONAA 8 (L) 04/30/2019 0223   GFRAA 9 (L) 04/30/2019 0223   LP results Glucose 99, protein 21, RBC 183, WBC 2.  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  Assessment: 38 year old woman with past history of rheumatoid arthritis on Plaquenil, came in as a transfer from outside hospital for MRSA bacteremia and further management found to have a right psoas abscess which is status post drainage. Remains encephalopathic. Also had a brief PEA arrest on 04/23/2019 and again on overnight 04/30/2019. MRI with a cytotoxic lesion of the corpus callosum with a broad differential that includes demyelination, primary lesion, autoimmune, inflammatory and/or less likely malignant.  Other differentials include hemolytic uremic  syndrome.  Less likely vasculitides but cannot rule them out completely. Had another cardiac arrest overnight  From a neurological standpoint, what ever the MRI lesion etiology might be most of the etiologies other than an infectious etiology, which with the CSF analysis it does not look like she has, would be treated with steroids.  CSF also is not very convincing for inflammatory etiology.  Given MRSA bacteremia, using steroids will be a very tricky situation.  Exam today is concerning because she has no pupillary responses after the cardiac arrest this morning. Stat imaging has been ordered  Impression: Cytotoxic lesion of corpus callosum-broad differential-autoimmune versus laboratory versus malignant versus demyelination versus vasculitic. Persistent encephalopathy-multifactorial-toxic metabolic versus hypoxic/anoxic due to multiple cardiac arrests and bacteremia.   Recommendations: Stat CT head to evaluate for any evidence of herniation. If that is unremarkable, she will need a CTA head and neck-which I have ordered Continue management of toxic metabolic derangements per primary team as you are. Eventually, she will need treatment with steroids for the cytotoxic lesion in the corpus callosum-which needs to be discussed with infectious diseases in detail given the MRSA bacteremia and also with PCCM given the critical medical condition she is in. I discussed my plan in detail with the primary team resident Dr. Pilar Plate in the unit. I will follow-up on the imaging.  -- Amie Portland, MD [facility] Pager: [phone] If 7pm to 7am, please call on call as listed on AMION.     Addendum Spoke with ID regarding right clavicle highly suspicious for osteomyelitis with extensive surrounding abnormal soft tissue likely reflecting phlegmonous change, also is right sternocleidomastoid myositis with contiguous extensive phlegmonous change in the right lower back and extensive bilateral  airspace disease...  ID is on board in case IV steroids-high-dose need to be used.  Shortly after that, I spoke in detail with the mother and daughter of the patient. I explained to them the critical condition in which she is at this time with all her body systems. I also explained to them that I can offer IV steroids but with multiple infections and bacteremia, that might be very risky.  That might help the brain, arguably but can be detrimental to other organs.  She is already had 3 cardiac arrests and I am not sure how she will progress in the next few days. The family wants to discuss the treatment options amongst them and want to hold using steroids for at least today and make a  decision on it tomorrow. I will continue to follow her with you.   CRITICAL CARE ATTESTATION Performed by: Amie Portland, MD Total critical care time: 69mnutes Critical care time was exclusive of separately billable procedures and treating other patients and/or supervising APPs/Residents/Students Critical care was necessary to treat or prevent imminent or life-threatening deterioration due to multifactorial toxic metabolic encephalopathy This patient is critically ill and at significant risk for neurological worsening and/or death and care requires constant monitoring. Critical care was time spent personally by me on the following activities: development of treatment plan with patient and/or surrogate as well as nursing, discussions with consultants, evaluation of patient's response to treatment, examination of patient, obtaining history from patient or surrogate, ordering and performing treatments and interventions, ordering and review of laboratory studies, ordering and review of radiographic studies, pulse oximetry, re-evaluation of patient's condition, participation in multidisciplinary rounds and medical decision making of high complexity in the care of this patient.

## 2019-04-30 NOTE — Progress Notes (Addendum)
NAME:  Carla Compton, MRN:  [id number], DOB:  1981-05-25, LOS: 56 ADMISSION DATE:  04/05/2019, CONSULTATION DATE:  04/30/2019 REFERRING MD:  Dr. Karleen Hampshire, CHIEF COMPLAINT:  Septic shock  History of present illness   Carla Compton is a 38 y.o. F with PMH of recently diagnosed RLE DVT on Xarelto, ESRD, Type 2 DM, Rheumatoid arthritis, HTN and HL who was initially admitted in [place] 10/19 for septic shock with 4/4 BC grew MRSA and she was transferred to [facility] on 10/23 for ID consult and further management.  admitted to ICU for septic shock. On 10/30 underwent PEA arrest  With ROSC after 5 minutes of CPR requiring emergent intubation. Has been persistently obtunded since arrest.   11/6-PEA arrest, CPR 5 minutes, epinephrine and bicarb given.  Most likely secondary to mucous plug.  Significant Hospital Events   Admitted 10/23 PEA arrest 10/30 CT guided drain on 11/1   Consults:  Nephrology ID IR Ortho Neurology 04/27/2019  Procedures:  ETT 10/30>> trialysis 10/27>> CT guided drainage of Psoas abscess on 11/1  Significant Diagnostic Tests:  10/24 CT head>>No acute findings  10/24 CXR>>Cardiomegaly with mild vascular congestion. Pneumonia is not Excluded. 10/27 CT RLE>> possible right foot osteomyelitis 10/30MRI head>>no thromboembolic disease [id number]: Transesophageal echocardiogram no evidence of vegetation. 10/30 MRI hip with psoas abscess 12/26/2018 MRI head pending>>  Micro Data:  MRSA PCR positive 10/26 Blood cultures negative since admission 11/01 psoas abscess cultures growing staph.   Antimicrobials:  On vancomycin per ID  Interim history/subjective:  PEA arrest early this morning at 2 AM.  CPR for 5 minutes, epinephrine and bicarb given.  Based on assessment by the critical care physician overnight, most likely secondary to mucous plug.  Objective   Blood pressure 106/66, pulse (!) 113, temperature 98.7 F (37.1 C), temperature source Axillary,  resp. rate 20, height 5\' 2"  (1.575 m), weight 113 kg, last menstrual period 04/20/2019, SpO2 100 %.    Vent Mode: PRVC FiO2 (%):  [30 %-100 %] 100 % Set Rate:  [18 bmp-22 bmp] 20 bmp Vt Set:  [400 mL-500 mL] 500 mL PEEP:  [5 cmH20] 5 cmH20 Pressure Support:  [10 cmH20-15 cmH20] 15 cmH20 Plateau Pressure:  [19 cmH20-21 cmH20] 19 cmH20   Intake/Output Summary (Last 24 hours) at 04/30/2019 0714 Last data filed at 04/30/2019 0200 Gross per 24 hour  Intake 1235 ml  Output -  Net 1235 ml   Filed Weights   04/28/19 0457 04/29/19 0347 04/30/19 0500  Weight: 111.2 kg 112.6 kg 113 kg    Examination: General: Intubated and ventilated without sedation.  Unresponsive.  Opens eyes to painful stimuli. HEENT: Eyes are fixed and dilated.  Moist mucous membranes. Cardio: Distant heart sounds.  Tachycardia.  Palpable thrill left wrist.   Pulm: Coarse breath sounds diffusely. Abdomen: Bowel sounds normal. Abdomen soft and non-tender.  Extremities: No significant LE edema.  Palpable thrill of left wrist.   Neuro: Fixed and dilated pupils.  Unresponsive. Skin: 2 x 2 centimeter raised mass over her right clavicle with serosanguineous discharge.  Surrounding erythema and ecchymosis noted.   Assessment & Plan:   Toxic Metabolic Encephalopathy-worsening Mental status appears worsened following PEA arrest overnight. -Neurology following, appreciate recommendations -May benefit from steroids although this may worsen her MRSA bacteremia  Brain lesions, concern for cerebral herniation Worsened mental status this morning.  Pupils fixed and dilated.  Stat head CT ordered.  With regard to corpus callosum lesion, neurology is concern for autoimmune pathology.  This would  require course of steroids which is risky given her poorly controlled MRSA bacteremia.  Will perform CTA brain today to assess for possible vasculopathy. -Neurology following, appreciate recommendations -plan to initiate steroids today  -Follow-up CTA brain  Goals of care Given her worsening mental status, potentially poorly controlled infection and possible worsening cerebral lesion, it would be appropriate to discuss goals of care with family.  At this point, she is a full code and has experienced PEA arrest twice this hospitalization.  Despite medical interventions so far she appears to be worsening.  Prognosis is guarded at this time.  Her mother was called and notified of her progress this morning.  Her mother plans to come in to see her today for further goals of care discussion.  MRSA bacteremia Deep wound culture from 11/1 growing MRSA.  Blood cultures obtained 10/25 showed no growth final read.  TEE on 10/26 shows no vegetations although new punctate foci noted above are concerning for septic emboli.  Possible abscess noted in right upper chest on exam this morning.  General surgery consulted for possible I&D.  CT chest ordered for assessment possible soft tissue infection. - Vancomycin per pharmacy, for 4 weeks - Psoas abscess drained by IR on 11/4, IR following for removal -Follow-up CT chest -Follow-up surgery recommendations regarding I&D  Acute hypoxemic respiratory failure Currently off sedatives.  Awake and unresponsive.  Mental status is largest barrier to extubation. -Mental status is a block to weaning  ESRD -Nephrology following  Septic/Circulatory Shock - improved Off IV pressors.  Maintaining maps from 62-88 in the past 24 hours.  Stable vitals in the past 24 hours - midodrine 10 mg 3 times daily  S/P PEA arrest on 10/30 and again on 11/6 Appreciate neurology's input MRI has been ordered and neurology aware  Continue to monitor  Acute on chronic blood loss anemia  8.1 today from 8.4 on 11/4. -Continue to monitor -Transfuse for hemoglobin under 7  Seropositive RA - on plaquenil at home? Monitor  Best practice:  Diet: tube feeds Pain/Anxiety/Delirium protocol (if indicated): on fentanyl for  pain control VAP protocol (if indicated): yes DVT prophylaxis: [place] adding GI prophylaxis: PPI Glucose control: <180 Mobility: bed rest due to shock Code Status: Full Family Communication: Plan to updated bedside Disposition: needs ICU  Labs   CBC: Recent Labs  Lab 04/26/19 0752  04/27/19 0314 04/28/19 0448 04/29/19 0310 04/30/19 0225 04/30/19 0257 04/30/19 0341 04/30/19 0432 04/30/19 0537  WBC 10.2  --  7.8 8.3 7.9  --  14.3*  --   --   --   NEUTROABS  --   --   --   --  6.1  --   --   --   --   --   HGB 6.8*   < > 7.9* 8.4* 8.1* 9.2* 8.4* 11.2* 8.5* 8.8*  HCT 23.2*   < > 26.9* 27.2* 26.4* 27.0* 28.4* 33.0* 25.0* 26.0*  MCV 100.9*  --  100.0 95.8 96.0  --  101.4*  --   --   --   PLT 131*  --  126* 126* 120*  --  133*  --   --   --    < > = values in this interval not displayed.    Basic Metabolic Panel: Recent Labs  Lab 04/27/19 0314 04/27/19 1847 04/28/19 0448 04/28/19 1436 04/29/19 0310 04/29/19 1637 04/30/19 0223 04/30/19 0225 04/30/19 0341 04/30/19 0432 04/30/19 0537  NA 139 132* 137 138 134* 134* 137 134*  135 134* 135  K 3.5 3.6 3.7 3.8 4.0 4.5 5.1 4.9 4.5 5.3* 4.7  CL 101 95* 100 101 97* 96* 92*  --   --   --   --   CO2 27 23 25 25 24 26 23   --   --   --   --   GLUCOSE 97 159* 165* 164* 192* 137* 172*  --   --   --   --   BUN 23* 31* 35* 40* 42* 46* 52*  --   --   --   --   CREATININE 3.64* 4.31* 4.65* 5.04* 5.07* 5.60* 6.08*  --   --   --   --   CALCIUM 8.3* 8.1* 8.2* 8.3* 8.4* 8.6* 9.0  --   --   --   --   MG 2.0 2.1 2.1  --  2.2  --  2.9*  --   --   --   --   PHOS 4.1  4.1 3.9 3.2  3.1 3.0 2.4* 2.4* 6.0*  --   --   --   --    GFR: Estimated Creatinine Clearance: 15.2 mL/min (A) (by C-G formula based on SCr of 6.08 mg/dL (H)). Recent Labs  Lab 04/27/19 0314 04/28/19 0448 04/29/19 0310 04/30/19 0247 04/30/19 0257  WBC 7.8 8.3 7.9  --  14.3*  LATICACIDVEN  --   --   --  9.5*  --     Liver Function Tests: Recent Labs  Lab 04/23/19 1434   04/28/19 1436 04/29/19 0310 04/29/19 1637 04/30/19 0223 04/30/19 0249  AST 99*  --   --   --   --   --  242*  ALT 34  --   --   --   --   --  68*  ALKPHOS 654*  --   --   --   --   --  720*  BILITOT 3.0*  --   --   --   --   --  1.8*  PROT 6.3*  --   --   --   --   --  6.2*  ALBUMIN 1.7*   < > 1.3* 1.3* 1.3* 1.2* 1.1*   < > = values in this interval not displayed.   No results for input(s): LIPASE, AMYLASE in the last 168 hours. Recent Labs  Lab 04/30/19 0258  AMMONIA 113*    ABG    Component Value Date/Time   PHART 7.338 (L) 04/30/2019 0537   PCO2ART 52.6 (H) 04/30/2019 0537   PO2ART 98.0 04/30/2019 0537   HCO3 28.5 (H) 04/30/2019 0537   TCO2 30 04/30/2019 0537   ACIDBASEDEF 2.0 04/30/2019 0432   O2SAT 97.0 04/30/2019 0537     Coagulation Profile: Recent Labs  Lab 04/25/19 0702 04/30/19 0257  INR 1.3* 1.3*    Cardiac Enzymes: No results for input(s): CKTOTAL, CKMB, CKMBINDEX, TROPONINI in the last 168 hours.  HbA1C: Hgb A1c MFr Bld  Date/Time Value Ref Range Status  04/17/2019 03:20 AM 6.9 (H) 4.8 - 5.6 % Final    Comment:    (NOTE) Pre diabetes:          5.7%-6.4% Diabetes:              >6.4% Glycemic control for   <7.0% adults with diabetes   05/26/2018 04:57 AM 7.2 (H) 4.8 - 5.6 % Final    Comment:    (NOTE) Pre diabetes:  5.7%-6.4% Diabetes:              >6.4% Glycemic control for   <7.0% adults with diabetes     CBG: Recent Labs  Lab 04/29/19 0738 04/29/19 1134 04/29/19 1504 04/29/19 1940 04/29/19 2325  GLUCAP 188* 203* 157* 129* 147*    Matilde Haymaker, MD  PGY-2 Family Medicine Resident  Attending Note:  38 year old with RA on multiple medications, DVT, ESRD-HD, NIDDM and septic shock with MRSA bacteremia and multiple areas of infection who presents to PCCM with respiratory failure.  Overnight, cardiac arrest x2.  Now on exam, maxed on pressors and pupils are non-reactive.  CT of the brain was done and neurology consulted.   Concern for RA related brain tissue changes.  I am concerned with her neuro exam about her ability to survive this admission.  I do not believe patient will do well.  Family is to arrive today for a conference.  ID consulted to see if steroids are an acceptable option and neurology following on that.  PCCM will continue to manage.  Try iHD today, if unsuccessful then HD.  The patient is critically ill with multiple organ systems failure and requires high complexity decision making for assessment and support, frequent evaluation and titration of therapies, application of advanced monitoring technologies and extensive interpretation of multiple databases.   Critical Care Time devoted to patient care services described in this note is  34  Minutes. This time reflects time of care of this signee Dr Jennet Maduro. This critical care time does not reflect procedure time, or teaching time or supervisory time of PA/NP/Med student/Med Resident etc but could involve care discussion time.  Rush Farmer, M.D. [facility] Pulmonary/Critical Care Medicine.

## 2019-04-30 NOTE — Progress Notes (Signed)
Patient ID: Carla Compton, female   DOB: 11-19-1980, 38 y.o.   MRN: [id number]     Carla Compton 05/24/1981  [id number].    Requesting MD: Dr. Jennet Maduro Chief Complaint/Reason for Consult: neck abscess  HPI:  This is a 38 yo female who was admitted with MRSA bacteremia and ultimately has become very ill requiring intubation.  At baseline she has ESRD on HD, CHF, DM, and HTN.  During her stay in the ICU, at one time had a central line in place on the right side of her neck.  It was removed some time ago and appears to have some drainage from the site.  Unfortunately, in the last 24 hrs the patient has had multiple code events as well as a significant decline in her neurologic status.  We have been asked to see her for a possible neck abscess from prior central line.  ROS: ROS: unable to obtain due to critical illness  Family History  Problem Relation Age of Onset   Mitral valve prolapse Mother    Hypertension Mother    Hypertension Father    Diabetes Father    Diabetes Maternal Grandmother     Past Medical History:  Diagnosis Date   Anemia    2019   Benign essential HTN    CHF (congestive heart failure) (HCC)    CKD (chronic kidney disease) stage 3, GFR 30-59 ml/min    Diabetes ([place])     Past Surgical History:  Procedure Laterality Date   BREAST SURGERY     reduction, 2005   RENAL BIOPSY  2018   TEE WITHOUT CARDIOVERSION N/A 04/14/2019   Procedure: TRANSESOPHAGEAL ECHOCARDIOGRAM (TEE);  Surgeon: Acie Fredrickson Wonda Cheng, MD;  Location: [facility] ENDOSCOPY;  Service: Cardiovascular;  Laterality: N/A;    Social History:  reports that she has never smoked. She has never used smokeless tobacco. She reports that she does not drink alcohol or use drugs.  Allergies:  Allergies  Allergen Reactions   Contrast Media [Iodinated Diagnostic Agents] Other (See Comments)    Affected her kidneys   Pepto-Bismol [Bismuth] Other (See Comments)    Unknown reaction     Medications Prior to Admission  Medication Sig Dispense Refill   albuterol (PROAIR HFA) 108 (90 Base) MCG/ACT inhaler Inhale 2 puffs into the lungs every 4 (four) hours as needed for wheezing or shortness of breath.     allopurinol (ZYLOPRIM) 100 MG tablet TAKE 1 TABLET BY MOUTH TWICE A DAY (Patient taking differently: Take 100 mg by mouth 2 (two) times daily. ) 60 tablet 6   aspirin EC 81 MG EC tablet Take 1 tablet (81 mg total) by mouth daily. 30 tablet 0   calcitRIOL (ROCALTROL) 0.25 MCG capsule Take 1 capsule (0.25 mcg total) by mouth every Monday, Wednesday, and Friday. 30 capsule 0   carvedilol (COREG) 25 MG tablet Take 1 tablet (25 mg total) by mouth 2 (two) times daily with a meal. 60 tablet 0   cloNIDine (CATAPRES) 0.2 MG tablet Take 0.2 mg by mouth at bedtime.     ferric citrate (AURYXIA) 1 GM 210 MG(Fe) tablet Take 210 mg by mouth 3 (three) times daily with meals.     gabapentin (NEURONTIN) 300 MG capsule Take 300 mg by mouth every 8 (eight) hours.     hydrALAZINE (APRESOLINE) 50 MG tablet Take 1 tablet (50 mg total) by mouth 3 (three) times daily. 90 tablet 1   Insulin Glargine (LANTUS SOLOSTAR) 100 UNIT/ML Solostar Pen Inject 20 Units  into the skin daily.     insulin lispro (HUMALOG KWIKPEN) 100 UNIT/ML KwikPen Inject 10 Units into the skin 3 (three) times daily.     levofloxacin (LEVAQUIN) 750 MG tablet Take 750 mg by mouth every other day.     metolazone (ZAROXOLYN) 2.5 MG tablet Take 2.5 mg by mouth once a week.     oxyCODONE-acetaminophen (PERCOCET/ROXICET) 5-325 MG tablet Take 1 tablet by mouth every 4 (four) hours as needed (pain).      rosuvastatin (CRESTOR) 40 MG tablet Take 40 mg by mouth at bedtime.  1   torsemide (DEMADEX) 100 MG tablet Take 1 tablet (100 mg total) by mouth 2 (two) times daily. 60 tablet 0     Physical Exam: Blood pressure 106/66, pulse (!) 113, temperature (!) 101.5 F (38.6 C), temperature source Axillary, resp. rate 20, height  5\' 2"  (1.575 m), weight 113 kg, last menstrual period 03/29/2019, SpO2 100 %. Gen: critically ill on the vent HEENT: pupils dilated and appear nonreactive, no corneal reflex noted on my exam.  Neck with small area that is spontaneously draining some cloudy bloody drainage from an old line site.  There is a small amount of induration around this site. Heart: tachy, but regular.  + radial pulses.  Edema noted of BUE Lungs: on vent, BS present and equal Psych: unable due to critical illness  Results for orders placed or performed during the hospital encounter of 04/18/2019 (from the past 48 hour(s))  Glucose, capillary     Status: Abnormal   Collection Time: 04/28/19 12:52 PM  Result Value Ref Range   Glucose-Capillary 148 (H) 70 - 99 mg/dL  Renal function panel (daily at 1600)     Status: Abnormal   Collection Time: 04/28/19  2:36 PM  Result Value Ref Range   Sodium 138 135 - 145 mmol/L   Potassium 3.8 3.5 - 5.1 mmol/L   Chloride 101 98 - 111 mmol/L   CO2 25 22 - 32 mmol/L   Glucose, Bld 164 (H) 70 - 99 mg/dL   BUN 40 (H) 6 - 20 mg/dL   Creatinine, Ser 5.04 (H) 0.44 - 1.00 mg/dL   Calcium 8.3 (L) 8.9 - 10.3 mg/dL   Phosphorus 3.0 2.5 - 4.6 mg/dL   Albumin 1.3 (L) 3.5 - 5.0 g/dL   GFR calc non Af Amer 10 (L) >60 mL/min   GFR calc Af Amer 12 (L) >60 mL/min   Anion gap 12 5 - 15    Comment: Performed at [facility], [address], [place], [place] [address]  Glucose, capillary     Status: Abnormal   Collection Time: 04/28/19  3:08 PM  Result Value Ref Range   Glucose-Capillary 141 (H) 70 - 99 mg/dL  Glucose, capillary     Status: Abnormal   Collection Time: 04/28/19  7:35 PM  Result Value Ref Range   Glucose-Capillary 164 (H) 70 - 99 mg/dL   Comment 1 Notify RN    Comment 2 Document in Chart   Glucose, capillary     Status: Abnormal   Collection Time: 04/28/19 11:48 PM  Result Value Ref Range   Glucose-Capillary 166 (H) 70 - 99 mg/dL   Comment 1 Notify RN    Comment 2  Document in Chart   Renal function panel (daily at 0500)     Status: Abnormal   Collection Time: 04/29/19  3:10 AM  Result Value Ref Range   Sodium 134 (L) 135 - 145 mmol/L  Potassium 4.0 3.5 - 5.1 mmol/L   Chloride 97 (L) 98 - 111 mmol/L   CO2 24 22 - 32 mmol/L   Glucose, Bld 192 (H) 70 - 99 mg/dL   BUN 42 (H) 6 - 20 mg/dL   Creatinine, Ser 5.07 (H) 0.44 - 1.00 mg/dL   Calcium 8.4 (L) 8.9 - 10.3 mg/dL   Phosphorus 2.4 (L) 2.5 - 4.6 mg/dL   Albumin 1.3 (L) 3.5 - 5.0 g/dL   GFR calc non Af Amer 10 (L) >60 mL/min   GFR calc Af Amer 12 (L) >60 mL/min   Anion gap 13 5 - 15    Comment: Performed at [facility] [address], [place], [place] [address]  CBC with Differential/Platelet     Status: Abnormal   Collection Time: 04/29/19  3:10 AM  Result Value Ref Range   WBC 7.9 4.0 - 10.5 K/uL   RBC 2.75 (L) 3.87 - 5.11 MIL/uL   Hemoglobin 8.1 (L) 12.0 - 15.0 g/dL   HCT 26.4 (L) 36.0 - 46.0 %   MCV 96.0 80.0 - 100.0 fL   MCH 29.5 26.0 - 34.0 pg   MCHC 30.7 30.0 - 36.0 g/dL   RDW 17.2 (H) 11.5 - 15.5 %   Platelets 120 (L) 150 - 400 K/uL   nRBC 0.0 0.0 - 0.2 %   Neutrophils Relative % 77 %   Neutro Abs 6.1 1.7 - 7.7 K/uL   Lymphocytes Relative 13 %   Lymphs Abs 1.0 0.7 - 4.0 K/uL   Monocytes Relative 6 %   Monocytes Absolute 0.5 0.1 - 1.0 K/uL   Eosinophils Relative 3 %   Eosinophils Absolute 0.2 0.0 - 0.5 K/uL   Basophils Relative 0 %   Basophils Absolute 0.0 0.0 - 0.1 K/uL   Immature Granulocytes 1 %   Abs Immature Granulocytes 0.08 (H) 0.00 - 0.07 K/uL    Comment: Performed at [facility] [address], [place], [place] [address]  Magnesium     Status: None   Collection Time: 04/29/19  3:10 AM  Result Value Ref Range   Magnesium 2.2 1.7 - 2.4 mg/dL    Comment: Performed at [facility] [address], [place], [place] [address]  Glucose, capillary     Status: Abnormal   Collection Time: 04/29/19  3:20 AM  Result Value Ref Range   Glucose-Capillary  177 (H) 70 - 99 mg/dL  Glucose, capillary     Status: Abnormal   Collection Time: 04/29/19  7:38 AM  Result Value Ref Range   Glucose-Capillary 188 (H) 70 - 99 mg/dL  Glucose, capillary     Status: Abnormal   Collection Time: 04/29/19 11:34 AM  Result Value Ref Range   Glucose-Capillary 203 (H) 70 - 99 mg/dL  Glucose, capillary     Status: Abnormal   Collection Time: 04/29/19  3:04 PM  Result Value Ref Range   Glucose-Capillary 157 (H) 70 - 99 mg/dL  CSF culture with Stat gram stain     Status: None (Preliminary result)   Collection Time: 04/29/19  4:23 PM   Specimen: CSF; Cerebrospinal Fluid  Result Value Ref Range   Specimen Description CSF    Special Requests NONE    Gram Stain      WBC PRESENT,BOTH PMN AND MONONUCLEAR NO ORGANISMS SEEN CYTOSPIN SMEAR    Culture      NO GROWTH < 24 HOURS Performed at [facility], [address] [place], [place] [address]    Report Status PENDING   Glucose, CSF     Status: Abnormal   Collection Time: 04/29/19  4:23 PM  Result Value Ref Range   Glucose, CSF 99 (H) 40 - 70 mg/dL    Comment: Performed at [facility] [address] [place], [place] [address]  Protein, CSF     Status: None   Collection Time: 04/29/19  4:23 PM  Result Value Ref Range   Total  Protein, CSF 21 15 - 45 mg/dL    Comment: Performed at [facility] [address] [place], [place] [address]  CSF cell count with differential     Status: Abnormal   Collection Time: 04/29/19  4:23 PM  Result Value Ref Range   Tube # 1    Color, CSF COLORLESS COLORLESS   Appearance, CSF CLEAR CLEAR   Supernatant NOT INDICATED    RBC Count, CSF 183 (H) 0 /cu mm   WBC, CSF 2 0 - 5 /cu mm   Other Cells, CSF TOO FEW TO COUNT, SMEAR AVAILABLE FOR REVIEW     Comment: Few lymphs, rare segs & monos. Performed at [facility], [place] [address] [place], [place] [address]   Lymphocyte subsets,flow cytometry (InPt)     Status: None   Collection Time: 04/29/19   4:23 PM  Result Value Ref Range   Lymphocyte subsets SEE SEPARATE REPORT     Comment: CYTOLOGY REPORT Performed at [facility] [address] [place], [place] [address]   Renal function panel (daily at 1600)     Status: Abnormal   Collection Time: 04/29/19  4:37 PM  Result Value Ref Range   Sodium 134 (L) 135 - 145 mmol/L   Potassium 4.5 3.5 - 5.1 mmol/L   Chloride 96 (L) 98 - 111 mmol/L   CO2 26 22 - 32 mmol/L   Glucose, Bld 137 (H) 70 - 99 mg/dL   BUN 46 (H) 6 - 20 mg/dL   Creatinine, Ser 5.60 (H) 0.44 - 1.00 mg/dL   Calcium 8.6 (L) 8.9 - 10.3 mg/dL   Phosphorus 2.4 (L) 2.5 - 4.6 mg/dL   Albumin 1.3 (L) 3.5 - 5.0 g/dL   GFR calc non Af Amer 9 (L) >60 mL/min   GFR calc Af Amer 10 (L) >60 mL/min   Anion gap 12 5 - 15    Comment: Performed at [facility], [address], [place], [place] [address]  Glucose, capillary     Status: Abnormal   Collection Time: 04/29/19  7:40 PM  Result Value Ref Range   Glucose-Capillary 129 (H) 70 - 99 mg/dL  Glucose, capillary     Status: Abnormal   Collection Time: 04/29/19 11:25 PM  Result Value Ref Range   Glucose-Capillary 147 (H) 70 - 99 mg/dL  Renal function panel (daily at 0500)     Status: Abnormal   Collection Time: 04/30/19  2:23 AM  Result Value Ref Range   Sodium 137 135 - 145 mmol/L   Potassium 5.1 3.5 - 5.1 mmol/L   Chloride 92 (L) 98 - 111 mmol/L   CO2 23 22 - 32 mmol/L   Glucose, Bld 172 (H) 70 - 99 mg/dL   BUN 52 (H) 6 - 20 mg/dL   Creatinine, Ser 6.08 (H) 0.44 - 1.00 mg/dL   Calcium 9.0 8.9 - 10.3 mg/dL   Phosphorus 6.0 (H) 2.5 - 4.6 mg/dL  Albumin 1.2 (L) 3.5 - 5.0 g/dL   GFR calc non Af Amer 8 (L) >60 mL/min   GFR calc Af Amer 9 (L) >60 mL/min   Anion gap 22 (H) 5 - 15    Comment: Performed at [facility] [address] [place], [place] [address]  Magnesium     Status: Abnormal   Collection Time: 04/30/19  2:23 AM  Result Value Ref Range   Magnesium 2.9 (H) 1.7 - 2.4 mg/dL     Comment: Performed at [facility] [address], [place], [place] [address]  I-STAT 7, (LYTES, BLD GAS, ICA, H+H)     Status: Abnormal   Collection Time: 04/30/19  2:25 AM  Result Value Ref Range   pH, Arterial 7.144 (LL) 7.350 - 7.450   pCO2 arterial 71.0 (HH) 32.0 - 48.0 mmHg   pO2, Arterial 97.0 83.0 - 108.0 mmHg   Bicarbonate 24.6 20.0 - 28.0 mmol/L   TCO2 27 22 - 32 mmol/L   O2 Saturation 95.0 %   Acid-base deficit 5.0 (H) 0.0 - 2.0 mmol/L   Sodium 134 (L) 135 - 145 mmol/L   Potassium 4.9 3.5 - 5.1 mmol/L   Calcium, Ion 1.20 1.15 - 1.40 mmol/L   HCT 27.0 (L) 36.0 - 46.0 %   Hemoglobin 9.2 (L) 12.0 - 15.0 g/dL   Patient temperature 97.8 F    Collection site RADIAL, ALLEN'S TEST ACCEPTABLE    Drawn by RT    Sample type ARTERIAL    Comment NOTIFIED PHYSICIAN   Vancomycin, random     Status: None   Collection Time: 04/30/19  2:31 AM  Result Value Ref Range   Vancomycin Rm 19     Comment:        Random Vancomycin therapeutic range is dependent on dosage and time of specimen collection. A peak range is 20.0-40.0 ug/mL A trough range is 5.0-15.0 ug/mL        Performed at [facility] [address] [place], [place] [address]   Lactic acid, plasma     Status: Abnormal   Collection Time: 04/30/19  2:47 AM  Result Value Ref Range   Lactic Acid, Venous 9.5 (HH) 0.5 - 1.9 mmol/L    Comment: CRITICAL RESULT CALLED TO, READ BACK BY AND VERIFIED WITH: DIAZ A,RN 04/30/19 0326 WAYK Performed at [facility], [place] [address], [place], [place] [address]   Hepatic function panel     Status: Abnormal   Collection Time: 04/30/19  2:49 AM  Result Value Ref Range   Total Protein 6.2 (L) 6.5 - 8.1 g/dL   Albumin 1.1 (L) 3.5 - 5.0 g/dL   AST 242 (H) 15 - 41 U/L   ALT 68 (H) 0 - 44 U/L   Alkaline Phosphatase 720 (H) 38 - 126 U/L   Total Bilirubin 1.8 (H) 0.3 - 1.2 mg/dL   Bilirubin, Direct 1.2 (H) 0.0 - 0.2 mg/dL   Indirect Bilirubin 0.6 0.3 - 0.9 mg/dL     Comment: Performed at [facility] [address] [place],  [address]  CBC     Status: Abnormal   Collection Time: 04/30/19  2:57 AM  Result Value Ref Range   WBC 14.3 (H) 4.0 - 10.5 K/uL   RBC 2.80 (L) 3.87 - 5.11 MIL/uL   Hemoglobin 8.4 (L) 12.0 - 15.0 g/dL   HCT 28.4 (L) 36.0 - 46.0 %   MCV 101.4 (H) 80.0 -  100.0 fL   MCH 30.0 26.0 - 34.0 pg   MCHC 29.6 (L) 30.0 - 36.0 g/dL   RDW 17.1 (H) 11.5 - 15.5 %   Platelets 133 (L) 150 - 400 K/uL   nRBC 0.6 (H) 0.0 - 0.2 %    Comment: Performed at [facility] [address] [place], [place] [address]  Protime-INR     Status: Abnormal   Collection Time: 04/30/19  2:57 AM  Result Value Ref Range   Prothrombin Time 16.1 (H) 11.4 - 15.2 seconds   INR 1.3 (H) 0.8 - 1.2    Comment: (NOTE) INR goal varies based on device and disease states. Performed at [facility], [place] [address] [place], [place] [address]   Ammonia     Status: Abnormal   Collection Time: 04/30/19  2:58 AM  Result Value Ref Range   Ammonia 113 (H) 9 - 35 umol/L    Comment: Performed at [facility], [place] [address] [place], [place] [address]  I-STAT 7, (LYTES, BLD GAS, ICA, H+H)     Status: Abnormal   Collection Time: 04/30/19  3:41 AM  Result Value Ref Range   pH, Arterial 7.255 (L) 7.350 - 7.450   pCO2 arterial 69.0 (HH) 32.0 - 48.0 mmHg   pO2, Arterial 345.0 (H) 83.0 - 108.0 mmHg   Bicarbonate 30.8 (H) 20.0 - 28.0 mmol/L   TCO2 33 (H) 22 - 32 mmol/L   O2 Saturation 100.0 %   Acid-Base Excess 2.0 0.0 - 2.0 mmol/L   Sodium 135 135 - 145 mmol/L   Potassium 4.5 3.5 - 5.1 mmol/L   Calcium, Ion 1.19 1.15 - 1.40 mmol/L   HCT 33.0 (L) 36.0 - 46.0 %   Hemoglobin 11.2 (L) 12.0 - 15.0 g/dL   Patient temperature 97.8 F    Collection site RADIAL, ALLEN'S TEST ACCEPTABLE    Drawn by RT    Sample type ARTERIAL    Comment NOTIFIED PHYSICIAN   I-STAT 7, (LYTES, BLD GAS, ICA, H+H)     Status: Abnormal   Collection Time: 04/30/19  4:32 AM   Result Value Ref Range   pH, Arterial 7.249 (L) 7.350 - 7.450   pCO2 arterial 59.5 (H) 32.0 - 48.0 mmHg   pO2, Arterial 99.0 83.0 - 108.0 mmHg   Bicarbonate 26.2 20.0 - 28.0 mmol/L   TCO2 28 22 - 32 mmol/L   O2 Saturation 96.0 %   Acid-base deficit 2.0 0.0 - 2.0 mmol/L   Sodium 134 (L) 135 - 145 mmol/L   Potassium 5.3 (H) 3.5 - 5.1 mmol/L   Calcium, Ion 1.30 1.15 - 1.40 mmol/L   HCT 25.0 (L) 36.0 - 46.0 %   Hemoglobin 8.5 (L) 12.0 - 15.0 g/dL   Patient temperature 97.8 F    Collection site RADIAL, ALLEN'S TEST ACCEPTABLE    Drawn by RT    Sample type ARTERIAL   I-STAT 7, (LYTES, BLD GAS, ICA, H+H)     Status: Abnormal   Collection Time: 04/30/19  5:37 AM  Result Value Ref Range   pH, Arterial 7.338 (L) 7.350 - 7.450   pCO2 arterial 52.6 (H) 32.0 - 48.0 mmHg   pO2, Arterial 98.0 83.0 - 108.0 mmHg   Bicarbonate 28.5 (H) 20.0 - 28.0 mmol/L   TCO2 30 22 - 32 mmol/L   O2 Saturation 97.0 %   Acid-Base Excess 2.0 0.0 - 2.0 mmol/L   Sodium 135 135 - 145 mmol/L  Potassium 4.7 3.5 - 5.1 mmol/L   Calcium, Ion 1.26 1.15 - 1.40 mmol/L   HCT 26.0 (L) 36.0 - 46.0 %   Hemoglobin 8.8 (L) 12.0 - 15.0 g/dL   Patient temperature 97.5 F    Collection site RADIAL, ALLEN'S TEST ACCEPTABLE    Drawn by RT    Sample type ARTERIAL   Glucose, capillary     Status: None   Collection Time: 04/30/19  7:43 AM  Result Value Ref Range   Glucose-Capillary 95 70 - 99 mg/dL  Lactic acid, plasma     Status: None   Collection Time: 04/30/19  8:35 AM  Result Value Ref Range   Lactic Acid, Venous 1.6 0.5 - 1.9 mmol/L    Comment: Performed at [facility], [place] [address] [place], [place] [address]   Mr Brain 27 Contrast  Result Date: 04/28/2019 CLINICAL DATA:  Bacteremia, end stage renal disease, abnormal MRI with interval cardiac arrest EXAM: MRI HEAD WITHOUT CONTRAST TECHNIQUE: Multiplanar, multiecho pulse sequences of the brain and surrounding structures were obtained without intravenous contrast.  COMPARISON:  April 23, 2019 FINDINGS: Brain: There is persistent and expanded reduced diffusion along the corpus callosum primarily involving the body with some extension into the genu on the left. New small foci of reduced diffusion are identified adjacent to the left frontal horn near the foramen of Monro, right frontal subcortical white matter, and adjacent to the atrium of the right lateral ventricle. There is no significant mass effect. No evidence of intracranial hemorrhage. Vascular: Major vessel flow voids at the skull base are preserved. Skull and upper cervical spine: Decreased T1 marrow signal, which may reflect hematopoetic marrow. Sinuses/Orbits: Diffuse paranasal sinus mucosal thickening. Orbits are unremarkable. Other: Right mastoid effusion. IMPRESSION: Extension of abnormal diffusion signal involving the corpus callosum with unchanged differential including primary lesion and an immune mediated secondary lesion. Minimal new additional punctate foci of abnormal diffusion, which may reflect the same process or unrelated new infarcts. Electronically Signed   By: Macy Mis M.D.   On: 04/28/2019 13:07   Dg Chest Port 1 View  Result Date: 04/30/2019 CLINICAL DATA:  Central line placement. EXAM: PORTABLE CHEST 1 VIEW COMPARISON:  04/30/2019 earlier. FINDINGS: Endotracheal tube without significant change with tip 1.8 cm above the carina. 2 enteric tubes course into the region of the stomach and off the film as tips are not visualized. Interval placement of left IJ central venous catheter with tip just below the right atrium. This could be pulled back 3-4 cm. Lungs are somewhat hypoinflated demonstrate bilateral patchy hazy airspace opacification without significant change likely multifocal infection. No evidence of effusion. No pneumothorax. Mild stable cardiomegaly. Remainder of the exam is unchanged. IMPRESSION: Stable hazy bilateral patchy airspace process likely multifocal infection. Multiple  tubes and lines as described. New left IJ central venous catheter with tip over the right atrium. This could be pulled back 3-4 cm. These results will be called to the ordering clinician or representative by the Radiologist Assistant, and communication documented in the PACS or zVision Dashboard. Electronically Signed   By: Marin Olp M.D.   On: 04/30/2019 07:27   Dg Chest Port 1 View  Result Date: 04/30/2019 CLINICAL DATA:  Post CPR EXAM: PORTABLE CHEST 1 VIEW COMPARISON:  04/29/2019 FINDINGS: Support devices are stable. Cardiomegaly. Left lung clear. Patchy airspace disease throughout the right lung, stable. Small right pleural effusion. No pneumothorax. No acute bony abnormality. IMPRESSION: Patchy right lung airspace disease, stable. Small right  pleural effusion. Electronically Signed   By: Rolm Baptise M.D.   On: 04/30/2019 03:31   Dg Chest Port 1 View  Result Date: 04/29/2019 CLINICAL DATA:  Respiratory failure EXAM: PORTABLE CHEST 1 VIEW COMPARISON:  04/23/2019 FINDINGS: The endotracheal tube terminates above the carina and at the thoracic inlet. The enteric tubes extend below the left hemidiaphragm. The heart size remains enlarged. There are streaky, hazy bilateral airspace opacities, most notably in the right upper lobe. There is no pneumothorax. A small right-sided pleural effusion is suspected. IMPRESSION: 1. Lines and tubes as above. 2. Persistent interstitial persistent hazy bilateral airspace opacities, greatest within the right upper lobe. Electronically Signed   By: Constance Holster M.D.   On: 04/29/2019 06:09   Dg Fl Guided Lumbar Puncture  Result Date: 04/29/2019 CLINICAL DATA:  38 year old female in the medical ICU with bacteremia, end-stage renal disease, abnormal brain MRI with progressive corpus callosum lesion since 04/28/2019. Diagnostic lumbar puncture requested. EXAM: DIAGNOSTIC LUMBAR PUNCTURE UNDER FLUOROSCOPIC GUIDANCE FLUOROSCOPY TIME:  Fluoroscopy Time:  0 minutes 12  seconds Radiation Exposure Index (if provided by the fluoroscopic device): Number of Acquired Spot Images: 0 PROCEDURE: Informed consent was obtained from the patient's sister by telephone prior to the procedure, including potential complications of headache, allergy, and pain. A "time-out" was performed. With the patient prone, the lower back was prepped with Betadine. 1% Lidocaine was used for local anesthesia. Lumbar puncture was performed at the L2-L3 level using left sub laminar technique and a 5 inch x 20 gauge needle with return of slightly cloudy CSF with an elevated opening pressure of 41.5 cm water. 22 mL of CSF were obtained for laboratory studies. The needle was withdrawn, direct pressure held and hemostasis noted. The patient tolerated the procedure well and there were no apparent complications. The patient was returned to the inpatient floor in stable condition for continued treatment. IMPRESSION: 1. Fluoroscopic guided lumbar puncture at the L2-L3 level with 22 mL of slightly cloudy CSF obtained for analysis. 2. Elevated opening pressure of 41.5 cm of water, although perhaps this measurement might be affected by the patient's prone positioning as well as mechanical ventilation. Electronically Signed   By: Genevie Ann M.D.   On: 04/29/2019 16:45      Assessment/Plan MMP  Neck abscess after removal of central line The patient appears to have a spontaneously draining collection.  Given her overall poor condition and possible neurologic decline after multiple codes, we would not recommend any further surgical intervention for this site.  Defer abx therapy to the primary service along with goals of care, etc.  We will sign off.   Henreitta Cea, [facility] Surgery 04/30/2019, 10:30 AM Please see Amion for pager number during day hours 7:00am-4:30pm

## 2019-04-30 NOTE — Progress Notes (Signed)
CRITICAL VALUE ALERT  Critical Value:  Lactic 9.5  Date & Time Notied:  04/30/19 0325  Provider Notified: Gilford Raid, MD

## 2019-04-30 NOTE — Progress Notes (Signed)
[facility] Progress Note Patient Name: Jannette Zaccheo DOB: 06-Oct-1980 MRN: [id number]   Date of Service  04/30/2019  HPI/Events of Note  Patient maxed out on norepinephrine. I liter removed during HD  eICU Interventions   Added vasopressin  Ordered albumin     Intervention Category Major Interventions: Hypotension - evaluation and management  Judd Lien 04/30/2019, 10:52 PM

## 2019-04-30 NOTE — Progress Notes (Signed)
Spoke with Carla Compton's family in the waiting room. Family planning to get a hotel tonight and return in the morning. Prayed for patient at bedside.  Will continue to provide spiritual care.  Rev. [place].

## 2019-04-30 NOTE — Progress Notes (Signed)
PT Cancellation Note  Patient Details Name: Carla Compton MRN: [id number] DOB: 11-10-1980   Cancelled Treatment:    Reason Eval/Treat Not Completed: (P) Patient not medically ready Pt 2x PEA this am and has had a marked decline in responsiveness. PT signing off. Please reorder if as needed.   Karsynn Deweese B. Migdalia Dk PT, DPT Acute Rehabilitation Services Pager [phone] Office [phone]  [place] 04/30/2019, 9:57 AM

## 2019-04-30 NOTE — Code Documentation (Signed)
  Patient Name: Carla Compton   MRN: [id number]   Date of Birth/ Sex: 1981/03/23 , female      Admission Date: 03/26/2019  Attending Provider: Rush Farmer, MD  Primary Diagnosis: MRSA bacteremia   Indication: Pt was in her usual state of health until this AM, when she was noted to have PEA. Code blue was subsequently called. At the time of arrival on scene, ACLS protocol was underway.   Technical Description:  - CPR performance duration:  5 minutes  - Was defibrillation or cardioversion used? no  - Was external pacer placed? no  - Was patient intubated pre/post CPR? Intubated pre CPR   Medications Administered: Y = Yes; Blank = No Amiodarone    Atropine    Calcium    Epinephrine  y  Lidocaine    Magnesium    Norepinephrine    Phenylephrine    Sodium bicarbonate  y  Vasopressin     Post CPR evaluation:  - Final Status - Was patient successfully resuscitated ? Yes - What is current rhythm? Sinus  - What is current hemodynamic status? Stable   Miscellaneous Information:  - Labs sent, including: [facility] lab panel   - Primary team notified?  Yes  - Family Notified? No  - Additional notes/ transfer status:  Pt will remain in ICU     Earlene Plater, MD Internal Medicine, PGY1 Pager: [phone]  04/30/2019,2:28 AM

## 2019-04-30 NOTE — Progress Notes (Addendum)
PCCM Overnight Update    Post Initial Arrest despite improvement in ABG Pt again had an episode of hypotension w/ desaturation despite ventilation. Did not respond to atropine for bradycardia.  O2 sats during AMBU bag ventilations max 88%. Desaturation appears to be perfusion related. No mucous plugging on my exam. SV appears to be very rate dependent.   Concern for worsening sepsis given her Bacteremia and multiple foci of infection  During initial assessment when pt became bradycardic and did not respond to atropine no pulse could be appreciated. ACLS protocol was followed  Upon Pt received Epi push noted to be in [place]. Pt had a pulse at this time ( carotid and I was able to appreciate through precordium) Gave small dose of metoprolol at that time. Pt went into Afib RVR Was on Levophed gtt. Decision made to place central access and arterial catheter for hemodynamic monitoring. Post placement of Left femoral A line pt noted to be in SVT. Confirmed with rhythm strip Given her hypotension performed synchronized cardioversion at 120 J HR went from 160 to 114 and MAPs >70.  Placed LIJ CVC after pt was stable with no events or complications.  Line was confirmed for use post CXR.  Family was constantly kept in the loop about the overnight events by the overnight RN.  Signed Dr Seward Carol Pulmonary Critical Care Locums

## 2019-04-30 NOTE — Progress Notes (Signed)
0205  desat alarm going off for sp02 of 88%.  Went in to assess patient, oral suctioned frothy clear sputum, colleague assessed for pulse.  Pulse difficult to palp, lips white, hr=45 rhythm pea.. code initiated, cpr initiated.  2 epi and 1 bicarb given.  0210... ROSC achieved, IV team accessed  Fistula for emergency  Labs.  MD's on scene, patient currently maintaining own vitals.  Continuing to closely monitor

## 2019-04-30 NOTE — Progress Notes (Addendum)
Pharmacy Antibiotic Note  Carla Compton is a 38 y.o. female admitted on 04/09/2019 with persistent MRSA bacteremia. Repeat BCx negative. PMH of ESRD with HD MWF.  Patient given additional 1g dose of vancomycin prior to scheduled HD today.  Pre-HD vancomycin random level 19. Pre- HD goal 15-25 mcg/mL Post- HD goal 5-15 mcg/mL  Plan: Due to extra dose, will not give any additional vancomycin today. Obtain vancomycin level with AM labs.  Monitor for signs of continued clinical improvement, blood cx, and pre-HD vancomycin level as indicated. ID planning 4 weeks of treatment through 11/23   Height: 5\' 2"  (157.5 cm) Weight: 249 lb 1.9 oz (113 kg) IBW/kg (Calculated) : 50.1  Temp (24hrs), Avg:99.3 F (37.4 C), Min:98.2 F (36.8 C), Max:101.5 F (38.6 C)  Recent Labs  Lab 04/26/19 0540 04/26/19 0752  04/27/19 0314  04/28/19 0448 04/28/19 1436 04/29/19 0310 04/29/19 1637 04/30/19 0223 04/30/19 0231 04/30/19 0247 04/30/19 0257  WBC 10.5 10.2  --  7.8  --  8.3  --  7.9  --   --   --   --  14.3*  CREATININE 5.74* 5.87*   < > 3.64*   < > 4.65* 5.04* 5.07* 5.60* 6.08*  --   --   --   LATICACIDVEN  --   --   --   --   --   --   --   --   --   --   --  9.5*  --   VANCORANDOM 30  --   --   --   --   --   --   --   --   --  19  --   --    < > = values in this interval not displayed.    Estimated Creatinine Clearance: 15.2 mL/min (A) (by C-G formula based on SCr of 6.08 mg/dL (H)).    Allergies  Allergen Reactions  . Contrast Media [Iodinated Diagnostic Agents] Other (See Comments)    Affected her kidneys  . Pepto-Bismol [Bismuth] Other (See Comments)    Unknown reaction   Onnie Boer; PharmD Candidate 04/30/2019 8:00 AM

## 2019-04-30 NOTE — Procedures (Signed)
Arterial Catheter Insertion Procedure Note Carla Compton [id number] 1980/11/05  Procedure: Insertion of Arterial Catheter  Indications: Blood pressure monitoring and Frequent blood sampling  Procedure Details Consent: Unable to obtain consent because of emergent medical necessity. Time Out: Verified patient identification, verified procedure, site/side was marked, verified correct patient position, special equipment/implants available, medications/allergies/relevent history reviewed, required imaging and test results available.  Performed  Maximum sterile technique was used including antiseptics, cap, gloves, gown, hand hygiene, mask and sheet. Skin prep: Chlorhexidine; local anesthetic administered 20 gauge catheter was inserted into left femoral artery using the Seldinger technique. ULTRASOUND GUIDANCE USED: YES Evaluation Blood flow good; BP tracing good. Complications: No apparent complications.    Carla Compton 04/30/2019

## 2019-04-30 NOTE — Progress Notes (Signed)
Brief Infectious Disease Progress Note Update:   Carla Compton is a 38 y.o. 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   Discussed with Dr. Malen Gauze re: CT scan findings  IMPRESSION: 1. 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 2. Intramuscular abscess in the left subscapularis muscle. 3. Multifocal pneumonia with dense consolidation and with patchy ground-glass nodules potentially related to septic emboli. 4. A potential right acromioclavicular involvement. 5. Extension of above process into the right neck. 6. Small right-sided effusion. 7. Bilateral rib fractures as described. 8. Marked engorgement of central pulmonary vasculature suggests pulmonary arterial hypertension.  Her CNS preservation takes precedent here and would proceed with steroids. Given the extensive findings of right thoracic/clavicular osteomyelitis and fluid collections + myositis these would likely need surgery for control, which at this point she would not be a candidate for given neuro changes and multiple arrests.   Given the findings that this infection extends into the right neck I suspect that the abscess where her previous central line was became infected and was the foci here.   The above plan was discussed in depth with Dr. Tommy Medal.    Janene Madeira, MSN, NP-C [facility] [email or web address] Pager: [phone] Office: [phone] [place]: [phone]

## 2019-05-01 DIAGNOSIS — Z515 Encounter for palliative care: Secondary | ICD-10-CM

## 2019-05-01 LAB — RENAL FUNCTION PANEL
Albumin: 1.6 g/dL — ABNORMAL LOW (ref 3.5–5.0)
Anion gap: 16 — ABNORMAL HIGH (ref 5–15)
BUN: 28 mg/dL — ABNORMAL HIGH (ref 6–20)
CO2: 25 mmol/L (ref 22–32)
Calcium: 8.5 mg/dL — ABNORMAL LOW (ref 8.9–10.3)
Chloride: 93 mmol/L — ABNORMAL LOW (ref 98–111)
Creatinine, Ser: 3.88 mg/dL — ABNORMAL HIGH (ref 0.44–1.00)
GFR calc Af Amer: 16 mL/min — ABNORMAL LOW (ref >60)
GFR calc non Af Amer: 14 mL/min — ABNORMAL LOW (ref >60)
Glucose, Bld: 97 mg/dL (ref 70–99)
Phosphorus: 3 mg/dL (ref 2.5–4.6)
Potassium: 3.6 mmol/L (ref 3.5–5.1)
Sodium: 134 mmol/L — ABNORMAL LOW (ref 135–145)

## 2019-05-01 LAB — GLUCOSE, CAPILLARY: Glucose-Capillary: 88 mg/dL (ref 70–99)

## 2019-05-01 MED ORDER — MORPHINE 100MG IN NS 100ML (1MG/ML) PREMIX INFUSION: 1.0000 mg/h | INTRAVENOUS | Status: DC

## 2019-05-01 MED ORDER — GLYCOPYRROLATE 1 MG PO TABS: 1.0000 mg | ORAL_TABLET | ORAL | Status: DC | PRN

## 2019-05-01 MED ORDER — ACETAMINOPHEN 325 MG PO TABS: 650.0000 mg | ORAL_TABLET | Freq: Four times a day (QID) | ORAL | Status: DC | PRN

## 2019-05-01 MED ORDER — GLYCOPYRROLATE 0.2 MG/ML IJ SOLN: 0.2000 mg | INTRAMUSCULAR | Status: DC | PRN

## 2019-05-01 MED ORDER — DIPHENHYDRAMINE HCL 50 MG/ML IJ SOLN: 25.0000 mg | INTRAMUSCULAR | Status: DC | PRN

## 2019-05-01 MED ORDER — HYDROCORTISONE NA SUCCINATE PF 100 MG IJ SOLR
100.0000 mg | Freq: Three times a day (TID) | INTRAMUSCULAR | Status: DC
  Administered 2019-05-01: 02:00:00 100 mg via INTRAVENOUS
  Filled 2019-05-01: qty 2

## 2019-05-01 MED ORDER — DEXTROSE 5 % IV SOLN: INTRAVENOUS | Status: DC

## 2019-05-01 MED ORDER — POLYVINYL ALCOHOL 1.4 % OP SOLN
1.0000 [drp] | Freq: Four times a day (QID) | OPHTHALMIC | Status: DC | PRN
  Filled 2019-05-01: qty 15

## 2019-05-01 MED ORDER — ACETAMINOPHEN 650 MG RE SUPP: 650.0000 mg | Freq: Four times a day (QID) | RECTAL | Status: DC | PRN

## 2019-05-03 LAB — CSF CULTURE W GRAM STAIN: Culture: NO GROWTH

## 2019-05-03 LAB — OLIGOCLONAL BANDS, CSF + SERM

## 2019-05-03 LAB — CYTOLOGY - NON PAP

## 2019-05-10 ENCOUNTER — Telehealth: Payer: Self-pay

## 2019-05-10 NOTE — Telephone Encounter (Signed)
Received dc from [facility].  DC is for burial and a patient of Doctor Nelda Marseille.  DC will be taken to [facility] for signature.  On 05/11/2019 Received dc back from Doctor Nelda Marseille. I called the funeral home to let them know Carla Compton is going to come by and pickup the dc in the am.

## 2019-05-25 NOTE — Progress Notes (Signed)
'  having trouble maintaining appropriate bp.  Levo maxed out, vaso running, 25g albumin given.  0230, patient had map of 35 from arterial line.  Called [facility].  Patient placed in trendelenberg and sbp began to respond.  Orders came from [facility] to give solumedrol..  Pt still in trend, holding consistent vital signs

## 2019-05-25 NOTE — Consult Note (Signed)
Followed up to determine if family needed any further chaplain services, but they had gone.   Rev. Eloise Levels Chaplain

## 2019-05-25 NOTE — Significant Event (Addendum)
PCCM CODE CONVERSATION   Pt continued to decline despite increased vasopressor requirements and full critical care support. I spoke with Patient's mother at 5:45AM via telephone Conversation witnessed by PA Gleason  We discussed the current clinical condition The multiple interventions the patient has received  The patient's continued decline and poor neurological exam  The patient's mother was very understanding of the patient's clinical condition She verbally expressed that she did not want her daughter to suffer anymore. We discussed code status and comfort measures  Decision made to make pt DNR Comfort care measures and withdrawal orders placed. Upon the family's arrival this AM we will initiate these measures and commence withdrawal of care. RN aware.   Signed Dr Seward Carol Pulmonary Critical Care Locums

## 2019-05-25 NOTE — Progress Notes (Signed)
Follow up with patient and family. Assisted family at end of life. Will continue to provide spiritual care as needed.  Rev. [place].

## 2019-05-25 NOTE — Death Summary Note (Signed)
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) (HCC)   Normocytic anemia   Idiopathic chronic gout of multiple sites without tophus   ESRD (end stage renal disease) ([place])   Bacteremia   Status post peripherally inserted central catheter (PICC) central line placement   Encephalopathy   Hypoxia   Cardiac arrest ([place])   Septic shock ([place])   Acute on chronic respiratory failure with hypoxia and hypercapnia ([place])   Encounter for central line placement   Comfort measures only status Anoxic encephalopathy  Brief Hospital Course (including significant findings, care, treatment, and services provided and events leading to death)  Carla Compton is a38 y.o.F with PMH of recently diagnosed RLE DVT on Xarelto, ESRD, Type 2 DM, Rheumatoid arthritis, HTN and HL who was initially admitted in [place] 10/19 for septic shock with 4/4 BC grew MRSA and she was transferred to [facility] on Apr 24, 2023 for ID consult and further management. admitted to ICU for septic shock. On 10/30 underwent PEA arrest  With ROSC after 5 minutes of CPR requiring emergent intubation. Has been persistently obtunded since arrest.   Patient continued to deteriorate from a hemodynamics standpoint.  Dr. Gilford Raid from [facility] met with the family and they discussed the interventions that have been going on but continued to decline and that the neurologic concern remains an issue.  Mother  expressed understanding and that she does not wish for the patient to suffer any further and to focus more on comfort and the patient was made DNR and upon arrival of the family will proceed with withdrawal.   Evidently patient suffered an arrest prior to that and expired and family was notified.    Pertinent Labs and Studies  Significant Diagnostic Studies Ct Angio Head W Or Wo Contrast  Result Date: 04/30/2019 CLINICAL DATA:  Vasculitis, CNS. Additional history provided: S/p code, blunt pupils, poor perfusion, arm appears infiltrated (neurology team notified). EXAM: CT ANGIOGRAPHY HEAD AND NECK TECHNIQUE: Multidetector CT imaging of the head and neck was performed using the standard protocol during bolus administration of intravenous contrast. Multiplanar CT image reconstructions and MIPs were obtained to evaluate the vascular anatomy. Carotid stenosis measurements (when applicable) are obtained utilizing NASCET criteria, using the distal internal carotid diameter as the denominator. CONTRAST:  75 mL OMNIPAQUE IOHEXOL 350 MG/ML SOLN COMPARISON:  Brain MRI 04/28/2019, brain MRI 04/23/2019, head CT 04/17/2019 FINDINGS: CT HEAD FINDINGS Brain: Extensive abnormal hypodensity and swelling of the corpus callosum corresponding with the restricted diffusion and signal abnormality seen on recent prior brain MRI. No demarcated cortical infarct. No intracranial hemorrhage. No midline shift or extra-axial fluid collection. Vascular: Reported separately Skull: Normal. Negative for fracture or focal lesion. Sinuses: Pansinusitis with moderate/severe mucosal thickening. Secretions within the nasal passage and nasopharynx. Orbits: Visualized orbits demonstrate no acute abnormality. Review of the MIP images confirms the above findings CTA NECK FINDINGS Please note the patient's IV infiltrated during the examination and there is an extremely poor contrast bolus. This results in  essentially nondiagnostic CT angiography of  the neck. Common origin of the innominate and left common carotid arteries. Some flow is seen within the common and internal carotid arteries bilaterally. The vertebral arteries are barely poorly assessed. Skeleton: Erosive destruction of the medial right clavicle with extensive surrounding abnormal soft tissue. Other neck: There is asymmetric swelling of the mid to caudal right sternocleidomastoid muscle. At the caudal aspect, there is inseparable prominent soft tissue within the lower right neck. Findings likely reflect myositis with phlegmonous changes. Limited assessment for associated abscess although there is some hypodensity centrally. Upper chest: Please refer to concurrent chest CT for a description of intrathoracic findings, including but not limited to, extensive bilateral airspace disease. An ET tube terminates above the level of the carina. Partially visualized enteric tube. Left IJ approach central venous catheter. Review of the MIP images confirms the above findings CTA HEAD FINDINGS Please note the patient's IV infiltrated during the examination and there is an extremely poor contrast bolus. This results in essentially nondiagnostic CT angiography of the head. Flow is seen within the proximal anterior and posterior circulation. Inadequate assessment for stenoses and focal occlusions. Review of the MIP images confirms the above findings These results were called by telephone at the time of interpretation on 04/30/2019 at 11:32 am to provider [facility] , who verbally acknowledged these results. IMPRESSION: CT head: 1. Extensive abnormal hypodensity and swelling of the corpus callosum corresponding with the signal abnormality and restricted diffusion demonstrated on recent prior brain MRIs. Please refer to differential considerations provided on prior brain MRI 04/23/2019. 2. No acute intracranial hemorrhage or demarcated cortical infarct. 3. Pansinusitis. CTA neck: 1. Essentially nondiagnostic CTA of  the neck due to IV infiltration. Some flow is seen within the bilateral common and internal carotid arteries bilaterally. The vertebral arteries are very poorly assessed. 2. Erosive destruction of the medial right clavicle highly suspicious for osteomyelitis with sternoclavicular septic arthritis. Extensive surrounding abnormal soft tissue likely reflecting phlegmonous change. 3. Findings consistent with right sternocleidomastoid myositis with contiguous extensive phlegmonous change in the right lower neck. Evaluation for associated abscess at this site is limited due to poor contrast bolus, although there are some regions of hypodensity centrally. 4. Please refer to separately reported chest CT for intrathoracic findings, including extensive bilateral airspace disease. CTA head: Essentially nondiagnostic CTA of the head due to IV infiltration. Flow is seen within the proximal anterior and posterior circulation. Inadequate assessment for stenoses and focal occlusions. Electronically Signed   By: Kellie Simmering DO   On: 04/30/2019 11:33   Dg Chest 1 View  Result Date: 04/23/2019 CLINICAL DATA:  Hypotension, CPR EXAM: CHEST  1 VIEW COMPARISON:  04/20/2019 FINDINGS: Interval placement of endotracheal tube with distal tip terminating at the level of the carina. Enteric tube courses below the diaphragm with distal tip beyond the inferior margin of the film. Stable cardiomegaly. Ill-defined interstitial opacities bilaterally, improved compared to prior. No pleural effusion or pneumothorax. No acute osseous findings. IMPRESSION: 1. Endotracheal tube terminating at the level of the carina. Recommend retraction approximately 3 cm. 2. Slight interval improvement of bilateral interstitial opacities. These results will be called to the ordering clinician or representative by the Radiologist Assistant, and communication documented in the PACS or zVision Dashboard. Electronically Signed   By: Davina Poke M.D.   On:  04/23/2019 14:03   Dg Abd 1 View  Result Date: 04/23/2019 CLINICAL DATA:  Orogastric tube insertion EXAM: ABDOMEN - 1 VIEW COMPARISON:  06/11/2018 FINDINGS:  A limited view of the upper abdomen was obtained for the purposes of enteric tube localization. An enteric tube is identified coursing below the diaphragm with distal tip and side port terminating in the expected location of the gastric body. Numerous overlying leads and devices are present. No gross free intraperitoneal air on limited view. IMPRESSION: Orogastric tube appropriately positioned within the expected location of the gastric body. Electronically Signed   By: Davina Poke M.D.   On: 04/23/2019 14:00   Ct Head Wo Contrast  Result Date: 04/17/2019 CLINICAL DATA:  Altered level of consciousness. EXAM: CT HEAD WITHOUT CONTRAST TECHNIQUE: Contiguous axial images were obtained from the base of the skull through the vertex without intravenous contrast. COMPARISON:  None. FINDINGS: Brain: No evidence of acute infarction, hemorrhage, hydrocephalus, extra-axial collection or mass lesion/mass effect. Vascular: None Skull: Normal. Negative for fracture or focal lesion. Sinuses/Orbits: There is diffuse mucosal thickening involving the sphenoid, maxillary and frontal sinuses as well as the ethmoid air cells. No air-fluid levels. Other: None IMPRESSION: 1. No acute intracranial abnormalities. 2. Chronic pan sinus inflammation. Electronically Signed   By: Kerby Moors M.D.   On: 04/17/2019 06:06   Ct Angio Neck W Or Wo Contrast  Result Date: 04/30/2019 CLINICAL DATA:  Vasculitis, CNS. Additional history provided: S/p code, blunt pupils, poor perfusion, arm appears infiltrated (neurology team notified). EXAM: CT ANGIOGRAPHY HEAD AND NECK TECHNIQUE: Multidetector CT imaging of the head and neck was performed using the standard protocol during bolus administration of intravenous contrast. Multiplanar CT image reconstructions and MIPs were obtained to  evaluate the vascular anatomy. Carotid stenosis measurements (when applicable) are obtained utilizing NASCET criteria, using the distal internal carotid diameter as the denominator. CONTRAST:  75 mL OMNIPAQUE IOHEXOL 350 MG/ML SOLN COMPARISON:  Brain MRI 04/28/2019, brain MRI 04/23/2019, head CT 04/17/2019 FINDINGS: CT HEAD FINDINGS Brain: Extensive abnormal hypodensity and swelling of the corpus callosum corresponding with the restricted diffusion and signal abnormality seen on recent prior brain MRI. No demarcated cortical infarct. No intracranial hemorrhage. No midline shift or extra-axial fluid collection. Vascular: Reported separately Skull: Normal. Negative for fracture or focal lesion. Sinuses: Pansinusitis with moderate/severe mucosal thickening. Secretions within the nasal passage and nasopharynx. Orbits: Visualized orbits demonstrate no acute abnormality. Review of the MIP images confirms the above findings CTA NECK FINDINGS Please note the patient's IV infiltrated during the examination and there is an extremely poor contrast bolus. This results in essentially nondiagnostic CT angiography of the neck. Common origin of the innominate and left common carotid arteries. Some flow is seen within the common and internal carotid arteries bilaterally. The vertebral arteries are barely poorly assessed. Skeleton: Erosive destruction of the medial right clavicle with extensive surrounding abnormal soft tissue. Other neck: There is asymmetric swelling of the mid to caudal right sternocleidomastoid muscle. At the caudal aspect, there is inseparable prominent soft tissue within the lower right neck. Findings likely reflect myositis with phlegmonous changes. Limited assessment for associated abscess although there is some hypodensity centrally. Upper chest: Please refer to concurrent chest CT for a description of intrathoracic findings, including but not limited to, extensive bilateral airspace disease. An ET tube  terminates above the level of the carina. Partially visualized enteric tube. Left IJ approach central venous catheter. Review of the MIP images confirms the above findings CTA HEAD FINDINGS Please note the patient's IV infiltrated during the examination and there is an extremely poor contrast bolus. This results in essentially nondiagnostic CT angiography of the head. Flow is  seen within the proximal anterior and posterior circulation. Inadequate assessment for stenoses and focal occlusions. Review of the MIP images confirms the above findings These results were called by telephone at the time of interpretation on 04/30/2019 at 11:32 am to provider [facility] , who verbally acknowledged these results. IMPRESSION: CT head: 1. Extensive abnormal hypodensity and swelling of the corpus callosum corresponding with the signal abnormality and restricted diffusion demonstrated on recent prior brain MRIs. Please refer to differential considerations provided on prior brain MRI 04/23/2019. 2. No acute intracranial hemorrhage or demarcated cortical infarct. 3. Pansinusitis. CTA neck: 1. Essentially nondiagnostic CTA of the neck due to IV infiltration. Some flow is seen within the bilateral common and internal carotid arteries bilaterally. The vertebral arteries are very poorly assessed. 2. Erosive destruction of the medial right clavicle highly suspicious for osteomyelitis with sternoclavicular septic arthritis. Extensive surrounding abnormal soft tissue likely reflecting phlegmonous change. 3. Findings consistent with right sternocleidomastoid myositis with contiguous extensive phlegmonous change in the right lower neck. Evaluation for associated abscess at this site is limited due to poor contrast bolus, although there are some regions of hypodensity centrally. 4. Please refer to separately reported chest CT for intrathoracic findings, including extensive bilateral airspace disease. CTA head: Essentially nondiagnostic CTA of  the head due to IV infiltration. Flow is seen within the proximal anterior and posterior circulation. Inadequate assessment for stenoses and focal occlusions. Electronically Signed   By: Kellie Simmering DO   On: 04/30/2019 11:33   Ct Chest W Contrast  Result Date: 04/30/2019 CLINICAL DATA:  Staff o'clock a soft tissue infection of right chest wall. EXAM: CT CHEST WITH CONTRAST TECHNIQUE: Multidetector CT imaging of the chest was performed during intravenous contrast administration. CONTRAST:  144m OMNIPAQUE IOHEXOL 350 MG/ML SOLN COMPARISON:  Multiple prior chest x-rays, no recent CT chest evaluations for comparison. FINDINGS: Cardiovascular: Left IJ catheter terminates in the caval to atrial junction. Heart size is enlarged. No significant pericardial effusion. Marked engorgement of central pulmonary vasculature with 3.8 cm central pulmonary artery dimension. Mediastinum/Nodes: Endotracheal tube approximately 1.5 cm from the carina. Scattered lymph nodes throughout the chest. Abscess along the right clavicle and chest wall extends into the right sternoclavicular joint and there is soft tissue thickening in the anterior mediastinum tracking along the undersurface of the sternum particularly on the right. Lungs/Pleura: Dense consolidation noted bilaterally with worse consolidation on the right as compared to the left. Patchy nodular opacities in the more aerated portions of the lung in the upper lobes. Small right-sided effusion. Upper Abdomen: Enteric tubes in place. Liver is unremarkable with respect imaged portions as are other abdominal viscera. Musculoskeletal: Large multiloculated collection surrounds the right clavicle extending from the sternoclavicular joint, along the sternum and anterior mediastinum to the distal clavicle. There is destruction of the medial right clavicular head and the right sternoclavicular joint with marked asymmetry and signs of osteomyelitis involving sternum, clavicle and right  first rib. Fluid collection measures approximately 10 x 4.4 cm in greatest axial dimension. Extension also into rights sternocleidomastoid, see CT angio of the head and neck. Left subscapularis fluid collection 4.8 x 1.7 cm (image 13 of the oblique reformat data set. Signs of myositis and abscess anterior to the sternoclavicular joint involving the right pectoralis muscle also with myositis involving the medial aspect of the left pectoralis muscle. Sclerosis of left sternoclavicular joint as well. Erosion of distal right clavicle raising the question of acromioclavicular involvement as well. Angulated nondisplaced fractures of left fourth, fifth,  sixth and seventh ribs with additional fracture of the left eighth rib along the costochondral junction. Right third, fourth, fifth, sixth and seventh rib fractures with mild angulation also of the right anterior eighth rib. IMPRESSION: 1. 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 2. Intramuscular abscess in the left subscapularis muscle. 3. Multifocal pneumonia with dense consolidation and with patchy ground-glass nodules potentially related to septic emboli. 4. A potential right acromioclavicular involvement. 5. Extension of above process into the right neck. 6. Small right-sided effusion. 7. Bilateral rib fractures as described. 8. Marked engorgement of central pulmonary vasculature suggests pulmonary arterial hypertension. Aortic Atherosclerosis (ICD10-I70.0). Electronically Signed   By: Zetta Bills M.D.   On: 04/30/2019 12:33   Mr Brain Wo Contrast  Result Date: 04/28/2019 CLINICAL DATA:  Bacteremia, end stage renal disease, abnormal MRI with interval cardiac arrest EXAM: MRI HEAD WITHOUT CONTRAST TECHNIQUE: Multiplanar, multiecho pulse sequences of the brain and surrounding structures were obtained without intravenous contrast. COMPARISON:  April 23, 2019 FINDINGS:  Brain: There is persistent and expanded reduced diffusion along the corpus callosum primarily involving the body with some extension into the genu on the left. New small foci of reduced diffusion are identified adjacent to the left frontal horn near the foramen of Monro, right frontal subcortical white matter, and adjacent to the atrium of the right lateral ventricle. There is no significant mass effect. No evidence of intracranial hemorrhage. Vascular: Major vessel flow voids at the skull base are preserved. Skull and upper cervical spine: Decreased T1 marrow signal, which may reflect hematopoetic marrow. Sinuses/Orbits: Diffuse paranasal sinus mucosal thickening. Orbits are unremarkable. Other: Right mastoid effusion. IMPRESSION: Extension of abnormal diffusion signal involving the corpus callosum with unchanged differential including primary lesion and an immune mediated secondary lesion. Minimal new additional punctate foci of abnormal diffusion, which may reflect the same process or unrelated new infarcts. Electronically Signed   By: Macy Mis M.D.   On: 04/28/2019 13:07   Mr Brain Wo Contrast  Result Date: 04/23/2019 CLINICAL DATA:  Altered mental status. End-stage renal disease. Bacteremia. EXAM: MRI HEAD WITHOUT CONTRAST TECHNIQUE: Multiplanar, multiecho pulse sequences of the brain and surrounding structures were obtained without intravenous contrast. COMPARISON:  Head CT 04/17/2019 FINDINGS: Brain: No abnormality of the brainstem or cerebellum. There are foci of abnormal restricted diffusion and T2 signal within the corpus callosum, affecting the genu on the left and much of the body. The differential diagnosis includes post viral demyelination, toxic demyelination, multiple sclerosis and neuromyelitis optica. Autoimmune encephalopathy/vasculopathy ([place] syndrome) is possible. Occasionally, posterior reversible encephalopathy can involve only the corpus callosum. Cytotoxic lesions of the corpus  callosum can occur subsequent to various immune mediated responses. Elsewhere, the cerebral hemispheres appear normal. No evidence of hemorrhage, hydrocephalus or extra-axial collection. Vascular: Major vessels at the base of the brain show flow. Skull and upper cervical spine: Negative Sinuses/Orbits: Mucosal inflammatory changes of the paranasal sinuses as seen previously. Orbits negative. 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 Broad differential diagnosis as above. Electronically Signed   By: Nelson Chimes M.D.   On: 04/23/2019 12:47   Mr Hip Right Wo Contrast  Result Date: 04/23/2019 CLINICAL DATA:  MRSA bacteremia of unknown source. EXAM: MR OF THE RIGHT HIP WITHOUT CONTRAST TECHNIQUE: Multiplanar, multisequence MR imaging was performed. No intravenous contrast was administered. COMPARISON:  CT scan dated 04/20/2019 FINDINGS:  Bones: There is minimal edema in the superior posterolateral aspect of the right femoral head, likely degenerative. No other significant bone abnormality of the hips or pelvis. Chronic low signal intensity from the bones is felt to be related to the patient's chronic renal disease. Articular cartilage and labrum Articular cartilage: Slight diffuse thinning of the articular cartilage, with more thinning posteriorly than elsewhere in the joint. Labrum:  Intact. Joint or bursal effusion Joint effusion:  Small joint effusion, similar to the opposite hip. Bursae: There is prominent right iliopsoas bursitis with fluid extending along the iliopsoas tendon anterior to the right hip. No greater trochanteric bursitis. Muscles and tendons Muscles and tendons: There is slight patchy edema in most of the muscles around the right hip. There is slight edema in the left gluteal muscles. No abscesses. There is edema in the subcutaneous fat adjacent to the catheter in the left femoral vein. This could be due to  hemorrhage from the catheter insertion or cellulitis. Other findings Miscellaneous:   Bilateral slight inguinal adenopathy, nonspecific. IMPRESSION: 1. Prominent right iliopsoas bursitis. 2. Edema in the subcutaneous fat adjacent to the catheter in the left femoral vein. This could be due to hemorrhage from the catheter insertion or cellulitis. 3. Slight arthritic changes of the right hip. 4. Slight nonspecific edema in the muscles of the right hip and buttock. Electronically Signed   By: Lorriane Shire M.D.   On: 04/23/2019 13:20   Dg Chest Port 1 View  Result Date: 04/30/2019 CLINICAL DATA:  Central line placement. EXAM: PORTABLE CHEST 1 VIEW COMPARISON:  04/30/2019 earlier. FINDINGS: Endotracheal tube without significant change with tip 1.8 cm above the carina. 2 enteric tubes course into the region of the stomach and off the film as tips are not visualized. Interval placement of left IJ central venous catheter with tip just below the right atrium. This could be pulled back 3-4 cm. Lungs are somewhat hypoinflated demonstrate bilateral patchy hazy airspace opacification without significant change likely multifocal infection. No evidence of effusion. No pneumothorax. Mild stable cardiomegaly. Remainder of the exam is unchanged. IMPRESSION: Stable hazy bilateral patchy airspace process likely multifocal infection. Multiple tubes and lines as described. New left IJ central venous catheter with tip over the right atrium. This could be pulled back 3-4 cm. These results will be called to the ordering clinician or representative by the Radiologist Assistant, and communication documented in the PACS or zVision Dashboard. Electronically Signed   By: Marin Olp M.D.   On: 04/30/2019 07:27   Dg Chest Port 1 View  Result Date: 04/30/2019 CLINICAL DATA:  Post CPR EXAM: PORTABLE CHEST 1 VIEW COMPARISON:  04/29/2019 FINDINGS: Support devices are stable. Cardiomegaly. Left lung clear. Patchy airspace disease throughout  the right lung, stable. Small right pleural effusion. No pneumothorax. No acute bony abnormality. IMPRESSION: Patchy right lung airspace disease, stable. Small right pleural effusion. Electronically Signed   By: Rolm Baptise M.D.   On: 04/30/2019 03:31   Dg Chest Port 1 View  Result Date: 04/29/2019 CLINICAL DATA:  Respiratory failure EXAM: PORTABLE CHEST 1 VIEW COMPARISON:  04/23/2019 FINDINGS: The endotracheal tube terminates above the carina and at the thoracic inlet. The enteric tubes extend below the left hemidiaphragm. The heart size remains enlarged. There are streaky, hazy bilateral airspace opacities, most notably in the right upper lobe. There is no pneumothorax. A small right-sided pleural effusion is suspected. IMPRESSION: 1. Lines and tubes as above. 2. Persistent interstitial persistent hazy bilateral airspace opacities, greatest within the  right upper lobe. Electronically Signed   By: Constance Holster M.D.   On: 04/29/2019 06:09   Dg Chest Port 1 View  Result Date: 04/20/2019 CLINICAL DATA:  Hypoxia EXAM: PORTABLE CHEST 1 VIEW COMPARISON:  04/17/2019 FINDINGS: Cardiac shadow is enlarged but stable. Right jugular catheter has been removed in the interval. The inspiratory effort is poor with patchy parenchymal opacities within both lungs but worst in the right upper lobe consistent with multifocal pneumonia. No bony abnormality is noted. IMPRESSION: Patchy multifocal opacities consistent with pneumonia. Electronically Signed   By: Inez Catalina M.D.   On: 04/20/2019 01:10   Portable Chest 1 View  Result Date: 04/17/2019 CLINICAL DATA:  38 year old female with fever. EXAM: PORTABLE CHEST 1 VIEW COMPARISON:  Chest radiograph dated 08/12/2018 FINDINGS: Right IJ central venous line with tip over right atrium close to the cavoatrial junction. There is mild cardiomegaly with mild vascular congestion. Scattered nodularity may represent congestion although atypical infection is not excluded.  Clinical correlation is recommended. No focal consolidation, pleural effusion, or pneumothorax. No acute osseous pathology. IMPRESSION: 1. Cardiomegaly with mild vascular congestion. Pneumonia is not excluded. Clinical correlation is recommended. 2. Right IJ central venous line with tip over right atrium. Electronically Signed   By: Anner Crete M.D.   On: 04/17/2019 01:05   Ct Extremity Lower Right W Contrast  Result Date: 04/20/2019 CLINICAL DATA:  Shock with right hip pain. EXAM: CT OF THE LOWER RIGHT EXTREMITY WITH CONTRAST TECHNIQUE: Multidetector CT imaging of the lower right extremity was performed according to the standard protocol following intravenous contrast administration. COMPARISON:  Right foot MRI 06/08/2018. CT of the right thigh to April 13, 2015 CONTRAST:  28m OMNIPAQUE IOHEXOL 300 MG/ML  SOLN FINDINGS: Bones/Joint/Cartilage No erosion or fracture seen at the hip. Well-circumscribed subchondral cystic change at the knee with joint space narrowing, chronic appearing and attributed to osteoarthritis. Multiple generalized central erosions at the Lisfranc joint and at the great toe interphalangeal joint, also seen on comparison MRI when midfoot erosions were attributed to infection. This could also be neurogenic arthropathy, although no progression or dislocation. HD related amyloid arthropathy is also considered given relative stability. No visible hip joint effusion.  Small knee joint effusion. Ligaments Suboptimally assessed by CT. Muscles and Tendons No visible pyomyositis or clear inter fascial edema. 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 Soft tissues Subcutaneous edema at the plantar foot, calf, MRI lateral thigh. No soft tissue gas. Low-density rounded collection like appearance measuring 2 cm below the fifth MTP joint with limited peripheral enhancement, stable from 2019 and likely pressure related. No adjacent  ulceration is seen. Atherosclerotic calcification, premature. IMPRESSION: 1. Areas of subcutaneous edema in the lateral thigh, calf, and foot which could reflect mild cellulitis. 2. Lisfranc and great toe interphalangeal central erosions with plantar collection beneath the fifth MTP joint, chronic when compared to 2019 foot MRI. 3. 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 No pyomyositis or other deep space infection identified. 4. 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 Electronically Signed   By: JMonte FantasiaM.D.   On: 04/20/2019 04:18   Ct Image Guided Fluid Drain By Catheter  Result Date: 04/25/2019 INDICATION: 38year old with bacteremia and looking for infection source. Patient has fluid in the right iliopsoas bursa. Plan for image guided aspiration or drainage of the right iliopsoas fluid collection. EXAM: CT-GUIDED DRAIN PLACEMENT IN RIGHT ILIOPSOAS BURSA MEDICATIONS: No antibiotics  given for this procedure. ANESTHESIA/SEDATION: Fentanyl 50 mcg COMPLICATIONS: None immediate. PROCEDURE: Informed consent was obtained for a CT-guided aspiration or drainage. Patient was placed supine on the CT scanner and images through the pelvis were obtained. Right iliopsoas bursa was identified. Overlying skin was prepped with chlorhexidine and sterile field was created. 18 gauge trocar needle was directed into the fluid collection with CT guidance. 5 mL yellow purulent fluid was removed. Stiff Amplatz wire was advanced into the collection and the tract was dilated to accommodate a 10.2 [place] multipurpose drain. Small amount of additional bloody fluid was removed from the collection. Catheter was sutured to skin and attached to a suction bulb. FINDINGS: Fluid in the right iliopsoas bursa. Yellow purulent fluid was removed from the bursa. Bursa was partially decompressed following drain placement. IMPRESSION: CT-guided drain placement within the right iliopsoas bursa abscess.  Electronically Signed   By: Markus Daft M.D.   On: 04/25/2019 21:02   Vas [place] Upper Extremity Venous Duplex  Result Date: 04/27/2019 UPPER VENOUS STUDY  Indications: Pain, and Swelling Comparison Study: no prior Performing Technologist: Abram Sander RVS  Examination Guidelines: A complete evaluation includes B-mode imaging, spectral Doppler, color Doppler, and power Doppler as needed of all accessible portions of each vessel. Bilateral testing is considered an integral part of a complete examination. Limited examinations for reoccurring indications may be performed as noted.  Right Findings: +----------+------------+---------+-----------+----------+--------------+ RIGHT     CompressiblePhasicitySpontaneousProperties   Summary     +----------+------------+---------+-----------+----------+--------------+ IJV                                                 Not visualized +----------+------------+---------+-----------+----------+--------------+ Subclavian    Full                                                 +----------+------------+---------+-----------+----------+--------------+ Axillary      Full                                                 +----------+------------+---------+-----------+----------+--------------+ Brachial      Full                                                 +----------+------------+---------+-----------+----------+--------------+ Radial        Full                                                 +----------+------------+---------+-----------+----------+--------------+ Ulnar         Full                                                 +----------+------------+---------+-----------+----------+--------------+ Cephalic      Full                                                 +----------+------------+---------+-----------+----------+--------------+  Basilic       Full                                                  +----------+------------+---------+-----------+----------+--------------+  Summary:  Right: No evidence of deep vein thrombosis in the upper extremity. No evidence of superficial vein thrombosis in the upper extremity. No evidence of thrombosis in the subclavian.  *See table(s) above for measurements and observations.  Diagnosing physician: Ruta Hinds MD Electronically signed by Ruta Hinds MD on 04/27/2019 at 9:42:40 AM.    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 Diagnostic lumbar puncture requested. EXAM: DIAGNOSTIC LUMBAR PUNCTURE UNDER FLUOROSCOPIC GUIDANCE FLUOROSCOPY TIME:  Fluoroscopy Time:  0 minutes 12 seconds Radiation Exposure Index (if provided by the fluoroscopic device): Number of Acquired Spot Images: 0 PROCEDURE: Informed consent was obtained from the patient's sister by telephone prior to the procedure, including potential complications of headache, allergy, and pain. A "time-out" was performed. With the patient prone, the lower back was prepped with Betadine. 1% Lidocaine was used for local anesthesia. Lumbar puncture was performed at the L2-L3 level using left sub laminar technique and a 5 inch x 20 gauge needle with return of slightly cloudy CSF with an elevated opening pressure of 41.5 cm water. 22 mL of CSF were obtained for laboratory studies. The needle was withdrawn, direct pressure held and hemostasis noted. The patient tolerated the procedure well and there were no apparent complications. The patient was returned to the inpatient floor in stable condition for continued treatment. IMPRESSION: 1. Fluoroscopic guided lumbar puncture at the L2-L3 level with 22 mL of slightly cloudy CSF obtained for analysis. 2. Elevated opening pressure of 41.5 cm of water, although perhaps this measurement  might be affected by the patient's prone positioning as well as mechanical ventilation. Electronically Signed   By: Genevie Ann M.D.   On: 04/29/2019 16:45    Microbiology Recent Results (from the past 240 hour(s))  Aerobic/Anaerobic Culture (surgical/deep wound)     Status: None   Collection Time: 04/25/19 10:37 AM   Specimen: Abscess  Result Value Ref Range Status   Specimen Description ABSCESS RIGHT ILIOPSOAS  Final   Special Requests NONE  Final   Gram Stain   Final    RARE WBC PRESENT, PREDOMINANTLY PMN NO ORGANISMS SEEN    Culture   Final    RARE METHICILLIN RESISTANT STAPHYLOCOCCUS AUREUS NO ANAEROBES ISOLATED Performed at [facility], [address], [place], [place] [address]    Report Status 04/30/2019 FINAL  Final   Organism ID, Bacteria METHICILLIN RESISTANT STAPHYLOCOCCUS AUREUS  Final      Susceptibility   Methicillin resistant staphylococcus aureus - MIC*    CIPROFLOXACIN >=8 RESISTANT Resistant     ERYTHROMYCIN >=8 RESISTANT Resistant     GENTAMICIN <=0.5 SENSITIVE Sensitive     OXACILLIN >=4 RESISTANT Resistant     TETRACYCLINE <=1 SENSITIVE Sensitive     VANCOMYCIN <=0.5 SENSITIVE Sensitive     TRIMETH/SULFA <=10 SENSITIVE Sensitive     CLINDAMYCIN <=0.25 SENSITIVE Sensitive     RIFAMPIN <=0.5 SENSITIVE Sensitive     Inducible Clindamycin NEGATIVE Sensitive     * RARE  METHICILLIN RESISTANT STAPHYLOCOCCUS AUREUS  CSF culture with Stat gram stain     Status: None   Collection Time: 04/29/19  4:23 PM   Specimen: CSF; Cerebrospinal Fluid  Result Value Ref Range Status   Specimen Description CSF  Final   Special Requests NONE  Final   Gram Stain   Final    WBC PRESENT,BOTH PMN AND MONONUCLEAR NO ORGANISMS SEEN CYTOSPIN SMEAR    Culture   Final    NO GROWTH 3 DAYS Performed at [facility], [address] [place], [place] [address]    Report Status 05/03/2019 FINAL  Final    Lab Basic Metabolic Panel: Recent Labs  Lab 04/27/19 1847  04/28/19 0448 04/28/19 1436 04/29/19 0310 04/29/19 1637 04/30/19 0223 04/30/19 0225 04/30/19 0341 04/30/19 0432 04/30/19 0537 04/30/19 1845  NA 132* 137 138 134* 134* 137 134* 135 134* 135 134*  K 3.6 3.7 3.8 4.0 4.5 5.1 4.9 4.5 5.3* 4.7 3.6  CL 95* 100 101 97* 96* 92*  --   --   --   --  93*  CO2 _0 --   --   --   --  25  GLUCOSE 159* 165* 164* 192* 137* 172*  --   --   --   --  97  BUN 31* 35* 40* 42* 46* 52*  --   --   --   --  28*  CREATININE 4.31* 4.65* 5.04* 5.07* 5.60* 6.08*  --   --   --   --  3.88*  CALCIUM 8.1* 8.2* 8.3* 8.4* 8.6* 9.0  --   --   --   --  8.5*  MG 2.1 2.1  --  2.2  --  2.9*  --   --   --   --   --   PHOS 3.9 3.2  3.1 3.0 2.4* 2.4* 6.0*  --   --   --   --  3.0   Liver Function Tests: Recent Labs  Lab 04/29/19 0310 04/29/19 1637 04/30/19 0223 04/30/19 0249 04/30/19 1845  AST  --   --   --  242*  --   ALT  --   --   --  68*  --   ALKPHOS  --   --   --  720*  --   BILITOT  --   --   --  1.8*  --   PROT  --   --   --  6.2*  --   ALBUMIN 1.3* 1.3* 1.2* 1.1* 1.6*   No results for input(s): LIPASE, AMYLASE in the last 168 hours. Recent Labs  Lab 04/30/19 0258  AMMONIA 113*   CBC: Recent Labs  Lab 04/28/19 0448 04/29/19 0310 04/30/19 0225 04/30/19 0257 04/30/19 0341 04/30/19 0432 04/30/19 0537  WBC 8.3 7.9  --  14.3*  --   --   --   NEUTROABS  --  6.1  --   --   --   --   --   HGB 8.4* 8.1* 9.2* 8.4* 11.2* 8.5* 8.8*  HCT 27.2* 26.4* 27.0* 28.4* 33.0* 25.0* 26.0*  MCV 95.8 96.0  --  101.4*  --   --   --   PLT 126* 120*  --  133*  --   --   --    Cardiac Enzymes: No results for input(s): CKTOTAL, CKMB, CKMBINDEX, TROPONINI in the last 168 hours. Sepsis Labs: Recent Labs  Lab  04/28/19 0448 04/29/19 0310 04/30/19 0247 04/30/19 0257 04/30/19 0835  WBC 8.3 7.9  --  14.3*  --   LATICACIDVEN  --   --  9.5*  --  1.6    Procedures/Operations     Jennet Maduro 05/04/2019, 12:38 PM

## 2019-05-25 DEATH — deceased

## 2019-05-26 ENCOUNTER — Telehealth: Payer: Self-pay

## 2019-05-26 NOTE — Telephone Encounter (Signed)
05/26/19 Recd DC, sending to [facility] for Dr. Nelda Marseille to sign.   05/28/19 Recd signed DC from Dr. Halford Chessman. Called [facility] for pickup.

## 2020-08-17 IMAGING — CR DG CHEST 2V
2 series · 2 of 2 positions shown · non-contrast
Comparison: None.

CLINICAL DATA: Congestive heart failure.

EXAM:
CHEST - 2 VIEW

[chest pa]
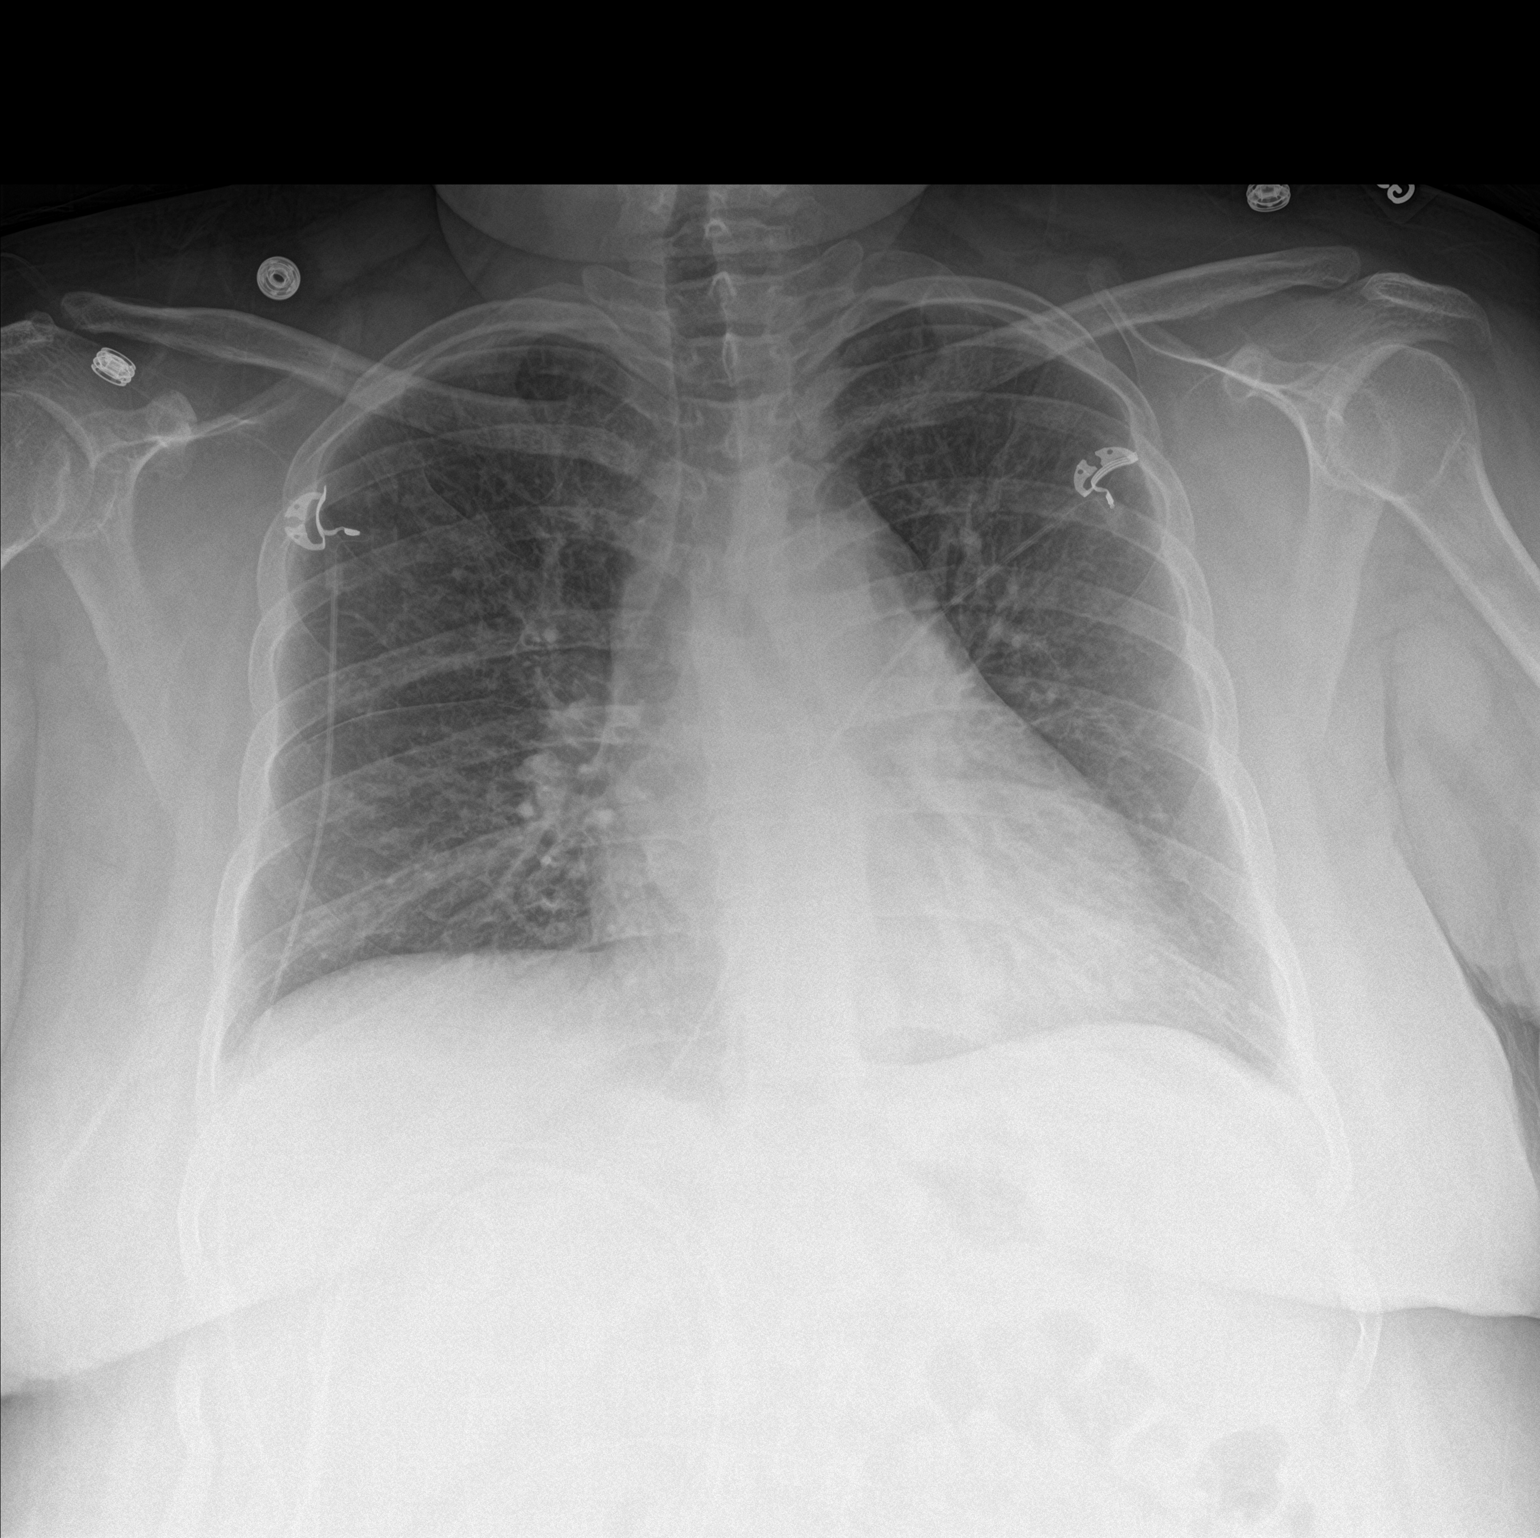

[chest lat]
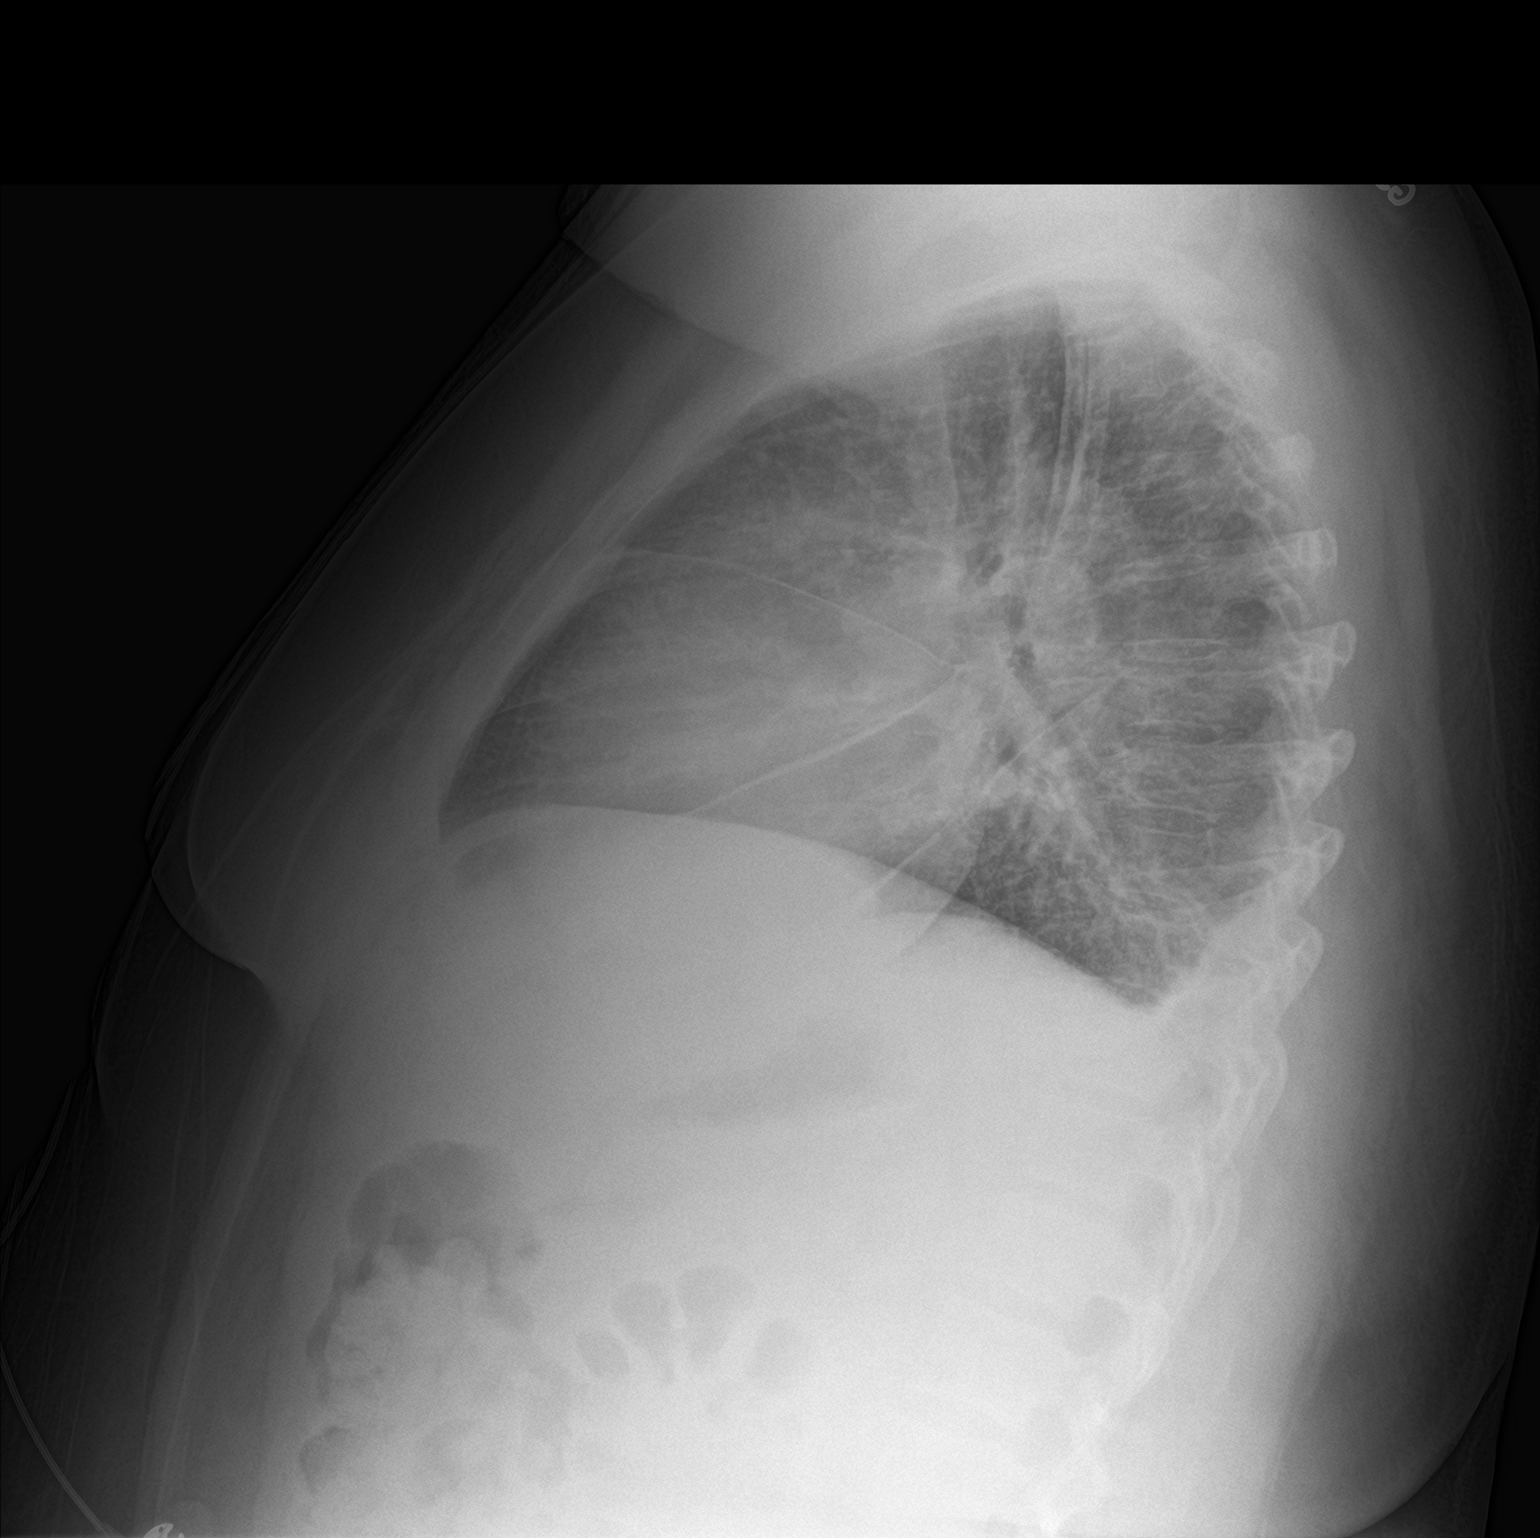

[2 of 2 positions shown; findings below may reference images not displayed]

FINDINGS: Heart size is normal. There is pulmonary venous hypertension with
mild interstitial edema, fluid in the fissures and a small amount of
fluid in the posterior costophrenic angles. No consolidation or
collapse. No significant bone finding.
IMPRESSION: Congestive heart failure pattern with pulmonary venous hypertension,
interstitial edema and a small pleural fluid.

## 2020-08-19 IMAGING — CR DG FOOT COMPLETE 3+V*L*
3 series · 3 of 3 positions shown · non-contrast
Comparison: None.

CLINICAL DATA: Possible fifth digit fracture with pain.

EXAM:
LEFT FOOT - COMPLETE 3+ VIEW

[foot ap]
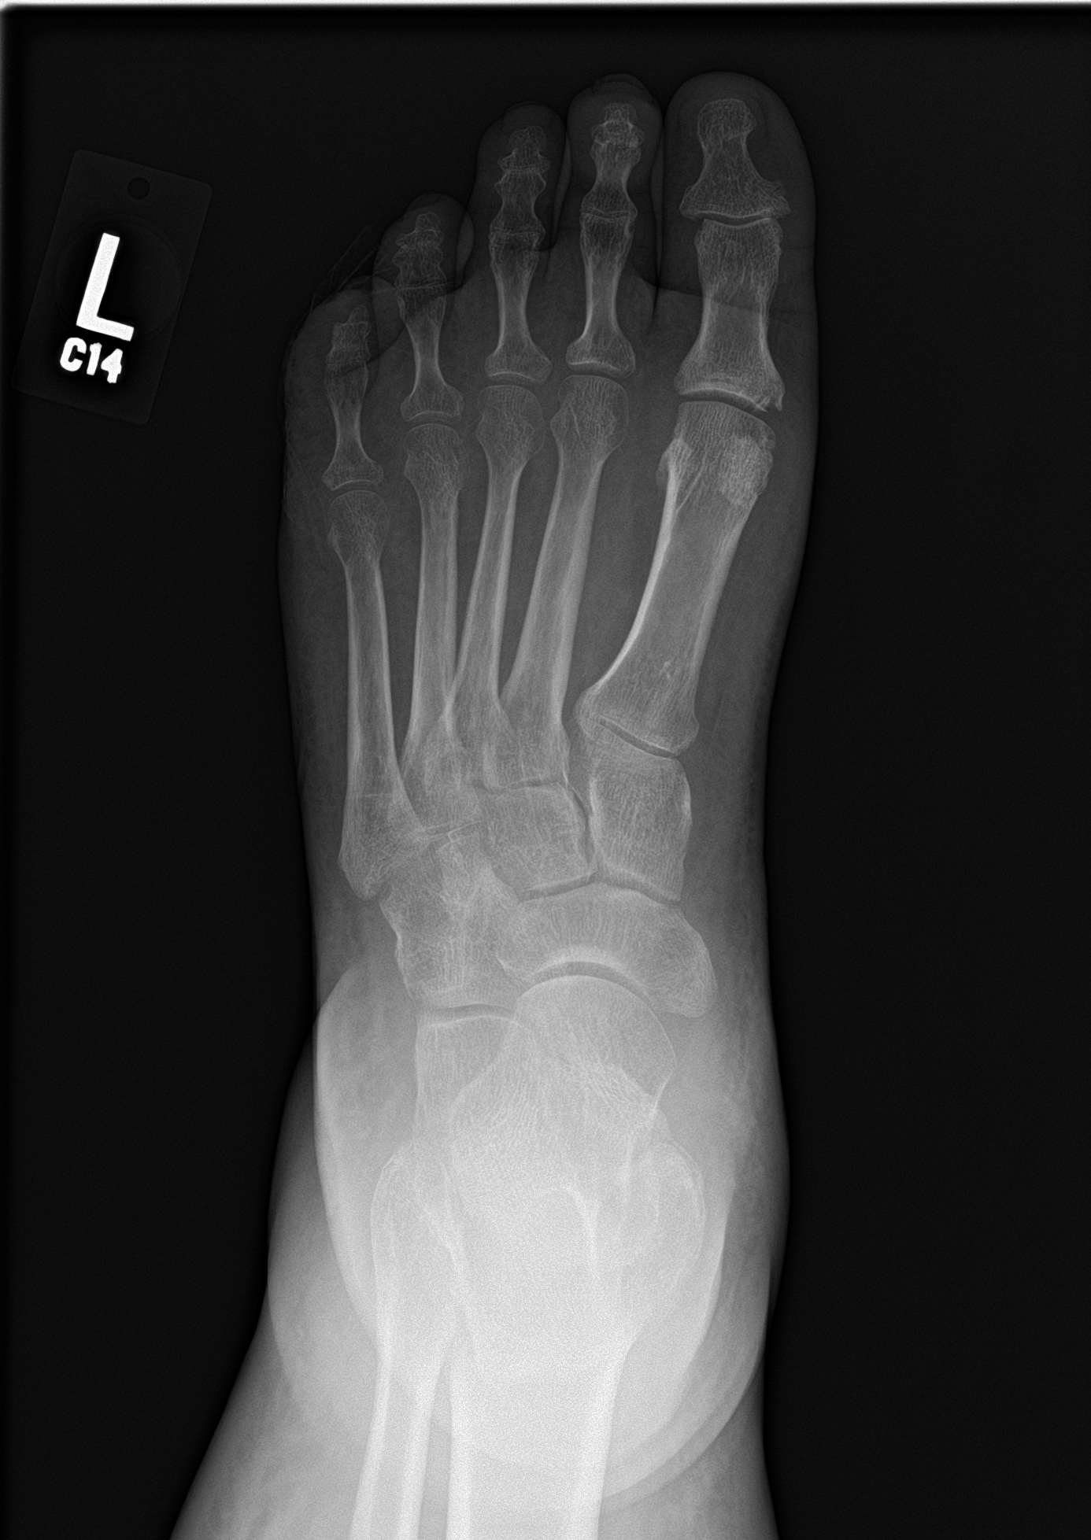

[foot obl]
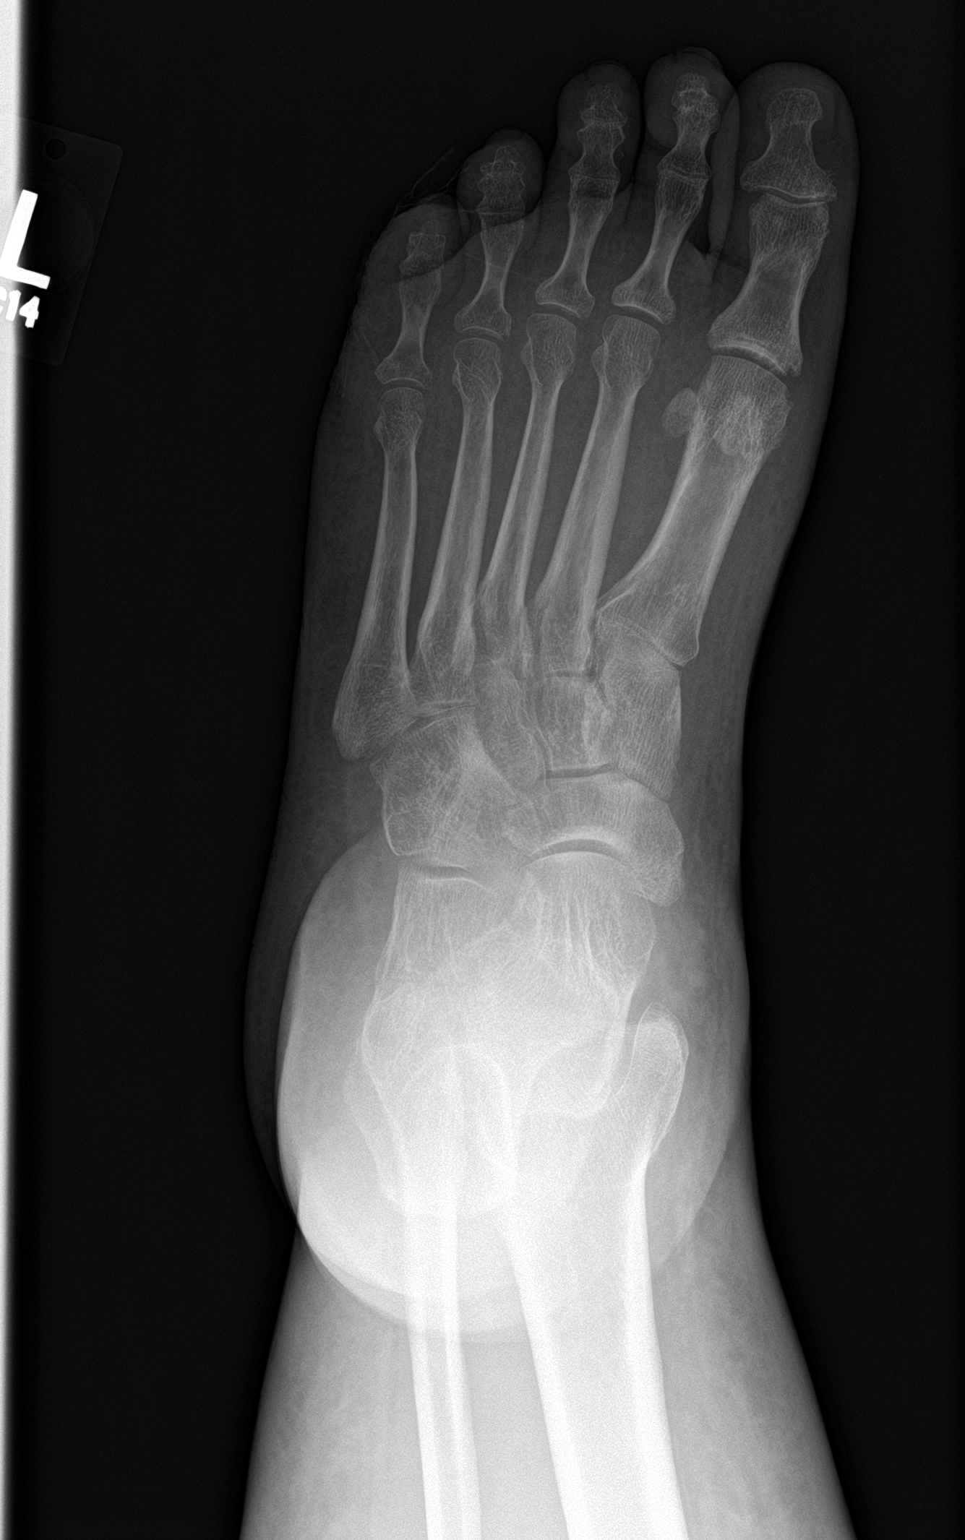

[foot lat]
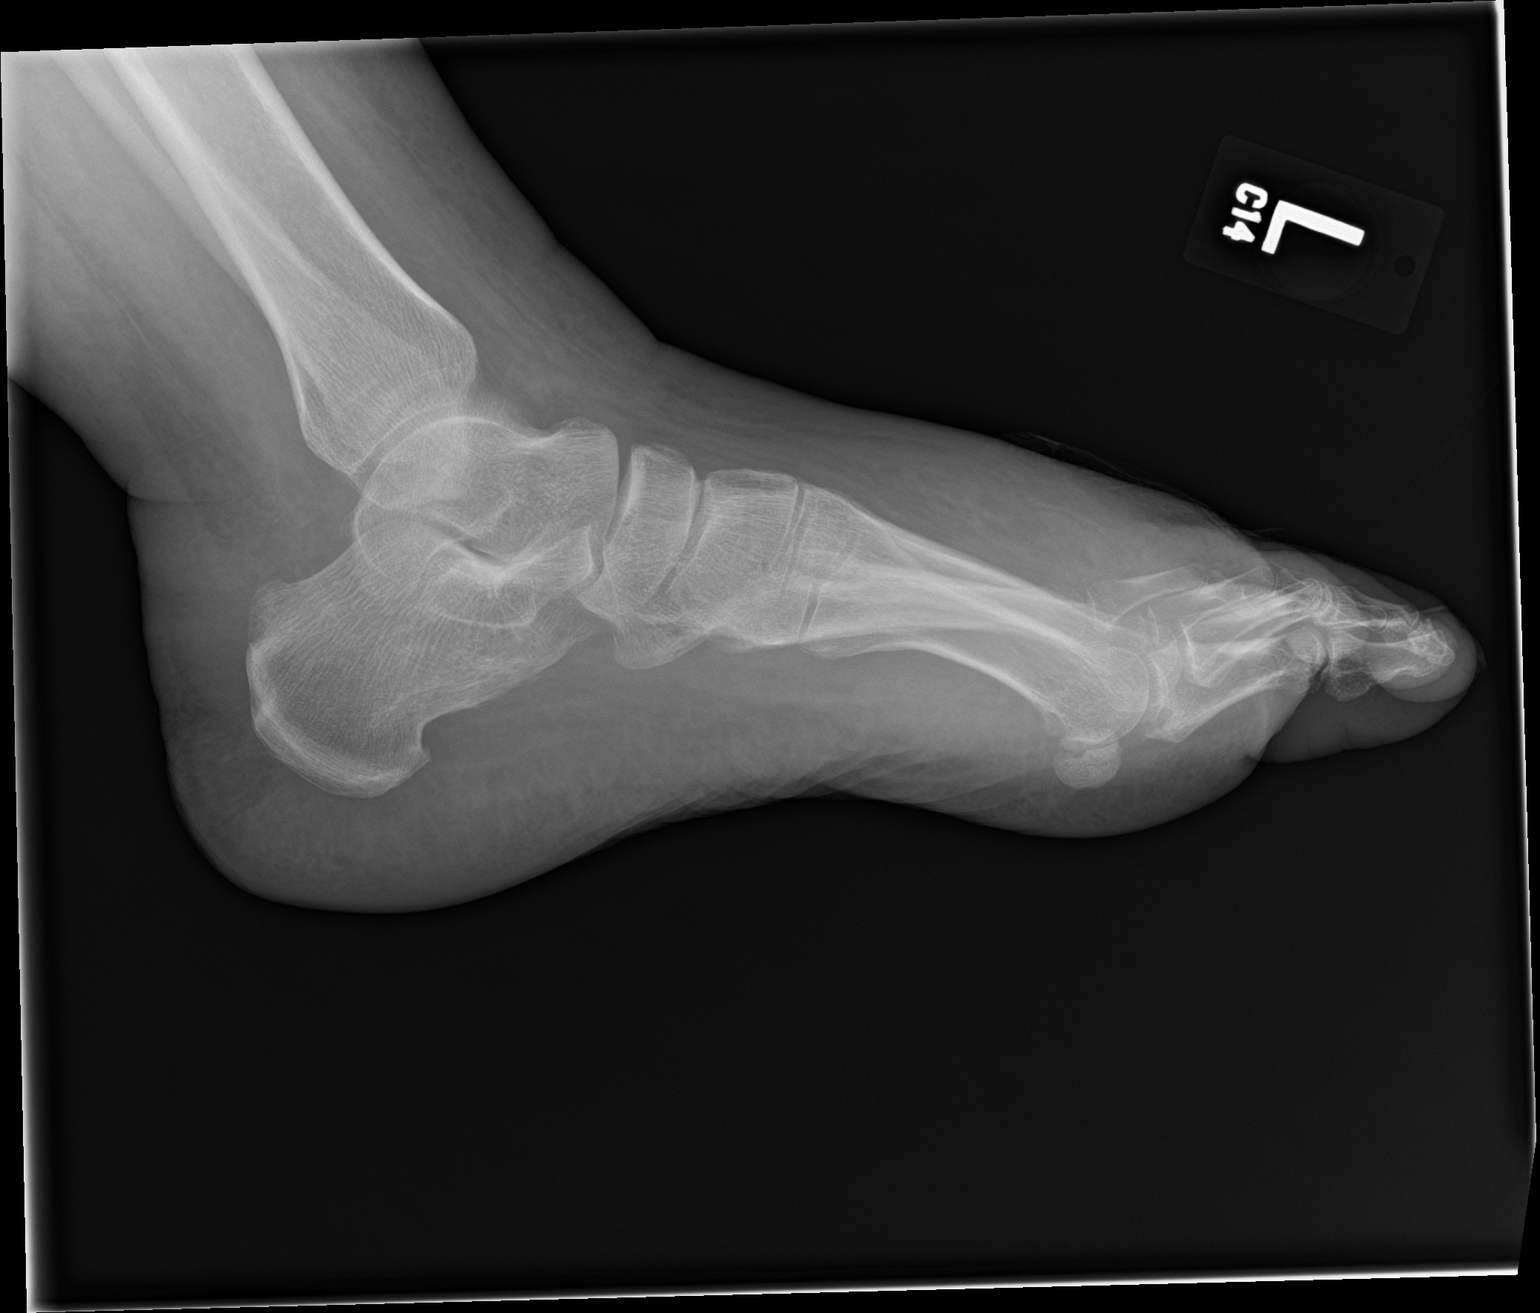

[3 of 3 positions shown; findings below may reference images not displayed]

FINDINGS: Irregularity of the fifth digit skin which may be related to history
of bandage. There is no soft tissue emphysema, opaque foreign body,
erosion, or fracture. Dorsal soft tissue swelling of the forefoot.
Osteopenia
IMPRESSION: No acute osseous finding.

## 2020-11-04 IMAGING — DX DG CHEST 2V
2 series · 2 of 2 positions shown · non-contrast
Comparison: Chest radiograph June 06, 2018

CLINICAL DATA: Shortness of breath with exertion, orthopnea.
History of diabetes, hypertension and CHF.

EXAM:
CHEST - 2 VIEW

[chest pa]
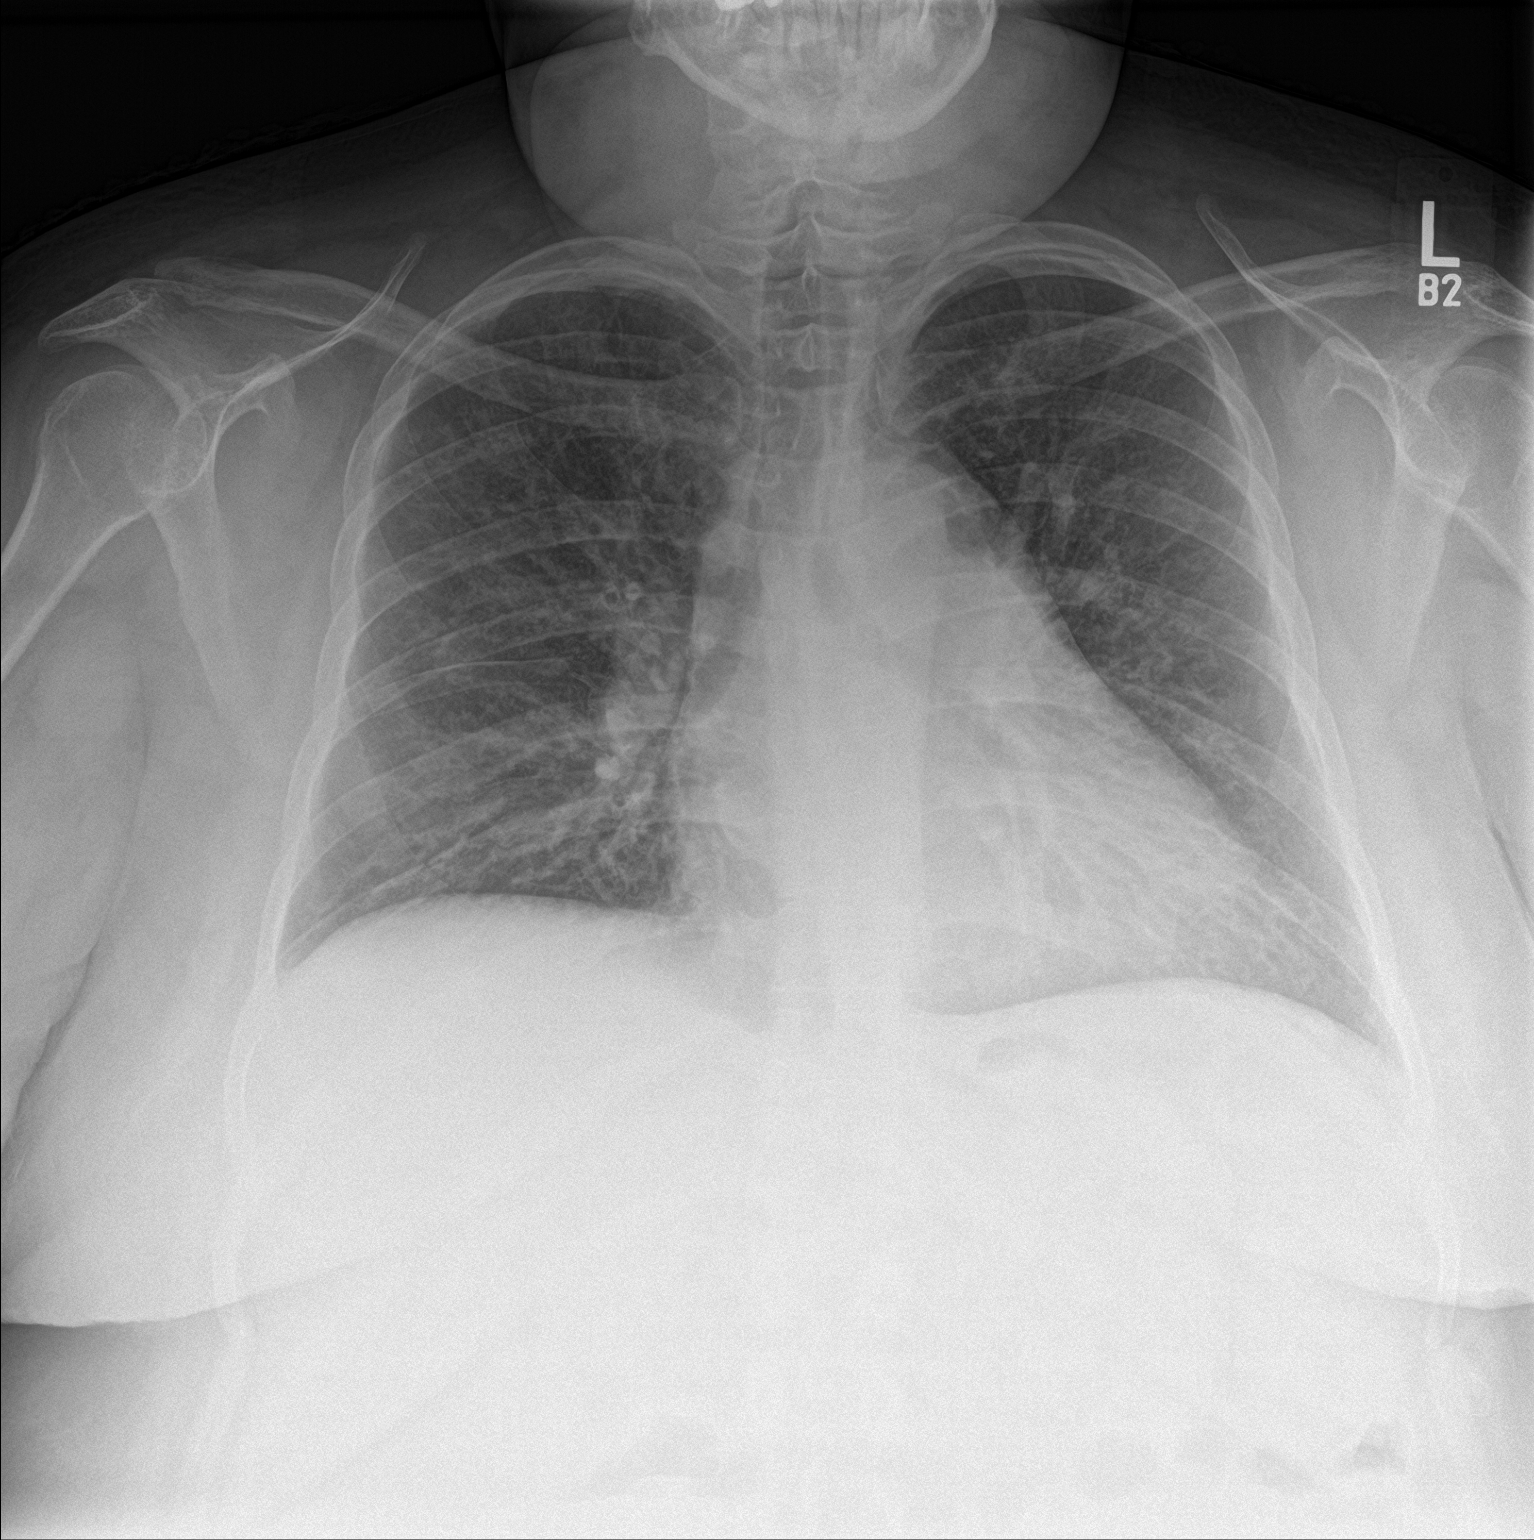

[chest lat]
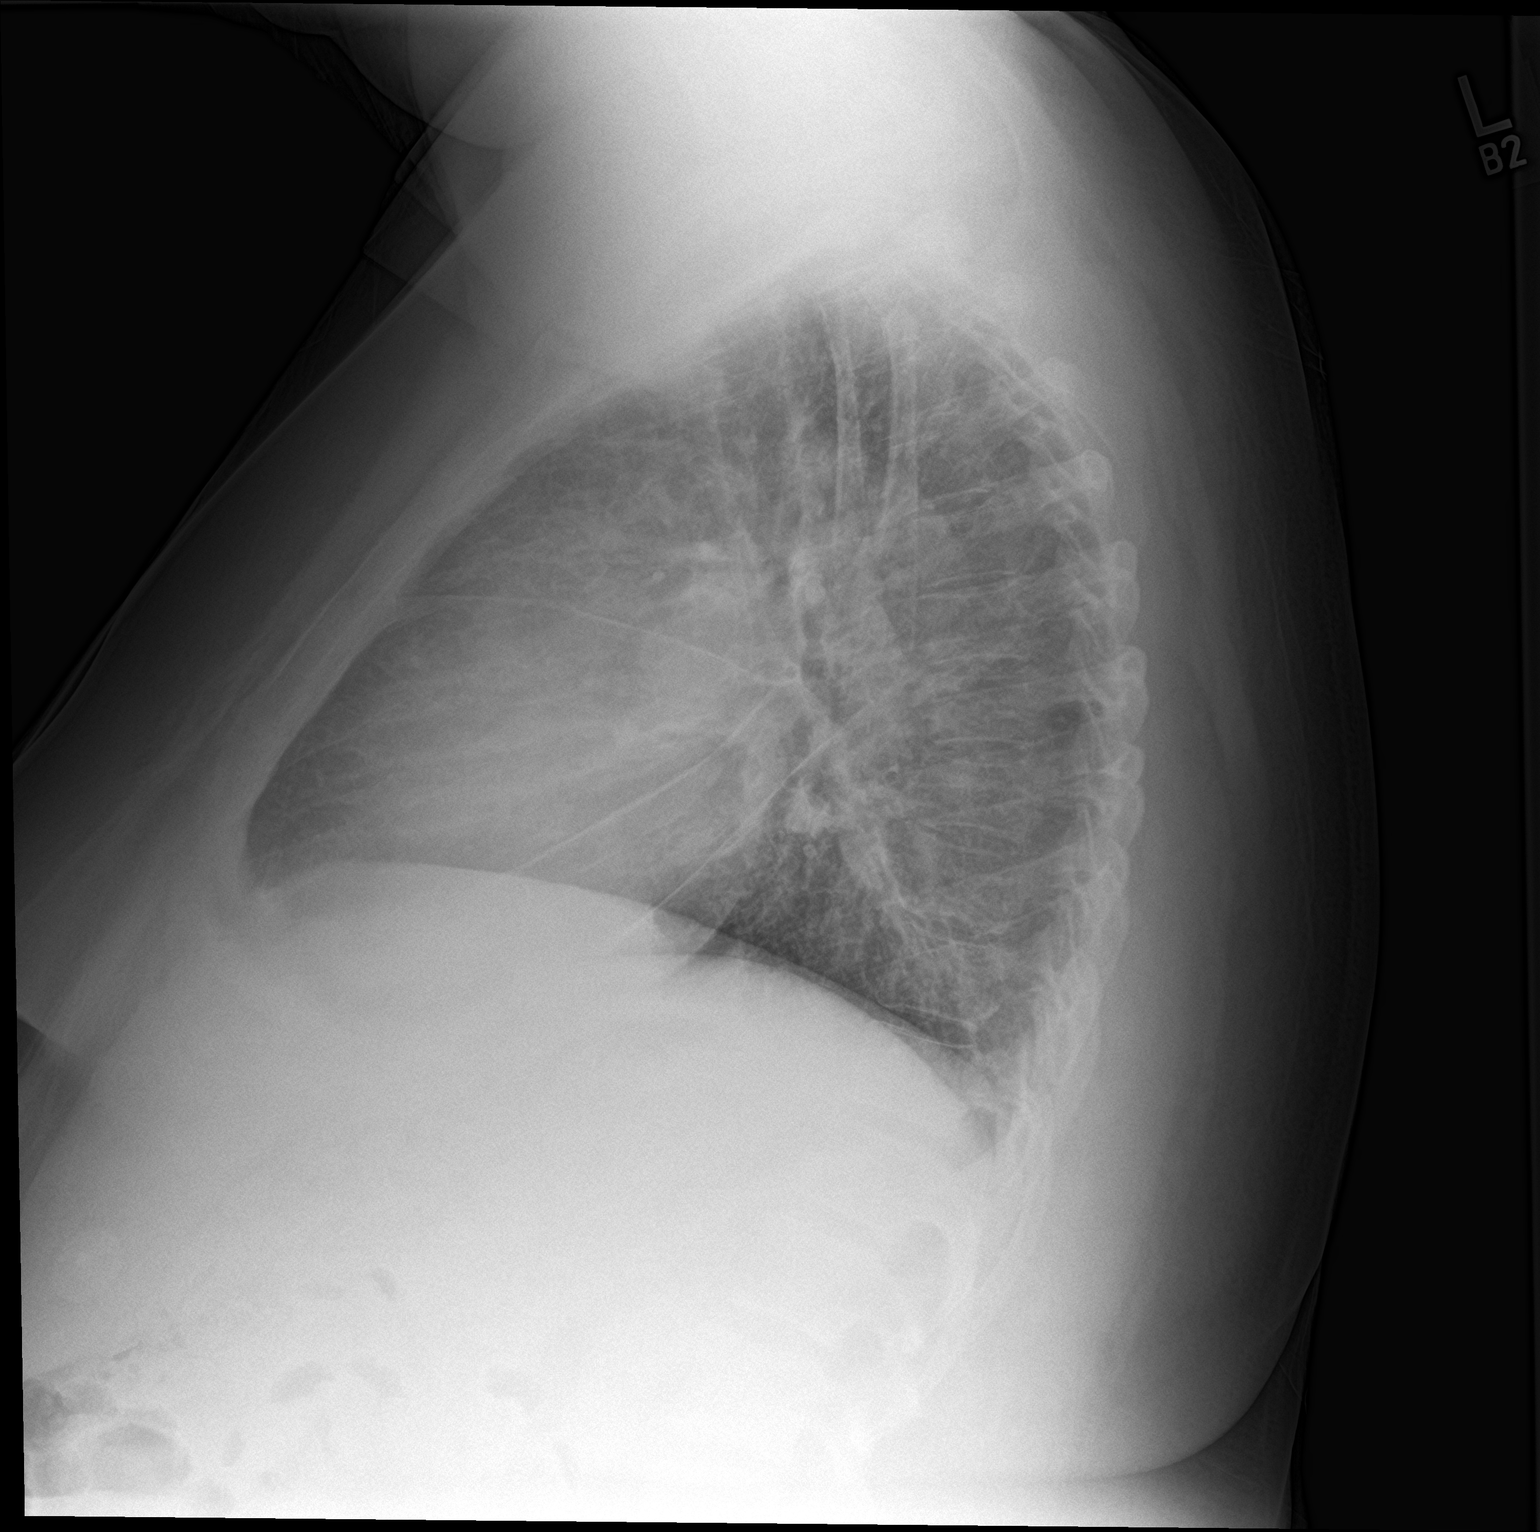

[2 of 2 positions shown; findings below may reference images not displayed]

FINDINGS: Cardiac silhouette is upper limits of normal in size. Mediastinal
silhouette is not suspicious. Interstitial prominence without
pleural effusion or focal consolidation. No pneumothorax. Large body
habitus. Osseous structures are nonacute.
IMPRESSION: 1. Increased interstitial prominence seen with pulmonary edema or
atypical infection.
2. Borderline cardiomegaly.
